# Patient Record
Sex: Female | Born: 1955 | Race: Black or African American | Hispanic: No | Smoking: Never smoker
Health system: Southern US, Community
[De-identification: ages and names within clinical notes are randomized; demographics above are authoritative.]

## PROBLEM LIST (undated history)

## (undated) ENCOUNTER — Ambulatory Visit (HOSPITAL_COMMUNITY): Source: Home / Self Care

## (undated) DIAGNOSIS — IMO0001 Reserved for inherently not codable concepts without codable children: Secondary | ICD-10-CM

## (undated) DIAGNOSIS — F329 Major depressive disorder, single episode, unspecified: Secondary | ICD-10-CM

## (undated) DIAGNOSIS — I2510 Atherosclerotic heart disease of native coronary artery without angina pectoris: Secondary | ICD-10-CM

## (undated) DIAGNOSIS — I219 Acute myocardial infarction, unspecified: Secondary | ICD-10-CM

## (undated) DIAGNOSIS — I10 Essential (primary) hypertension: Secondary | ICD-10-CM

## (undated) DIAGNOSIS — Z531 Procedure and treatment not carried out because of patient's decision for reasons of belief and group pressure: Secondary | ICD-10-CM

## (undated) DIAGNOSIS — K922 Gastrointestinal hemorrhage, unspecified: Secondary | ICD-10-CM

## (undated) DIAGNOSIS — F32A Depression, unspecified: Secondary | ICD-10-CM

## (undated) HISTORY — PX: BACK SURGERY: SHX140

## (undated) HISTORY — PX: ABDOMINAL HYSTERECTOMY: SHX81

---

## 2004-04-09 ENCOUNTER — Inpatient Hospital Stay (HOSPITAL_COMMUNITY): Admission: EM | Admit: 2004-04-09 | Discharge: 2004-04-10 | Payer: Self-pay | Admitting: *Deleted

## 2004-04-10 ENCOUNTER — Inpatient Hospital Stay (HOSPITAL_COMMUNITY): Admission: RE | Admit: 2004-04-10 | Discharge: 2004-04-13 | Payer: Self-pay | Admitting: Psychiatry

## 2004-04-10 ENCOUNTER — Encounter: Payer: Self-pay | Admitting: Cardiology

## 2004-04-16 ENCOUNTER — Ambulatory Visit (HOSPITAL_COMMUNITY): Admission: RE | Admit: 2004-04-16 | Discharge: 2004-04-16 | Payer: Self-pay | Admitting: Cardiovascular Disease

## 2004-04-19 ENCOUNTER — Emergency Department (HOSPITAL_COMMUNITY): Admission: EM | Admit: 2004-04-19 | Discharge: 2004-04-19 | Payer: Self-pay | Admitting: Emergency Medicine

## 2004-05-15 ENCOUNTER — Ambulatory Visit: Payer: Self-pay | Admitting: Internal Medicine

## 2004-05-22 ENCOUNTER — Ambulatory Visit (HOSPITAL_COMMUNITY): Admission: RE | Admit: 2004-05-22 | Discharge: 2004-05-22 | Payer: Self-pay | Admitting: Family Medicine

## 2004-07-02 ENCOUNTER — Emergency Department (HOSPITAL_COMMUNITY): Admission: EM | Admit: 2004-07-02 | Discharge: 2004-07-03 | Payer: Self-pay | Admitting: Emergency Medicine

## 2004-08-12 ENCOUNTER — Encounter: Admission: RE | Admit: 2004-08-12 | Discharge: 2004-08-12 | Payer: Self-pay | Admitting: Family Medicine

## 2004-11-05 ENCOUNTER — Encounter: Admission: RE | Admit: 2004-11-05 | Discharge: 2005-02-03 | Payer: Self-pay | Admitting: Orthopaedic Surgery

## 2004-12-25 ENCOUNTER — Emergency Department (HOSPITAL_COMMUNITY): Admission: EM | Admit: 2004-12-25 | Discharge: 2004-12-25 | Payer: Self-pay | Admitting: Emergency Medicine

## 2005-02-24 ENCOUNTER — Ambulatory Visit (HOSPITAL_COMMUNITY): Admission: RE | Admit: 2005-02-24 | Discharge: 2005-02-24 | Payer: Self-pay | Admitting: Gastroenterology

## 2005-03-11 ENCOUNTER — Encounter
Admission: RE | Admit: 2005-03-11 | Discharge: 2005-06-09 | Payer: Self-pay | Admitting: Physical Medicine and Rehabilitation

## 2005-03-11 ENCOUNTER — Ambulatory Visit: Payer: Self-pay | Admitting: Physical Medicine and Rehabilitation

## 2005-04-08 ENCOUNTER — Ambulatory Visit: Payer: Self-pay | Admitting: Physical Medicine & Rehabilitation

## 2005-05-06 ENCOUNTER — Encounter
Admission: RE | Admit: 2005-05-06 | Discharge: 2005-06-21 | Payer: Self-pay | Admitting: Physical Medicine and Rehabilitation

## 2005-05-31 ENCOUNTER — Emergency Department (HOSPITAL_COMMUNITY): Admission: EM | Admit: 2005-05-31 | Discharge: 2005-05-31 | Payer: Self-pay | Admitting: Emergency Medicine

## 2005-06-04 ENCOUNTER — Ambulatory Visit: Payer: Self-pay | Admitting: Physical Medicine and Rehabilitation

## 2005-07-06 ENCOUNTER — Encounter
Admission: RE | Admit: 2005-07-06 | Discharge: 2005-10-04 | Payer: Self-pay | Admitting: Physical Medicine and Rehabilitation

## 2005-07-15 ENCOUNTER — Ambulatory Visit: Payer: Self-pay | Admitting: Physical Medicine and Rehabilitation

## 2005-07-16 ENCOUNTER — Ambulatory Visit (HOSPITAL_COMMUNITY)
Admission: RE | Admit: 2005-07-16 | Discharge: 2005-07-16 | Payer: Self-pay | Admitting: Physical Medicine and Rehabilitation

## 2005-07-29 ENCOUNTER — Encounter
Admission: RE | Admit: 2005-07-29 | Discharge: 2005-08-30 | Payer: Self-pay | Admitting: Physical Medicine and Rehabilitation

## 2005-08-13 ENCOUNTER — Encounter: Admission: RE | Admit: 2005-08-13 | Discharge: 2005-08-13 | Payer: Self-pay | Admitting: Internal Medicine

## 2005-08-14 ENCOUNTER — Ambulatory Visit (HOSPITAL_COMMUNITY)
Admission: RE | Admit: 2005-08-14 | Discharge: 2005-08-14 | Payer: Self-pay | Admitting: Physical Medicine and Rehabilitation

## 2005-08-26 ENCOUNTER — Ambulatory Visit: Payer: Self-pay | Admitting: Physical Medicine and Rehabilitation

## 2005-08-29 ENCOUNTER — Ambulatory Visit (HOSPITAL_COMMUNITY)
Admission: RE | Admit: 2005-08-29 | Discharge: 2005-08-29 | Payer: Self-pay | Admitting: Physical Medicine and Rehabilitation

## 2005-10-05 ENCOUNTER — Encounter
Admission: RE | Admit: 2005-10-05 | Discharge: 2006-01-03 | Payer: Self-pay | Admitting: Physical Medicine and Rehabilitation

## 2005-10-05 ENCOUNTER — Ambulatory Visit: Payer: Self-pay | Admitting: Physical Medicine and Rehabilitation

## 2005-11-08 ENCOUNTER — Ambulatory Visit: Payer: Self-pay | Admitting: Physical Medicine and Rehabilitation

## 2005-12-14 ENCOUNTER — Encounter
Admission: RE | Admit: 2005-12-14 | Discharge: 2006-01-06 | Payer: Self-pay | Admitting: Physical Medicine and Rehabilitation

## 2005-12-31 ENCOUNTER — Encounter
Admission: RE | Admit: 2005-12-31 | Discharge: 2006-03-31 | Payer: Self-pay | Admitting: Physical Medicine and Rehabilitation

## 2006-01-20 ENCOUNTER — Ambulatory Visit: Payer: Self-pay | Admitting: Physical Medicine and Rehabilitation

## 2006-01-27 ENCOUNTER — Encounter
Admission: RE | Admit: 2006-01-27 | Discharge: 2006-04-27 | Payer: Self-pay | Admitting: Physical Medicine and Rehabilitation

## 2006-03-15 ENCOUNTER — Ambulatory Visit: Payer: Self-pay | Admitting: Physical Medicine and Rehabilitation

## 2006-03-24 ENCOUNTER — Emergency Department (HOSPITAL_COMMUNITY): Admission: EM | Admit: 2006-03-24 | Discharge: 2006-03-24 | Payer: Self-pay | Admitting: Internal Medicine

## 2006-04-22 ENCOUNTER — Encounter: Admission: RE | Admit: 2006-04-22 | Discharge: 2006-07-21 | Payer: Self-pay | Admitting: Anesthesiology

## 2006-05-24 ENCOUNTER — Encounter
Admission: RE | Admit: 2006-05-24 | Discharge: 2006-08-22 | Payer: Self-pay | Admitting: Physical Medicine and Rehabilitation

## 2006-05-24 ENCOUNTER — Ambulatory Visit: Payer: Self-pay | Admitting: Physical Medicine and Rehabilitation

## 2006-05-31 ENCOUNTER — Ambulatory Visit (HOSPITAL_COMMUNITY)
Admission: RE | Admit: 2006-05-31 | Discharge: 2006-05-31 | Payer: Self-pay | Admitting: Physical Medicine and Rehabilitation

## 2006-06-15 ENCOUNTER — Ambulatory Visit: Payer: Self-pay | Admitting: Psychology

## 2006-06-15 ENCOUNTER — Encounter
Admission: RE | Admit: 2006-06-15 | Discharge: 2006-09-13 | Payer: Self-pay | Admitting: Physical Medicine and Rehabilitation

## 2006-07-18 ENCOUNTER — Ambulatory Visit: Payer: Self-pay | Admitting: Physical Medicine and Rehabilitation

## 2006-08-20 ENCOUNTER — Emergency Department (HOSPITAL_COMMUNITY): Admission: EM | Admit: 2006-08-20 | Discharge: 2006-08-20 | Payer: Self-pay | Admitting: Emergency Medicine

## 2006-08-25 ENCOUNTER — Encounter
Admission: RE | Admit: 2006-08-25 | Discharge: 2006-11-23 | Payer: Self-pay | Admitting: Physical Medicine and Rehabilitation

## 2006-09-08 ENCOUNTER — Ambulatory Visit: Payer: Self-pay | Admitting: Physical Medicine and Rehabilitation

## 2006-09-27 ENCOUNTER — Ambulatory Visit (HOSPITAL_COMMUNITY): Admission: RE | Admit: 2006-09-27 | Discharge: 2006-09-27 | Payer: Self-pay | Admitting: Neurosurgery

## 2006-10-05 ENCOUNTER — Encounter
Admission: RE | Admit: 2006-10-05 | Discharge: 2007-01-03 | Payer: Self-pay | Admitting: Physical Medicine and Rehabilitation

## 2006-11-29 ENCOUNTER — Ambulatory Visit: Payer: Self-pay | Admitting: Physical Medicine and Rehabilitation

## 2006-12-15 ENCOUNTER — Encounter: Admission: RE | Admit: 2006-12-15 | Discharge: 2006-12-15 | Payer: Self-pay | Admitting: Internal Medicine

## 2006-12-28 ENCOUNTER — Encounter
Admission: RE | Admit: 2006-12-28 | Discharge: 2007-03-28 | Payer: Self-pay | Admitting: Physical Medicine and Rehabilitation

## 2007-01-20 ENCOUNTER — Ambulatory Visit: Payer: Self-pay | Admitting: Physical Medicine and Rehabilitation

## 2007-05-06 ENCOUNTER — Emergency Department (HOSPITAL_COMMUNITY): Admission: EM | Admit: 2007-05-06 | Discharge: 2007-05-06 | Payer: Self-pay | Admitting: Family Medicine

## 2007-12-17 ENCOUNTER — Emergency Department (HOSPITAL_COMMUNITY): Admission: EM | Admit: 2007-12-17 | Discharge: 2007-12-17 | Payer: Self-pay | Admitting: Family Medicine

## 2007-12-20 ENCOUNTER — Encounter
Admission: RE | Admit: 2007-12-20 | Discharge: 2007-12-25 | Payer: Self-pay | Admitting: Physical Medicine and Rehabilitation

## 2007-12-25 ENCOUNTER — Ambulatory Visit: Payer: Self-pay | Admitting: Physical Medicine and Rehabilitation

## 2008-04-06 ENCOUNTER — Emergency Department (HOSPITAL_COMMUNITY): Admission: EM | Admit: 2008-04-06 | Discharge: 2008-04-06 | Payer: Self-pay | Admitting: Emergency Medicine

## 2008-12-25 ENCOUNTER — Emergency Department (HOSPITAL_COMMUNITY): Admission: EM | Admit: 2008-12-25 | Discharge: 2008-12-25 | Payer: Self-pay | Admitting: Emergency Medicine

## 2009-07-08 ENCOUNTER — Encounter (INDEPENDENT_AMBULATORY_CARE_PROVIDER_SITE_OTHER): Payer: Self-pay | Admitting: Internal Medicine

## 2009-07-08 ENCOUNTER — Inpatient Hospital Stay (HOSPITAL_COMMUNITY): Admission: EM | Admit: 2009-07-08 | Discharge: 2009-07-08 | Payer: Self-pay | Admitting: Emergency Medicine

## 2009-07-08 ENCOUNTER — Ambulatory Visit: Payer: Self-pay | Admitting: Cardiology

## 2009-07-09 ENCOUNTER — Telehealth (INDEPENDENT_AMBULATORY_CARE_PROVIDER_SITE_OTHER): Payer: Self-pay | Admitting: *Deleted

## 2009-07-10 ENCOUNTER — Ambulatory Visit: Payer: Self-pay

## 2009-07-10 ENCOUNTER — Encounter (HOSPITAL_COMMUNITY): Admission: RE | Admit: 2009-07-10 | Discharge: 2009-09-11 | Payer: Self-pay | Admitting: Cardiology

## 2009-07-10 ENCOUNTER — Ambulatory Visit: Payer: Self-pay | Admitting: Cardiovascular Disease

## 2009-07-15 ENCOUNTER — Telehealth: Payer: Self-pay | Admitting: Cardiology

## 2009-07-16 ENCOUNTER — Telehealth (INDEPENDENT_AMBULATORY_CARE_PROVIDER_SITE_OTHER): Payer: Self-pay | Admitting: *Deleted

## 2009-11-14 ENCOUNTER — Ambulatory Visit: Payer: Self-pay | Admitting: Vascular Surgery

## 2009-11-14 ENCOUNTER — Encounter (INDEPENDENT_AMBULATORY_CARE_PROVIDER_SITE_OTHER): Payer: Self-pay | Admitting: Internal Medicine

## 2009-11-14 ENCOUNTER — Observation Stay (HOSPITAL_COMMUNITY): Admission: EM | Admit: 2009-11-14 | Discharge: 2009-11-15 | Payer: Self-pay | Admitting: Emergency Medicine

## 2009-11-14 ENCOUNTER — Ambulatory Visit: Payer: Self-pay | Admitting: Cardiovascular Disease

## 2010-04-08 ENCOUNTER — Emergency Department (HOSPITAL_COMMUNITY): Admission: EM | Admit: 2010-04-08 | Discharge: 2010-04-08 | Payer: Self-pay | Admitting: Emergency Medicine

## 2010-09-17 ENCOUNTER — Encounter
Admission: RE | Admit: 2010-09-17 | Discharge: 2010-09-17 | Payer: Self-pay | Source: Home / Self Care | Attending: Internal Medicine | Admitting: Internal Medicine

## 2010-09-20 ENCOUNTER — Emergency Department (HOSPITAL_COMMUNITY)
Admission: EM | Admit: 2010-09-20 | Discharge: 2010-09-20 | Payer: Self-pay | Source: Home / Self Care | Admitting: Emergency Medicine

## 2010-09-28 ENCOUNTER — Encounter
Admission: RE | Admit: 2010-09-28 | Discharge: 2010-09-28 | Payer: Self-pay | Source: Home / Self Care | Attending: Internal Medicine | Admitting: Internal Medicine

## 2010-09-28 LAB — CBC
HCT: 33.8 % — ABNORMAL LOW (ref 36.0–46.0)
Hemoglobin: 10.6 g/dL — ABNORMAL LOW (ref 12.0–15.0)
MCH: 25.4 pg — ABNORMAL LOW (ref 26.0–34.0)
MCHC: 31.4 g/dL (ref 30.0–36.0)
MCV: 81.1 fL (ref 78.0–100.0)
Platelets: 330 10*3/uL (ref 150–400)
RBC: 4.17 MIL/uL (ref 3.87–5.11)
RDW: 15.1 % (ref 11.5–15.5)
WBC: 5.5 10*3/uL (ref 4.0–10.5)

## 2010-09-28 LAB — BASIC METABOLIC PANEL
BUN: 13 mg/dL (ref 6–23)
CO2: 29 mEq/L (ref 19–32)
Calcium: 9.1 mg/dL (ref 8.4–10.5)
Chloride: 104 mEq/L (ref 96–112)
Creatinine, Ser: 0.87 mg/dL (ref 0.4–1.2)
GFR calc Af Amer: >60 mL/min (ref >60)
GFR calc non Af Amer: >60 mL/min (ref >60)
Glucose, Bld: 85 mg/dL (ref 70–99)
Potassium: 4.1 mEq/L (ref 3.5–5.1)
Sodium: 140 mEq/L (ref 135–145)

## 2010-09-28 LAB — DIFFERENTIAL
Basophils Absolute: 0 10*3/uL (ref 0.0–0.1)
Basophils Relative: 0 % (ref 0–1)
Eosinophils Absolute: 0.2 10*3/uL (ref 0.0–0.7)
Eosinophils Relative: 3 % (ref 0–5)
Lymphocytes Relative: 42 % (ref 12–46)
Lymphs Abs: 2.3 10*3/uL (ref 0.7–4.0)
Monocytes Absolute: 0.4 10*3/uL (ref 0.1–1.0)
Monocytes Relative: 7 % (ref 3–12)
Neutro Abs: 2.6 10*3/uL (ref 1.7–7.7)
Neutrophils Relative %: 48 % (ref 43–77)

## 2010-09-28 LAB — URINALYSIS, ROUTINE W REFLEX MICROSCOPIC
Bilirubin Urine: NEGATIVE
Hgb urine dipstick: NEGATIVE
Ketones, ur: NEGATIVE mg/dL
Nitrite: NEGATIVE
Protein, ur: NEGATIVE mg/dL
Specific Gravity, Urine: 1.016 (ref 1.005–1.030)
Urine Glucose, Fasting: NEGATIVE mg/dL
Urobilinogen, UA: 0.2 mg/dL (ref 0.0–1.0)
pH: 6 (ref 5.0–8.0)

## 2010-09-28 LAB — POCT CARDIAC MARKERS
CKMB, poc: 1.3 ng/mL (ref 1.0–8.0)
CKMB, poc: <1 ng/mL — ABNORMAL LOW (ref 1.0–8.0)
Myoglobin, poc: 86.2 ng/mL (ref 12–200)
Myoglobin, poc: 62.5 ng/mL (ref 12–200)
Troponin i, poc: <0.05 ng/mL (ref 0.00–0.09)
Troponin i, poc: <0.05 ng/mL (ref 0.00–0.09)

## 2010-10-04 ENCOUNTER — Encounter: Payer: Self-pay | Admitting: Internal Medicine

## 2010-10-14 ENCOUNTER — Encounter: Payer: Self-pay | Admitting: Internal Medicine

## 2010-11-28 LAB — CBC
HCT: 34.6 % — ABNORMAL LOW (ref 36.0–46.0)
MCHC: 32.9 g/dL (ref 30.0–36.0)

## 2010-11-28 LAB — POCT I-STAT, CHEM 8
Calcium, Ion: 1.14 mmol/L (ref 1.12–1.32)
Hemoglobin: 13.3 g/dL (ref 12.0–15.0)
Potassium: 3.2 mEq/L — ABNORMAL LOW (ref 3.5–5.1)
Sodium: 142 mEq/L (ref 135–145)
TCO2: 25 mmol/L (ref 0–100)

## 2010-11-28 LAB — POCT CARDIAC MARKERS
Myoglobin, poc: 78.7 ng/mL (ref 12–200)
Troponin i, poc: <0.05 ng/mL (ref 0.00–0.09)
Troponin i, poc: <0.05 ng/mL (ref 0.00–0.09)

## 2010-11-28 LAB — DIFFERENTIAL
Eosinophils Relative: 3 % (ref 0–5)
Lymphs Abs: 2.5 10*3/uL (ref 0.7–4.0)
Monocytes Relative: 6 % (ref 3–12)
Neutrophils Relative %: 48 % (ref 43–77)

## 2010-12-06 LAB — CBC
HCT: 36.5 % (ref 36.0–46.0)
HCT: 38 % (ref 36.0–46.0)
Hemoglobin: 12.3 g/dL (ref 12.0–15.0)
Hemoglobin: 13 g/dL (ref 12.0–15.0)
Hemoglobin: 13.2 g/dL (ref 12.0–15.0)
MCHC: 34.1 g/dL (ref 30.0–36.0)
MCV: 84.4 fL (ref 78.0–100.0)
MCV: 84.6 fL (ref 78.0–100.0)
Platelets: 264 10*3/uL (ref 150–400)
Platelets: 298 10*3/uL (ref 150–400)
RBC: 4.5 MIL/uL (ref 3.87–5.11)
RBC: 4.65 MIL/uL (ref 3.87–5.11)
RDW: 13.6 % (ref 11.5–15.5)
WBC: 5.1 10*3/uL (ref 4.0–10.5)
WBC: 5.3 10*3/uL (ref 4.0–10.5)
WBC: 6.5 10*3/uL (ref 4.0–10.5)

## 2010-12-06 LAB — COMPREHENSIVE METABOLIC PANEL
ALT: 19 U/L (ref 0–35)
ALT: 20 U/L (ref 0–35)
AST: 22 U/L (ref 0–37)
AST: 24 U/L (ref 0–37)
Albumin: 3.2 g/dL — ABNORMAL LOW (ref 3.5–5.2)
Albumin: 3.6 g/dL (ref 3.5–5.2)
Alkaline Phosphatase: 59 U/L (ref 39–117)
Alkaline Phosphatase: 70 U/L (ref 39–117)
Alkaline Phosphatase: 70 U/L (ref 39–117)
BUN: 10 mg/dL (ref 6–23)
BUN: 9 mg/dL (ref 6–23)
CO2: 24 mEq/L (ref 19–32)
CO2: 26 mEq/L (ref 19–32)
Calcium: 9 mg/dL (ref 8.4–10.5)
Chloride: 107 mEq/L (ref 96–112)
Chloride: 108 mEq/L (ref 96–112)
Creatinine, Ser: 0.73 mg/dL (ref 0.4–1.2)
Creatinine, Ser: 0.91 mg/dL (ref 0.4–1.2)
GFR calc Af Amer: >60 mL/min (ref >60)
GFR calc non Af Amer: >60 mL/min (ref >60)
GFR calc non Af Amer: >60 mL/min (ref >60)
GFR calc non Af Amer: >60 mL/min (ref >60)
Glucose, Bld: 101 mg/dL — ABNORMAL HIGH (ref 70–99)
Glucose, Bld: 87 mg/dL (ref 70–99)
Glucose, Bld: 91 mg/dL (ref 70–99)
Potassium: 3.5 mEq/L (ref 3.5–5.1)
Potassium: 3.6 mEq/L (ref 3.5–5.1)
Potassium: 3.9 mEq/L (ref 3.5–5.1)
Sodium: 140 mEq/L (ref 135–145)
Sodium: 141 mEq/L (ref 135–145)
Total Bilirubin: 0.5 mg/dL (ref 0.3–1.2)
Total Bilirubin: 0.6 mg/dL (ref 0.3–1.2)
Total Protein: 7.1 g/dL (ref 6.0–8.3)

## 2010-12-06 LAB — URINALYSIS, ROUTINE W REFLEX MICROSCOPIC
Bilirubin Urine: NEGATIVE
Glucose, UA: NEGATIVE mg/dL
Hgb urine dipstick: NEGATIVE
Ketones, ur: NEGATIVE mg/dL
Nitrite: NEGATIVE
Protein, ur: NEGATIVE mg/dL
Specific Gravity, Urine: 1.017 (ref 1.005–1.030)
Urobilinogen, UA: 0.2 mg/dL (ref 0.0–1.0)
pH: 6.5 (ref 5.0–8.0)

## 2010-12-06 LAB — DIFFERENTIAL
Basophils Absolute: 0 10*3/uL (ref 0.0–0.1)
Basophils Absolute: 0.1 10*3/uL (ref 0.0–0.1)
Basophils Relative: 0 % (ref 0–1)
Basophils Relative: 1 % (ref 0–1)
Basophils Relative: 1 % (ref 0–1)
Eosinophils Absolute: 0.2 10*3/uL (ref 0.0–0.7)
Eosinophils Absolute: 0.2 10*3/uL (ref 0.0–0.7)
Eosinophils Relative: 3 % (ref 0–5)
Eosinophils Relative: 3 % (ref 0–5)
Lymphocytes Relative: 44 % (ref 12–46)
Lymphocytes Relative: 55 % — ABNORMAL HIGH (ref 12–46)
Lymphs Abs: 2.9 10*3/uL (ref 0.7–4.0)
Monocytes Absolute: 0.4 10*3/uL (ref 0.1–1.0)
Monocytes Absolute: 0.4 10*3/uL (ref 0.1–1.0)
Monocytes Relative: 7 % (ref 3–12)
Monocytes Relative: 7 % (ref 3–12)
Neutro Abs: 1.6 10*3/uL — ABNORMAL LOW (ref 1.7–7.7)
Neutro Abs: 2.9 10*3/uL (ref 1.7–7.7)
Neutrophils Relative %: 32 % — ABNORMAL LOW (ref 43–77)
Neutrophils Relative %: 45 % (ref 43–77)
Neutrophils Relative %: 50 % (ref 43–77)

## 2010-12-06 LAB — SEDIMENTATION RATE: Sed Rate: 15 mm/hr (ref 0–22)

## 2010-12-06 LAB — POCT CARDIAC MARKERS: Myoglobin, poc: 75.6 ng/mL (ref 12–200)

## 2010-12-06 LAB — FOLATE RBC: RBC Folate: 890 ng/mL — ABNORMAL HIGH (ref 180–600)

## 2010-12-06 LAB — VITAMIN B12: Vitamin B-12: 608 pg/mL (ref 211–911)

## 2010-12-06 LAB — MAGNESIUM: Magnesium: 2 mg/dL (ref 1.5–2.5)

## 2010-12-06 LAB — ANA: Anti Nuclear Antibody(ANA): NEGATIVE

## 2010-12-17 LAB — CBC
HCT: 37.2 % (ref 36.0–46.0)
Hemoglobin: 13.8 g/dL (ref 12.0–15.0)
Platelets: 228 10*3/uL (ref 150–400)
RBC: 4.87 MIL/uL (ref 3.87–5.11)
RDW: 14.9 % (ref 11.5–15.5)
WBC: 6.1 10*3/uL (ref 4.0–10.5)
WBC: 6.3 10*3/uL (ref 4.0–10.5)

## 2010-12-17 LAB — CK TOTAL AND CKMB (NOT AT ARMC)
CK, MB: 1.8 ng/mL (ref 0.3–4.0)
Relative Index: 0.9 (ref 0.0–2.5)
Relative Index: 1 (ref 0.0–2.5)

## 2010-12-17 LAB — DIFFERENTIAL
Basophils Relative: 0 % (ref 0–1)
Lymphocytes Relative: 41 % (ref 12–46)
Lymphs Abs: 2.6 10*3/uL (ref 0.7–4.0)
Monocytes Absolute: 0.5 10*3/uL (ref 0.1–1.0)
Monocytes Relative: 7 % (ref 3–12)
Neutro Abs: 3 10*3/uL (ref 1.7–7.7)
Neutrophils Relative %: 47 % (ref 43–77)

## 2010-12-17 LAB — LIPID PANEL
Cholesterol: 199 mg/dL (ref 0–200)
LDL Cholesterol: 139 mg/dL — ABNORMAL HIGH (ref 0–99)
Triglycerides: 71 mg/dL (ref <150)
VLDL: 14 mg/dL (ref 0–40)

## 2010-12-17 LAB — POCT I-STAT, CHEM 8
Calcium, Ion: 1.09 mmol/L — ABNORMAL LOW (ref 1.12–1.32)
Chloride: 107 mEq/L (ref 96–112)
Glucose, Bld: 99 mg/dL (ref 70–99)
HCT: 44 % (ref 36.0–46.0)
Hemoglobin: 15 g/dL (ref 12.0–15.0)
Potassium: 3.6 mEq/L (ref 3.5–5.1)

## 2010-12-17 LAB — COMPREHENSIVE METABOLIC PANEL
AST: 18 U/L (ref 0–37)
Albumin: 3.4 g/dL — ABNORMAL LOW (ref 3.5–5.2)
BUN: 11 mg/dL (ref 6–23)
Calcium: 8.9 mg/dL (ref 8.4–10.5)
Creatinine, Ser: 0.75 mg/dL (ref 0.4–1.2)
GFR calc Af Amer: >60 mL/min (ref >60)
Total Protein: 6.6 g/dL (ref 6.0–8.3)

## 2010-12-17 LAB — URINALYSIS, ROUTINE W REFLEX MICROSCOPIC
Bilirubin Urine: NEGATIVE
Hgb urine dipstick: NEGATIVE
Nitrite: NEGATIVE
Protein, ur: NEGATIVE mg/dL
Specific Gravity, Urine: 1.024 (ref 1.005–1.030)
Urobilinogen, UA: 1 mg/dL (ref 0.0–1.0)

## 2010-12-17 LAB — POCT CARDIAC MARKERS
CKMB, poc: 1.6 ng/mL (ref 1.0–8.0)
Myoglobin, poc: 81.6 ng/mL (ref 12–200)
Troponin i, poc: <0.05 ng/mL (ref 0.00–0.09)

## 2010-12-17 LAB — TSH: TSH: 3.134 u[IU]/mL (ref 0.350–4.500)

## 2010-12-17 LAB — TROPONIN I
Troponin I: 0.01 ng/mL (ref 0.00–0.06)
Troponin I: 0.02 ng/mL (ref 0.00–0.06)

## 2010-12-17 LAB — HEMOGLOBIN A1C: Mean Plasma Glucose: 120 mg/dL

## 2010-12-19 ENCOUNTER — Emergency Department (HOSPITAL_COMMUNITY): Payer: Medicare Other

## 2010-12-19 ENCOUNTER — Emergency Department (HOSPITAL_COMMUNITY)
Admission: EM | Admit: 2010-12-19 | Discharge: 2010-12-19 | Disposition: A | Discharge status: Home / Self Care | Payer: Medicare Other | Attending: Emergency Medicine | Admitting: Emergency Medicine

## 2010-12-19 DIAGNOSIS — R079 Chest pain, unspecified: Secondary | ICD-10-CM | POA: Insufficient documentation

## 2010-12-19 DIAGNOSIS — R42 Dizziness and giddiness: Secondary | ICD-10-CM | POA: Insufficient documentation

## 2010-12-19 DIAGNOSIS — Z79899 Other long term (current) drug therapy: Secondary | ICD-10-CM | POA: Insufficient documentation

## 2010-12-19 DIAGNOSIS — R0602 Shortness of breath: Secondary | ICD-10-CM | POA: Insufficient documentation

## 2010-12-19 DIAGNOSIS — IMO0001 Reserved for inherently not codable concepts without codable children: Secondary | ICD-10-CM | POA: Insufficient documentation

## 2010-12-19 DIAGNOSIS — Z9889 Other specified postprocedural states: Secondary | ICD-10-CM | POA: Insufficient documentation

## 2010-12-19 DIAGNOSIS — M79609 Pain in unspecified limb: Secondary | ICD-10-CM | POA: Insufficient documentation

## 2010-12-19 DIAGNOSIS — I252 Old myocardial infarction: Secondary | ICD-10-CM | POA: Insufficient documentation

## 2010-12-19 DIAGNOSIS — R209 Unspecified disturbances of skin sensation: Secondary | ICD-10-CM | POA: Insufficient documentation

## 2010-12-19 DIAGNOSIS — I10 Essential (primary) hypertension: Secondary | ICD-10-CM | POA: Insufficient documentation

## 2010-12-19 LAB — CBC
HCT: 37.9 % (ref 36.0–46.0)
Platelets: 344 10*3/uL (ref 150–400)
RBC: 4.63 MIL/uL (ref 3.87–5.11)
RDW: 15.4 % (ref 11.5–15.5)
WBC: 4.5 10*3/uL (ref 4.0–10.5)

## 2010-12-19 LAB — BASIC METABOLIC PANEL
CO2: 25 mEq/L (ref 19–32)
Calcium: 9.2 mg/dL (ref 8.4–10.5)
Creatinine, Ser: 0.77 mg/dL (ref 0.4–1.2)
GFR calc Af Amer: >60 mL/min (ref >60)
GFR calc non Af Amer: >60 mL/min (ref >60)
Glucose, Bld: 104 mg/dL — ABNORMAL HIGH (ref 70–99)

## 2010-12-19 LAB — POCT CARDIAC MARKERS
CKMB, poc: 1 ng/mL (ref 1.0–8.0)
Myoglobin, poc: 82.6 ng/mL (ref 12–200)
Troponin i, poc: <0.05 ng/mL (ref 0.00–0.09)

## 2010-12-19 LAB — DIFFERENTIAL
Basophils Absolute: 0 10*3/uL (ref 0.0–0.1)
Eosinophils Relative: 3 % (ref 0–5)
Lymphocytes Relative: 42 % (ref 12–46)
Lymphs Abs: 1.9 10*3/uL (ref 0.7–4.0)
Neutrophils Relative %: 47 % (ref 43–77)

## 2010-12-30 NOTE — Consult Note (Signed)
NAME:  Tanya Walsh, Tanya Walsh NO.:  [id number]  MEDICAL RECORD NO.:  [id number]           PATIENT TYPE:  E  LOCATION:  [facility]                         FACILITY:  [facility]  PHYSICIAN:  Derell Bruun C. Leasha Goldberger, MD, FACCDATE OF BIRTH:  04-09-1956  DATE OF CONSULTATION:  12/19/2010 DATE OF DISCHARGE:  12/19/2010                                CONSULTATION   PRIMARY CARDIOLOGIST:  Had been Dr. Valera Castle in the past.  PRIMARY CARE PHYSICIAN:  Fleet Contras, MD  REASON FOR CONSULTATION:  Chest pain.  HISTORY OF PRESENT ILLNESS:  This is a 55 year old African American female with no prior cardiac history; however, she has been seen in our office in the past and has had two stress tests one in 2010 and one in 2005, which have been negative.  The patient has also a history of hypertension, GERD, chronic low back pain, and fibromyalgia.  The patient came to the emergency room after having sharp substernal chest pain radiating to the left breast.  She also complains of constant heaviness in her chest that has been there for a long time.  She states that when she got up to use the bathroom this morning, then the sharp chest pain was severe, but only lasted a couple of seconds.  She states that she has chronic pain and heaviness in her chest, which is worse with movement and goes away with rest.  The chest heaviness remains there all the time.  She also complained of a transient headache and facial numbness on the left.  The symptoms are almost exactly the same as prior admission in October 2010 and November 2011, all of which have been ruled out for myocardial infarction or cardiac etiology at that time.  On arrival, the patient's blood pressure was 151/80, heart rate 57, respirations 14.  She was given sublingual nitroglycerin and aspirin and the pain has been completely relieved.  REVIEW OF SYSTEMS:  Positive for transient sharp left-sided chest pain, constant heaviness in her chest and  back with headache and left-sided facial numbness.  All other systems are reviewed and are found to be negative.  CODE STATUS:  Full code.  PAST MEDICAL HISTORY: 1. Fibromyalgia. 2. Hypertension. 3. CAD. 4. GERD. 5. Chronic low back pain. 6. Migraine headaches. 7. Questionable MI in 2002. 8. She has had a stress Myoview most recently documented in 2010,     which was normal.  Echocardiogram revealing EF of 45-50% with some     mild LVH.  PAST SURGICAL HISTORY:  Fusion of the L4-S1 and anterolisthesis of the L4-L5, hysterectomy and tonsillectomy.  SOCIAL HISTORY:  She lives in [place] with her daughter.  She is disabled secondary to chronic pain and back pain.  She is widowed. Negative for cigarettes, EtOH, or alcohol.  FAMILY HISTORY:  Mother deceased from brain cancer.  Father deceased from complications of chronic kidney disease.  She has one sister with hypertension and one brother with CAD.  MEDICATIONS PRIOR TO ADMISSION:  She does not have the doses, but she takes Lopressor, iron, hydrochlorothiazide, potassium, and aspirin.  ALLERGIES:  No known drug allergies.  CURRENT  LABORATORY DATA:  Sodium 138, potassium 3.6, chloride 104, CO2 25, BUN 9, creatinine 0.77.  Hemoglobin 12.3, hematocrit 37.9, white blood cells 4.5, platelets 314.  D-dimer 0.28, troponin 0.05 and 0.05. Current EKG revealing normal sinus rhythm, rate of 54 beats per minute.  Chest x-ray, no acute abnormalities.  PHYSICAL EXAMINATION:  VITAL SIGNS:  Blood pressure 129/85, pulse 56, respirations 16, temperature 98.3, O2 sat 97% on room air. GENERAL:  She is awake, alert, and oriented in no acute distress. HEENT:  Head is normocephalic and atraumatic.  Eyes:  PERRLA. NECK:  Supple without JVD or carotid bruits appreciated. CARDIOVASCULAR:  Regular rate and rhythm without murmurs, rubs or gallops.  Pulses are 2+ and equal bilaterally. LUNGS:  Clear to auscultation without wheezes, rales, or  rhonchi. ABDOMEN:  Soft, nontender, 2+ bowel sounds. MUSCULOSKELETAL:  Pain is reproducible on palpation of the left shoulder and movement. NEUROLOGIC:  Cranial nerves II through XII are grossly intact.  IMPRESSION:  Atypical chest pain, transient sharp pain, constant pressure with increased pressure with movement, feels weak all over. The patient has had frequent symptoms of same and has been ruled out for cardiac etiology.  She also has had some associated facial numbness on the left, we would recommend placing her on the PPI as in the past.  She was started on this, which was helpful, but did not continue to take it. Her EKG was normal and her cardiac enzymes are negative.  We will have her follow with her primary care physician Dr. Concepcion Elk and have him make further recommendations for other workup.  In the interim secondary to low normal potassium, I will give her one dose of potassium and give her a prescription for Protonix 40 mg p.o. daily.  She has no need for further cardiac follow up unless Dr. Concepcion Elk has instructed her to do so.    Bettey Mare. Lyman Bishop, NP   ______________________________ Jesse Sans Daleen Squibb, MD, [facility]   KML/MEDQ  D:  12/19/2010  T:  12/19/2010  Job:  [id number]  cc:   Fleet Contras, M.D.  Electronically Signed by Joni Reining NP on 12/21/2010 11:07:45 AM Electronically Signed by Valera Castle MD [facility] on 12/30/2010 08:42:39 AM

## 2011-01-15 ENCOUNTER — Other Ambulatory Visit: Payer: Self-pay | Admitting: Orthopedic Surgery

## 2011-01-15 DIAGNOSIS — M4310 Spondylolisthesis, site unspecified: Secondary | ICD-10-CM

## 2011-01-18 ENCOUNTER — Ambulatory Visit
Admission: RE | Admit: 2011-01-18 | Discharge: 2011-01-18 | Disposition: A | Discharge status: Home / Self Care | Payer: Medicare Other | Source: Ambulatory Visit | Attending: Orthopedic Surgery | Admitting: Orthopedic Surgery

## 2011-01-18 DIAGNOSIS — M4310 Spondylolisthesis, site unspecified: Secondary | ICD-10-CM

## 2011-01-26 NOTE — Assessment & Plan Note (Signed)
HISTORY:  [name] is a 55 year old African American female who is a  patient of Dr. Marlowe Aschoff.   Mr. Setterlund was referred to our clinic by urgent care facility a couple  weeks ago.  Apparently she had been seen and treated for some right  flank pain and low back pain.  She was given Naprosyn and Prilosec.   Shortly thereafter she developed some swelling in her face and was seen  in the emergency room and Ms. Beason thought she might be having a  reaction from the medication, however, she was diagnosed in the  Emergency Department with facial swelling secondary to dental  caries/dental abscess.   She is back in our clinic today.  The last time she was seen in our  clinic was December 28, 2006, almost a year ago.   She states that her right flank pain and low back pain is much improved.  She has a couple other problems which are bothering her mildly.  She  states that she has some intermittent right elbow pain which is  localized to the medial aspect of her right elbow.  This occurs  occasionally and she also has bilateral knee pain which is worse when  she walks for a long period of time or climbs stairs.   Average pain is currently about a 5 on a scale of 10, interfering  significantly with activity.  Sleep is fair.  Pain is typically with  walking, bending, sitting and standing, improves with rest and  medications.  She gets fair relief with her current medications.   She is currently using Vicodin through Dr. Royden Purl office.  She  believes she takes 5/500 up to twice a day, hydrocodone/acetaminophen.  She also has been prescribed Naproxen which she is currently not taking,  550 one tablet twice a day, Prilosec 40 mg  once a day, Methocarbamol  750 up to three times a day.  She has currently not been taking any of  these medications in the last couple of weeks.  She is also on Prozac 20  mg a day and hydrochlorothiazide.   She is currently not using any medications prescribed from  our office as  she has not been seen in about a year.   FUNCTIONAL STATUS:  She states she is able to walk between 5 to 10  minutes at a time.  She is able to climb stairs.  She is driving.  She  is independent with self care.  She is able to make meals, make her bed.  She spends a good amount of her time reading and watching TV.   REVIEW OF SYSTEMS:  She admits to depression, anxiety, numbness, tremor,  tingling, trouble walking.  Denies bowel or bladder control problems.  Denies suicidal ideation.  Admits to occasional nausea, diarrhea,  constipation and shortness of breath.   She is followed by Dr. Marlowe Aschoff, primary care.   PAST MEDICAL, SOCIAL AND FAMILY HISTORY:  No changes in past medical,  social or family history since she was last seen.  She is widowed and  continues to live with her daughter.   PHYSICAL EXAMINATION:  VITAL SIGNS:  On exam today her blood pressure is  149/96.  She states she did not take her antihypertensives this morning.  Pulse is 64, respirations 18, 99% saturations on room air.  GENERAL:  She is a well-developed, obese, African American female who  does not appear in any distress.   She is oriented x3.  Her  speech is soft but clear.  Her overall affect  is bright and she is alert, cooperative and pleasant.   She transitions from sitting to standing easily.  Her gait in the room  is quite stable, non-antalgic.  Tandem gait and Romberg tests are  performed adequately.   She has good range of motion in her cervical spine.  Full shoulder range  of motion is noted as well.  Mild limitations in lumbar motion are  noted.  She has full range of motion at her knees.   Right elbow is examined.  She has some mild tenderness along the medial  epicondyle.  Range of motion is full at the elbow with rotation.  With  supination and pronation, full motion is noted to wrist and fingers.  She has minimal tenderness without evidence of effusion in both knees.   Tenderness is noted along the medial joint line, not much noted along  the lateral joint lines bilaterally.  No instability is appreciated  medially or laterally.   NEUROLOGIC EXAM:  Cranial nerves are grossly intact.  She has good  balance displayed.  Coordination is grossly intact as well.  Sensation  is intact to light touch in the upper extremities.  Motor strength is  5/5 in the upper and lower extremities without focal deficits.  Reflexes  are 2+ at the patellar and Achilles' tendons.  2+ at the biceps, triceps  and brachioradialis bilaterally as well.   IMPRESSION:  1. History of lumbago, currently not a major problem for her at this      time.  2. New onset intermittent right medial elbow pain consistent with      medial epicondylitis.  3. Bilateral intermittent knee pain, worse with activity, most likely      mild osteoarthritis.   PLAN:  Encouraged continued use of Naproxen in addition to the Prilosec  intermittently to help manage the tendinitis as well as the knee pain.   I would like to see her more active.  I have strongly recommended that  she get involved in a pool program for patients with arthritis.  Recommend that she use a cane intermittently, especially on uneven  surfaces.   Also recommend icing and some stretching exercises for the right elbow.  Will see her back in a month.  If she continues to have some right elbow  pain, may recommend formal therapy program, possibly with the use of  __________  or phonophoresis as well as a stretching and strengthening  program for her.           ______________________________  Brantley Stage, M.D.     DMK/MedQ  D:  12/25/2007 09:22:12  T:  12/25/2007 10:12:16  Job #:  [id number]

## 2011-01-29 NOTE — Assessment & Plan Note (Signed)
HISTORY:  [name] is a 55 year old black female who has been  seen in our pain and rehab clinic for multiple chronic pain complaints  including cervicalgia, left elbow pain, lumbago, bilateral lower extremity  pain.   Her acute complaint today is cervicalgia. She states since she started the  traction, she believes it has helped somewhat.  Her left elbow pain is  better.  Her neck pain has improved somewhat. She is still having headaches,  however.   Average pain is 6 on a scale of 10, described as sharp, burning, stabbing,  tingling in nature.  Interferes only a little bit with her general activity.  Sleep is, however, poor.  The pain can be fairly constant throughout the  day.  She gets a little relief with the current medications that she is on.  She is able to walk about five minutes at a time. She is able to drive.  She  is able to climb stairs, independent with all of her self-care.  Does admit  to some depression, confusion, numbness, tingling, tremors, weakness in her  hands.   No other changes in past medical, social, or family history since our last  visit.   PHYSICAL EXAMINATION:  VITAL SIGNS:  Blood pressure 144/95, pulse 60,  respirations 16, 100% oxygen saturation on room air.  GENERAL:  She is a well-developed, well-nourished, black female who appears  her stated age.  She is oriented x3.  Affect is bright, alert, cooperative,  and pleasant.  NEUROLOGIC:  Transitions from sit to stand without difficulty.  Gait is  normal in the room.  Has some difficulty with tandem gait.  However, Su Hilt  test is only mildly compromised.  Reflexes are symmetric and intact upper  and lower extremities.  Motor strength is good throughout.  No focal  weaknesses noted  Tone is normal throughout both upper and lower  extremities.   IMPRESSION:  1.  Cervicalgia.  2.  Poor balance.  3.  Lumbago.  4.  Left elbow pain most likely related to C6 radiculopathy on the left.   PLAN:  1.  Will refill her medications:  Hydrocodone 5/500 one p.o. t.i.d. p.r.n.      neck pain and headache #90.  2.  Will have her continue traction.  3.  Have her follow up continued in physical therapy for balance program.  4.  She would like to follow up with Dr. Stevphen Rochester for assessment and treatment      cervical medical branch block. 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  5.  I will see her back in a month.  She has been stable on her medications,      takes them appropriately and gets reasonable relief with them.           ______________________________  Brantley Stage, M.D.     DMK/MedQ  D:  02/18/2006 13:09:12  T:  02/19/2006 03:57:05  Job #:  [id number]

## 2011-01-29 NOTE — Assessment & Plan Note (Signed)
[name] is a 55 year old black female who is being seen in our  pain and rehabilitative clinic for multiple pain complaints including  cervicalgia, lumbago, and more recently worsening of some bilateral leg  pain.   She states her leg pain seems to be a little bit worse in the last couple of  weeks.  Her knees are quite achy as well.   She denies any injuries, denies any increase in her activity, denies  problems with diabetes. She does report she had a recent medication change.  She was started on Cymbalta about a week ago.  She thinks that makes her  feel nervous.  She also has been started on Seroquel.   Her pain is described as achy, somewhat burning into the legs.  Her average  pain is about a 7 on a scale to 10. She gets fair relief with the current  medications that she is on.   She is walking about 10-15 minutes a day.  She is able to climb stairs.  She  can drive.   Functionally, she is independent with her self care and higher level  household activities.  However, she spends a good part of the day in bed and  watching TV.   No other changes in past surgical, social, or family history since our last  visit.  Does indicate mother, in her 72s, had a stroke and also died from  brain tumor.   Medications which she takes currently include Wellbutrin, an aspirin a day,  Prevacid, and recently added Cymbalta and Seroquel.   PHYSICAL EXAMINATION:  VITAL SIGNS: Blood pressure 130/81, pulse 77,  respirations 16, 98% saturation on room air.  GENERAL:  She is a mildly obese black female who appears her stated age.  She is oriented x3.  She does not appear in any distress.  Her affect is  somewhat labile.  She becomes tearful during our interview when asked about  her overall function and which activity she currently enjoys.   She is able to transition from sit to stand without any pain behaviors and  without difficulty.  She is somewhat slow, however.  She ambulates  in the  room.  Her gait is stable.  Romberg test is negative.  Tandem gait is  adequate.  She has limitations in cervical range of motion in all planes.  She has limitations in lumbar range of motion.  Reflexes are symmetric and  intact upper and lower extremities.  She reports decreased sensation below  the knees, intact in the upper extremities.  Motor strength is good  throughout, however.  No abnormal tone is noted. No clonus is noted.   Tenderness along predominantly bilateral medial joint lines of both knees.  No fusion appreciated.   IMPRESSION:  1. Bilateral knee osteoarthritis.  2. Cervicalgia.  3. Lumbago.  4. Bilateral lower extremity pain, etiology not clear at this time.   PLAN:  We will have her set up to see Dr. Leonides Cave to further evaluate  concentration, memory, her withdrawal from social situations.  She does  complain today of her concentration and memory and would like to have Dr.  Leonides Cave  evaluate this further.  Will add Neurontin today.  Will start at 300 mg at  night, increase to 3 times a day slowly.  Will decrease Vicodin to 5/500  twice a day.  May consider neurology followup; however, will start with Dr.  Leonides Cave.           ______________________________  Brantley Stage, M.D.     DMK/MedQ  D:  05/25/2006 09:02:09  T:  05/25/2006 15:29:50  Job #:  [id number]   cc:   Gladstone Pih, Ph.D.  [address] [place]  [place] [address]

## 2011-01-29 NOTE — Assessment & Plan Note (Signed)
Tanya Walsh is a 55 year old black female who is being seen in our pain and  rehabilitative clinic for multiple pain complaints, including cervicalgia,  with radiating shoulder pain to the left, as well as lumbago, predominantly  left hip pain laterally, low back pain and especially right lateral elbow  pain.  Her pain in these various areas bothered her at different  intensities, depending on the month.   She has recently had some complaints of diminished cognitive function.  She  was set up to see Dr. Leonides Cave who had planned to do some psychiatric testing  on her.  However, she canceled her appointment last Friday.  He did see her  and I believe she is suffering from depression.  She is currently followed  by Dr. Mariea Stable for her depression and has been trialed on various  medications over the last month or so.  [name] is back in today  and chief complaint is right lateral elbow pain and some left lateral hip  pain.   At the last visit, she had complaints of knee pain and radiographs were done  on 05/31/2006, both right and left knee, moderate osteoarthritis was noted,  and this was read by Hulan Saas, M.D., who felt this was possibly  secondary to CPPD.   [name]'s average pain is about a 6-8 on a scale of 10.  She gets fair  relief with the medications that she is on from our clinic.  She has been on  Neurontin.  This has helped control her left shoulder pain which is felt to  be somewhat neuropathic in nature.  This has gotten better over the last  month since she has been on Neurontin for a total of 900 mg a day.  She is  independent with her self-care for the most part.  She admits to depression,  denies suicidal ideation.  Has some intermittent problems with constipation.   She is currently followed by Dr. Leonides Cave, psychologist, Dr. Mariea Stable, who is  her psychiatrist.  She also has a primary care physician.   On exam today, her blood pressure is 151/90,  pulse 67, respirations 16, 99%  saturated on room air.  She is mildly obese, African-American female, who  appears her stated age.  She is oriented x3.  Her affect is alert.  She is  cooperative and pleasant.  She follows commands without difficulty.  Her  speech is clear.  She does smile somewhat throughout the interview, but she  also becomes somewhat tearful intermittently as well.  She is able to stand  easily after being seated.  Her gait displays a normal stride length,  although it is slow.  She is able to perform tandem gait, but has a minor  bit of trouble with this.  Romberg's test is normal.  She has limited  limitations in cervical range of motion; however, she has full shoulder  range of motion.  Lumbar range is also mildly limited in all planes.  Seated, her reflexes are 1+ in both upper extremities and 2+ of patellar  tendons and 2+ of the Achilles tendons.  There is no abnormal tone noted.  Muscle bulk is within normal limits as well.  She has full strength in both  upper and lower extremities with some verbal coaching.  She tends to give  way otherwise.  She is quite tender along the lateral epicondyle of her  right elbow and has also some significant tenderness along the left  trochanter laterally.  IMPRESSION:  1. Bilateral knee osteoarthritis.  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  2. Cervicalgia with radiating pain to the left shoulder, improved with      Neurontin.  3. Right lateral epicondylitis.  4. Left trochanteric bursitis.  5. Lumbago.  6. Depression.  7. Cognitive deficits and also some other sensory complaints, including      facial numbness and history of complaints of swallowing problems.   PLAN:  Will have patient follow up with [facility] to  assess her facial numbness, swallowing problems, subjective complaints of  poor balance, in this lady who also has a history of a myocardial infarction   and hypertension.   Will also have her follow up with Rheumatology for assessment further  regarding her needs.  The last x-ray of bilateral knees showed possibility  of CPPD.   Will refill her Vicodin 5/500, one p.o. b.i.d. p.r.n. and she has a refill  on her Neurontin 300 mg t.i.d..  Will have her set up in our clinic for  injections of the left trochanter and the right lateral epicondyle.           ______________________________  Brantley Stage, M.D.     DMK/MedQ  D:  07/18/2006 09:35:04  T:  07/18/2006 11:37:57  Job #:  [id number]

## 2011-01-29 NOTE — Assessment & Plan Note (Signed)
MEDICAL RECORD NUMBER:  [id number]   DATE OF BIRTH:  Apr 26, 1956   [name] is a 55 year old pleasant black female who is back into our pain  and rehabilitative clinic for a refill of her Ultram today.   She states that last week she had a fall and might have hit her head; she is  not sure if she did or not. She also is complaining of some lightheadedness  and on initial check-in her blood pressure was noted to be 153/99.   She had been placed on some Naprosyn with a warning that it may elevate her  blood pressure. She was given 10 days apparently. Her primary care physician  has refilled this medication for her in the last couple of weeks as well.   She continues to have complaints of neck and left shoulder pain which  started approximately 5 weeks ago and seems to be not getting any better  and, if anything, a bit worse for her over the last couple of weeks. She  describes her average pain as about a 10 on a scale of 10, sharp, stabbing,  tingling, aching in nature, from the neck to the scapula region, shoulder  region, into the left lateral elbow area. Sleep has been poor. Pain is  exacerbated with activities in general; improves with rest, medication, TENS  unit. She gets fair relief with the current medications she is on right now.  Did report the Naprosyn helped quite a bit.   She occasionally will use a cane or walker. She can walk about 5 minutes at  a time. Her legs get tired. She is able to drive. Admits to intermittent  bladder control problems, weakness, dizziness, confusion, depression,  anxiety, also shortness of breath.   No other changes in past medical, social, or family history since last visit  other than already noted.   Repeat blood pressure was 140/97, pulse 77, respirations 16, 100% saturated  on room air. She is a well-developed, well-nourished woman who appears  slightly younger than her stated age. She is oriented x3. Her affect is  bright, alert,  she is cooperative. She answers questions appropriately.   She is able to stand easily after being seated. Her gait is steady, although  she does have a little bit of trouble with tandem gait. She is unable to  perform it adequately in the office here. Her Romberg's test is performed  adequately, however, Seated, her reflexes are symmetric and intact, no  abnormal tone is noted, toes are downgoing, straight leg raise is negative.  She has good strength in the upper and lower extremities with the exception  of bilateral intrinsics and finger flexors.   Note some numbness in the left lateral arm and into the middle and ring  fingers.   IMPRESSION:  1.  Cervicalgia with left upper extremity pain, numbness, and some weakness.  2.  Poor balance.  3.  History of low back pain, history of depression.  4.  Cardiovascular disease.  5.  Irritable bowel.  6.  Migraines.   PLAN:  Given the fact that she seems to be having some new balance problems  and recent fall, as well as increased neck and arm pain, will obtain a  cervical MRI. Will also have her follow-up with her primary care physician  today in light of the elevated blood pressure and lightheadedness.   Will go ahead and refill her medicines, Ultram 50 mg one to two p.o. b.i.d.  #120,  and will see her back after the MRI scan.           ______________________________  Brantley Stage, M.D.     DMK/MedQ  D:  08/13/2005 13:49:09  T:  08/13/2005 15:47:07  Job #:  [id number]

## 2011-01-29 NOTE — Assessment & Plan Note (Signed)
Monday, August 15, 2006   Ms. Larocca is a 55 year old-black-female who is being seen in our pain  and rehabilitative clinic for multiple pain complaints.  These pain  complaints include cervicalgia, abdominal and left elbow pain, low back  pain, bilateral hip pain, and knee pain.   She is back in today and states that her pain interferes a little with  her activity levels.   Her pain is about an 8/10.  She describes most of her pain as rather  sharp and worse with activity.  Improved with TENS unit.   MEDICATIONS:  She gets little relief with the current meds that she is  on.   She can walk about 20 minutes at a time.  She is able to climb steps.  She drives.  She is able to make some meals for herself at home.  She  walks to the mailbox, spends most of the day watching TV.  She is  learning to crochet.   She is independent with her self care, however.  She denies bowel or  bladder control problems.  Confusion, depression, and anxiety, and  denies suicidal ideations.  Has occasional constipation, but it is  currently not a problem.   PAST MEDICAL HISTORY:  Negative for any new problems.  She is going to  followup with her primary care doctor, Dr. Concepcion Elk for a followup today.  She complains of some intermittent heart racing, and I asked her to  discuss that with him when she sees him this afternoon.   She also has recently followed up with Dr. Leonides Cave.  However, did not  make return visits for cognitive testing.  She states that she will try  to do this after the holidays.   She continues to live with daughter, 2 grand-children, a 1 and a 10-year-  old.  Denies alcohol or smoking.   MEDICATIONS:  Provided by this clinic include Vicodin 5/500 one p.o.  b.i.d. 60 per month, and Neurontin 300 mg 3 times a day.   EXAM:  Blood pressure is 137/86, pulse 88, respirations 16, 97%  saturation on room air.  She is a well-developed, well-nourished black female who appears her  stated  age.  She is oriented x3.  Her affect is bright, alert,  cooperative.  Does not appear in any distress.  Her speech is clear, but  quiet.  She follows commands without difficulty.  She transitions from a sitting position to a standing easily.  Her gait  in the room is stable.  She has a little difficulty with tandem gait,  but her Romberg test is within normal limits.  She reports discomfort  with end-range and cervical range of motion, both right and left.  Her  range is lacking only about 15% of normal with rotation, and is doing  potentially normal range with flexion and extension.  She has full  shoulder range of motion.  She requests not to bend from her lumbar  spine today.  She states her low back is bothering her quite a bit.  Seated, her reflexes in the upper and lower extremities are symmetric  and intact.  Her motor strength is good throughout.  No focal weakness  is appreciated.  No new numb spots are appreciated.  She does have quite  a bit of tenderness on the left lateral epicondyle with palpations.   IMPRESSION:  1. Bilateral knee osteoarthritis.  2. Cervicalgia, left shoulder pain improved with Neurontin.  3. Left lateral epicondylitis.  4.  Trochanteric bursitis, not as much problem today.  5. Lumbago.  6. Depression.  7. Possible cognitive deficits.   Would like her to follow back up with Dr. Leonides Cave for further evaluation  and possible compensatory strategies for her.  We will refill her  Vicodin today 5/500 one p.o. b.i.d. #60 no refills.  She does not need a  refill on her Neurontin at this time.  She will follow up with her  primary care today as well.  At this time, we did discuss treatment  options for her left elbow lateral epicondylitis, which include do  nothing treating with oral antiinflammatory medicines, icing, physical  therapy, injections.   At this point, she would like to opt for a nonsteroidal antiinflammatory  medication in addition to some  icing.  Will provide her with samples of  Celebrex 200 mg 1 p.o. daily for 15 days.  We discussed side effects.  If she runs into some problems with abdominal discomfort, she will  discontinue this medication.  We will have a nursing visit for her in  the next 4 weeks, and I will see her back after the holidays.           ______________________________  Brantley Stage, M.D.     DMK/MedQ  D:  08/15/2006 09:46:31  T:  08/15/2006 15:24:15  Job #:  [id number]

## 2011-01-29 NOTE — Assessment & Plan Note (Signed)
HISTORY OF PRESENT ILLNESS:  [name] is a 55 year old black  female who is being seen in our Pain and Rehabilitative Clinic for multiple  chronic pain complaints including cervicalgia, lumbago, left elbow pain,  occasional knee pain, and left lateral leg pain and right hand pain,  bilateral foot pain.   Her main complaints today revolve around her low back and her cervical  region. She states her pain is interfered with very little by the pain she  is in currently. Her pain is fairly constant, however. Her sleep is fairly  good. She gets a little relief with the current medications that she is on.  She was recently seen at Urgent Care on January 10, 2006 because of neck pain.  Was prescribed Naproxen and hydrocodone, which has helped somewhat as well.   PAST MEDICAL HISTORY/FAMILY HISTORY/SOCIAL HISTORY:  No other changes other  than that instance since her last visit.   PHYSICAL EXAMINATION:  VITAL SIGNS:  Blood pressure 129/65, pulse 86,  respiratory rate 17, 99% saturated on room air.  GENERAL:  A well developed, well nourished, black female who appears her  stated age. Oriented x3.  NEUROLOGIC:  Affect is bright, alert, cooperative, and pleasant. She does  not appear in any distress today. She transitions from sit to stand without  difficulty. Her gait is somewhat slow, shorter stride length bilaterally,  not particularly antalgic however. Limitations are noted in cervical range  of motion, especially with rotation to the right. Full shoulder range of  motion  is noted. Reflexes are symmetric intact in upper and lower  extremities. Motor strength is 5 over 5. No focal weakness is noted. No new  sensory deficits are appreciated. Balance is still somewhat compromised on  formal testing with tandem gait and Romberg's test.   IMPRESSION:  1.  Cervicalgia.  2.  Poor balance.  3.  Lumbago, status post lumbar fusion L5-S1, anterior listhesis of L4 and      L5.  4.  Left  elbow pain, most likely related to C6 root on the right,      irritation.   PLAN:  Will increase her hydrocodone to 5/500 1 p.o. t.i.d. p.r.n. #90. Will  get her set up for a trial of cervical traction over the next month and if  we can get her neck pain calmed down again, would like to get her back into  the balance program and physical therapy. She is reporting that she had  stopped her hydrocodone because of constipation and she has had somewhat of  a flare up in her neck and arm because of that. Will add Senokot or Docusate  tablets for her today. I wrote the names down of both of these medications  so she can get them over-the-counter, to take 1 to 2 p.o. q.h.s., to help  with constipation. Will see her back in a month. If her traction trial is  not of any benefit, may consider medial branch blocks with her to help with  the next pain. At this point, she has been stable on these medications and  for the most part, has been using them appropriately. She has had to seek  urgent care help due to her cervicalgia, however. Will see her back in a  month.           ______________________________  Brantley Stage, M.D.     DMK/MedQ  D:  01/21/2006 13:36:28  T:  01/22/2006 15:41:40  Job #:  [id number]

## 2011-01-29 NOTE — Assessment & Plan Note (Signed)
Tanya Walsh is a 55 year old African American female who is being seen in  our Pain and Rehabilitative Clinic for multiple pain complaints  including cervicalgia, lumbago, and bilateral knee pain.   She is back in today. Reports her average pain about a 6 on a scale of  10, describing it as sharp and aching with poor sleep, getting a little  relief with the current meds that she is on. She is independent with her  self care, is able to make small meals at home. She lives with other  family members.   Denies suicidal ideation, does admit to some depression and anxiety.   She is planning to follow up with Dr. Leonides Cave. She states that she was in  the emergency room for back pain, was referred to Dr. Channing Mutters who has  ordered an MRI and she does have a follow up appointment with him  sometime in February.   Regarding her memory problems, she has made contact with neurologist,  has a follow up appointment with neurologist in February. She does not  remember the name of the neurologist.   MEDICATIONS:  Provided by this clinic include;  1. Vicodin 5/500, 1 p.o. b.i.d.  2. Neurontin up to 300 mg up to 3 times a day.   PHYSICAL EXAMINATION:  Her blood pressure is 133/76, pulse 78,  respirations 16, 97% saturated on room air.  She is a well-developed, well-nourished, female who appears her stated  age. She is orient x3. Her speech is clear. Her affect is bright, alert.  She is cooperative and pleasant. She does not appear in any distress.  She is smiling, at some points, however, she does seem to have a bit of  a depressive overall affect, rather quiet. She is able to transition  from sit to stand without difficulty. Her gait in the room is normal.  She has good balance. She had limitations in lumbar motion. She had some  mild limitations in cervical motion, with rotation to the left as she is  lacking about 30% of her motion, only lacking about 10% on the right  side. Full shoulder range of  motion is appreciated.   Reflexes are 2+ at patellar tendons, 0 at the right ankle, 2+ at the  left Achilles tendon. Motor strength is 5/5 in lower extremities.  Straight leg raise is negative. No numbness is appreciated with light  touch exam.   IMPRESSION:  1. Lumbago.  2. Cervicalgia.  3. Bilateral knee osteoarthritis.  4. Left lateral epicondylitis has improved. She did take some      nonsteroidals last month. She was able to tolerate about 10 days      and towards the end of the second week of Celebrex she reported      some gastric irritation and has discontinued them.   I would like her to continue to follow up with Dr. Towanda Octave regarding her  depression and she will be following up with neurologist for cognitive  deficits evaluation.   We will refill the following medications for her today;  Vicodin 5/500, 1 p.o. b.i.d. p.r.n. back pain # 60 no refills. We will  see her back in a month. She has displayed no  aberrant behavior. She uses her medications appropriately, taking 1,  occasionally 2 tablets a day. No aberrant behavior has been noted.           ______________________________  Brantley Stage, M.D.     DMK/MedQ  D:  10/07/2006 13:17:17  T:  10/07/2006 14:21:09  Job #:  [id number]

## 2011-01-29 NOTE — Assessment & Plan Note (Signed)
Tanya Walsh is a 55 year old black female who is being seen in our pain and  rehabilitative clinic for chronic low back pain.  She was initially seen on  March 17, 2005.   She is status post an L5-S1 fusion done in 2002.   She underwent a medial branch block with Dr. Wynn Banker on April 12, 2005.  She reports some relief in her pain and, indeed, her pain score is  reflecting slight improvement.  Her average pain was a 7 and is down to a 5  today.   She has a new complaint of some left knee pain, which she says she has had  for several years, hurts off and on.  She also has some complaints of some  spider sensations in her bilateral lower extremities, worse on the left  than on the right.  She has noted these since her fusion.   Average pain is about a 5 on a scale of 10.  Sleep is poor.  Relief from  medications is between a little and fair.  She is able to climb stairs.  She  does not drive.  No new changes in her past medical, social or family  history since last visit.   She is followed through mental health by Dr. Mariea Stable for a little over a  year.  Her psych medications are filled by Dr. Mariea Stable as well.   PHYSICAL EXAMINATION TODAY:  VITAL SIGNS:  Blood pressure 155/105.  This was  brought to her attention, very concerning.  She needs to follow up with her  primary care physician regarding her pressure.  Apparently she has not taken  her medications as directed.  Pulse 85, respirations 16, 100% saturated on  room air.  She has no problems with headache, shortness of breath, chest  pain today.  GENERAL:  She is a well-developed, well-nourished black female, does not  appear in any distress.  She is oriented x3.  Affect bright, alert, overall  depressed in affect.  MUSCULOSKELETAL/NEUROLOGIC:  Gait is normal.  She has some limitations in  lumbar range of motion, all planes.  She has pronated feet and slightly  valgus at the left knee.   Reflexes are 1+ at the patellar tendon, 1+  at the Achilles tendon.  Motor  strength is good in the lower extremities.  Hip flexors, knee extensor,  dorsiflexors, plantar flexors, EHL, __________.  I do not note any confusion  today.  No crepitus is noted in flexion-extension at the patellofemoral  joint.  She does have some tenderness along the left pes anserine region as  well as the iliotibial band insertion laterally, also some mild joint line  tenderness both medially and laterally as well.   IMPRESSION:  1.  Status post L5-S1 fusion.  2.  Lumbago.  3.  Depression.  4.  Left knee pain, most likely pes anserine bursitis, iliotibial band      syndrome distally and with associated mild degenerative changes most      likely.   PLAN:  Will start some conservative therapy with her with some gentle  strengthening around the knee, ultrasound and stretching.  Will recommend a  TENS unit for her low back.  Will also add Lidoderm for the left knee.  She  was  prescribed Ultram recently one to two tablets p.o. daily.  If her left knee  does not improve over the next few months, would consider orthopedic  consultation.  Will see her back in a month.  DMK/MedQ  D:  04/21/2005 15:33:37  T:  04/22/2005 16:10:96  Job #:  [id number]

## 2011-01-29 NOTE — Group Therapy Note (Signed)
Tanya Walsh is a 55 year old, widowed, black female who is referred by Dr.  Noel Gerold to our pain and rehabilitative clinic for chronic low back pain.   She has a history of low back pain which dates back to 2002.  She underwent  a L5-S1 fusion.  In the same year, she underwent hardware removal.  This was  done in [place].  She moved here and was seen initially by Dr. Noel Gerold, I  believe, in February 2006.  Radiographs, from February 2006, showed mild  spondylosis without marked central or foraminal stenosis of her lumbar  spine.  This was an MRI scan which was done at that time.  She is known to  have a spondylolisthesis at L4-L5 as well.   She complains of pain in the low back.  She describes it as about a 6 on a  scale of 10 and its nature varies between sharp, burning, stabbing, fairly  constant and tingling.  Sleep has been poor.  Pain is exacerbated with  walking, bending, sitting, and activity, and standing.  Her relief with the  medications currently is fair.   FUNCTIONAL STATUS:  She walks with a cane.  She is unable to walk more than  five minutes before her legs feel weak.  She is able to drive, but she tells  me she can not remember the last time she drove.  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  She is independent with  feeding, dressing, bathing, and toileting.  Requires assistance with meal  prep, household duties, and shopping.   She checked bowel control problems further questioning, she actually meant  she gets some diarrhea and intermittent constipation, has been told she has  irritable bowel syndrome.  She also complains of intermittent numbness,  tingling, tremors, trouble walking, spasms, confusion, depression, anxiety.  Denies suicidal ideation.  Also reports some weakness in the lower  extremities after standing for a while.  Further review of systems, positive  for fevers, chills, night sweats, nausea, diarrhea, constipation,  reflux,  abdominal pain, coughing, shortness of breath.   PRIMARY CARE PHYSICIAN:  Dr. __________  was seen within the last month.   She also is seen by mental health, Dr. Doreatha Martin __________  last seen two weeks  ago.   PAST MEDICAL HISTORY:  1.  History of MI.  2.  Irritable bowel syndrome.  3.  Migraine headaches.  4.  High blood pressure.  5.  Arthritis.   SURGICAL HISTORY:  1.  Spine surgery x 2 in 2000, fusion and then status post instrumentation.  2.  History of tonsillectomy.  3.  Hysterectomy.   Also reports a two-year history of left facial numbness, etiology unclear,  per patient.   The patient is a widow, lives with her daughter and her two children which  are 57 and 46 years old.  The patient denies illegal substance use.  Denies  alcohol use.  Denies smoking.   FAMILY HISTORY:  Remarkable for heart disease, high blood pressure.   PHYSICAL EXAMINATION:  VITAL SIGNS:  Blood pressure 150/118, pulse 91,  respirations 18, 100% saturated on room air.  GENERAL:  She is well developed, mildly obese, black female, does not appear  in any distress.  However, on further questioning about her functional  status she becomes quite tearful, cries.  She is, however, oriented.  Her  affect is depressed.  MUSCULOSKELETAL:  She is able to stand in the room.  She uses a cane.  Has  an antalgic gait.  Her gait is slow.  She has limitations in her lumbar  range of motion in all planes.  She has a well healed scar in the lumbar  area.  She is able to get on the exam table.  Straight leg raise is  negative.  Her reflexes are intact but diminished in the lower extremities.  Pinprick is intact in the lower extremities.  She has 5/5 strength on manual  muscle testing at hip flexors, knee extensors, dorsiflexors, plantar  flexors, EHL.   IMPRESSION:  1.  Status post L5-S1 fusion and status post hardware removal, 2002, done in      [place].  2.  L4-L5 spondylolisthesis.  3.  Hypertension.   Asked the patient to follow up with primary care      physician.  She does have an elevated blood pressure again today.  She      is aware.  She has been taking hydrochlorothiazide for this.  4.  Bilateral knee pain, most likely osteoarthritis.  She does complain of      some intermittent swelling in the left knee.  This is not appreciated      today.  She does, however, have medial joint line tenderness.  5.  Depression x 4 years.   PLAN:  1.  TENS unit.  2.  Consider medial branch block.  3.  She has recently had her tramadol refilled, does not need this filled      today.  4.  May also consider an epidural injection to address spinal stenosis type      symptoms.  5.  We will see her back in a month.  6.  Her other medications at this time include Wellbutrin, Tramadol,      clonazepam, Prozac, and Ambien CR.  These are currently filled by her      mental health professional.       DMK/MedQ  D:  03/17/2005 18:15:23  T:  03/17/2005 23:26:14  Job #:  [id number]

## 2011-01-29 NOTE — Assessment & Plan Note (Signed)
Tanya Walsh is a 55 year old African-American female who is being seen in  our pain and rehabilitative clinic for multiple pain complaints,  including cervicalgia, lumbago, bilateral knee pain.   She is back in today.  Apparently, she was in the emergency room last  month, was referred to Dr. Channing Mutters who ordered an MRI, and had a followup  appointment with him in February.  However, she states that she did not  follow up with him since she did not feel good.  She initially said I do  not remember if I scheduled a followup with him, and then she said that  she really did not want to follow back up with him.  If she has to see  anybody for her back, she would rather see Dr. Noel Gerold.   However, at this time, she states her back pain is really not a big  problem for her, and she does not feel the need to see any neurosurgeon  for further evaluation.   Her chief complaint today is bilateral knee pain, actually, the right  knee is worse than the left knee.  Pain is about a 6 on a scale of 10.  Pain is worse when she is up on her feet and walking, better when she is  at rest.  Sleep tends to be poor.  She is getting fair relief with the  current meds that she is on.   She is independent with her self care, admits to depression, denies  suicidal ideation, denies bowel and bladder control problems.   MEDICATIONS PRESCRIBED BY THIS CLINIC:  1. Vicodin 5/500 1 p.o. b.i.d. #60.  2. Neurontin 300 mg t.i.d., which she has discontinued.   REVIEW OF SYSTEMS:  Positive for recent antibiotics for sinusitis.  She  maintains contact with her primary care doctor for her medical problems.   PHYSICAL EXAMINATION:  On exam today, blood pressure is 114/82, pulse is  95, respirations are 16, 97% saturated on room air.  She is a well-developed, well-nourished African-American female who  appears her stated age.  She is oriented x3.  Her affect is bright, alert, cooperative, pleasant.  Her speech is clear.  She  follows commands without difficulty.  She does  not appear in any distress.  She transitions from sit to stand independently and no pain behaviors  are appreciated.  Gait in the room is slow.  Stride length is normal.  She has difficulty with tandem gait.  Romberg's test is performed  adequately.  Reflexes are diminished at the patellar tendons and Achilles' tendons.  Sensation is intact to light touch throughout both lower extremities.  Motor strength is in the 5/5 range.  She has tenderness along the right  medial joint line.  Left lower extremity, no tenderness with palpation.  Right knee, AP and mediolateral stabilities assessed and is intact.  She  does have crepitus with flexion/extension over the patella.   IMPRESSION:  1. Right knee pain, no effusion appreciated.  No ligamentous      instability is appreciated.  2. Lumbago, improved.  3. Cervicalgia, not a problem at this time.  4. Left lateral epicondylitis, improved.   PLAN:  Will refill her medications today, Vicodin 5/500 one p.o. b.i.d.  to t.i.d. #75 no refills.  Will see her back in a month.  Also recommend  she continue to use Lidoderm.  She may cut the Lidoderm patches and  place them on her  right knee for relief as well.  She  was instructed to use a cane on a  p.r.n. basis for the knee pain.  Will see her back in a month.           ______________________________  Brantley Stage, M.D.     DMK/MedQ  D:  11/30/2006 11:42:41  T:  11/30/2006 13:34:48  Job #:  [id number]

## 2011-01-29 NOTE — H&P (Signed)
NAME:  Tanya Walsh, Tanya Walsh NO.:  [id number]   MEDICAL RECORD NO.:  [id number]                   PATIENT TYPE:  IPS   LOCATION:  0504                                 FACILITY[facility]   PHYSICIAN:  Jeanice Lim, M.D.              DATE OF BIRTH:  08-19-1956   DATE OF ADMISSION:  04/10/2004  DATE OF DISCHARGE:                         PSYCHIATRIC ADMISSION ASSESSMENT   IDENTIFYING INFORMATION:  This is a 55 year old widowed African-American  female voluntarily admitted on April 10, 2004.   HISTORY OF PRESENT ILLNESS:  The patient presents with a history of  depression.  The patient is a transfer from [facility] after the patient had  an episode of chest pain.  The patient states she recently moved to [place] in April to live with a daughter because of her depression.  She  has been isolating herself living in [place].  Her daughter is very  concerned.  The patient has been on antidepressant and had stopped for 3  months.  She reports her appetite has been decreased.  She has been having a  history of hallucinations in the past but denies any currently.  The patient  denies any major stressors.   PAST PSYCHIATRIC HISTORY:  First admission to [facility], no  history of a suicide attempt.  The patient as seeing a psychiatrist in  [place] for her depression.   SOCIAL HISTORY:  She is a 55 year old widowed African-American female,  widowed since 1992, has 3 children all adults.  She is currently living with  her daughter and moved from [place] to [place] in April of this  year.   FAMILY HISTORY:  None.   ALCOHOL DRUG HISTORY:  The patient is a nonsmoker, denies any alcohol or  drug use.   PAST MEDICAL HISTORY:  Primary care Val[place]a Slabach:  Currently the patient has  none.  Medical problems are fibromyalgia, back pain, had 2 back surgeries  with the last one being in 2002.   MEDICATIONS:  Has been taking Percocet for pain, has  been off her  antidepressant and pain pills for 3 months.  Also takes Atenolol 25 mg  b.i.d. for hypertension.   DRUG ALLERGIES:  No known allergies.   PHYSICAL EXAMINATION:  The patient was assessed at [facility] which was  reviewed.  The patient currently walks with a cane for stability.  Her vital  signs are stable, 98.7, 70 heart rate, 16 respirations.  Blood pressure is  137/94, 171 pounds.  The patient is 5 feet 3.5 inches tall.  CBC is within  normal limits.  EKG is normal.  Sed rate is 12.  Blood sugar is 101.  Cholesterol level is 136.  TSH is 1.109   MENTAL STATUS EXAM:  She is a middle aged, cooperative female, fair eye  contact.  Speech is clear.  The patient is depressed, she is very tearful.  Thought processes are  coherent, no evidence of psychosis, cognitive function  intact.  Memory is fair, judgment and insight is poor.   ADMISSION DIAGNOSES:   AXIS I:  1. Depressive disorder not otherwise specified.  2. Adjustment disorder.   AXIS II:  Deferred.   AXIS III:  Chronic back pain, hypertension, and gastroesophageal reflux  disease.   AXIS IV:  Other psychosocial problems, medical problems, housing problem  with recent move, possible lack of other social support.   AXIS V:  Current is 35, past year 52.   PLAN:  Voluntary admission for depression, having difficulty coping and  attending to ADLs.  Contract for safety, stabilize mood and thinking.  We  will resume her medications.  The patient is considered a fall risk.  The  patient is to increase coping skills.  We will have a family session with  daughter prior to discharge for support and education.   TENTATIVE LENGTH OF CARE:  4-6 days.     Landry Corporal, N.P.                       Jeanice Lim, M.D.    JO/MEDQ  D:  04/12/2004  T:  04/12/2004  Job:  [id number]

## 2011-01-29 NOTE — Assessment & Plan Note (Signed)
She is a 55 year old black female who is being seen in our Pain and  Rehabilitation Clinic for multiple pain complaints.   She was last seen on June 07, 2005.   She has also undergone a medial branch block for her lumbar spine by Erick Colace, M.D. and that was back in July.  She had some anxiety during  the procedure, however, reports that she did believe it helped somewhat with  her back pain and leg pain.   She is back in today and has complaints of some neck pain and posterior  cervical headaches as well as some left arm pain and numbness into the  lateral upper extremity.   This has been going on about 10 days.  The pain is worse when she rotates  her head to the left.  The pain is not constant.  She has some good days and  some bad days.  In the last week, she has had three good days and about two  to three bad days with respect to this neck and arm pain.   The low back pain has been fairly constant, but does wax and wane in nature.  She does have some pain that radiates posteriorly, worse with standing and  walking.   Her new pain today is some right thumb pain which is worse with use of the  right hand especially with opening cans and putting pressure through the  area.  No trauma is noted, however.   Average pain is about a 6 on a scale of 10.  Relief with Ultram is fair.  She can walk 5-10 minutes at a time.  She does use a cane and can climb  stairs.  She does drive.  She is independent with all of her self-care and  meals, including meal prep, household duties, and shopping.  She does admit  to some depression, denies suicidal ideation.   Recent bouts of some nausea and constipation.  She will follow up with  primary care doctors for that.   PHYSICAL EXAMINATION:  VITAL SIGNS:  Blood pressure 109/82, pulse 73,  respirations 16, 100% saturated on room air.  GENERAL:  She is a well-developed, well-nourished female.  She appears her  stated age.  She is  oriented x3.  Her affect is bright and alert.  She is  cooperative and pleasant.  She has some difficulty standing from a seated  position.  She puts weight through her cane as she stands up and appears to  have some extensor weakness.   When she is up walking, she is stable.  She does not need a cane while she  walks.  Romberg's test is negative.  Her gait is not particularly antalgic.  It is somewhat of a short stride length on both sides.  She has adequate  heel strike, push off, swing phase in both lower extremities.   As she stands, I note she rotates her neck slightly to the right.  She holds  it in this position as she talks to me.  She does have difficulty rotating  completely to the left when I formally test her lateral flexion, does not  bother her as much and flexion and extension do not bother her and her  mother is within normal limits.  She has full shoulder range of motion in  both upper extremities.  Seated reflexes are 2+ in the upper extremities, 2+  in the lower extremities, slightly more brisk in the lowers compared  to the  uppers, + or - whether there may be one beat of clonus on the left or not.  Toes are downgoing when Babinski is tested.   There is no increased tone in the lower extremities or the upper extremities  upon testing as well.  She has good strength throughout.  I do not detect  any focal weakness with her in the upper extremities or the lower  extremities.  She does admit to decreased sensation along the left lateral  shoulder area intact distally is noted.   Examining the right hand, no obvious joint swelling is noted.  She does have  tenderness with direct palpation to the [facility] joint.   IMPRESSION:  1.  Status post L5-S1 fusion.  2.  Cervicalgia and left arm pain/numbness, new onset.  3.  Lumbago.  4.  History of depression.  5.  Left knee pain.  6.  New right CMC joint tenderness.   PLAN:  We will have her set up back to see Dr. Wynn Banker for  medial branch  blocks in her lumbar spine.  She apparently did get some relief, although,  she was a bit anxious during this injection.  She is requesting to be  followed back up by him again.  We will get her into an occupational therapy  program, consider splinting, and education on protection techniques for the  hands.  We will obtain cervical flexion and extension films. We will refill  her medications today to include Naprosyn 500 mg one b.i.d. #20, cautioned  regarding elevation of blood pressure with the use of nonsteroidals as well  as some gastric irritation.  Also we will refill Ultram 50 mg one to two  p.o. b.i.d. #120, no refills.  See her back in a month.           ______________________________  Brantley Stage, M.D.     DMK/MedQ  D:  07/16/2005 11:10:16  T:  07/16/2005 12:20:02  Job #:  [id number]

## 2011-01-29 NOTE — Assessment & Plan Note (Signed)
MEDICAL RECORD NUMBER:  [id number].   Tanya Walsh is a 55 year old black female who is being seen in our pain and  rehabilitative clinic for chronic low back pain. She was initially seen on  March 17, 2005 and was last seen April 21, 2005.   She is in today with a new problem.   She reports approximately 10 days ago when she was on the toilet she reached  behind her with her right arm and stretched her as she tried to flush the  toilet. As the attempted to the flush the toilet, she immediately felt pain  across the anterior shoulder area and into the right shoulder blade area.   This has persisted now about 10 days, is somewhat improved. About a week  ago, she woke up with a chest pressure feeling and the continued right  shoulder pain and tells me she was seen in the emergency room and ruled out  for a myocardial infarction.   She continues to have some pain in the right cervical paraspinal  musculature, especially when she stretches her neck towards the left. She  has no numbness or tingling in the right arm at this time. Does report some  intermittent numbness in the left hand which has been persistent for about  the last week, typically only at night.   She also continues to have the low back pain. However, this was not the  primary concern at this particular office visit for her.   She continues to be functional. No new complaints of any medical or social  family history changes since last visit other than what was already  described in the history of present of illness.   PHYSICAL EXAMINATION:  Blood pressure is 136/88, pulse 71, respirations 16,  98% saturated on room air. She is a well-developed, well-nourished, black  female who does not appear in any distress. She is oriented x3. Her affect  is bright, alert, and cooperative. She had an antalgic gait. She uses a  cane.   She has mild limitations in cervical range of motion to the right and left.  She has full range of  motion actively in both right and left shoulders. She  has full forward flexion as well as abduction.   Seated, the reflexes are symmetric and intact in the upper and lower  extremities. There is no side to side differences. Plantar response is  downgoing.   No sensory deficits are appreciated with touch or pinprick in either upper  extremity.   Motor strength is 5/5 in both upper extremities including external shoulder  rotation, biceps, triceps, brachialis radialis, finger flexors, and  extrinsics.   IMPRESSION:  1.  Status post L5-S1 fusion.  2.  New right shoulder pain consistent with possible stretch injury to the      pectoralis major area.  3.  Lumbago.  4.  History of depression.  5.  Left knee pain.   PLAN:  Will refill her Ultram 50 mg 1 to 2 p.o. b.i.d. #120 and will add  Naprosyn for 5 days 500 mg 1 p.o. b.i.d. Will see her back in a month.   Will reassess her right shoulder as well as her left hand. If she has  persistent numbness, may consider electrodiagnostic testing and use of a  night splint, right shoulder. If she has persistent pain, will reevaluate  for any neurological issues. Possibly consider some physical therapy as  well.  ______________________________  Brantley Stage, M.D.     DMK/MedQ  D:  06/07/2005 13:57:11  T:  06/08/2005 13:11:06  Job #:  [id number]

## 2011-01-29 NOTE — Assessment & Plan Note (Signed)
MEDICAL RECORD NUMBER:  [id number]   [name] is a 55 year old black female who is being seen in our  pain and rehabilitative clinic for multiple pain complaints including  cervicalgia, left elbow pain, lumbago, bilateral lower extremity pain.   She is back in today for a recheck and refill of her medications.  She had  an appointment set up with Dr. Stevphen Rochester for cervical medical branch block and  this was set up for March 22, 2006, which is tomorrow.  However, she states  that she is unable to get her daughter to drive her tomorrow and she will  need to reschedule for the next month.   She continues to have predominantly left posterior cervical pain which is  worse with activity.  It bothers her at night when she sleeps.  Her average  pain is about an 8 on a scale of 10.  It interferes a little with general  activity, enjoyment of life.  Sleep is the biggest problem for her recently.  She gets a little relief with the current medications.  She is able to walk  as long as she wants.  She does use a cane or a walker, depending on the  day.  She has some difficulty with steps, does not drive.   She is independent with her self-care.  No changes in past medical, social,  or family history since our last visit.   Medications she takes from our clinic include hydrocodone 5/500 one p.o.  t.i.d.   EXAMINATION TODAY:  Her blood pressure is 137/99, pulse 66, respirations 16,  99% saturated on room air.  She is a well-developed, well-nourished female  who appears her stated age.  She is oriented x3.  Her affect is bright,  alert, cooperative, and pleasant.  She is able to transition from sit to  stand without difficulty.  She does have significant limitations in range of  motion of her neck, especially with rotation to the right is fairly  compromised.  Left, she has about 30% range of motion there.  She has full  shoulder range of motion.  Her gait is normal.  She has normal tandem  gait.  Romberg's test is within normal limits as well.   Her reflexes are 2+ and symmetric in the upper and lower extremities, 0 at  the ankles however.  No abnormal tone is noted in the upper or lower  extremities.  She has good strength throughout, no focal weaknesses  appreciated, no sensory deficits appreciated.   IMPRESSION:  1.  Cervicalgia.  2.  Lumbago.  3.  Left elbow pain intermittently.   PLAN:  Will obtain soft collar for her over the next month.  Will refill her  hydrocodone 5/500 one p.o. q.i.d. p.r.n. neck pain.  Will trial her on  Celebrex 200 mg one p.o. daily, nine doses given from sample medications.  Risks and benefits of Celebrex were reviewed with her.  She would like to  trial it.  Will reset her back up with an appointment with Dr. Stevphen Rochester next  month for cervical medial branch blocks.  I will see her back in a month for  medication refills.  She has been stable on this medication, has displayed  no aberrant behavior, is tolerating it well.  No problems with over-sedation  or constipation at this time.           ______________________________  Brantley Stage, M.D.     DMK/MedQ  D:  03/21/2006 13:28:15  T:  03/21/2006 15:16:32  Job #:  [id number]

## 2011-01-29 NOTE — Discharge Summary (Signed)
NAME:  Tanya Walsh, Tanya Walsh NO.:  [id number]   MEDICAL RECORD NO.:  [id number]                   PATIENT TYPE:  IPS   LOCATION:  0504                                 FACILITY:  [facility]   PHYSICIAN:  Geoffery Lyons, M.D.                   DATE OF BIRTH:  02/13/1956   DATE OF ADMISSION:  04/10/2004  DATE OF DISCHARGE:  04/13/2004                                 DISCHARGE SUMMARY   CHIEF COMPLAINT AND PRESENTING ILLNESS:  This was the first admission to  [facility]  for this 55 year old widowed African-American  female, voluntarily admitted.  History of depression, transferred from [facility] after the patient had an episode of chest pain.  States that he  recently moved to [place] in April to live with a daughter because of  her depression.  She has been isolating, living in [place].  Her daughter  was very concerned, had been on antidepressant, had stopped for 3 months.  Reports appetite has been decreased, history of hallucinations in the past  but denies any currently.   PAST PSYCHIATRIC HISTORY:  First time [facility], history of  seeing a psychiatrist in [place] for depression.   ALCOHOL AND DRUG HISTORY:  Denies the use or abuse of any substances.   PAST MEDICAL HISTORY:  Fibromyalgia, back pain, 2 back surgeries.   MEDICATIONS:  Percocet for pain, has been off her antidepressants and pain  pill for 3 months.  Atenolol 25 mg twice a day for hypertension.   PHYSICAL EXAMINATION:  Performed, failed to show any acute findings.   LABORATORY WORKUP:  CBC within normal limits.  EKG within normal limits. Sed  rate 12.  Blood sugar 101, cholesterol 136, TSH 1.109.   MENTAL STATUS EXAM:  Reveals a middle-aged, cooperative female, fair eye  contact.  Speech was clear.  The patient was depressed, tearful.  Thought  processes were coherent, no evidence of psychosis, denied any active  suicidal or homicidal ideations, no  evidence of delusions.  Cognition well  preserved.   ADMISSION DIAGNOSES:   AXIS I:  Major depression, recurrent.   AXIS II:  No diagnosis.   AXIS III:  Chronic back pain, hypertension, gastroesophageal reflux disease.   AXIS IV:  Moderate.   AXIS V:  Global assessment of function upon admission 35, highest global  assessment of function in past year 55.   COURSE IN HOSPITAL:  She was admitted and started on intensive individual  and group psychotherapy.  She was given Ambien for sleep, Klonopin 0.5 every  6 hours as needed for anxiety.  She was maintained on the Percocet.  She was  given Cymbalta 30 mg per day.  She was given some Protonix 40 mg daily, and  she was started on Risperdal 0.25 at 8 p.m.  She was able to open up and  share the way she was  feeling, being depressed, isolating, no energy, no  motivation for a few years, recent moving in with her daughter and son-in-  law, chronic pain.  But by August 1 she was much better.  She was in full  contact with reality, was denying any suicidal or homicidal ideas.  There  were no delusions, was feeling better.  She was able to sleep, back on  medications and she was optimistic, compliant, willing to pursue further  outpatient treatment.  Family had a lot of concerns.  So we went ahead and  discharged to outpatient follow-up.   DISCHARGE DIAGNOSES:   AXIS I:  Major depression, recurrent.   AXIS II:  No diagnosis.   AXIS III:  Chronic back pain, arterial hypertension, gastroesophageal  reflux.   AXIS IV:  Moderate.   AXIS V:  Global assessment of function upon discharge 55-60.   DISCHARGE MEDICATIONS:  1. Tenormin 25 mg twice a day.  2. Cymbalta 30 mg daily for 5 more days, then increase to Cymbalta 60 mg     daily.  3. Protonix 40 mg daily.  4. Klonopin 0.5 mg daily.  5. Risperdal 0.25 mg daily.  6. Percocet 5-325 one every 6 hours as needed.  7. Ambien 10 at bedtime for sleep.   DISPOSITION:  Follow up [facility].                                               Geoffery Lyons, M.D.    IL/MEDQ  D:  05/06/2004  T:  05/07/2004  Job:  [id number]

## 2011-01-29 NOTE — Op Note (Signed)
NAME:  Tanya Walsh, Tanya Walsh NO.:  [id number]   MEDICAL RECORD NO.:  [id number]          PATIENT TYPE:  AMB   LOCATION:  ENDO                         FACILITY:  [facility]   PHYSICIAN:  Graylin Shiver, M.D.   DATE OF BIRTH:  1955-12-10   DATE OF PROCEDURE:  02/24/2005  DATE OF DISCHARGE:                                 OPERATIVE REPORT   PROCEDURE:  Colonoscopy.   INDICATIONS:  Chronic constipation, intermittent rectal bleeding, family  history of colon polyps.   Informed consent was obtained after explanation of the risks of bleeding,  infection and perforation.   PREMEDICATION:  Fentanyl 100 mcg IV, Versed 10 milligrams IV.   PROCEDURE:  With the patient in the left lateral decubitus position, a  rectal exam was performed. No masses were felt. The Olympus colonoscope was  inserted into the rectum and advanced around the colon to the cecum. Cecal  landmarks were identified.  The scope was brought out visualizing the  colonic mucosa. The cecum and descending colon looked normal. The transverse  colon showed some mild melanosis coli.  The descending colon, sigmoid and  rectum showed some mild melanosis coli.  There were a few diverticula  scattered in the left colon.  The patient tolerated the procedure well  without complications.   IMPRESSION:  1.  Scattered diverticula in the left colon.  2.  Mild melanosis coli.   The patient will follow-up with Fleet Contras, M.D.       SFG/MEDQ  D:  02/24/2005  T:  02/24/2005  Job:  [id number]   cc:   Fleet Contras, M.D.  [address]  [place]  [place] [address]  Fax: [phone]

## 2011-01-29 NOTE — Assessment & Plan Note (Signed)
INTERVAL HISTORY:  Tanya Walsh is a 55 year old African-American female who is  being seen in our pain and rehabilitative clinic for multiple chronic pain  complaints.  She has intermittent headaches, cervicalgia, radiation to right  shoulder and down left arm to the elbow, upper to mid thoracic pain,  bilateral lumbar pain and pain radiating down the left lateral leg to the  foot and left knee pain.   Her average pain she rates as an 8 on a scale of 10, this is described as  constant, overall burning-type pain.  Sleep is poor.  Pain is exacerbated by  most activities and inactivity.  She gets little relief with the current  meds she is on.  She can walk about 5 minutes maximum.  She has difficulty  climbing stairs.  She is able to drive.  She is independent with her self-  care with the exception of household duties and shopping.   REVIEW OF SYSTEMS:  Positive for weakness, numbness, dizziness, trouble  walking, confusion, depression, anxiety.  Denies suicidal ideation.  Denies  bowel and bladder control problems.  Admits to occasional constipation for  which she takes oatmeal and prune juice, occasional abdominal pain, limb  swelling, coughing, shortness of breath.   No new changes in past medical, social, family history since last visit.   On further questioning she does admit to some occasional dizziness, facial  numbness, visual spots, headaches.   EXAMINATION:  Blood pressure 140/78, pulse 84, respirations 15, 99%  saturated on room air.  She is a well-developed African-American female who  appears her stated age.  She is oriented x3.  Her affect is bright, alert,  and cooperative.  She is able to stand up after being seated.  She does this  slowly.  Her gait is slow and deliberate in the room with a normal base of  support, more of a flatfooted gait; she has difficulty with tandem gait.  Romberg test is adequately performed.   Seated reflexes are 2+ at biceps, triceps,  brachioradialis; 2+ at the  patella tendons and Achilles tendons.   No obvious sensory changes with light touch in upper and lower extremities.  Proprioception is intact in both great toes.   She does have one beat of clonus in the left ankle otherwise no obvious  abnormal tone is noted, no focal weakness is noted.  She has tenderness to  palpation in lumbar spine.  She does report she has had a fall about 2 weeks  ago.  She landed on her buttocks when she slipped on some leaves and she  fell onto the dirt.   IMPRESSION:  1.  Cervicalgia with left upper extremity pain.  MRI is reviewed with her.      She has suspect left C6 nerve root irritation, combination of bulging      annular fibers and asymmetric facet hypertrophy at C5-6 level.  Also      noted, she has suspect disk protrusions in the upper thoracic region T2      and 3 and T3 and 4.  This was not particularly looked at with the      cervical MRI.  Radiologists reading this MRI which was done at      [facility] recommend further followup with MRI for further      evaluation of this upper thoracic level.  2.  Poor balance.  I would like to get her involved in a physical therapy      program  with an emphasis on balance, also have her follow up with      primary care regarding her dizziness, visual spots, facial numbness.      She may possibly require a neurology evaluation.  3.  History of low back pain status post surgery.  4.  History of cardiovascular disease.  5.  Irritable bowel.  6.  Migraine.   PLAN:  We will switch her from Ultram to hydrocodone 5/500 one p.o. t.i.d.  p.r.n. back and neck pain.  We will have her continue to use Naprosyn on a  p.r.n. basis not more than a couple of days a week if her stomach is  bothered by them.  Recommend she take Prilosec when she does take the  Naprosyn.  We will discontinue Ultram for now, we will refill her Lidoderm,  continue use of TENS unit.  We will obtain  flexion-extension films of her  lumbar spine with her recent fall.  We will obtain an MRI of the thoracic  spine, no contrast, to further evaluate possible upper thoracic disk  herniation.  Recommend followup with neurologist or PCP for dizziness,  visual changes, facial numbness, headache.  We will see her back before 6  weeks.           ______________________________  Brantley Stage, M.D.     DMK/MedQ  D:  08/27/2005 12:53:28  T:  08/27/2005 22:00:02  Job #:  [id number]

## 2011-01-29 NOTE — Assessment & Plan Note (Signed)
[name] is a 55 year old African American woman who is being  seen in our Pain and Rehabilitative Clinic predominantly for low back  pain, cervicalgia, lumbago, knee pain.   She comes in today and, on her pain inventory, has marked an area over  her umbilicus.  She states that this is a new pain and she is having  some left flank pain.   She states her average pain is about a 7 on a scale of 10.   She also notes that approximately 1 week ago her urine became quite dark  and had an odor and she noted increased frequency of urination.  She  believes she has had some fevers and chills as well.   She also states she woke up with a headache this morning.  She gets a  headache about twice a month.  She had been on Imitrex in the past but  has not been prescribed it for a while.   She reports getting fair relief with the current meds.  Poor sleep.  She  is independent with her self-care.  Admits to anxiety, depression.  Denies suicidal ideation.   SOCIAL HISTORY:  No changes.   FAMILY HISTORY:  No changes.   MEDICATIONS:  Medications prescribed by this clinic include Vicodin  5/500 one p.o. b.i.d. to t.i.d..   PHYSICAL EXAMINATION:  On exam today her blood pressure 141/80, pulse  71, respirations 16; 98% saturated on room air.  She is a well-  developed, well-nourished female who appears her stated age.   She does not appear in any distress.  She is smiling this morning and  appears well rested.   Her speech is clear.  Her affect is bright, alert.  She is cooperative,  pleasant.  She follow commands without difficulty.   She transitions from sitting to standing without problem.  Her gait in  the room is stable.  Romberg's test, tandem gait are evaluated and are  within normal range.   Forward flexion in the lumbar spine bothers her somewhat, as well as  extension.   Her reflexes in the lower extremity are in the 2+ range and symmetric as  well as intact.  No  abnormal tone is noted.  Her motor strength is 5/5  at hip flexors, knee extensors, dorsiflexors, plantar flexors.   She does have some mild to moderate costovertebral angle tenderness on  the right.  Her abdomen is soft with positive bowel sounds.  She did  have some tenderness with deep palpation on the right as well on  abdominal exam.   IMPRESSION:  1. New right-sided flank pain with a 10-history of intermittent fevers      and chills, dark urine, frequency, and right flank discomfort.  2. Lumbago.  3. Cervicalgia.  4. Left lateral epicondylitis, improved.   PLAN:  Will obtain UA.  A phone call to Dr. Albertina Parr office.  Patient  will be set up for an appointment today for further evaluation of this  new right flank pain.   Will write her a prescription for Vicodin 5/500 one p.o. b.i.d. p.r.n.  low back pain, #60, and will see her back in a month.  She can continue  to use Lidoderm on a p.r.n. basis.  Will see her back in a month.           ______________________________  Brantley Stage, M.D.     DMK/MedQ  D:  12/28/2006 13:01:03  T:  12/28/2006 13:27:18  Job #:  [id number]   cc:   Fleet Contras, M.D.  Fax: [phone]

## 2011-01-29 NOTE — Procedures (Signed)
NAME:  Tanya Walsh, Tanya Walsh             ACCOUNT NO.:  [id number]   MEDICAL RECORD NO.:  [id number]          PATIENT TYPE:  REC   LOCATION:  [facility]                          FACILITY:  [facility]   PHYSICIAN:  Erick Colace, M.D.DATE OF BIRTH:  April 14, 1956   DATE OF PROCEDURE:  04/12/2005  DATE OF DISCHARGE:                                 OPERATIVE REPORT   PROCEDURE:  Bilateral L4-5 facet denervation using bilateral L3 and L4  medial branch blocks.   INDICATIONS:  Spondylolisthesis, L4-5, following L5-S1 fusion.   INFORMED CONSENT:  This pain has only been partially relieved by p.o.  medications and therapy.   Informed consent was obtained after describing the risks, benefits, and  alternatives of the procedure to the patient.  These include bleeding,  bruising, infection, loss of bowel and bladder function, temporary or  permanent paralysis.  She was also informed of the need to track her pain  and of the criteria for what is determined to be a positive block.   The patient placed prone on fluoroscopy table, Betadine prep, sterile drape.  A 25-gauge, 1-1/2 inch needle was used to anesthetize skin and subcu tissues  with lidocaine 1% 2 mL times each of four sites.  First the left L4 SAP-  transverse process junction was targeted.  A 22-gauge, 3-1/2 inch spinal  needle was inserted under fluoroscopic guidance to target area, bone contact  made, confirmed with lateral imaging.  Then Omnipaque 180 under live  fluoroscopy demonstrated no intravascular uptake.  Then a solution of 2%  methylparaben-free lidocaine was injected x0.4 mL.  Then the same procedure  was done at the left L3 SAP-transverse process junction.  The needle was  guided to target area, bone contact made, confirmed with lateral imaging,  then Omnipaque 180 x0.3 mL demonstrated no intravascular uptake and 0.4 mL  of the lidocaine were injected.  Next the C-arm was obliqued onto the right  side approximately 15 degrees and a  22-gauge, 3-1/2 inch spinal needle was  inserted to the L3 SAP-transverse process junction, bone contact made,  confirmed with lateral imaging, and then 0.3 mL of Omnipaque 180 under live  fluoroscopic imaging demonstrated no  intravascular intake, and 0.4 mL of  the lidocaine 2% solution was injected; and last, the L4 SAP-transverse  process junction was targeted, bone contact made, confirmed with lateral  imaging, and once positive confirmation was made Omnipaque 180 x0.3 mL  demonstrated no intravascular uptake, then 0.4 mL of the lidocaine 2%  solution was injected.  The patient tolerated the procedure well, post-  injection instructions given.   She is to return in three weeks, possible reinjection if greater than 50%  improvement lasting at least two hours or more.       AEK/MEDQ  D:  04/12/2005 14:22:05  T:  04/13/2005 78:46:96  Job:  [id number]

## 2011-01-29 NOTE — Assessment & Plan Note (Signed)
Tanya Walsh is a 55 year old African-American female who is being seen in our  pain and rehabilitative clinic for multiple chronic pain complaints.  She  was last seen on August 27, 2005.   Her pain complaints include cervicalgia with pain radiating up to the left  posterior cranial region and also between her scapula, she describes some  pain.  She has pain in the left elbow, bilateral hip pain, pain radiating  down bilateral lower extremities.   Her ambulation is limited to about 10 minutes because of leg pain.   At the last visit MRI of the thoracic spine was ordered which showed  multiple disk protrusions in the upper thoracic spine.  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  This is narrower at that level as  well.  She also has facet arthropathy at L3-4 without any abnormal motion.   The results of her studies were reviewed with her.  Her pain inventory  reveals an average pain of about 7 on a scale of 10.  Poor sleep is noted.  Pain is worse typically with walking, standing, bending, sitting.  Improves  with rest and medication.  She gets a little relief with current medicines  she is on.  Pain is described as constant, aching, burning, and sharp in  nature.  She is, however, rather functional.  She is able to independently  feed, dress, bathe, toilet herself.  She is involved in light meal prep.  She does like to cook, so she does not make any large meals.  She is able to  help with household duties and also grocery shop.  She is able to drive and  climb some stairs.   REVIEW OF SYSTEMS:  Positive for numbness in the entire left side of her  body, tremors, tingling, toe walking, dizziness, anxiety.   Also positive for nausea, diarrhea, constipation, abdominal pain, and  shortness of breath.  She has  followed up with these symptoms with her  family doctor.   No significant changes in past medical, social, or family history since last  visit.  She continues to live with her daughter who is 23 years old.   PHYSICAL EXAMINATION:  VITAL SIGNS:  Blood pressure 148/86, pulse 85,  respirations 16, 99% saturated on room air.  GENERAL:  She is a well-developed, well-nourished, African-American female  who appears her stated age.  She is oriented x3.  Her affect is alert,  somewhat depressed, and tired appearing, but cooperative and pleasant.  She  is able to stand after being seated.  Her gait is slightly antalgic,  decreased weight bearing on the left.  Able to rise on toes.  Tandem gait,  she has some difficulty with this and tries to hold on to the examination  table.  Romberg's test, she does have some sway as well.   Seated reflexes are 2+ at the patella tendon, 2+ at the Achilles tendons.  No abnormal tone is noted.  She has 1+ reflex at the biceps on the right, 2+  on the left, 2+ at bilateral brachial radialis and 2+ at bilateral triceps.   Sensation is evaluated as well today with light touch.  She reports  diminished sensation in the left half of her face, however, intact  throughout the rest of both upper  extremities and lower extremities.   IMPRESSION:  1.  Cervicalgia.  2.  Poor balance.  3.  History of low back pain status post lumbar fusion at L5-S1 and anterior      listhesis of L4 on L5.  4.  History of cardiovascular disease, irritable bowel, and migraine.   PLAN:  We will refill her Vicodin today 5/500 one p.o. t.i.d. p.r.n. back  pain #90.  We will hold off on Naprosyn since her stomach has been bothering  her somewhat.  She does not need a refill on the Lidoderm today.  We will  send a copy of our note to Sharolyn Douglas, M.D.  Lumbar flexion extension series  reveals grossly solid fusion at L5-S1 and degenerative anterior listhesis of  L4 and L5 of 5-6 mm which moves 1  mm each way with flexion and extension.  She also continues to use a TENS unit regularly.   Again I strongly encouraged her to follow up with PCP for abdominal pain,  shortness of breath, and intermittent numbness that she experiences in the  entire left half of her body.           ______________________________  Brantley Stage, M.D.     DMK/MedQ  D:  10/06/2005 10:35:17  T:  10/06/2005 12:51:50  Job #:  [id number]   cc:   Sharolyn Douglas, M.D.  Fax: [phone]

## 2012-05-01 ENCOUNTER — Emergency Department (HOSPITAL_COMMUNITY)
Admission: EM | Admit: 2012-05-01 | Discharge: 2012-05-01 | Disposition: A | Discharge status: Home / Self Care | Payer: Medicare Other | Attending: Emergency Medicine | Admitting: Emergency Medicine

## 2012-05-01 ENCOUNTER — Emergency Department (HOSPITAL_COMMUNITY): Payer: Medicare Other

## 2012-05-01 ENCOUNTER — Encounter (HOSPITAL_COMMUNITY): Payer: Self-pay | Admitting: *Deleted

## 2012-05-01 DIAGNOSIS — R079 Chest pain, unspecified: Secondary | ICD-10-CM | POA: Insufficient documentation

## 2012-05-01 DIAGNOSIS — R000 Tachycardia, unspecified: Secondary | ICD-10-CM | POA: Insufficient documentation

## 2012-05-01 DIAGNOSIS — Z79899 Other long term (current) drug therapy: Secondary | ICD-10-CM | POA: Insufficient documentation

## 2012-05-01 DIAGNOSIS — I10 Essential (primary) hypertension: Secondary | ICD-10-CM | POA: Insufficient documentation

## 2012-05-01 HISTORY — DX: Acute myocardial infarction, unspecified: I21.9

## 2012-05-01 HISTORY — DX: Atherosclerotic heart disease of native coronary artery without angina pectoris: I25.10

## 2012-05-01 HISTORY — DX: Essential (primary) hypertension: I10

## 2012-05-01 LAB — BASIC METABOLIC PANEL
CO2: 28 mEq/L (ref 19–32)
Chloride: 104 mEq/L (ref 96–112)
GFR calc non Af Amer: 65 mL/min — ABNORMAL LOW (ref >90)
Glucose, Bld: 100 mg/dL — ABNORMAL HIGH (ref 70–99)
Potassium: 3.7 mEq/L (ref 3.5–5.1)
Sodium: 141 mEq/L (ref 135–145)

## 2012-05-01 LAB — CBC
Hemoglobin: 13.4 g/dL (ref 12.0–15.0)
RBC: 4.85 MIL/uL (ref 3.87–5.11)
WBC: 4 10*3/uL (ref 4.0–10.5)

## 2012-05-01 MED ORDER — MORPHINE SULFATE 4 MG/ML IJ SOLN
6.0000 mg | Freq: Once | INTRAMUSCULAR | Status: AC
  Administered 2012-05-01: 6 mg via INTRAVENOUS
  Filled 2012-05-01: qty 2

## 2012-05-01 MED ORDER — HYDROCODONE-ACETAMINOPHEN 5-325 MG PO TABS: 1.0000 | ORAL_TABLET | ORAL | Status: DC | PRN

## 2012-05-01 NOTE — ED Notes (Signed)
Pt on stretcher, nad noted, abc intact, complains of pain in chest and dizziness since this am.

## 2012-05-01 NOTE — ED Notes (Signed)
PT states mid chest discomfort this am and then radiated to left chest, arm, left head pressure, and left neck.  Then developed dizziness..  Pt has had racing heart rate in the past.  Pt has had mild heart attack in past

## 2012-05-01 NOTE — ED Provider Notes (Signed)
History     CSN: [id number]  Arrival date & time 05/01/12  0911   First MD Initiated Contact with Patient 05/01/12 [phone]      Chief Complaint  Patient presents with  . Chest Pain     The history is provided by the patient.   the patient reports awakening with mid chest discomfort that is worse with movement and palpation today.  She has had some discomfort in the left chest and arm as well.  She's had these symptoms before and has had them on several occasions.  She had stress test in 2005 at 2010 both of which were normal nares were completed for her symptoms at that time.  She's never seen cardiology as an outpatient.  There is a consult note in 2012 for an admission for chest pain as well.  The patient denies shortness of breath.  She states she has had some palpitations today without any palpitations at this time.  No near-syncope or syncope.  Her symptoms are mild in severity.  Nursing note reveals a history of MI and coronary artery disease however based on cardiology consultation note from 2012 it sounds like there is a questionable MI in 2002 and no discussion of documented coronary artery disease.  No fevers or chills.  No unilateral leg swelling.  No history of pulmonary embolism or DVT.  Past Medical History  Diagnosis Date  . Coronary artery disease   . Myocardial infarction   . Hypertension     Past Surgical History  Procedure Date  . Abdominal hysterectomy   . Back surgery     No family history on file.  History  Substance Use Topics  . Smoking status: Never Smoker   . Smokeless tobacco: Not on file  . Alcohol Use: Yes     rare    OB History    Grav Para Term Preterm Abortions TAB SAB Ect Mult Living                  Review of Systems  Cardiovascular: Positive for chest pain.  All other systems reviewed and are negative.    Allergies  Review of patient's allergies indicates no known allergies.  Home Medications   Current Outpatient Rx  Name Route  Sig Dispense Refill  . ASPIRIN 81 MG PO TABS Oral Take 81 mg by mouth daily.    [name] HYDROCHLOROTHIAZIDE 12.5 MG PO TABS Oral Take 12.5 mg by mouth daily.    [name] HYDROCODONE-ACETAMINOPHEN 5-500 MG PO TABS Oral Take 1 tablet by mouth every 6 (six) hours as needed. For pain    . METOPROLOL TARTRATE 25 MG PO TABS Oral Take 25 mg by mouth 2 (two) times daily.    [name] PANTOPRAZOLE SODIUM 40 MG PO TBEC Oral Take 40 mg by mouth daily.    [name] POTASSIUM CHLORIDE ER 10 MEQ PO CPCR Oral Take 10 mEq by mouth daily.      BP 116/76  Pulse 51  Temp 98.2 F (36.8 C) (Oral)  Resp 14  SpO2 97%  Physical Exam  Nursing note and vitals reviewed. Constitutional: She is oriented to person, place, and time. She appears well-developed and well-nourished. No distress.  HENT:  Head: Normocephalic and atraumatic.  Eyes: EOM are normal.  Neck: Normal range of motion.  Cardiovascular: Normal rate, regular rhythm and normal heart sounds.   Pulmonary/Chest: Effort normal and breath sounds normal.       Tenderness to palpation of right anterior chest with reproduction  of her pain.  Abdominal: Soft. She exhibits no distension. There is no tenderness.  Musculoskeletal: Normal range of motion.  Neurological: She is alert and oriented to person, place, and time.  Skin: Skin is warm and dry.  Psychiatric: She has a normal mood and affect. Judgment normal.    ED Course  Procedures (including critical care time)  Date: 05/01/2012  Rate: 76  Rhythm: normal sinus rhythm  QRS Axis: normal  Intervals: normal  ST/T Wave abnormalities: normal  Conduction Disutrbances: none  Narrative Interpretation:   Old EKG Reviewed: No significant changes noted     Labs Reviewed  BASIC METABOLIC PANEL - Abnormal; Notable for the following:    Glucose, Bld 100 (*)     GFR calc non Af Amer 65 (*)     GFR calc Af Amer 75 (*)     All other components within normal limits  CBC  TROPONIN I   Dg Chest 2 View  05/01/2012  *RADIOLOGY  REPORT*  Clinical Data: Chest pain.  Tachycardia.  Hypertension.  CHEST - 2 VIEW  Comparison: 12/19/2010  Findings: Low lung volumes demonstrate, however both lungs are clear.  No evidence of pneumothorax or pleural effusion.  Heart size is normal.  No mass or lymphadenopathy identified.  Mild S- shaped thoracic scoliosis is stable.  IMPRESSION: No active cardiopulmonary disease.   Original Report Authenticated By: Danae Orleans, M.D. ( 05/01/2012 10:27:26 )      1. Chest pain       MDM  The patient has reproducible chest pain at this time and a normal EKG.  Chest and labs we obtained.  My suspicion for acute coronary syndrome is very low.  Likely this is chest wall pain.  The patient does have a history of fibromyalgia as well.  Likely outpatient PCP followup.  The patient also has a scheduled followup with [facility] for which I will recommend that she keep. Pain treated with pain meds at this time        Lyanne Co, MD 05/01/12 1259

## 2012-05-01 NOTE — ED Notes (Signed)
Patient transported to X-ray 

## 2012-05-05 ENCOUNTER — Emergency Department (HOSPITAL_COMMUNITY): Payer: Medicare Other

## 2012-05-05 ENCOUNTER — Encounter (HOSPITAL_COMMUNITY): Payer: Self-pay | Admitting: Emergency Medicine

## 2012-05-05 ENCOUNTER — Emergency Department (HOSPITAL_COMMUNITY)
Admission: EM | Admit: 2012-05-05 | Discharge: 2012-05-05 | Disposition: A | Discharge status: Home / Self Care | Payer: Medicare Other | Attending: Emergency Medicine | Admitting: Emergency Medicine

## 2012-05-05 DIAGNOSIS — M79609 Pain in unspecified limb: Secondary | ICD-10-CM | POA: Insufficient documentation

## 2012-05-05 DIAGNOSIS — R209 Unspecified disturbances of skin sensation: Secondary | ICD-10-CM | POA: Insufficient documentation

## 2012-05-05 DIAGNOSIS — I252 Old myocardial infarction: Secondary | ICD-10-CM | POA: Insufficient documentation

## 2012-05-05 DIAGNOSIS — R202 Paresthesia of skin: Secondary | ICD-10-CM

## 2012-05-05 DIAGNOSIS — I2510 Atherosclerotic heart disease of native coronary artery without angina pectoris: Secondary | ICD-10-CM | POA: Insufficient documentation

## 2012-05-05 DIAGNOSIS — M542 Cervicalgia: Secondary | ICD-10-CM | POA: Insufficient documentation

## 2012-05-05 DIAGNOSIS — Z7982 Long term (current) use of aspirin: Secondary | ICD-10-CM | POA: Insufficient documentation

## 2012-05-05 DIAGNOSIS — I10 Essential (primary) hypertension: Secondary | ICD-10-CM | POA: Insufficient documentation

## 2012-05-05 DIAGNOSIS — Z79899 Other long term (current) drug therapy: Secondary | ICD-10-CM | POA: Insufficient documentation

## 2012-05-05 LAB — BASIC METABOLIC PANEL
CO2: 29 mEq/L (ref 19–32)
Calcium: 9.8 mg/dL (ref 8.4–10.5)
Glucose, Bld: 93 mg/dL (ref 70–99)
Potassium: 3.8 mEq/L (ref 3.5–5.1)
Sodium: 142 mEq/L (ref 135–145)

## 2012-05-05 LAB — URINALYSIS, ROUTINE W REFLEX MICROSCOPIC
Glucose, UA: NEGATIVE mg/dL
Hgb urine dipstick: NEGATIVE
Specific Gravity, Urine: 1.018 (ref 1.005–1.030)
pH: 7.5 (ref 5.0–8.0)

## 2012-05-05 LAB — CBC WITH DIFFERENTIAL/PLATELET
Eosinophils Relative: 4 % (ref 0–5)
Lymphocytes Relative: 50 % — ABNORMAL HIGH (ref 12–46)
Lymphs Abs: 1.9 10*3/uL (ref 0.7–4.0)
MCV: 84 fL (ref 78.0–100.0)
Platelets: 302 10*3/uL (ref 150–400)
RBC: 5 MIL/uL (ref 3.87–5.11)
WBC: 3.8 10*3/uL — ABNORMAL LOW (ref 4.0–10.5)

## 2012-05-05 LAB — GLUCOSE, CAPILLARY: Glucose-Capillary: 103 mg/dL — ABNORMAL HIGH (ref 70–99)

## 2012-05-05 NOTE — ED Notes (Signed)
Pt. States she came in Monday with c/o Left CP and pressure in head. Pt. Presents today with head pressure, dizziness and left facial numbness, and left leg pain and soreness. Pt. Denies CP today. CN V, VII, XII are intact. Pupils reactive. Equal hand grips. Pt. Is in NAD. A&Ox4.

## 2012-05-05 NOTE — ED Provider Notes (Signed)
History     CSN: [id number]  Arrival date & time 05/05/12  1102   First MD Initiated Contact with Patient 05/05/12 1247      Chief Complaint  Patient presents with  . Numbness  . Leg Pain    (Consider location/radiation/quality/duration/timing/severity/associated sxs/prior treatment) HPI Complaints of numbness of the entire left face onset upon awakening this morning. She also complained of left-sided neck pain for 3 days. Neck pain has resolved nothing makes symptoms better or worse she still complains of numbness on the left side of her face. Patient also reports left calf pain for several years, unchanged. Denies chest pain denies shortness of breath denies difficulty speaking no other associated symptoms Past Medical History  Diagnosis Date  . Coronary artery disease   . Myocardial infarction   . Hypertension    Fibromyalgia, per old records questionable history of MI several years ago no documented history of coronary artery disease Past Surgical History  Procedure Date  . Abdominal hysterectomy   . Back surgery     History reviewed. No pertinent family history.  History  Substance Use Topics  . Smoking status: Never Smoker   . Smokeless tobacco: Not on file  . Alcohol Use: Yes     rare    OB History    Grav Para Term Preterm Abortions TAB SAB Ect Mult Living                  Review of Systems  Constitutional: Negative.   HENT: Positive for neck pain.   Respiratory: Negative.   Cardiovascular: Negative.   Gastrointestinal: Negative.   Musculoskeletal: Positive for myalgias.  Skin: Negative.   Neurological: Positive for numbness.  Hematological: Negative.   Psychiatric/Behavioral: Negative.     Allergies  Review of patient's allergies indicates no known allergies.  Home Medications   Current Outpatient Rx  Name Route Sig Dispense Refill  . ASPIRIN 81 MG PO TABS Oral Take 81 mg by mouth daily.    [name] HYDROCHLOROTHIAZIDE 12.5 MG PO TABS Oral Take  12.5 mg by mouth daily.    [name] HYDROCODONE-ACETAMINOPHEN 5-500 MG PO TABS Oral Take 1 tablet by mouth every 6 (six) hours as needed. For pain    . METOPROLOL TARTRATE 25 MG PO TABS Oral Take 25 mg by mouth 2 (two) times daily.    . ADULT MULTIVITAMIN W/MINERALS CH Oral Take 1 tablet by mouth daily.    [name] PANTOPRAZOLE SODIUM 40 MG PO TBEC Oral Take 40 mg by mouth daily.    [name] POTASSIUM CHLORIDE ER 10 MEQ PO CPCR Oral Take 10 mEq by mouth daily.      BP 119/82  Pulse 51  Resp 19  SpO2 98%  Physical Exam  Nursing note and vitals reviewed. Constitutional: She is oriented to person, place, and time. She appears well-developed and well-nourished.  HENT:  Head: Normocephalic and atraumatic.  Eyes: Conjunctivae are normal. Pupils are equal, round, and reactive to light.  Neck: Neck supple. No tracheal deviation present. No thyromegaly present.  Cardiovascular: Normal rate and regular rhythm.   No murmur heard. Pulmonary/Chest: Effort normal and breath sounds normal.  Abdominal: Soft. Bowel sounds are normal. She exhibits no distension. There is no tenderness.  Musculoskeletal: Normal range of motion. She exhibits no edema and no tenderness.  Neurological: She is alert and oriented to person, place, and time. No cranial nerve deficit. Coordination normal.       Gait normal Romberg normal pronator drift normal motor strength  5 over 5 overall  Skin: Skin is warm and dry. No rash noted.  Psychiatric: She has a normal mood and affect.    ED Course  Procedures (including critical care time)  Labs Reviewed  CBC WITH DIFFERENTIAL - Abnormal; Notable for the following:    WBC 3.8 (*)     Neutrophils Relative 39 (*)     Neutro Abs 1.5 (*)     Lymphocytes Relative 50 (*)     All other components within normal limits  BASIC METABOLIC PANEL - Abnormal; Notable for the following:    GFR calc non Af Amer 75 (*)     GFR calc Af Amer 87 (*)     All other components within normal limits  GLUCOSE,  CAPILLARY - Abnormal; Notable for the following:    Glucose-Capillary 103 (*)     All other components within normal limits  URINALYSIS, ROUTINE W REFLEX MICROSCOPIC   No results found.  Date: 05/05/2012  Rate: 65  Rhythm: normal sinus rhythm  QRS Axis: normal  Intervals: normal  ST/T Wave abnormalities: normal  Conduction Disutrbances: none  Narrative Interpretation: unremarkable  Unchanged from 05/01/2012 interpreted by me   No diagnosis found.  3:30 PM  MDM  Patient is not candidateMRI as she says she has a piece of metal stuck in her foot from years ago. Suspicion for acute stroke is low Plan followup doctor on Dr. Concepcion Elk Diagnosis #1 paresthesias #2 neck pain      Doug Sou, MD 05/05/12 1559

## 2012-05-05 NOTE — ED Notes (Signed)
Pt c/o left facial numbness upon waking this am with left leg pain and numbness and HA; mild drawing to left side of face noted and speech clear; no other neuro deficits noted

## 2012-07-27 ENCOUNTER — Emergency Department (HOSPITAL_COMMUNITY): Payer: Medicare Other

## 2012-07-27 ENCOUNTER — Emergency Department (HOSPITAL_COMMUNITY)
Admission: EM | Admit: 2012-07-27 | Discharge: 2012-07-27 | Disposition: A | Discharge status: Home / Self Care | Payer: Medicare Other | Attending: Emergency Medicine | Admitting: Emergency Medicine

## 2012-07-27 ENCOUNTER — Encounter (HOSPITAL_COMMUNITY): Payer: Self-pay | Admitting: Emergency Medicine

## 2012-07-27 DIAGNOSIS — R6883 Chills (without fever): Secondary | ICD-10-CM | POA: Insufficient documentation

## 2012-07-27 DIAGNOSIS — F329 Major depressive disorder, single episode, unspecified: Secondary | ICD-10-CM | POA: Insufficient documentation

## 2012-07-27 DIAGNOSIS — R42 Dizziness and giddiness: Secondary | ICD-10-CM | POA: Insufficient documentation

## 2012-07-27 DIAGNOSIS — I10 Essential (primary) hypertension: Secondary | ICD-10-CM | POA: Insufficient documentation

## 2012-07-27 DIAGNOSIS — R059 Cough, unspecified: Secondary | ICD-10-CM | POA: Insufficient documentation

## 2012-07-27 DIAGNOSIS — Z79899 Other long term (current) drug therapy: Secondary | ICD-10-CM | POA: Insufficient documentation

## 2012-07-27 DIAGNOSIS — R079 Chest pain, unspecified: Secondary | ICD-10-CM | POA: Insufficient documentation

## 2012-07-27 DIAGNOSIS — F3289 Other specified depressive episodes: Secondary | ICD-10-CM | POA: Insufficient documentation

## 2012-07-27 DIAGNOSIS — R490 Dysphonia: Secondary | ICD-10-CM | POA: Insufficient documentation

## 2012-07-27 DIAGNOSIS — J029 Acute pharyngitis, unspecified: Secondary | ICD-10-CM | POA: Insufficient documentation

## 2012-07-27 DIAGNOSIS — I2510 Atherosclerotic heart disease of native coronary artery without angina pectoris: Secondary | ICD-10-CM | POA: Insufficient documentation

## 2012-07-27 DIAGNOSIS — R05 Cough: Secondary | ICD-10-CM

## 2012-07-27 DIAGNOSIS — I252 Old myocardial infarction: Secondary | ICD-10-CM | POA: Insufficient documentation

## 2012-07-27 DIAGNOSIS — R0602 Shortness of breath: Secondary | ICD-10-CM | POA: Insufficient documentation

## 2012-07-27 DIAGNOSIS — Z7982 Long term (current) use of aspirin: Secondary | ICD-10-CM | POA: Insufficient documentation

## 2012-07-27 HISTORY — DX: Depression, unspecified: F32.A

## 2012-07-27 HISTORY — DX: Major depressive disorder, single episode, unspecified: F32.9

## 2012-07-27 MED ORDER — LIDOCAINE VISCOUS 2 % MT SOLN: 10.0000 mL | Freq: Three times a day (TID) | OROMUCOSAL | Status: DC | PRN

## 2012-07-27 MED ORDER — HYDROCODONE-HOMATROPINE 5-1.5 MG/5ML PO SYRP: 2.5000 mL | ORAL_SOLUTION | Freq: Four times a day (QID) | ORAL | Status: DC | PRN

## 2012-07-27 MED ORDER — LIDOCAINE VISCOUS 2 % MT SOLN
20.0000 mL | Freq: Once | OROMUCOSAL | Status: AC
  Administered 2012-07-27: 20 mL via OROMUCOSAL
  Filled 2012-07-27: qty 15

## 2012-07-27 NOTE — ED Notes (Addendum)
Pt reports since Sunday, having hoarse voice, cough, some nasal congestion; pt reports having mold in apt recently; pt denies pain currently, reports head pain when coughs

## 2012-07-27 NOTE — ED Notes (Signed)
Report received; Assumed patient care

## 2012-07-27 NOTE — ED Provider Notes (Signed)
History  Scribed for Tanya Munch, MD, the patient was seen in room TR06C/TR06C. This chart was scribed by Candelaria Stagers. The patient's care started at 6:01 PM   CSN: [id number]  Arrival date & time 07/27/12  1613   First MD Initiated Contact with Patient 07/27/12 1746      Chief Complaint  Patient presents with  . Cough  . Hoarse     The history is provided by the patient. No language interpreter was used.   Tanya Walsh is a 56 y.o. female who presents to the Emergency Department complaining of cough, sore throat, and decrease voice that started about five days ago.  Pt has also experienced chills, SOB, dizziness, and chest pain associated with the cough.  She denies vomiting, diarrhea, confusion, or disorientation.  Nothing seems to make the sx better or worse.        Past Medical History  Diagnosis Date  . Coronary artery disease   . Myocardial infarction   . Hypertension   . Depression     Past Surgical History  Procedure Date  . Abdominal hysterectomy   . Back surgery     History reviewed. No pertinent family history.  History  Substance Use Topics  . Smoking status: Never Smoker   . Smokeless tobacco: Not on file  . Alcohol Use: No     Comment: rare    OB History    Grav Para Term Preterm Abortions TAB SAB Ect Mult Living                  Review of Systems  Constitutional: Positive for chills.       Per HPI, otherwise negative  HENT: Positive for sore throat and voice change.        Per HPI, otherwise negative  Eyes: Negative.   Respiratory: Positive for cough and shortness of breath.        Per HPI, otherwise negative  Cardiovascular: Positive for chest pain (associated with cough).       Per HPI, otherwise negative  Gastrointestinal: Negative for nausea, vomiting and diarrhea.  Genitourinary: Negative.   Musculoskeletal:       Per HPI, otherwise negative  Skin: Negative.   Neurological: Positive for dizziness. Negative for syncope.   All other systems reviewed and are negative.    Allergies  Review of patient's allergies indicates no known allergies.  Home Medications   Current Outpatient Rx  Name  Route  Sig  Dispense  Refill  . ASPIRIN 81 MG PO CHEW   Oral   Chew 81 mg by mouth daily.         [name] HYDROCHLOROTHIAZIDE 12.5 MG PO CAPS   Oral   Take 12.5 mg by mouth daily.         [name] HYDROCODONE-ACETAMINOPHEN 5-500 MG PO TABS   Oral   Take 1 tablet by mouth every 6 (six) hours as needed. For pain         . METOPROLOL TARTRATE 25 MG PO TABS   Oral   Take 25 mg by mouth 2 (two) times daily.         . ADULT MULTIVITAMIN W/MINERALS CH   Oral   Take 1 tablet by mouth daily.         [name] ALKA-SELTZER PLUS COLD & COUGH PO   Oral   Take 1 tablet by mouth every 4 (four) hours as needed. For cough and cold         .  POTASSIUM CHLORIDE ER 10 MEQ PO CPCR   Oral   Take 10 mEq by mouth daily.         [name] VITAMIN C 500 MG PO TABS   Oral   Take 500 mg by mouth daily.           BP 138/102  Pulse 94  Temp 98.2 F (36.8 C) (Oral)  Resp 19  SpO2 95%  Physical Exam  Nursing note and vitals reviewed. Constitutional: She is oriented to person, place, and time. She appears well-developed and well-nourished. No distress.  HENT:  Head: Normocephalic and atraumatic.  Mouth/Throat: Oropharynx is clear and moist.       No lymph nodes palpable  Eyes: EOM are normal.  Neck: Neck supple. No tracheal deviation present.  Cardiovascular: Normal rate.   Pulmonary/Chest: Effort normal. No respiratory distress. She has no wheezes. She has no rales.  Abdominal: Soft. Bowel sounds are normal.  Musculoskeletal: Normal range of motion.  Neurological: She is alert and oriented to person, place, and time.  Skin: Skin is warm and dry.  Psychiatric: She has a normal mood and affect. Her behavior is normal.    ED Course  Procedures  DIAGNOSTIC STUDIES: Oxygen Saturation is 95% on room air, normal by my  interpretation.    COORDINATION OF CARE:  18:09 Ordered: DG Chest 2 View   Labs Reviewed - No data to display Dg Chest 2 View  07/27/2012  *RADIOLOGY REPORT*  Clinical Data: Chest pain, cough and congestion  CHEST - 2 VIEW  Comparison: 05/01/2012  Findings: The heart size and mediastinal contours are within normal limits.  Both lungs are clear.  The visualized skeletal structures are unremarkable.  IMPRESSION: Negative examination.   Original Report Authenticated By: Signa Kell, M.D.      No diagnosis found.   Date: 07/27/2012  Rate: 74  Rhythm: normal sinus rhythm  QRS Axis: left  Intervals: normal  ST/T Wave abnormalities: normal  Conduction Disutrbances:none  Narrative Interpretation:   Old EKG Reviewed: unchanged LVH - Borderline  Update: Following lido (topical) the patient notes a substantial improvement in her condition. MDM  I personally performed the services described in this documentation, which was scribed in my presence. The recorded information has been reviewed and is accurate.  This patient with multiple medical problems now presents with several days of cough, generalized discomfort and sore throat.  On exam she is in no distress.  The patient's vital signs are unremarkable.  Given her history of medical issues that she had a x-ray to evaluate for pneumonia, this was negative.  The patient's ECG was unchanged, nonischemic.  The patient's initial improvement with topical medication.  She was discharged in stable condition to follow up with her primary care physician.  Of distress, ongoing chest pain, ongoing dyspnea, abnormal vital signs, is low suspicion for acute ongoing coronary ischemia, or bacteremia.   Tanya Munch, MD 07/27/12 1929

## 2012-11-03 ENCOUNTER — Emergency Department (HOSPITAL_COMMUNITY): Payer: Medicare Other

## 2012-11-03 ENCOUNTER — Emergency Department (HOSPITAL_COMMUNITY)
Admission: EM | Admit: 2012-11-03 | Discharge: 2012-11-04 | Disposition: A | Discharge status: Home / Self Care | Payer: Medicare Other | Attending: Emergency Medicine | Admitting: Emergency Medicine

## 2012-11-03 ENCOUNTER — Encounter (HOSPITAL_COMMUNITY): Payer: Self-pay | Admitting: Emergency Medicine

## 2012-11-03 DIAGNOSIS — M5430 Sciatica, unspecified side: Secondary | ICD-10-CM | POA: Insufficient documentation

## 2012-11-03 DIAGNOSIS — M549 Dorsalgia, unspecified: Secondary | ICD-10-CM | POA: Insufficient documentation

## 2012-11-03 DIAGNOSIS — M79609 Pain in unspecified limb: Secondary | ICD-10-CM | POA: Insufficient documentation

## 2012-11-03 DIAGNOSIS — Z8659 Personal history of other mental and behavioral disorders: Secondary | ICD-10-CM | POA: Insufficient documentation

## 2012-11-03 DIAGNOSIS — M25559 Pain in unspecified hip: Secondary | ICD-10-CM | POA: Insufficient documentation

## 2012-11-03 DIAGNOSIS — Z79899 Other long term (current) drug therapy: Secondary | ICD-10-CM | POA: Insufficient documentation

## 2012-11-03 DIAGNOSIS — M79673 Pain in unspecified foot: Secondary | ICD-10-CM

## 2012-11-03 DIAGNOSIS — G8929 Other chronic pain: Secondary | ICD-10-CM | POA: Insufficient documentation

## 2012-11-03 DIAGNOSIS — Z9889 Other specified postprocedural states: Secondary | ICD-10-CM | POA: Insufficient documentation

## 2012-11-03 DIAGNOSIS — I252 Old myocardial infarction: Secondary | ICD-10-CM | POA: Insufficient documentation

## 2012-11-03 DIAGNOSIS — I10 Essential (primary) hypertension: Secondary | ICD-10-CM | POA: Insufficient documentation

## 2012-11-03 DIAGNOSIS — I2510 Atherosclerotic heart disease of native coronary artery without angina pectoris: Secondary | ICD-10-CM | POA: Insufficient documentation

## 2012-11-03 DIAGNOSIS — Z7982 Long term (current) use of aspirin: Secondary | ICD-10-CM | POA: Insufficient documentation

## 2012-11-03 MED ORDER — OXYCODONE-ACETAMINOPHEN 5-325 MG PO TABS
2.0000 | ORAL_TABLET | Freq: Once | ORAL | Status: AC
  Administered 2012-11-03: 2 via ORAL
  Filled 2012-11-03: qty 2

## 2012-11-03 MED ORDER — AMOXICILLIN-POT CLAVULANATE 875-125 MG PO TABS: 1.0000 | ORAL_TABLET | Freq: Two times a day (BID) | ORAL | Status: DC

## 2012-11-03 MED ORDER — DIAZEPAM 5 MG PO TABS
10.0000 mg | ORAL_TABLET | Freq: Once | ORAL | Status: AC
Start: 2012-11-03 — End: 2012-11-03
  Administered 2012-11-03: 10 mg via ORAL
  Filled 2012-11-03: qty 2

## 2012-11-03 MED ORDER — NAPROXEN 500 MG PO TABS: 500.0000 mg | ORAL_TABLET | Freq: Two times a day (BID) | ORAL | Status: DC

## 2012-11-03 NOTE — ED Provider Notes (Signed)
History    This chart was scribed for non-physician practitioner working with Carleene Cooper III, MD by Frederik Pear, ED Scribe. This patient was seen in room [facility] and the patient's care was started at 2153.   CSN: [id number]  Arrival date & time 11/03/12  2022   First MD Initiated Contact with Patient 11/03/12 2153      Chief Complaint  Patient presents with  . Foot Pain    (Consider location/radiation/quality/duration/timing/severity/associated sxs/prior treatment) The history is provided by medical records and the patient. No language interpreter was used.    Tanya Walsh is a 57 y.o. female who presents to the Emergency Department complaining of sudden onset, constant, gradually worsening left foot pain with an abnormal gait and swelling that is aggravated by walking and bearing weight and improved by laying down that began yesterday at 14:00 when she had been sitting and went to stand up. She also complains of back and left hip pain that began after the foot pain. She has a h/o of chronic back pain and had back surgery performed in 2002. She reports that she typically take Vicodin for her back pain. She reports that she has been treating the current foot pain with ibuprofen and wrapped the foot and kept it elevated at home with no relief. She has a h/o of hypertension, MI, and coronary artery disease, but denies a h/o of sciatica.  Patient denies bowel or bladder incontinence, loss of function of legs or loss of sensation.  Past Medical History  Diagnosis Date  . Coronary artery disease   . Myocardial infarction   . Hypertension   . Depression     Past Surgical History  Procedure Laterality Date  . Abdominal hysterectomy    . Back surgery      History reviewed. No pertinent family history.  History  Substance Use Topics  . Smoking status: Never Smoker   . Smokeless tobacco: Not on file  . Alcohol Use: Yes     Comment: rare    OB History   Grav Para Term  Preterm Abortions TAB SAB Ect Mult Living                  Review of Systems  HENT: Negative for neck pain and neck stiffness.   Musculoskeletal: Positive for joint swelling and arthralgias. Negative for back pain.  Skin: Negative for wound.  Neurological: Negative for numbness.  All other systems reviewed and are negative.    Allergies  Review of patient's allergies indicates no known allergies.  Home Medications   Current Outpatient Rx  Name  Route  Sig  Dispense  Refill  . aspirin 81 MG chewable tablet   Oral   Chew 81 mg by mouth daily.         . hydrochlorothiazide (MICROZIDE) 12.5 MG capsule   Oral   Take 12.5 mg by mouth daily.         [name] HYDROcodone-acetaminophen (VICODIN) 5-500 MG per tablet   Oral   Take 1 tablet by mouth every 6 (six) hours as needed. For pain         . metoprolol tartrate (LOPRESSOR) 25 MG tablet   Oral   Take 25 mg by mouth 2 (two) times daily.         . Multiple Vitamin (MULTIVITAMIN WITH MINERALS) TABS   Oral   Take 1 tablet by mouth daily.         . potassium chloride (MICRO-K) 10 MEQ CR  capsule   Oral   Take 10 mEq by mouth daily.         . vitamin C (ASCORBIC ACID) 500 MG tablet   Oral   Take 500 mg by mouth daily.         . naproxen (NAPROSYN) 500 MG tablet   Oral   Take 1 tablet (500 mg total) by mouth 2 (two) times daily with a meal.   30 tablet   0     BP 136/90  Pulse 77  Temp(Src) 98 F (36.7 C) (Oral)  Resp 16  SpO2 97%  Physical Exam  Nursing note and vitals reviewed. Constitutional: She is oriented to person, place, and time. She appears well-developed and well-nourished. No distress.  HENT:  Head: Normocephalic and atraumatic.  Mouth/Throat: Oropharynx is clear and moist.  Eyes: Conjunctivae are normal. Pupils are equal, round, and reactive to light.  Neck: Normal range of motion.  Cardiovascular: Normal rate, regular rhythm, normal heart sounds and intact distal pulses.  Exam reveals no  gallop and no friction rub.   No murmur heard. Capillary refill less than 3 seconds  Pulmonary/Chest: Effort normal and breath sounds normal. No respiratory distress. She has no wheezes. She has no rales. She exhibits no tenderness.  Musculoskeletal: She exhibits tenderness. She exhibits no edema.  Tender to palpation in the medial side of the plantar surface near the arch. Good ROM. Sensation is intact. Left SI joint is tender. Left buttock pain is replicable with the sciatic nerve. Good capillary refill.   Lymphadenopathy:    She has no cervical adenopathy.  Neurological: She is alert and oriented to person, place, and time. No cranial nerve deficit. Coordination normal.  Abnormal gait 2/2 to pain in the foot.  Speech is clear and goal oriented, follows commands Normal strength in upper and lower extremities bilaterally including dorsiflexion and plantar flexion, strong and equal grip strength Sensation normal to light and sharp touch Moves extremities without ataxia, coordination intact Normal balance  Skin: Skin is warm and dry. No rash noted. She is not diaphoretic. No erythema. No pallor.  Psychiatric: She has a normal mood and affect.    ED Course  Procedures (including critical care time)  DIAGNOSTIC STUDIES: Oxygen Saturation is 97% on room air, normal by my interpretation.    COORDINATION OF CARE:  22:46- Discussed planned course of treatment with the patient, including a left foot X-ray, Valium, and Percocet, who is agreeable at this time.  23:00- Medication Orders- diazepam (Valium) tablet 10 mg- once, oxycodone-acetaminophen (Percocet/Roxicet) 5-325 mg per tablet 2 tablet- once.  Labs Reviewed - No data to display Dg Foot Complete Left  11/03/2012  *RADIOLOGY REPORT*  Clinical Data: Left-sided foot pain.  LEFT FOOT - COMPLETE 3+ VIEW  Comparison: Left to third toe radiographs 04/08/2010.  Findings: Three views of the left foot demonstrate no acute displaced fracture,  subluxation or dislocation.  Again noted is a tiny metallic radiopaque foreign body in the plantar soft tissues along the medial aspect of the foot underlying the distal first metatarsal (this is similar to prior study from 2011).  IMPRESSION: 1.  No acute radiographic abnormality of the left foot. 2.  Tiny retained metallic foreign body in the soft tissues of the foot redemonstrated, as above.   Original Report Authenticated By: Trudie Reed, M.D.      1. Foot pain   2. Sciatica       MDM  Carmyn P Fleury presents with nontraumatic foot pain.  Patient with pain in left foot in the arch, no erythema.  No concern for gout.  Potentially plantar fasciitis; however patient is without significant physical activity to aggravate this.  X-ray with chronic retained metallic foreign body however this is not in the location of her pain.  We'll begin her on NSAIDs, place a postop shoe for protection and will have her followup with orthopedics.  1. Medications: Naprosyn, usual home medications including your pain medication and muscle relaxers for back pain 2. Treatment: rest, drink plenty of fluids, where a postop shoe until evaluated by orthopedics, rest, ice, elevate, compress with Ace wrap 3. Follow Up: Please followup with your primary doctor for discussion of your diagnoses and further evaluation after today's visit; if you do not have a primary care doctor use the resource guide provided to find one; followup with orthopedics on Monday  I personally performed the services described in this documentation, which was scribed in my presence. The recorded information has been reviewed and is accurate.       Dahlia Client Autumm Hattery, PA-C 11/03/12 2352

## 2012-11-03 NOTE — ED Notes (Signed)
Patient complaining of left foot and ankle pain.  Patient denies any recent injury.  Patient reports that she is having trouble bearing weight on her left foot.  Reports mild swelling.  Patient reports elevating foot and taking pain medication; has provided little to no relief.

## 2012-11-03 NOTE — ED Notes (Signed)
Pt c/o left foot pain "in the arch." No swelling, deformity, or discoloration noted. Pt states pain is worse with palpation and ambulation. NAD noted. Pt also c/o chronic back pain and left hip pain.

## 2012-11-04 NOTE — ED Provider Notes (Signed)
Medical screening examination/treatment/procedure(s) were performed by non-physician practitioner and as supervising physician I was immediately available for consultation/collaboration.   Carleene Cooper III, MD 11/04/12 1248

## 2012-11-04 NOTE — ED Notes (Signed)
The patient is AOx4 and comfortable with her discharge instructions.  Her companion is driving her home.

## 2013-11-13 ENCOUNTER — Emergency Department (HOSPITAL_COMMUNITY)
Admission: EM | Admit: 2013-11-13 | Discharge: 2013-11-13 | Disposition: A | Discharge status: Home / Self Care | Payer: Medicare Other | Source: Home / Self Care | Attending: Emergency Medicine | Admitting: Emergency Medicine

## 2013-11-13 ENCOUNTER — Encounter (HOSPITAL_COMMUNITY): Payer: Self-pay | Admitting: Emergency Medicine

## 2013-11-13 ENCOUNTER — Emergency Department (INDEPENDENT_AMBULATORY_CARE_PROVIDER_SITE_OTHER): Payer: Medicare Other

## 2013-11-13 DIAGNOSIS — S20229A Contusion of unspecified back wall of thorax, initial encounter: Secondary | ICD-10-CM

## 2013-11-13 DIAGNOSIS — S300XXA Contusion of lower back and pelvis, initial encounter: Secondary | ICD-10-CM

## 2013-11-13 MED ORDER — HYDROCODONE-ACETAMINOPHEN 5-325 MG PO TABS
1.0000 | ORAL_TABLET | Freq: Four times a day (QID) | ORAL | Status: DC | PRN
Start: 2013-11-13 — End: 2016-08-14

## 2013-11-13 MED ORDER — NAPROXEN 375 MG PO TABS: 375.0000 mg | ORAL_TABLET | Freq: Two times a day (BID) | ORAL | Status: DC | PRN

## 2013-11-13 NOTE — ED Provider Notes (Signed)
CSN: [id number]     Arrival date & time 11/13/13  1910 History   First MD Initiated Contact with Patient 11/13/13 2118     Chief Complaint  Patient presents with  . Fall   (Consider location/radiation/quality/duration/timing/severity/associated sxs/prior Treatment) HPI Comments: Patient reports she slipped and fell on some icy ground on 11/10/2013 and landed on her back. Denies head trauma or LOC. States she was able to stand and ambulate to her house unassisted after fall but that she has had persistent lower back discomfort since her fall. States discomfort now radiates to her left buttock. She is most uncomfortable when standing or walking. She has taken 1 dose of aspirin and 2 doses of flexeril with little relief. Reports two previous lumbar spine surgeries (remote) while living in [place] but is currently followed for chronic back pain/issues by [employer].  PCP: [place].  Denies bowel or bladder incontinence. Denies saddle anesthesia. Reports some wandering paraesthesias in right lower extremity on 11/11/2013, but these have since resolved. Denies any changes in strength or sensation of LLE.  Also wishes to mention that she bumped her left elbow on the ground during the fall and it has also been mildly tender since fall.   The history is provided by the patient.    Past Medical History  Diagnosis Date  . Coronary artery disease   . Myocardial infarction   . Hypertension   . Depression    Past Surgical History  Procedure Laterality Date  . Abdominal hysterectomy    . Back surgery     No family history on file. History  Substance Use Topics  . Smoking status: Never Smoker   . Smokeless tobacco: Not on file  . Alcohol Use: Yes     Comment: rare   OB History   Grav Para Term Preterm Abortions TAB SAB Ect Mult Living                 Review of Systems  All other systems reviewed and are negative.    Allergies  Review of patient's allergies  indicates no known allergies.  Home Medications   Current Outpatient Rx  Name  Route  Sig  Dispense  Refill  . aspirin 81 MG chewable tablet   Oral   Chew 81 mg by mouth daily.         . hydrochlorothiazide (MICROZIDE) 12.5 MG capsule   Oral   Take 12.5 mg by mouth daily.         [name] HYDROcodone-acetaminophen (NORCO/VICODIN) 5-325 MG per tablet   Oral   Take 1 tablet by mouth every 6 (six) hours as needed for moderate pain.   15 tablet   0   . HYDROcodone-acetaminophen (VICODIN) 5-500 MG per tablet   Oral   Take 1 tablet by mouth every 6 (six) hours as needed. For pain         . metoprolol tartrate (LOPRESSOR) 25 MG tablet   Oral   Take 25 mg by mouth 2 (two) times daily.         . Multiple Vitamin (MULTIVITAMIN WITH MINERALS) TABS   Oral   Take 1 tablet by mouth daily.         . naproxen (NAPROSYN) 375 MG tablet   Oral   Take 1 tablet (375 mg total) by mouth 2 (two) times daily as needed for mild pain or moderate pain.   20 tablet   0   . naproxen (NAPROSYN) 500 MG tablet  Oral   Take 1 tablet (500 mg total) by mouth 2 (two) times daily with a meal.   30 tablet   0   . potassium chloride (MICRO-K) 10 MEQ CR capsule   Oral   Take 10 mEq by mouth daily.         . vitamin C (ASCORBIC ACID) 500 MG tablet   Oral   Take 500 mg by mouth daily.          BP 143/93  Pulse 73  Temp(Src) 97.9 F (36.6 C) (Oral)  Resp 20  SpO2 96% Physical Exam  Nursing note and vitals reviewed. Constitutional: She is oriented to person, place, and time. She appears well-developed and well-nourished. No distress.  HENT:  Head: Normocephalic and atraumatic.  Right Ear: External ear normal.  Left Ear: External ear normal.  Nose: Nose normal.  Eyes: Conjunctivae are normal.  Neck: Normal range of motion. Neck supple.  Cardiovascular: Normal rate, regular rhythm and normal heart sounds.   Pulmonary/Chest: Effort normal and breath sounds normal.  Abdominal: Soft.  Bowel sounds are normal. She exhibits no distension. There is no tenderness.  Musculoskeletal: Normal range of motion.       Left elbow: She exhibits normal range of motion, no swelling, no effusion, no deformity and no laceration. Tenderness found. Olecranon process tenderness noted.       Back:  Mild discomfort with palpation.   Neurological: She is alert and oriented to person, place, and time.  Skin: Skin is warm and dry. No rash noted.  Psychiatric: She has a normal mood and affect. Her behavior is normal.    ED Course  Procedures (including critical care time) Labs Review Labs Reviewed - No data to display Imaging Review Dg Lumbar Spine Complete  11/13/2013   CLINICAL DATA:  Fall on Saturday, persistent back pain traveling to left leg. Numbness in right leg.  EXAM: LUMBAR SPINE - COMPLETE 4+ VIEW  COMPARISON:  CT L SPINE W/CM dated 01/18/2011  FINDINGS: Vertebral bodies appear intact, with maintenance of lumbar lordosis. Grade 1 L3-4 anterolisthesis persists. L5-S1 interbody cage with bridging bone mineral density. Lower lumbar facet arthropathy, similar.  No destructive bony lesions. No osseous neural foraminal narrowing. Aortoiliac vascular calcifications. Phleboliths in the pelvis.  IMPRESSION: No acute lumbar spine fracture deformity. Stable grade 1 L3-4 anterolisthesis.   Electronically Signed   By: Elon Alas   On: 11/13/2013 22:09     MDM   1. Lumbar contusion    Lumbar contusion: films without evidence of acute injury and exam without neurological deficit. Will advise treatment at home with naprosyn for mild to moderate pain and vicodin as directed for moderate pain. PCP or ortho follow up if symptoms persist.   Lahoma Rocker, [place] 11/13/13 2220

## 2013-11-13 NOTE — ED Notes (Signed)
Patient had a fall on Saturday.  Fell on snow on Saturday, landing on the ground.  Reports she fell backwards onto ground.  C/o low back pain, pain into right leg with intermittent numbness, and left hip pain.

## 2013-11-13 NOTE — Discharge Instructions (Signed)
Contusion A contusion is a deep bruise. Contusions are the result of an injury that caused bleeding under the skin. The contusion may turn blue, purple, or yellow. Minor injuries will give you a painless contusion, but more severe contusions may stay painful and swollen for a few weeks.  CAUSES  A contusion is usually caused by a blow, trauma, or direct force to an area of the body. SYMPTOMS   Swelling and redness of the injured area.  Bruising of the injured area.  Tenderness and soreness of the injured area.  Pain. DIAGNOSIS  The diagnosis can be made by taking a history and physical exam. An X-ray, CT scan, or MRI may be needed to determine if there were any associated injuries, such as fractures. TREATMENT  Specific treatment will depend on what area of the body was injured. In general, the best treatment for a contusion is resting, icing, elevating, and applying cold compresses to the injured area. Over-the-counter medicines may also be recommended for pain control. Ask your caregiver what the best treatment is for your contusion. HOME CARE INSTRUCTIONS   Put ice on the injured area.  Put ice in a plastic bag.  Place a towel between your skin and the bag.  Leave the ice on for 15-20 minutes, 03-04 times a day.  Only take over-the-counter or prescription medicines for pain, discomfort, or fever as directed by your caregiver. Your caregiver may recommend avoiding anti-inflammatory medicines (aspirin, ibuprofen, and naproxen) for 48 hours because these medicines may increase bruising.  Rest the injured area.  If possible, elevate the injured area to reduce swelling. SEEK IMMEDIATE MEDICAL CARE IF:   You have increased bruising or swelling.  You have pain that is getting worse.  Your swelling or pain is not relieved with medicines. MAKE SURE YOU:   Understand these instructions.  Will watch your condition.  Will get help right away if you are not doing well or get  worse. Document Released: 06/09/2005 Document Revised: 11/22/2011 Document Reviewed: 07/05/2011 [facility] Patient Information 2014 [place], [place].  Your xrays were without evidence of bony injury to your spine. You can expect to be stiff and sore for the next several days. Please use medication as prescribed for pain. If symptoms do not begin to improve over the next 1-2 weeks, please follow up with either your primary care doctor or with your doctor at [facility].

## 2013-11-13 NOTE — ED Provider Notes (Signed)
Medical screening examination/treatment/procedure(s) were performed by non-physician practitioner and as supervising physician I was immediately available for consultation/collaboration.  Philipp Deputy, M.D.  Harden Mo, MD 11/13/13 [phone]

## 2013-12-19 NOTE — ED Notes (Signed)
Billing question . 

## 2014-04-14 ENCOUNTER — Encounter (HOSPITAL_COMMUNITY): Payer: Self-pay | Admitting: Emergency Medicine

## 2014-04-14 ENCOUNTER — Emergency Department (HOSPITAL_COMMUNITY): Payer: Medicare Other

## 2014-04-14 ENCOUNTER — Emergency Department (HOSPITAL_COMMUNITY)
Admission: EM | Admit: 2014-04-14 | Discharge: 2014-04-14 | Disposition: A | Discharge status: Home / Self Care | Payer: Medicare Other | Attending: Emergency Medicine | Admitting: Emergency Medicine

## 2014-04-14 DIAGNOSIS — I10 Essential (primary) hypertension: Secondary | ICD-10-CM | POA: Insufficient documentation

## 2014-04-14 DIAGNOSIS — Z791 Long term (current) use of non-steroidal anti-inflammatories (NSAID): Secondary | ICD-10-CM | POA: Diagnosis not present

## 2014-04-14 DIAGNOSIS — I2510 Atherosclerotic heart disease of native coronary artery without angina pectoris: Secondary | ICD-10-CM | POA: Diagnosis not present

## 2014-04-14 DIAGNOSIS — Z79899 Other long term (current) drug therapy: Secondary | ICD-10-CM | POA: Diagnosis not present

## 2014-04-14 DIAGNOSIS — R079 Chest pain, unspecified: Secondary | ICD-10-CM

## 2014-04-14 DIAGNOSIS — Z8659 Personal history of other mental and behavioral disorders: Secondary | ICD-10-CM | POA: Insufficient documentation

## 2014-04-14 DIAGNOSIS — M7989 Other specified soft tissue disorders: Secondary | ICD-10-CM

## 2014-04-14 DIAGNOSIS — R1011 Right upper quadrant pain: Secondary | ICD-10-CM | POA: Diagnosis not present

## 2014-04-14 DIAGNOSIS — Z7982 Long term (current) use of aspirin: Secondary | ICD-10-CM | POA: Diagnosis not present

## 2014-04-14 DIAGNOSIS — I252 Old myocardial infarction: Secondary | ICD-10-CM | POA: Insufficient documentation

## 2014-04-14 DIAGNOSIS — M79609 Pain in unspecified limb: Secondary | ICD-10-CM

## 2014-04-14 LAB — CBC
HEMATOCRIT: 43.1 % (ref 36.0–46.0)
Hemoglobin: 14.6 g/dL (ref 12.0–15.0)
MCH: 28.6 pg (ref 26.0–34.0)
MCHC: 33.9 g/dL (ref 30.0–36.0)
MCV: 84.3 fL (ref 78.0–100.0)
Platelets: 345 10*3/uL (ref 150–400)
RBC: 5.11 MIL/uL (ref 3.87–5.11)
RDW: 13.8 % (ref 11.5–15.5)
WBC: 4.8 10*3/uL (ref 4.0–10.5)

## 2014-04-14 LAB — BASIC METABOLIC PANEL
Anion gap: 12 (ref 5–15)
BUN: 7 mg/dL (ref 6–23)
CALCIUM: 9.4 mg/dL (ref 8.4–10.5)
CO2: 25 mEq/L (ref 19–32)
CREATININE: 0.83 mg/dL (ref 0.50–1.10)
Chloride: 104 mEq/L (ref 96–112)
GFR, EST AFRICAN AMERICAN: 87 mL/min — AB (ref >90)
GFR, EST NON AFRICAN AMERICAN: 75 mL/min — AB (ref >90)
Glucose, Bld: 101 mg/dL — ABNORMAL HIGH (ref 70–99)
POTASSIUM: 4 meq/L (ref 3.7–5.3)
Sodium: 141 mEq/L (ref 137–147)

## 2014-04-14 LAB — I-STAT TROPONIN, ED
Troponin i, poc: 0 ng/mL (ref 0.00–0.08)
Troponin i, poc: 0 ng/mL (ref 0.00–0.08)

## 2014-04-14 MED ORDER — SIMETHICONE 40 MG/0.6ML PO SUSP (UNIT DOSE)
40.0000 mg | Freq: Once | ORAL | Status: AC
  Administered 2014-04-14: 40 mg via ORAL
  Filled 2014-04-14: qty 0.6

## 2014-04-14 MED ORDER — NITROGLYCERIN 0.4 MG SL SUBL
0.4000 mg | SUBLINGUAL_TABLET | SUBLINGUAL | Status: DC | PRN
  Administered 2014-04-14: 0.4 mg via SUBLINGUAL
  Filled 2014-04-14: qty 1

## 2014-04-14 MED ORDER — MORPHINE SULFATE 4 MG/ML IJ SOLN
4.0000 mg | Freq: Once | INTRAMUSCULAR | Status: AC
  Administered 2014-04-14: 4 mg via INTRAVENOUS
  Filled 2014-04-14: qty 1

## 2014-04-14 MED ORDER — KETOROLAC TROMETHAMINE 15 MG/ML IJ SOLN
15.0000 mg | Freq: Once | INTRAMUSCULAR | Status: AC
Start: 2014-04-14 — End: 2014-04-14
  Administered 2014-04-14: 15 mg via INTRAVENOUS
  Filled 2014-04-14: qty 1

## 2014-04-14 MED ORDER — METOPROLOL TARTRATE 25 MG PO TABS: 25.0000 mg | ORAL_TABLET | Freq: Two times a day (BID) | ORAL | Status: DC

## 2014-04-14 MED ORDER — TRAMADOL HCL 50 MG PO TABS: 50.0000 mg | ORAL_TABLET | Freq: Four times a day (QID) | ORAL | Status: DC | PRN

## 2014-04-14 MED ORDER — OXYCODONE-ACETAMINOPHEN 5-325 MG PO TABS: 1.0000 | ORAL_TABLET | ORAL | Status: DC | PRN

## 2014-04-14 NOTE — Discharge Instructions (Signed)
Call for a follow up appointment with a Family or Primary Care Provider.  Call your cardiologist for further evaluation of your chest pain. Return if Symptoms worsen, he develop worsening shortness breath, fever, palpitations, nausea, vomiting, abdominal pain. Take medication as prescribed.

## 2014-04-14 NOTE — ED Provider Notes (Signed)
CSN: [id number]     Arrival date & time 04/14/14  1028 History   First MD Initiated Contact with Patient 04/14/14 1109     Chief Complaint  Patient presents with  . Chest Pain     (Consider location/radiation/quality/duration/timing/severity/associated sxs/prior Treatment) HPI Comments: The patient is a 58-year-old female past no history of hypertension, "small" MI, depression presents emergency room at with a chief complaint of left-sided chest discomfort starting at approximately 0930 while casually walking to the house. The patient reports constant left-sided discomfort worsened with body position. She reports radiation into her left flank/ back. Denies pain with deep inspiration. She denies palpitations, lower extremity edema, cough, fever. She denies taking any thing for pain prior to arrival. Last BM today, denies nausea vomiting diarrhea. She reports last stress test less than 5 years ago.  The patient reports her shortness of breath unchanged from baseline. Patient reports last metoprolol several months ago. PCP: Philis Fendt, MD  Patient is a 58 y.o. female presenting with chest pain. The history is provided by the patient. No language interpreter was used.  Chest Pain Associated symptoms: no cough, no fever, no nausea, no palpitations, no shortness of breath and not vomiting     Past Medical History  Diagnosis Date  . Coronary artery disease   . Myocardial infarction   . Hypertension   . Depression    Past Surgical History  Procedure Laterality Date  . Abdominal hysterectomy    . Back surgery     History reviewed. No pertinent family history. History  Substance Use Topics  . Smoking status: Never Smoker   . Smokeless tobacco: Not on file  . Alcohol Use: Yes     Comment: rare   OB History   Grav Para Term Preterm Abortions TAB SAB Ect Mult Living                 Review of Systems  Constitutional: Negative for fever and chills.  Respiratory: Negative for cough  and shortness of breath.   Cardiovascular: Positive for chest pain. Negative for palpitations and leg swelling.  Gastrointestinal: Negative for nausea, vomiting, diarrhea and constipation.      Allergies  Review of patient's allergies indicates no known allergies.  Home Medications   Prior to Admission medications   Medication Sig Start Date End Date Taking? Authorizing Provider  aspirin 81 MG chewable tablet Chew 81 mg by mouth daily.    Historical Provider, MD  hydrochlorothiazide (MICROZIDE) 12.5 MG capsule Take 12.5 mg by mouth daily.    Historical Provider, MD  HYDROcodone-acetaminophen (NORCO/VICODIN) 5-325 MG per tablet Take 1 tablet by mouth every 6 (six) hours as needed for moderate pain. 11/13/13   Lahoma Rocker, PA  HYDROcodone-acetaminophen (VICODIN) 5-500 MG per tablet Take 1 tablet by mouth every 6 (six) hours as needed. For pain    Historical Provider, MD  metoprolol tartrate (LOPRESSOR) 25 MG tablet Take 25 mg by mouth 2 (two) times daily.    Historical Provider, MD  Multiple Vitamin (MULTIVITAMIN WITH MINERALS) TABS Take 1 tablet by mouth daily.    Historical Provider, MD  naproxen (NAPROSYN) 375 MG tablet Take 1 tablet (375 mg total) by mouth 2 (two) times daily as needed for mild pain or moderate pain. 11/13/13   Lahoma Rocker, PA  naproxen (NAPROSYN) 500 MG tablet Take 1 tablet (500 mg total) by mouth 2 (two) times daily with a meal. 11/03/12   Hannah Muthersbaugh, PA-C  potassium chloride (MICRO-K) 10  MEQ CR capsule Take 10 mEq by mouth daily.    Historical Provider, MD  vitamin C (ASCORBIC ACID) 500 MG tablet Take 500 mg by mouth daily.    Historical Provider, MD   BP 188/100  Pulse 85  Temp(Src) 97.8 F (36.6 C) (Oral)  Resp 22  SpO2 98% Physical Exam  Nursing note and vitals reviewed. Constitutional: She appears well-developed and well-nourished.  Appears uncomfortable  HENT:  Head: Normocephalic and atraumatic.  Eyes: EOM are normal.  Neck: Neck  supple.  Cardiovascular: Normal rate and regular rhythm.   No lower extremity edema. Superficial tenderness to spider veins over anterior tibialis. No calf tenderness.  Pulmonary/Chest: Effort normal and breath sounds normal. No respiratory distress. She has no wheezes. She has no rales. She exhibits no tenderness.  Patient is able to speak in complete sentences.   Abdominal: Soft. She exhibits no distension. There is tenderness in the right upper quadrant. There is no rebound and no guarding.  Skin: Skin is dry. She is not diaphoretic.  Psychiatric: She has a normal mood and affect. Her behavior is normal.    ED Course  Procedures (including critical care time) Labs Review Results for orders placed during the hospital encounter of 04/14/14  CBC      Result Value Ref Range   WBC 4.8  4.0 - 10.5 K/uL   RBC 5.11  3.87 - 5.11 MIL/uL   Hemoglobin 14.6  12.0 - 15.0 g/dL   HCT 43.1  36.0 - 46.0 %   MCV 84.3  78.0 - 100.0 fL   MCH 28.6  26.0 - 34.0 pg   MCHC 33.9  30.0 - 36.0 g/dL   RDW 13.8  11.5 - 15.5 %   Platelets 345  150 - 400 K/uL  BASIC METABOLIC PANEL      Result Value Ref Range   Sodium 141  137 - 147 mEq/L   Potassium 4.0  3.7 - 5.3 mEq/L   Chloride 104  96 - 112 mEq/L   CO2 25  19 - 32 mEq/L   Glucose, Bld 101 (*) 70 - 99 mg/dL   BUN 7  6 - 23 mg/dL   Creatinine, Ser 0.83  0.50 - 1.10 mg/dL   Calcium 9.4  8.4 - 10.5 mg/dL   GFR calc non Af Amer 75 (*) >90 mL/min   GFR calc Af Amer 87 (*) >90 mL/min   Anion gap 12  5 - 15  I-STAT TROPOININ, ED      Result Value Ref Range   Troponin i, poc 0.00  0.00 - 0.08 ng/mL   Comment 3           I-STAT TROPOININ, ED      Result Value Ref Range   Troponin i, poc 0.00  0.00 - 0.08 ng/mL   Comment 3            Imaging Review Dg Chest 2 View  04/14/2014   CLINICAL DATA:  Left-sided chest pain  EXAM: CHEST  2 VIEW  COMPARISON:  07/27/2012  FINDINGS: The heart size and mediastinal contours are within normal limits. Both lungs are  clear. The visualized skeletal structures are unremarkable. The aorta is unfolded and ectatic.  IMPRESSION: No active cardiopulmonary disease.   Electronically Signed   By: Conchita Paris M.D.   On: 04/14/2014 11:56     Date: 04/14/2014  Rate: 88   Rhythm: normal sinus rhythm  QRS Axis: normal  Intervals: normal  ST/T Wave  abnormalities: normal  Conduction Disutrbances:none  Narrative Interpretation:   Old EKG Reviewed: unchanged    MDM   Final diagnoses:  Chest pain, unspecified chest pain type   Patient presents with atypical left-sided chest discomfort worsened with body position. Reevaluation patient resting comfortably in room. She reports left-sided chest discomfort only with movement. No further discomfort at rest. X-ray shows a  EMR shows ECHO 11/14/2009 EF 45-50%, septal and inferior hypokinesia, mildly elevated LV. Reevaluation patient reports mild resolution of symptoms, stating only left sided chest discomfort with movement. Troponin x2 negative. Plan to discharge and followup with cardiology. Dr. Jeneen Rinks also evaluated the patient during this encounter. Discussed lab results, imaging results, and treatment plan with the patient. Return precautions given. Reports understanding and no other concerns at this time.  Patient is stable for discharge at this time. Meds given in ED:  Medications  simethicone (MYLICON) 40 NI/7.7OE suspension 40 mg (40 mg Oral Given 04/14/14 1309)  morphine 4 MG/ML injection 4 mg (4 mg Intravenous Given 04/14/14 1215)  morphine 4 MG/ML injection 4 mg (4 mg Intravenous Given 04/14/14 1334)  ketorolac (TORADOL) 15 MG/ML injection 15 mg (15 mg Intravenous Given 04/14/14 1334)    Discharge Medication List as of 04/14/2014  2:41 PM    START taking these medications   Details  !! metoprolol (LOPRESSOR) 25 MG tablet Take 1 tablet (25 mg total) by mouth 2 (two) times daily., Starting 04/14/2014, Until Discontinued, Print    oxyCODONE-acetaminophen  (PERCOCET/ROXICET) 5-325 MG per tablet Take 1 tablet by mouth every 4 (four) hours as needed for moderate pain or severe pain., Starting 04/14/2014, Until Discontinued, Print     !! - Potential duplicate medications found. Please discuss with provider.           Lorrine Kin, PA-C 04/14/14 2007

## 2014-04-14 NOTE — ED Provider Notes (Addendum)
Pt with lt anterior and lateral chest pain.  Sates present since 08:00 this morning.  Prior episodes over the "last few years".  Pt reports hospitalization in [place] in '01.  Cannot recall diagnostics, but denies stents.  Uncertain if angiogram done.  Cardiology consult here 2012 states negative Myoview '05, and '10.  No additional symptoms today.  EKG normal.  First troponin normal.  HEART score 3 (Moderate suspicious Hx, age 60-65, and 1-2 risks (FH, HTN)).  If delta troponin nojral, I agree with outpatient follow-up.  Pt not dyspneic, tachycadic, and no PE risks, Wells score 0.0.  Not febrile or hypoxemic. CXR normal.   Tanna Furry, MD 04/14/14 1356  Tanna Furry, MD 04/14/14 [phone]

## 2014-04-14 NOTE — ED Notes (Signed)
Pt c/o left sided CP into her left side starting this am; pt sts worse with movement

## 2014-04-14 NOTE — Progress Notes (Signed)
VASCULAR LAB PRELIMINARY  PRELIMINARY  PRELIMINARY  PRELIMINARY  Left lower extremity venous Doppler completed.    Preliminary report:  There is no DVT or SVT noted in the left lower extremity.   Nero Sawatzky, RVT 04/14/2014, 12:50 PM

## 2014-04-16 ENCOUNTER — Encounter: Payer: Self-pay | Admitting: Interventional Cardiology

## 2014-04-16 ENCOUNTER — Ambulatory Visit (INDEPENDENT_AMBULATORY_CARE_PROVIDER_SITE_OTHER): Payer: Medicare Other | Admitting: Interventional Cardiology

## 2014-04-16 VITALS — BP 138/90 | HR 71 | Ht 64.0 in | Wt 192.0 lb

## 2014-04-16 DIAGNOSIS — Z8249 Family history of ischemic heart disease and other diseases of the circulatory system: Secondary | ICD-10-CM

## 2014-04-16 DIAGNOSIS — I10 Essential (primary) hypertension: Secondary | ICD-10-CM | POA: Insufficient documentation

## 2014-04-16 DIAGNOSIS — R079 Chest pain, unspecified: Secondary | ICD-10-CM

## 2014-04-16 NOTE — Progress Notes (Signed)
Patient ID: Tanya Walsh, female   DOB: Apr 14, 1956, 58 y.o.   MRN: [id number]   Date: 04/16/2014 ID: Tanya Walsh, DOB [birth year], MRN [id number] PCP: Philis Fendt, MD  Reason: Chest discomfort  ASSESSMENT;  1. chest pain with components compatible with angina but many of the atypical features 2. 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 History of hypertension 4. Family history of CAD 5. Tachycardia palpitations appear  PLAN:  1. Stress Cardiolite 2. If we demonstrate no evidence of ischemia/prior infarction, it may be necessary to do an evaluation for arrhythmia using prolonged monitoring.   SUBJECTIVE: Tanya Walsh is a 58 y.o. female who is here for evaluation of chest pain that occurred suddenly on Sunday. She was getting ready to go to a meeting. She was in her closet and bent over to get her shoes. When she stood up she developed a left subclavicular and parasternal pressure-type discomfort. There was also pain in the lower left chest that radiated around the rib cage. This discomfort was palpably uncomfortable and made worse with deep breathing. A tightness in the chest in the subclavicular area did not resolve. The subclavicular discomfort has been previously evaluated with a nuclear test in 2005 and was unremarkable. On further investigation of her prior records, a regional wall motion abnormality was noted on echo done in 2011 as part of a stroke workup.   No Known Allergies  Current Outpatient Prescriptions on File Prior to Visit  Medication Sig Dispense Refill  . aspirin 81 MG chewable tablet Chew 81 mg by mouth daily.      . hydrochlorothiazide (MICROZIDE) 12.5 MG capsule Take 12.5 mg by mouth daily.      [name] HYDROcodone-acetaminophen (NORCO/VICODIN) 5-325 MG per tablet Take 1 tablet by mouth every 6 (six) hours as needed for moderate pain.  15 tablet  0  . metoprolol tartrate (LOPRESSOR) 25 MG tablet Take 25 mg by mouth 2 (two) times  daily.      . metoprolol tartrate (LOPRESSOR) 25 MG tablet Take 1 tablet (25 mg total) by mouth 2 (two) times daily.  60 tablet  0  . Multiple Vitamin (MULTIVITAMIN WITH MINERALS) TABS Take 1 tablet by mouth daily.      . potassium chloride (MICRO-K) 10 MEQ CR capsule Take 10 mEq by mouth daily.      . traMADol (ULTRAM) 50 MG tablet Take 1 tablet (50 mg total) by mouth every 6 (six) hours as needed.  10 tablet  0   No current facility-administered medications on file prior to visit.    Past Medical History  Diagnosis Date  . Coronary artery disease   . Myocardial infarction   . Hypertension   . Depression     Past Surgical History  Procedure Laterality Date  . Abdominal hysterectomy    . Back surgery      History   Social History  . Marital Status: Widowed    Spouse Name: N/A    Number of Children: N/A  . Years of Education: N/A   Occupational History  . Not on file.   Social History Main Topics  . Smoking status: Never Smoker   . Smokeless tobacco: Not on file  . Alcohol Use: Yes     Comment: rare  . Drug Use: No  . Sexual Activity: Not on file   Other Topics Concern  . Not on file   Social History Narrative  . No narrative on file    No  family history on file.  ROS: No history of MI. Previous stroke workup was unremarkable. Denies orthopnea, PND, but does have palpitations and racing heart to. Other systems negative for complaints.  OBJECTIVE: BP 138/90  Pulse 71  Ht 5\' 4"  (1.626 m)  Wt 192 lb (87.091 kg)  BMI 32.94 kg/m2,  General: No acute distress, obese HEENT: normal without jaundice or pallor Neck: JVD flat. Carotids absent Chest: Clear to auscultation and percussion. Trigger point in the left mid axillary line and 11th rib where there is inducible tenderness Cardiac: Murmur: Absent. Gallop: Absent. Rhythm: Normal. Other: Normal Abdomen: Bruit: Absent. Pulsation: Absent Extremities: Edema: Absent. Pulses: 2+ and symmetric Neuro: Normal Psych:  Depressed appearing  ECG: ECGs performed during the ER visit on 04/14/2014 at 1 remarkable  Echocardiogram: 2011 Study Conclusions  - Left ventricle: Septal and inferobasal hypokinesis The cavity size was mildly dilated. Wall thickness was increased in a pattern of mild LVH. Systolic function was mildly reduced. The estimated ejection fraction was in the range of 45% to 50%. - Mitral valve: Mild regurgitation. - Left atrium: The atrium was mildly dilated. - Atrial septum: No defect or patent foramen ovale was identified.

## 2014-04-16 NOTE — Patient Instructions (Signed)
Your physician recommends that you continue on your current medications as directed. Please refer to the Current Medication list given to you today.  Your physician has requested that you have en exercise stress myoview. For further information please visit [email or web address]. Please follow instruction sheet, as given.  Follow up pending results  

## 2014-04-17 ENCOUNTER — Encounter: Payer: Self-pay | Admitting: Cardiology

## 2014-04-18 ENCOUNTER — Ambulatory Visit (HOSPITAL_COMMUNITY): Payer: Medicare Other | Attending: Cardiology | Admitting: Radiology

## 2014-04-18 VITALS — BP 145/101 | HR 58 | Ht 64.0 in | Wt 192.0 lb

## 2014-04-18 DIAGNOSIS — R079 Chest pain, unspecified: Secondary | ICD-10-CM | POA: Insufficient documentation

## 2014-04-18 DIAGNOSIS — I2510 Atherosclerotic heart disease of native coronary artery without angina pectoris: Secondary | ICD-10-CM | POA: Diagnosis not present

## 2014-04-18 DIAGNOSIS — Z8249 Family history of ischemic heart disease and other diseases of the circulatory system: Secondary | ICD-10-CM | POA: Diagnosis not present

## 2014-04-18 MED ORDER — TECHNETIUM TC 99M SESTAMIBI GENERIC - CARDIOLITE
11.0000 | Freq: Once | INTRAVENOUS | Status: AC | PRN
  Administered 2014-04-18: 11 via INTRAVENOUS

## 2014-04-18 MED ORDER — TECHNETIUM TC 99M SESTAMIBI GENERIC - CARDIOLITE: 11.0000 | Freq: Once | INTRAVENOUS | Status: DC | PRN

## 2014-04-18 MED ORDER — TECHNETIUM TC 99M SESTAMIBI GENERIC - CARDIOLITE: 33.0000 | Freq: Once | INTRAVENOUS | Status: DC | PRN

## 2014-04-18 MED ORDER — TECHNETIUM TC 99M SESTAMIBI GENERIC - CARDIOLITE
33.0000 | Freq: Once | INTRAVENOUS | Status: AC | PRN
  Administered 2014-04-18: 33 via INTRAVENOUS

## 2014-04-18 MED ORDER — REGADENOSON 0.4 MG/5ML IV SOLN: 0.4000 mg | Freq: Once | INTRAVENOUS | Status: DC

## 2014-04-18 NOTE — Progress Notes (Signed)
[facility] [address] [place], [place] [address] [phone]    Cardiology Nuclear Med Study  Tanya Walsh is a 58 y.o. female     MRN : [id number]     DOB: 1956-08-28  Procedure Date: 04/18/2014  Nuclear Med Background Indication for Stress Test:  Evaluation for Ischemia History:  CAD, MI, Echo 2011 EF 45-50%, MPI 2010 (normal) EF 73% Cardiac Risk Factors: Family History - CAD, Hypertension and Lipids  Symptoms:  Chest Pain (last date of chest discomfort was this morning but not a present time)   Nuclear Pre-Procedure Caffeine/Decaff Intake:  None > 12 hrs NPO After: 9:00pm   Lungs:  clear O2 Sat: 92% on room air. IV 0.9% NS with Angio Cath:  22g  IV Site: R Antecubital x 1, tolerated well IV Started by:  Irven Baltimore, RN  Chest Size (in):  38 Cup Size: B  Height: 5\' 4"  (1.626 m)  Weight:  192 lb (87.091 kg)  BMI:  Body mass index is 32.94 kg/(m^2). Tech Comments:  Patient held Metoprolol x 24 hrs. Irven Baltimore, RN.    Nuclear Med Study 1 or 2 day study: 1 day  Stress Test Type:  Stress  Reading MD: N/A  Order Authorizing Provider:  Daneen Schick, III MD  Resting Radionuclide: Technetium 46m Sestamibi  Resting Radionuclide Dose: 11.0 mCi   Stress Radionuclide:  Technetium 45m Sestamibi  Stress Radionuclide Dose: 33.0 mCi           Stress Protocol Rest HR: 58 Stress HR: 150  Rest BP: 145/101 Stress BP: 164/104  Exercise Time (min): 7:15 METS: 8.9           Dose of Adenosine (mg):  n/a Dose of Lexiscan: n/a mg  Dose of Atropine (mg): n/a Dose of Dobutamine: n/a mcg/kg/min (at max HR)  Stress Test Technologist: Glade Lloyd, BS-ES  Nuclear Technologist:  Annye Rusk, CNMT     Rest Procedure:  Myocardial perfusion imaging was performed at rest 45 minutes following the intravenous administration of Technetium 74m Sestamibi. Rest ECG: NSR - Normal EKG  Stress Procedure:  The patient exercised on the treadmill utilizing the  Bruce Protocol for 7:15 minutes. The patient stopped due to fatigue and denied any chest pain.  Technetium 40m Sestamibi was injected at peak exercise and myocardial perfusion imaging was performed after a brief delay. Stress ECG: No significant change from baseline ECG  QPS Raw Data Images:  Normal; no motion artifact; normal heart/lung ratio. Stress Images:  Normal homogeneous uptake in all areas of the myocardium. Rest Images:  Normal homogeneous uptake in all areas of the myocardium. Subtraction (SDS):  Normal Transient Ischemic Dilatation (Normal <1.22):  0.92 Lung/Heart Ratio (Normal <0.45):  0.29  Quantitative Gated Spect Images QGS EDV:  89 ml QGS ESV:  36 ml  Impression Exercise Capacity:  Good exercise capacity. BP Response:  Normal blood pressure response. Clinical Symptoms:  No significant symptoms noted. ECG Impression:  No significant ST segment change suggestive of ischemia. Comparison with Prior Nuclear Study: No images to compare  Overall Impression:  Normal stress nuclear study.  LV Ejection Fraction: 60%.  LV Wall Motion:  Normal Wall Motion  Pixie Casino, MD, [facility] Board Certified in Nuclear Cardiology Attending Cardiologist [place]

## 2014-04-18 NOTE — ED Provider Notes (Signed)
Pt evaluated.  Pain reproduced with movement of lt chest ane LUE.  H/O "small MI", although per her report, dx is uncertain.  Minimal pain here.  Pain since this am.  EKG without ischemia.  Normal enzymes.  Pt appropriate for out patient follow up.  Tanna Furry, MD 04/18/14 [phone]

## 2014-04-23 ENCOUNTER — Telehealth: Payer: Self-pay

## 2014-04-23 DIAGNOSIS — R002 Palpitations: Secondary | ICD-10-CM

## 2014-04-23 NOTE — Telephone Encounter (Signed)
called to give pt myoview results.lmtcb 

## 2014-04-23 NOTE — Telephone Encounter (Signed)
Message copied by Lamar Laundry on Tue Apr 23, 2014  2:00 PM ------      Message from: Daneen Schick      Created: Fri Apr 19, 2014  3:38 PM       Normal blood flow study. Nothing to suggest a there are blocked arteries. She needs to wear a 30 day continuous ambulatory monitor. Indication is palpitations with symptoms ------

## 2014-04-23 NOTE — Telephone Encounter (Signed)
Pt aware of myoview results.Pt aware of myoview results.Normal blood flow study. Nothing to suggest a there are blocked arteries. She needs to wear a 30 day continuous ambulatory monitor. Indication is palpitations with symptoms.pt aware a scheduler will call her to schedule cardiac monitor.pt verbalized understanding.

## 2014-05-08 ENCOUNTER — Encounter: Payer: Self-pay | Admitting: *Deleted

## 2014-05-08 ENCOUNTER — Encounter (INDEPENDENT_AMBULATORY_CARE_PROVIDER_SITE_OTHER): Payer: Medicare Other

## 2014-05-08 DIAGNOSIS — R002 Palpitations: Secondary | ICD-10-CM

## 2014-05-08 NOTE — Progress Notes (Signed)
Patient ID: Tanya Walsh, female   DOB: Oct 24, 1955, 58 y.o.   MRN: [id number] [employer] 30 day cardiac event monitor applied to patient.

## 2014-06-19 ENCOUNTER — Telehealth: Payer: Self-pay

## 2014-06-19 NOTE — Telephone Encounter (Signed)
called to give pt cardiac results.lmtcb

## 2014-07-30 ENCOUNTER — Telehealth: Payer: Self-pay

## 2014-07-30 NOTE — Telephone Encounter (Signed)
2nd attempt. lmtcb Will send cardiac monitor report to scanning. Dr.Steyer comments are as followed -No Atrial Fib or event noted -W/U is normal

## 2014-07-31 ENCOUNTER — Telehealth: Payer: Self-pay | Admitting: Interventional Cardiology

## 2014-07-31 NOTE — Telephone Encounter (Signed)
NA

## 2014-07-31 NOTE — Telephone Encounter (Signed)
New message    patient calling returning call back to nurse. Will call on tomorrow.  Aware that Lattie Haw is off today.

## 2014-08-01 NOTE — Telephone Encounter (Signed)
Pt aware of cardiac monitor results. -No Atrial Fib or event noted -W/U is normal Pt verbalized understanding

## 2014-12-06 ENCOUNTER — Encounter (HOSPITAL_COMMUNITY): Payer: Self-pay | Admitting: Emergency Medicine

## 2014-12-06 ENCOUNTER — Emergency Department (HOSPITAL_COMMUNITY)
Admission: EM | Admit: 2014-12-06 | Discharge: 2014-12-06 | Disposition: A | Discharge status: Home / Self Care | Payer: Medicare Other | Source: Home / Self Care | Attending: Family Medicine | Admitting: Family Medicine

## 2014-12-06 ENCOUNTER — Emergency Department (INDEPENDENT_AMBULATORY_CARE_PROVIDER_SITE_OTHER): Payer: Medicare Other

## 2014-12-06 DIAGNOSIS — S46912A Strain of unspecified muscle, fascia and tendon at shoulder and upper arm level, left arm, initial encounter: Secondary | ICD-10-CM | POA: Diagnosis not present

## 2014-12-06 DIAGNOSIS — M25512 Pain in left shoulder: Secondary | ICD-10-CM | POA: Diagnosis not present

## 2014-12-06 MED ORDER — DICLOFENAC SODIUM 1 % TD GEL: 4.0000 g | Freq: Four times a day (QID) | TRANSDERMAL | Status: DC

## 2014-12-06 NOTE — Discharge Instructions (Signed)
Sling , ice and medicine as needed, see orthopedist if further problems.

## 2014-12-06 NOTE — ED Provider Notes (Signed)
CSN: [id number]     Arrival date & time 12/06/14  1602 History   First MD Initiated Contact with Patient 12/06/14 1758     Chief Complaint  Patient presents with  . Shoulder Pain   (Consider location/radiation/quality/duration/timing/severity/associated sxs/prior Treatment) Patient is a 59 y.o. female presenting with shoulder pain. The history is provided by the patient.  Shoulder Pain Location:  Shoulder Time since incident:  1 week Injury: no   Shoulder location:  L shoulder Pain details:    Quality:  Shooting   Severity:  Moderate   Onset quality:  Sudden   Progression:  Worsening Chronicity:  New Dislocation: no   Prior injury to area:  No Worsened by:  Nothing tried Ineffective treatments:  None tried Associated symptoms: decreased range of motion and stiffness   Associated symptoms: no back pain     Past Medical History  Diagnosis Date  . Coronary artery disease   . Myocardial infarction   . Hypertension   . Depression    Past Surgical History  Procedure Laterality Date  . Abdominal hysterectomy    . Back surgery     Family History  Problem Relation Age of Onset  . Heart disease Father   . Heart disease Brother    History  Substance Use Topics  . Smoking status: Never Smoker   . Smokeless tobacco: Not on file  . Alcohol Use: Yes     Comment: rare   OB History    No data available     Review of Systems  Constitutional: Negative.   Musculoskeletal: Positive for stiffness. Negative for back pain and joint swelling.  Skin: Negative.     Allergies  Review of patient's allergies indicates no known allergies.  Home Medications   Prior to Admission medications   Medication Sig Start Date End Date Taking? Authorizing Provider  aspirin 81 MG chewable tablet Chew 81 mg by mouth daily.    Historical Provider, MD  diclofenac sodium (VOLTAREN) 1 % GEL Apply 4 g topically 4 (four) times daily. To shoulder, please instruct in dosing. 12/06/14   Billy Fischer, MD  hydrochlorothiazide (MICROZIDE) 12.5 MG capsule Take 12.5 mg by mouth daily.    Historical Provider, MD  HYDROcodone-acetaminophen (NORCO/VICODIN) 5-325 MG per tablet Take 1 tablet by mouth every 6 (six) hours as needed for moderate pain. 11/13/13   Audelia Hives Presson, PA  metoprolol tartrate (LOPRESSOR) 25 MG tablet Take 25 mg by mouth 2 (two) times daily.    Historical Provider, MD  metoprolol tartrate (LOPRESSOR) 25 MG tablet Take 1 tablet (25 mg total) by mouth 2 (two) times daily. 04/14/14   Harvie Heck, PA-C  Multiple Vitamin (MULTIVITAMIN WITH MINERALS) TABS Take 1 tablet by mouth daily.    Historical Provider, MD  potassium chloride (MICRO-K) 10 MEQ CR capsule Take 10 mEq by mouth daily.    Historical Provider, MD  traMADol (ULTRAM) 50 MG tablet Take 1 tablet (50 mg total) by mouth every 6 (six) hours as needed. 04/14/14   Lauren Parker, PA-C   BP 158/110 mmHg  Pulse 72  Temp(Src) 98.1 F (36.7 C) (Oral)  Resp 18  SpO2 98% Physical Exam  Constitutional: She is oriented to person, place, and time. She appears well-developed and well-nourished. She appears distressed.  Musculoskeletal: She exhibits tenderness.       Left shoulder: She exhibits decreased range of motion, tenderness, bony tenderness, pain and decreased strength. She exhibits no swelling, no effusion, no deformity, no  laceration and normal pulse.       Arms: Neurological: She is alert and oriented to person, place, and time.  Skin: Skin is warm and dry.  Nursing note and vitals reviewed.   ED Course  Procedures (including critical care time) Labs Review Labs Reviewed - No data to display  Imaging Review Dg Shoulder Left  12/06/2014   CLINICAL DATA:  One week history of left shoulder pain. No history of trauma  EXAM: LEFT SHOULDER - 2+ VIEW  COMPARISON:  None.  FINDINGS: Frontal, oblique, and Y scapular images were obtained. There is no fracture or dislocation. Joint spaces appear intact. No erosive  change.  IMPRESSION: No fracture or dislocation.  No apparent arthropathy.   Electronically Signed   By: Lowella Grip III M.D.   On: 12/06/2014 18:06     MDM   1. Left shoulder strain, initial encounter        Billy Fischer, MD 12/07/14 1000

## 2014-12-06 NOTE — ED Notes (Signed)
Pt states that her left shoulder has been hurting her  For more than a week pt denies any injury or fall.

## 2015-04-18 DIAGNOSIS — R7309 Other abnormal glucose: Secondary | ICD-10-CM | POA: Diagnosis not present

## 2015-04-18 DIAGNOSIS — Z1239 Encounter for other screening for malignant neoplasm of breast: Secondary | ICD-10-CM | POA: Diagnosis not present

## 2015-04-18 DIAGNOSIS — R600 Localized edema: Secondary | ICD-10-CM | POA: Diagnosis not present

## 2015-04-18 DIAGNOSIS — I10 Essential (primary) hypertension: Secondary | ICD-10-CM | POA: Diagnosis not present

## 2015-04-18 DIAGNOSIS — R002 Palpitations: Secondary | ICD-10-CM | POA: Diagnosis not present

## 2015-04-18 DIAGNOSIS — F329 Major depressive disorder, single episode, unspecified: Secondary | ICD-10-CM | POA: Diagnosis not present

## 2015-04-18 DIAGNOSIS — M545 Low back pain, unspecified: Secondary | ICD-10-CM | POA: Insufficient documentation

## 2015-04-22 DIAGNOSIS — E1165 Type 2 diabetes mellitus with hyperglycemia: Secondary | ICD-10-CM | POA: Diagnosis present

## 2015-07-16 DIAGNOSIS — K921 Melena: Secondary | ICD-10-CM | POA: Diagnosis not present

## 2015-07-16 DIAGNOSIS — Z1231 Encounter for screening mammogram for malignant neoplasm of breast: Secondary | ICD-10-CM | POA: Diagnosis not present

## 2015-07-17 ENCOUNTER — Observation Stay (HOSPITAL_COMMUNITY)
Admission: EM | Admit: 2015-07-17 | Discharge: 2015-07-18 | Disposition: A | Discharge status: Home / Self Care | Payer: Medicare Other | Attending: Internal Medicine | Admitting: Internal Medicine

## 2015-07-17 ENCOUNTER — Emergency Department (HOSPITAL_COMMUNITY): Payer: Medicare Other

## 2015-07-17 ENCOUNTER — Encounter (HOSPITAL_COMMUNITY)
Admission: EM | Disposition: A | Discharge status: Home / Self Care | Payer: Self-pay | Source: Home / Self Care | Attending: Emergency Medicine

## 2015-07-17 ENCOUNTER — Encounter (HOSPITAL_COMMUNITY): Payer: Self-pay | Admitting: General Practice

## 2015-07-17 DIAGNOSIS — K269 Duodenal ulcer, unspecified as acute or chronic, without hemorrhage or perforation: Secondary | ICD-10-CM | POA: Insufficient documentation

## 2015-07-17 DIAGNOSIS — K219 Gastro-esophageal reflux disease without esophagitis: Secondary | ICD-10-CM | POA: Insufficient documentation

## 2015-07-17 DIAGNOSIS — K5730 Diverticulosis of large intestine without perforation or abscess without bleeding: Secondary | ICD-10-CM | POA: Diagnosis not present

## 2015-07-17 DIAGNOSIS — F329 Major depressive disorder, single episode, unspecified: Secondary | ICD-10-CM | POA: Insufficient documentation

## 2015-07-17 DIAGNOSIS — Z7982 Long term (current) use of aspirin: Secondary | ICD-10-CM | POA: Insufficient documentation

## 2015-07-17 DIAGNOSIS — D649 Anemia, unspecified: Secondary | ICD-10-CM | POA: Diagnosis not present

## 2015-07-17 DIAGNOSIS — I10 Essential (primary) hypertension: Secondary | ICD-10-CM | POA: Insufficient documentation

## 2015-07-17 DIAGNOSIS — G8929 Other chronic pain: Secondary | ICD-10-CM | POA: Insufficient documentation

## 2015-07-17 DIAGNOSIS — I2510 Atherosclerotic heart disease of native coronary artery without angina pectoris: Secondary | ICD-10-CM | POA: Diagnosis not present

## 2015-07-17 DIAGNOSIS — Z90710 Acquired absence of both cervix and uterus: Secondary | ICD-10-CM | POA: Diagnosis not present

## 2015-07-17 DIAGNOSIS — Z6834 Body mass index (BMI) 34.0-34.9, adult: Secondary | ICD-10-CM | POA: Diagnosis not present

## 2015-07-17 DIAGNOSIS — K2960 Other gastritis without bleeding: Secondary | ICD-10-CM | POA: Diagnosis not present

## 2015-07-17 DIAGNOSIS — I700 Atherosclerosis of aorta: Secondary | ICD-10-CM | POA: Diagnosis not present

## 2015-07-17 DIAGNOSIS — R911 Solitary pulmonary nodule: Secondary | ICD-10-CM | POA: Diagnosis not present

## 2015-07-17 DIAGNOSIS — I252 Old myocardial infarction: Secondary | ICD-10-CM | POA: Diagnosis not present

## 2015-07-17 DIAGNOSIS — R1030 Lower abdominal pain, unspecified: Secondary | ICD-10-CM | POA: Diagnosis not present

## 2015-07-17 DIAGNOSIS — K922 Gastrointestinal hemorrhage, unspecified: Secondary | ICD-10-CM

## 2015-07-17 DIAGNOSIS — K921 Melena: Principal | ICD-10-CM | POA: Diagnosis present

## 2015-07-17 DIAGNOSIS — R42 Dizziness and giddiness: Secondary | ICD-10-CM | POA: Diagnosis not present

## 2015-07-17 HISTORY — DX: Procedure and treatment not carried out because of patient's decision for reasons of belief and group pressure: Z53.1

## 2015-07-17 HISTORY — DX: Gastrointestinal hemorrhage, unspecified: K92.2

## 2015-07-17 HISTORY — PX: ESOPHAGOGASTRODUODENOSCOPY: SHX5428

## 2015-07-17 HISTORY — DX: Reserved for inherently not codable concepts without codable children: IMO0001

## 2015-07-17 LAB — POC OCCULT BLOOD, ED
FECAL OCCULT BLD: NEGATIVE
FECAL OCCULT BLD: POSITIVE — AB

## 2015-07-17 LAB — TROPONIN I

## 2015-07-17 LAB — CBC WITH DIFFERENTIAL/PLATELET
BASOS PCT: 0 %
Basophils Absolute: 0 10*3/uL (ref 0.0–0.1)
Eosinophils Absolute: 0.2 10*3/uL (ref 0.0–0.7)
Eosinophils Relative: 4 %
HCT: 32.6 % — ABNORMAL LOW (ref 36.0–46.0)
HEMOGLOBIN: 10.2 g/dL — AB (ref 12.0–15.0)
Lymphocytes Relative: 36 %
Lymphs Abs: 1.7 10*3/uL (ref 0.7–4.0)
MCH: 24.6 pg — AB (ref 26.0–34.0)
MCHC: 31.3 g/dL (ref 30.0–36.0)
MCV: 78.6 fL (ref 78.0–100.0)
Monocytes Absolute: 0.4 10*3/uL (ref 0.1–1.0)
Monocytes Relative: 7 %
NEUTROS PCT: 53 %
Neutro Abs: 2.5 10*3/uL (ref 1.7–7.7)
PLATELETS: 322 10*3/uL (ref 150–400)
RBC: 4.15 MIL/uL (ref 3.87–5.11)
RDW: 15.2 % (ref 11.5–15.5)
WBC: 4.8 10*3/uL (ref 4.0–10.5)

## 2015-07-17 LAB — CBC
HCT: 33.2 % — ABNORMAL LOW (ref 36.0–46.0)
HEMATOCRIT: 31.1 % — AB (ref 36.0–46.0)
HEMOGLOBIN: 9.6 g/dL — AB (ref 12.0–15.0)
Hemoglobin: 10.3 g/dL — ABNORMAL LOW (ref 12.0–15.0)
MCH: 24.1 pg — AB (ref 26.0–34.0)
MCH: 23.9 pg — AB (ref 26.0–34.0)
MCHC: 31 g/dL (ref 30.0–36.0)
MCHC: 30.9 g/dL (ref 30.0–36.0)
MCV: 77.6 fL — AB (ref 78.0–100.0)
MCV: 77.4 fL — ABNORMAL LOW (ref 78.0–100.0)
PLATELETS: 359 10*3/uL (ref 150–400)
Platelets: 326 10*3/uL (ref 150–400)
RBC: 4.28 MIL/uL (ref 3.87–5.11)
RBC: 4.02 MIL/uL (ref 3.87–5.11)
RDW: 15 % (ref 11.5–15.5)
RDW: 14.9 % (ref 11.5–15.5)
WBC: 5.5 10*3/uL (ref 4.0–10.5)
WBC: 6 10*3/uL (ref 4.0–10.5)

## 2015-07-17 LAB — URINALYSIS, ROUTINE W REFLEX MICROSCOPIC
BILIRUBIN URINE: NEGATIVE
Glucose, UA: NEGATIVE mg/dL
Hgb urine dipstick: NEGATIVE
Ketones, ur: NEGATIVE mg/dL
LEUKOCYTES UA: NEGATIVE
Nitrite: NEGATIVE
PROTEIN: NEGATIVE mg/dL
Specific Gravity, Urine: 1.009 (ref 1.005–1.030)
UROBILINOGEN UA: 0.2 mg/dL (ref 0.0–1.0)
pH: 7.5 (ref 5.0–8.0)

## 2015-07-17 LAB — COMPREHENSIVE METABOLIC PANEL
ALK PHOS: 62 U/L (ref 38–126)
ALT: 20 U/L (ref 14–54)
ANION GAP: 10 (ref 5–15)
AST: 25 U/L (ref 15–41)
Albumin: 3.6 g/dL (ref 3.5–5.0)
BUN: 6 mg/dL (ref 6–20)
CHLORIDE: 107 mmol/L (ref 101–111)
CO2: 25 mmol/L (ref 22–32)
Calcium: 9.4 mg/dL (ref 8.9–10.3)
Creatinine, Ser: 0.94 mg/dL (ref 0.44–1.00)
GFR calc non Af Amer: >60 mL/min (ref >60)
Glucose, Bld: 107 mg/dL — ABNORMAL HIGH (ref 65–99)
Potassium: 3.7 mmol/L (ref 3.5–5.1)
SODIUM: 142 mmol/L (ref 135–145)
Total Bilirubin: 0.3 mg/dL (ref 0.3–1.2)
Total Protein: 6.9 g/dL (ref 6.5–8.1)

## 2015-07-17 LAB — PROTIME-INR
INR: 1.02 (ref 0.00–1.49)
Prothrombin Time: 13.6 seconds (ref 11.6–15.2)

## 2015-07-17 LAB — TYPE AND SCREEN
ABO/RH(D): B POS
ANTIBODY SCREEN: NEGATIVE

## 2015-07-17 LAB — NO BLOOD PRODUCTS

## 2015-07-17 SURGERY — EGD (ESOPHAGOGASTRODUODENOSCOPY)
Anesthesia: Moderate Sedation

## 2015-07-17 MED ORDER — SODIUM CHLORIDE 0.9 % IV SOLN
INTRAVENOUS | Status: DC
  Administered 2015-07-17: 15:00:00 via INTRAVENOUS

## 2015-07-17 MED ORDER — PANTOPRAZOLE SODIUM 40 MG IV SOLR
40.0000 mg | Freq: Two times a day (BID) | INTRAVENOUS | Status: DC
  Administered 2015-07-17 – 2015-07-18 (×2): 40 mg via INTRAVENOUS
  Filled 2015-07-17 (×2): qty 40

## 2015-07-17 MED ORDER — ACETAMINOPHEN 325 MG PO TABS
650.0000 mg | ORAL_TABLET | Freq: Four times a day (QID) | ORAL | Status: DC | PRN
  Administered 2015-07-17: 650 mg via ORAL
  Filled 2015-07-17: qty 2

## 2015-07-17 MED ORDER — BUTAMBEN-TETRACAINE-BENZOCAINE 2-2-14 % EX AERO
INHALATION_SPRAY | CUTANEOUS | Status: DC | PRN
  Administered 2015-07-17: 2 via TOPICAL

## 2015-07-17 MED ORDER — ACETAMINOPHEN 650 MG RE SUPP: 650.0000 mg | Freq: Four times a day (QID) | RECTAL | Status: DC | PRN

## 2015-07-17 MED ORDER — SODIUM CHLORIDE 0.9 % IV SOLN
INTRAVENOUS | Status: DC
  Administered 2015-07-18 (×2): via INTRAVENOUS

## 2015-07-17 MED ORDER — SODIUM CHLORIDE 0.9 % IV SOLN
INTRAVENOUS | Status: AC
  Administered 2015-07-17: 13:00:00 via INTRAVENOUS

## 2015-07-17 MED ORDER — PANTOPRAZOLE SODIUM 40 MG IV SOLR
40.0000 mg | Freq: Once | INTRAVENOUS | Status: AC
  Administered 2015-07-17: 40 mg via INTRAVENOUS
  Filled 2015-07-17: qty 40

## 2015-07-17 MED ORDER — FENTANYL CITRATE (PF) 100 MCG/2ML IJ SOLN
INTRAMUSCULAR | Status: DC | PRN
  Administered 2015-07-17 (×3): 25 ug via INTRAVENOUS

## 2015-07-17 MED ORDER — FENTANYL CITRATE (PF) 100 MCG/2ML IJ SOLN
INTRAMUSCULAR | Status: AC
  Filled 2015-07-17: qty 2

## 2015-07-17 MED ORDER — DIPHENHYDRAMINE HCL 50 MG/ML IJ SOLN
INTRAMUSCULAR | Status: AC
  Filled 2015-07-17: qty 1

## 2015-07-17 MED ORDER — MIDAZOLAM HCL 10 MG/2ML IJ SOLN
INTRAMUSCULAR | Status: DC | PRN
  Administered 2015-07-17: 1 mg via INTRAVENOUS
  Administered 2015-07-17 (×2): 2 mg via INTRAVENOUS

## 2015-07-17 MED ORDER — IOHEXOL 300 MG/ML  SOLN
100.0000 mL | Freq: Once | INTRAMUSCULAR | Status: AC | PRN
  Administered 2015-07-17: 100 mL via INTRAVENOUS

## 2015-07-17 MED ORDER — MIDAZOLAM HCL 5 MG/ML IJ SOLN
INTRAMUSCULAR | Status: AC
  Filled 2015-07-17: qty 2

## 2015-07-17 NOTE — Op Note (Signed)
[facility] [place], [address]   ENDOSCOPY PROCEDURE REPORT  PATIENT: Tanya, Walsh  MR#: [id number] BIRTHDATE: 09-09-1956 , 59  yrs. old GENDER: female ENDOSCOPIST: Acquanetta Sit, MD REFERRED BY: PROCEDURE DATE:  Aug 15, 2015 PROCEDURE:  EGD with biopsy ASA CLASS:     2 INDICATIONS:  melena MEDICATIONS: fentanyl 75 g IV, Versed 5 mg IV TOPICAL ANESTHETIC: Cetacaine spray  DESCRIPTION OF PROCEDURE: After the risks benefits and alternatives of the procedure were thoroughly explained, informed consent was obtained.  The Pentax Gastroscope [id number] endoscope was introduced through the mouth and advanced to the second portion of the duodenum , Without limitations.  The instrument was slowly withdrawn as the mucosa was fully examined. Estimated blood loss is zero unless otherwise noted in this procedure report.  Findings  Esophagus: Normal  Stomach: Erosive antral gastritis with multiple erosions.C images 005 and 007 Body of the stomach normal. Fundus and cardia of the stomach normal.biopsy of antrum obtained to look for evidence of H. pylori  Duodenum: One small ulcer in the duodenum seen on image 004.    The scope was then withdrawn from the patient and the procedure completed.  COMPLICATIONS: There were no immediate complications.  ENDOSCOPIC IMPRESSION:erosive antral gastritis and a small duodenal ulceration   RECOMMENDATIONS:check biopsy for H. pylori. PPI therapy. Avoidance of NSAIDs. If hemoglobin stable she can go home tomorrow. Advance diet.   REPEAT EXAM:  eSignedAcquanetta Sit, MD 08/15/2015 4:16 PM    CC:  CPT CODES: ICD CODES:  The ICD and CPT codes recommended by this software are interpretations from the data that the clinical staff has captured with the software.  The verification of the translation of this report to the ICD and CPT codes and modifiers is the sole responsibility of the health care  institution and practicing physician where this report was generated.  [employer]. will not be held responsible for the validity of the ICD and CPT codes included on this report.  AMA assumes no liability for data contained or not contained herein. CPT is a Designer, television/film set of the [employer].  PATIENT NAME:  Tanya, Walsh MR#: [id number]

## 2015-07-17 NOTE — H&P (Signed)
Date: 07/17/2015               Patient Name:  Tanya Walsh MRN: [id number]  DOB: 1956/03/26 Age / Sex: 59 y.o., female   PCP: Nolene Ebbs, MD         Medical Service: Internal Medicine Teaching Service         Attending Physician: Dr. Aldine Contes, MD    First Contact: Dr. Marlowe Sax Pager: [phone]  Second Contact: Dr. Genene Churn Pager: [phone]       After Hours (After 5p/  First Contact Pager: [phone]  weekends / holidays): Second Contact Pager: [phone]   Chief Complaint: low Hgb and blood in stool  History of Present Illness: Patient is a 59 yo F with a PMHx of CAD, MI, HTN, GERD presenting to the hospital with a chief complaint of low Hgb and melanotic stools. Patient states her Hgb was 11 in August 2016 and was told it dropped to 9 when she went to her PCP yesterday (Dr. Ala Bent). States her doctor told her to go to the hospital. Reports having melanotic stools and worsening GERD symptoms for the past 1 week. States she has a history of GERD and was taking Protonix and Prevacid in the past. Denies taking any medications for GERD recently. Denies taking any iron supplementation. Reports having chronic bilateral lower quadrant abdominal pain. 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 Does not recall ever taking Bentyl for the IBS. Denies having any fevers, chills, nausea, vomiting, diarrhea, hematochezia, hematemesis, or unintentional weight loss. States she takes Aspirin daily and Ibuprofen 600 mg occasionally for chronic back pain (took 3 tablets in the past 1 week). States she had a colonoscopy 1-2 years ago showing polyps. Denies any family history of GI related cancers. Reports feeling lightheaded for the past  1 week when standing. Denies any vision changes or changes in hearing. Reports experiencing chest pain on admission - L sided, lasted a few minutes, worse with moving. Denies having any nausea or diaphoresis.  States she has been experiencing shortness of breath since her MI in 2000. Denies having any SOB at present. Denies having any cough, sputum production, or history of asthma. States she has never smoked cigarettes. Patient is a Sales promotion account executive witness and states she does not want blood transfusion. States she is willing to be transfused with iron instead.   Meds: Current Facility-Administered Medications  Medication Dose Route Frequency Provider Last Rate Last Dose  . 0.9 %  sodium chloride infusion   Intravenous Continuous Wonda Horner, MD 20 mL/hr at 07/17/15 1441    . 0.9 %  sodium chloride infusion   Intravenous STAT Ezequiel Essex, MD 100 mL/hr at 07/17/15 1231    . 0.9 %  sodium chloride infusion   Intravenous Continuous Tasrif Ahmed, MD      . Doug Sou Hold] acetaminophen (TYLENOL) tablet 650 mg  650 mg Oral Q6H PRN Dellia Nims, MD       Or  . Doug Sou Hold] acetaminophen (TYLENOL) suppository 650 mg  650 mg Rectal Q6H PRN Tasrif Ahmed, MD      . Doug Sou Hold] pantoprazole (PROTONIX) injection 40 mg  40 mg Intravenous Q12H Tasrif Ahmed, MD        Allergies: Allergies as of 07/17/2015  . (No Known Allergies)   Past Medical History  Diagnosis Date  . Coronary artery disease   . Myocardial infarction ([place])   .  Hypertension   . Depression    Past Surgical History  Procedure Laterality Date  . Abdominal hysterectomy    . Back surgery     Family History  Problem Relation Age of Onset  . Heart disease Father   . Heart disease Brother    Social History   Social History  . Marital Status: Widowed    Spouse Name: N/A  . Number of Children: N/A  . Years of Education: N/A   Occupational History  . Not on file.   Social History Main Topics  . Smoking status: Never Smoker   . Smokeless tobacco: Not on file  . Alcohol Use: Yes     Comment: rare  . Drug Use: No  . Sexual Activity: Not on file   Other Topics Concern  . Not on file   Social History Narrative    Review of  Systems: Review of Systems  Constitutional: Negative for fever, chills and weight loss.  HENT: Negative for ear pain.   Eyes: Negative for blurred vision and pain.  Respiratory: Positive for shortness of breath. Negative for cough, hemoptysis, sputum production and wheezing.   Cardiovascular: Positive for chest pain. Negative for palpitations and leg swelling.  Gastrointestinal: Positive for abdominal pain and melena. Negative for nausea, vomiting, diarrhea, constipation and blood in stool.  Genitourinary: Negative for dysuria, urgency and frequency.  Musculoskeletal: Negative for myalgias.  Skin: Negative for itching and rash.  Neurological: Positive for dizziness. Negative for sensory change, focal weakness and headaches.    Physical Exam: Blood pressure 183/107, pulse 84, temperature 98.9 F (37.2 C), temperature source Oral, resp. rate 21, height 5\' 3"  (1.6 m), weight 192 lb 7.4 oz (87.3 kg), SpO2 100 %. Physical Exam  Constitutional: She is oriented to person, place, and time. She appears well-developed and well-nourished. No distress.  HENT:  Head: Normocephalic and atraumatic.  Mouth/Throat: Oropharynx is clear and moist. No oropharyngeal exudate.  Eyes: EOM are normal. Pupils are equal, round, and reactive to light.  Neck: Neck supple. No tracheal deviation present.  Cardiovascular: Normal rate, regular rhythm and intact distal pulses.  Exam reveals no gallop and no friction rub.   No murmur heard. Pulmonary/Chest: Effort normal and breath sounds normal. No respiratory distress. She has no wheezes. She has no rales.  Abdominal: Soft. Bowel sounds are normal. She exhibits no distension. There is no tenderness. There is no rebound and no guarding.  Musculoskeletal: She exhibits no edema.  Neurological: She is alert and oriented to person, place, and time.  Skin: Skin is warm and dry.    Lab results: Basic Metabolic Panel:  Recent Labs  07/17/15 0735  NA 142  K 3.7  CL  107  CO2 25  GLUCOSE 107*  BUN 6  CREATININE 0.94  CALCIUM 9.4   Liver Function Tests:  Recent Labs  07/17/15 0735  AST 25  ALT 20  ALKPHOS 62  BILITOT 0.3  PROT 6.9  ALBUMIN 3.6   CBC:  Recent Labs  07/17/15 0735 07/17/15 1430  WBC 4.8 5.5  NEUTROABS 2.5  --   HGB 10.2* 10.3*  HCT 32.6* 33.2*  MCV 78.6 77.6*  PLT 322 359   Cardiac Enzymes:  Recent Labs  07/17/15 0735  TROPONINI <0.03   Coagulation:  Recent Labs  07/17/15 0735  LABPROT 13.6  INR 1.02   Urinalysis:  Recent Labs  07/17/15 0813  COLORURINE YELLOW  LABSPEC 1.009  PHURINE 7.5  GLUCOSEU NEGATIVE  HGBUR NEGATIVE  BILIRUBINUR NEGATIVE  KETONESUR NEGATIVE  PROTEINUR NEGATIVE  UROBILINOGEN 0.2  NITRITE NEGATIVE  LEUKOCYTESUR NEGATIVE    Imaging results:  Ct Abdomen Pelvis W Contrast  07/17/2015  CLINICAL DATA:  Dizziness.  Lower abdominal pain and black stool. EXAM: CT ABDOMEN AND PELVIS WITH CONTRAST TECHNIQUE: Multidetector CT imaging of the abdomen and pelvis was performed using the standard protocol following bolus administration of intravenous contrast. CONTRAST:  130mL OMNIPAQUE IOHEXOL 300 MG/ML  SOLN COMPARISON:  None FINDINGS: Lower chest: 3 mm nodule in right middle lobe is identified, image number 6/series 4. Hepatobiliary: No suspicious liver abnormality noted. The gallbladder is normal. No biliary dilatation. Pancreas: Unremarkable appearance of the pancreas. Spleen: The spleen is normal. Adrenals/Urinary Tract: The adrenal glands are normal. Stomach/Bowel: The stomach is within normal limits. The small bowel loops have a normal course and caliber. No obstruction. Normal appearance of the colon. Distal colonic diverticula noted. No acute inflammation. Vascular/Lymphatic: Calcified atherosclerotic disease involves the abdominal aorta. No aneurysm. No enlarged retroperitoneal or mesenteric adenopathy. No enlarged pelvic or inguinal lymph nodes. Reproductive: Previous hysterectomy.   No adnexal mass. Other: No free fluid or fluid collections. Musculoskeletal: The visualized bony structures are unremarkable. No aggressive lytic or sclerotic bone lesions. IMPRESSION: 1. No acute findings within the abdomen or pelvis. 2. Aortic atherosclerosis. 3. Right middle lobe nodule measures 3 mm. If the patient is at high risk for bronchogenic carcinoma, follow-up chest CT at 1 year is recommended. If the patient is at low risk, no follow-up is needed. This recommendation follows the consensus statement: Guidelines for Management of Small Pulmonary Nodules Detected on CT Scans: A Statement from the [employer] as published in Radiology 2005; 237:395-400. Electronically Signed   By: Kerby Moors M.D.   On: 07/17/2015 10:05    Other results: EKG: Similar to previous tracings. No new changes noted.   Assessment & Plan by Problem: Principal Problem:   Melena Active Problems:   Morbid obesity ([place])   Incidental lung nodule, less than or equal to 74mm  Melena Patient presenting with a 1 week history of worsening GERD, melanotic stools, and lower abdominal pain. Hgb 10.2 today, was 14.6 in August 2016. She not currently on any medications for her GERD. FOBT positive. CT of abdomen and pelvis did not show any acute findings. Orthostatic vitals positive upon admission. GI was consulted and did an EGD which showed erosive antral gastritis and a small duodenal ulceration. Vitals stable at present BP 146/89.  -Holding Aspirin -Stop NSAIDs -Protonix 40 mg IV BID -NS@ 100 cc/hr -F/u repeat CBC -F/u biopsy results (checking for H. Pylori) -Discharge to home tomorrow if Hgb stable as per GI.   Chest pain Patient reported having chest pain upon admission - L sided, lasted a few mins, and worse when moving. Echo from 11/2009 showing EF 45-50%. EKG similar to previous tracings, not showing any acute ST or T wave changes. Troponin negative. Stress test in 04/2015 was normal. Holter monitor in  04/2015 did not show any events or A-fib.  -Monitor for now    Pulmonary nodule  CT of abdomen and pelvis showing an incidental RML pulmonary nodule (3 mm). Patient denies any history of smoking.   HTN: Hold metoprolol for now   Diet: heart healthy   DVT ppx: SCDs  Dispo: Disposition is deferred at this time, awaiting improvement of current medical problems. Anticipated discharge in approximately 1-2 day(s).   The patient does have a current PCP Nolene Ebbs, MD) and does  need an [facility] hospital follow-up appointment after discharge.  The patient does not have transportation limitations that hinder transportation to clinic appointments.  Signed: Shela Leff, MD 07/17/2015, 3:55 PM

## 2015-07-17 NOTE — ED Notes (Signed)
Pt presents with complaints of dizziness, black stool, and lower abdominal pain that started last week. Pt rates pain a 3/10, and describes pain as cramping. Pt is A/O. Pt reports being nauseated but denies any nausea at this time.

## 2015-07-17 NOTE — Progress Notes (Signed)
Called ED for report.  RN unavailable at this time. She will return call.

## 2015-07-17 NOTE — Consult Note (Signed)
Subjective:   HPI  The patient is a 59 year old female who presented to the emergency room with complaints of melena for the past week. She saw her primary doctor yesterday who noted a low hemoglobin and hematocrit. She denies vomiting. She states she had a little indigestion recently. She takes aspirin daily and last week was taking some ibuprofen. There is no history of peptic ulcer disease. Denied bright red rectal bleeding.  Review of Systems Denies chest pain or shortness of breath  Past Medical History  Diagnosis Date  . Coronary artery disease   . Myocardial infarction ([place])   . Hypertension   . Depression    Past Surgical History  Procedure Laterality Date  . Abdominal hysterectomy    . Back surgery     Social History   Social History  . Marital Status: Widowed    Spouse Name: N/A  . Number of Children: N/A  . Years of Education: N/A   Occupational History  . Not on file.   Social History Main Topics  . Smoking status: Never Smoker   . Smokeless tobacco: Not on file  . Alcohol Use: Yes     Comment: rare  . Drug Use: No  . Sexual Activity: Not on file   Other Topics Concern  . Not on file   Social History Narrative   family history includes Heart disease in her brother and father. No current facility-administered medications for this encounter.  Current outpatient prescriptions:  .  aspirin 81 MG chewable tablet, Chew 81 mg by mouth daily., Disp: , Rfl:  .  ibuprofen (ADVIL,MOTRIN) 400 MG tablet, Take 400 mg by mouth every 6 (six) hours as needed for mild pain., Disp: , Rfl:  .  pravastatin (PRAVACHOL) 20 MG tablet, Take 20 mg by mouth daily., Disp: , Rfl:  .  diclofenac sodium (VOLTAREN) 1 % GEL, Apply 4 g topically 4 (four) times daily. To shoulder, please instruct in dosing. (Patient not taking: Reported on 07/17/2015), Disp: 1 Tube, Rfl: 2 .  HYDROcodone-acetaminophen (NORCO/VICODIN) 5-325 MG per tablet, Take 1 tablet by mouth every 6 (six) hours as  needed for moderate pain. (Patient not taking: Reported on 07/17/2015), Disp: 15 tablet, Rfl: 0 .  metoprolol tartrate (LOPRESSOR) 25 MG tablet, Take 1 tablet (25 mg total) by mouth 2 (two) times daily. (Patient not taking: Reported on 07/17/2015), Disp: 60 tablet, Rfl: 0 .  traMADol (ULTRAM) 50 MG tablet, Take 1 tablet (50 mg total) by mouth every 6 (six) hours as needed. (Patient not taking: Reported on 07/17/2015), Disp: 10 tablet, Rfl: 0 No Known Allergies   Objective:     BP 163/84 mmHg  Pulse 78  Temp(Src) 98.9 F (37.2 C) (Oral)  Resp 20  Ht 5\' 3"  (1.6 m)  Wt 89.359 kg (197 lb)  BMI 34.91 kg/m2  SpO2 100%  She is in no distress  Nonicteric  Heart regular rhythm no murmurs  Lungs clear  Abdomen: Bowel sounds normal, soft, nontender  Laboratory No components found for: D1    Assessment:     Melena  Anemia      Plan:     The patient is going to be admitted to the hospital. We will set her up for an EGD later today. Lab Results  Component Value Date   HGB 10.2* 07/17/2015   HGB 14.6 04/14/2014   HGB 13.9 05/05/2012   HCT 32.6* 07/17/2015   HCT 43.1 04/14/2014   HCT 42.0 05/05/2012   ALKPHOS 62  07/17/2015   ALKPHOS 59 11/15/2009   ALKPHOS 70 11/14/2009   AST 25 07/17/2015   AST 18 11/15/2009   AST 22 11/14/2009   ALT 20 07/17/2015   ALT 18 11/15/2009   ALT 20 11/14/2009

## 2015-07-17 NOTE — ED Provider Notes (Signed)
CSN: [id number]     Arrival date & time 07/17/15  3664 History   First MD Initiated Contact with Patient 07/17/15 [phone]     Chief Complaint  Patient presents with  . Melena     (Consider location/radiation/quality/duration/timing/severity/associated sxs/prior Treatment) HPI Comments: Patient states one week of black stools with lower abdominal pain and intermittent dizziness and lightheadedness. She saw her PCP yesterday and was told that her iron counts were low and her hemoglobin was low. She takes aspirin daily. She denies any other anticoagulation. She denies any vomiting. She is lower abdominal pain is intermittent. She also endorses some discomfort in her chest that comes and goes lasting for a few seconds at a time. She reports having a colonoscopy several years ago that showed polyps. No previous abdominal surgeries.  The history is provided by the patient.    Past Medical History  Diagnosis Date  . Coronary artery disease   . Myocardial infarction ([place])   . Hypertension   . Depression    Past Surgical History  Procedure Laterality Date  . Abdominal hysterectomy    . Back surgery     Family History  Problem Relation Age of Onset  . Heart disease Father   . Heart disease Brother    Social History  Substance Use Topics  . Smoking status: Never Smoker   . Smokeless tobacco: None  . Alcohol Use: Yes     Comment: rare   OB History    No data available     Review of Systems  Constitutional: Positive for fatigue. Negative for fever, activity change and appetite change.  Respiratory: Positive for chest tightness. Negative for cough and shortness of breath.   Cardiovascular: Negative for chest pain and leg swelling.  Gastrointestinal: Positive for nausea, abdominal pain and blood in stool. Negative for vomiting.  Genitourinary: Negative for dysuria, hematuria, vaginal bleeding and vaginal discharge.  Musculoskeletal: Negative for myalgias and arthralgias.  Skin:  Negative for wound.  Neurological: Positive for dizziness, weakness and light-headedness. Negative for headaches.  A complete 10 system review of systems was obtained and all systems are negative except as noted in the HPI and PMH.      Allergies  Review of patient's allergies indicates no known allergies.  Home Medications   Prior to Admission medications   Medication Sig Start Date End Date Taking? Authorizing Provider  aspirin 81 MG chewable tablet Chew 81 mg by mouth daily.   Yes Historical Provider, MD  ibuprofen (ADVIL,MOTRIN) 400 MG tablet Take 400 mg by mouth every 6 (six) hours as needed for mild pain.   Yes Historical Provider, MD  pravastatin (PRAVACHOL) 20 MG tablet Take 20 mg by mouth daily.   Yes Historical Provider, MD  diclofenac sodium (VOLTAREN) 1 % GEL Apply 4 g topically 4 (four) times daily. To shoulder, please instruct in dosing. Patient not taking: Reported on 07/17/2015 12/06/14   Billy Fischer, MD  HYDROcodone-acetaminophen (NORCO/VICODIN) 5-325 MG per tablet Take 1 tablet by mouth every 6 (six) hours as needed for moderate pain. Patient not taking: Reported on 07/17/2015 11/13/13   Audelia Hives Presson, PA  metoprolol tartrate (LOPRESSOR) 25 MG tablet Take 1 tablet (25 mg total) by mouth 2 (two) times daily. Patient not taking: Reported on 07/17/2015 04/14/14   Harvie Heck, PA-C  traMADol (ULTRAM) 50 MG tablet Take 1 tablet (50 mg total) by mouth every 6 (six) hours as needed. Patient not taking: Reported on 07/17/2015 04/14/14   Ander Purpura  Parker, PA-C   BP 146/89 mmHg  Pulse 92  Temp(Src) 98.9 F (37.2 C) (Oral)  Resp 20  Ht 5\' 3"  (1.6 m)  Wt 192 lb 7.4 oz (87.3 kg)  BMI 34.10 kg/m2  SpO2 93% Physical Exam  Constitutional: She is oriented to person, place, and time. She appears well-developed and well-nourished. No distress.  HENT:  Head: Normocephalic and atraumatic.  Mouth/Throat: Oropharynx is clear and moist. No oropharyngeal exudate.  Eyes: Conjunctivae  and EOM are normal. Pupils are equal, round, and reactive to light.  Pale conjunctiva  Neck: Normal range of motion. Neck supple.  No meningismus.  Cardiovascular: Normal rate, regular rhythm, normal heart sounds and intact distal pulses.   No murmur heard. Pulmonary/Chest: Effort normal and breath sounds normal. No respiratory distress.  Abdominal: Soft. There is no tenderness. There is no rebound and no guarding.  Genitourinary:  Chaperone present. No external hemorrhoids. Minimal stool obtained. No gross blood.  Musculoskeletal: Normal range of motion. She exhibits no edema or tenderness.  Neurological: She is alert and oriented to person, place, and time. No cranial nerve deficit. She exhibits normal muscle tone. Coordination normal.  No ataxia on finger to nose bilaterally. No pronator drift. 5/5 strength throughout. CN 2-12 intact. Negative Romberg. Equal grip strength. Sensation intact. Gait is normal.   Skin: Skin is warm.  Psychiatric: She has a normal mood and affect. Her behavior is normal.  Nursing note and vitals reviewed.   ED Course  Procedures (including critical care time) Labs Review Labs Reviewed  CBC WITH DIFFERENTIAL/PLATELET - Abnormal; Notable for the following:    Hemoglobin 10.2 (*)    HCT 32.6 (*)    MCH 24.6 (*)    All other components within normal limits  COMPREHENSIVE METABOLIC PANEL - Abnormal; Notable for the following:    Glucose, Bld 107 (*)    All other components within normal limits  CBC - Abnormal; Notable for the following:    Hemoglobin 10.3 (*)    HCT 33.2 (*)    MCV 77.6 (*)    MCH 24.1 (*)    All other components within normal limits  POC OCCULT BLOOD, ED - Abnormal; Notable for the following:    Fecal Occult Bld POSITIVE (*)    All other components within normal limits  PROTIME-INR  URINALYSIS, ROUTINE W REFLEX MICROSCOPIC (NOT AT [facility])  TROPONIN I  CBC  CBC  COMPREHENSIVE METABOLIC PANEL  POC OCCULT BLOOD, ED  POC OCCULT  BLOOD, ED  TYPE AND SCREEN  NO BLOOD PRODUCTS  SURGICAL PATHOLOGY    Imaging Review Ct Abdomen Pelvis W Contrast  07/17/2015  CLINICAL DATA:  Dizziness.  Lower abdominal pain and black stool. EXAM: CT ABDOMEN AND PELVIS WITH CONTRAST TECHNIQUE: Multidetector CT imaging of the abdomen and pelvis was performed using the standard protocol following bolus administration of intravenous contrast. CONTRAST:  132mL OMNIPAQUE IOHEXOL 300 MG/ML  SOLN COMPARISON:  None FINDINGS: Lower chest: 3 mm nodule in right middle lobe is identified, image number 6/series 4. Hepatobiliary: No suspicious liver abnormality noted. The gallbladder is normal. No biliary dilatation. Pancreas: Unremarkable appearance of the pancreas. Spleen: The spleen is normal. Adrenals/Urinary Tract: The adrenal glands are normal. Stomach/Bowel: The stomach is within normal limits. The small bowel loops have a normal course and caliber. No obstruction. Normal appearance of the colon. Distal colonic diverticula noted. No acute inflammation. Vascular/Lymphatic: Calcified atherosclerotic disease involves the abdominal aorta. No aneurysm. No enlarged retroperitoneal or mesenteric adenopathy. No enlarged  pelvic or inguinal lymph nodes. Reproductive: Previous hysterectomy.  No adnexal mass. Other: No free fluid or fluid collections. Musculoskeletal: The visualized bony structures are unremarkable. No aggressive lytic or sclerotic bone lesions. IMPRESSION: 1. No acute findings within the abdomen or pelvis. 2. Aortic atherosclerosis. 3. Right middle lobe nodule measures 3 mm. If the patient is at high risk for bronchogenic carcinoma, follow-up chest CT at 1 year is recommended. If the patient is at low risk, no follow-up is needed. This recommendation follows the consensus statement: Guidelines for Management of Small Pulmonary Nodules Detected on CT Scans: A Statement from the [place] as published in Radiology 2005; 237:395-400. Electronically  Signed   By: Kerby Moors M.D.   On: 07/17/2015 10:05   I have personally reviewed and evaluated these images and lab results as part of my medical decision-making.   EKG Interpretation   Date/Time:  Thursday July 17 2015 08:00:29 EDT Ventricular Rate:  78 PR Interval:  153 QRS Duration: 87 QT Interval:  386 QTC Calculation: 440 R Axis:   9 Text Interpretation:  Sinus rhythm Abnormal R-wave progression, early  transition Nonspecific T wave abnormality Confirmed by [place] [phone]) on 07/17/2015 8:19:18 AM      MDM   Final diagnoses:  Melena   Melena for the past week with intermittent weakness and lightheadedness and lower abdominal pain.  Outside records shows hemoglobin has decreased from 11 to 9 in the past 3 months.  Hemoglobin today is 10.2 which is decreased from her baseline and 13 and 14 range. Orthostatics are positive heart rate elevates to 100 with standing.  Discussed with Dr. Penelope Coop of GI who will consult.  Initial Hemoccult was negative. But this was a poor sample. Repeat is positive for blood.  Patient is Jehovah's Witness and does not want any transfusion. PPI given.  CT unrevealing.  Plan admission for EGD.  D/w [facility] residents.  Ezequiel Essex, MD 07/17/15 1719

## 2015-07-18 ENCOUNTER — Encounter (HOSPITAL_COMMUNITY): Payer: Self-pay | Admitting: Gastroenterology

## 2015-07-18 DIAGNOSIS — K921 Melena: Secondary | ICD-10-CM | POA: Diagnosis not present

## 2015-07-18 LAB — COMPREHENSIVE METABOLIC PANEL
ALK PHOS: 58 U/L (ref 38–126)
ALT: 18 U/L (ref 14–54)
AST: 20 U/L (ref 15–41)
Albumin: 3 g/dL — ABNORMAL LOW (ref 3.5–5.0)
Anion gap: 7 (ref 5–15)
BUN: 6 mg/dL (ref 6–20)
CALCIUM: 8.9 mg/dL (ref 8.9–10.3)
CO2: 26 mmol/L (ref 22–32)
CREATININE: 0.97 mg/dL (ref 0.44–1.00)
Chloride: 107 mmol/L (ref 101–111)
GFR calc Af Amer: >60 mL/min (ref >60)
GFR calc non Af Amer: >60 mL/min (ref >60)
Glucose, Bld: 106 mg/dL — ABNORMAL HIGH (ref 65–99)
Potassium: 3.9 mmol/L (ref 3.5–5.1)
SODIUM: 140 mmol/L (ref 135–145)
Total Bilirubin: 0.4 mg/dL (ref 0.3–1.2)
Total Protein: 6.1 g/dL — ABNORMAL LOW (ref 6.5–8.1)

## 2015-07-18 LAB — CBC
HEMATOCRIT: 28.4 % — AB (ref 36.0–46.0)
HEMOGLOBIN: 9.3 g/dL — AB (ref 12.0–15.0)
MCH: 25.6 pg — AB (ref 26.0–34.0)
MCHC: 32.7 g/dL (ref 30.0–36.0)
MCV: 78.2 fL (ref 78.0–100.0)
Platelets: 282 10*3/uL (ref 150–400)
RBC: 3.63 MIL/uL — AB (ref 3.87–5.11)
RDW: 15.3 % (ref 11.5–15.5)
WBC: 4.2 10*3/uL (ref 4.0–10.5)

## 2015-07-18 MED ORDER — TRAMADOL HCL 50 MG PO TABS: 50.0000 mg | ORAL_TABLET | Freq: Four times a day (QID) | ORAL | Status: DC | PRN

## 2015-07-18 MED ORDER — PANTOPRAZOLE SODIUM 40 MG PO TBEC: 40.0000 mg | DELAYED_RELEASE_TABLET | Freq: Two times a day (BID) | ORAL | Status: DC

## 2015-07-18 MED ORDER — TRAMADOL HCL 50 MG PO TABS
50.0000 mg | ORAL_TABLET | Freq: Once | ORAL | Status: AC
  Administered 2015-07-18: 50 mg via ORAL
  Filled 2015-07-18: qty 1

## 2015-07-18 NOTE — Progress Notes (Signed)
Patient ambulated with RN by her side.  She walked all the way to the end of the hall and back to her room.  No dizziness but having some feeling of heaviness in her leg with pain on her Rt heel on ambulation.

## 2015-07-18 NOTE — Progress Notes (Signed)
Pt Dc'd via [place] to lobby. Charge RN too pt to lobby, pt's friend with her for drive home. IV dc'd, telebox returned to office room. Prescription and DC instructions given. Pt left at 1643

## 2015-07-18 NOTE — Discharge Summary (Signed)
Name: Tanya Walsh MRN: [id number] DOB: July 08, 1956 59 y.o. PCP: Nolene Ebbs, MD  Date of Admission: 07/17/2015  6:58 AM Date of Discharge: 07/20/2015 Attending Physician: No att. providers found  Discharge Diagnosis:  Principal Problem:   Melena Active Problems:   Morbid obesity ([facility])   Incidental lung nodule, less than or equal to 69mm  Discharge Medications:   Medication List    STOP taking these medications        aspirin 81 MG chewable tablet     diclofenac sodium 1 % Gel  Commonly known as:  VOLTAREN     ibuprofen 400 MG tablet  Commonly known as:  ADVIL,MOTRIN      TAKE these medications        HYDROcodone-acetaminophen 5-325 MG tablet  Commonly known as:  NORCO/VICODIN  Take 1 tablet by mouth every 6 (six) hours as needed for moderate pain.     metoprolol tartrate 25 MG tablet  Commonly known as:  LOPRESSOR  Take 1 tablet (25 mg total) by mouth 2 (two) times daily.     pantoprazole 40 MG tablet  Commonly known as:  PROTONIX  Take 1 tablet (40 mg total) by mouth 2 (two) times daily.     pravastatin 20 MG tablet  Commonly known as:  PRAVACHOL  Take 20 mg by mouth daily.     traMADol 50 MG tablet  Commonly known as:  ULTRAM  Take 1 tablet (50 mg total) by mouth every 6 (six) hours as needed (for pain).        Disposition and follow-up:   Tanya Walsh [facility] in Good condition.  At the hospital follow up visit please address:  -Holding ASA in the setting of erosive gastritis and small duodenal ulcer.   -Incidental RML pulmonary nodule (3 mm)  Labs / imaging needed at time of follow-up: CBC  Labs / imaging needing follow-up: Biopsy results (checking for H. Pylori)  Follow-up Appointments:     Follow-up Information    Follow up with Tanya Clement, MD. Go on 07/28/2015.   Specialty:  Gastroenterology   Why:  Follow up appointment at 10:45 am. Arrive 15 minutes early.    Contact information:    [address] [place] [address] [phone]       Follow up with Tanya Obey, MD. Go on 07/28/2015.   Specialty:  Family Medicine   Why:  Follow up appointment at 1:30 pm.    Contact information:   [place] [address] [phone]        Consultations: Treatment Team:  Clarene Essex, MD  Procedures Performed:  Ct Abdomen Pelvis W Contrast  07/17/2015  CLINICAL DATA:  Dizziness.  Lower abdominal pain and black stool. EXAM: CT ABDOMEN AND PELVIS WITH CONTRAST TECHNIQUE: Multidetector CT imaging of the abdomen and pelvis was performed using the standard protocol following bolus administration of intravenous contrast. CONTRAST:  174mL OMNIPAQUE IOHEXOL 300 MG/ML  SOLN COMPARISON:  None FINDINGS: Lower chest: 3 mm nodule in right middle lobe is identified, image number 6/series 4. Hepatobiliary: No suspicious liver abnormality noted. The gallbladder is normal. No biliary dilatation. Pancreas: Unremarkable appearance of the pancreas. Spleen: The spleen is normal. Adrenals/Urinary Tract: The adrenal glands are normal. Stomach/Bowel: The stomach is within normal limits. The small bowel loops have a normal course and caliber. No obstruction. Normal appearance of the colon. Distal colonic diverticula noted. No acute  inflammation. Vascular/Lymphatic: Calcified atherosclerotic disease involves the abdominal aorta. No aneurysm. No enlarged retroperitoneal or mesenteric adenopathy. No enlarged pelvic or inguinal lymph nodes. Reproductive: Previous hysterectomy.  No adnexal mass. Other: No free fluid or fluid collections. Musculoskeletal: The visualized bony structures are unremarkable. No aggressive lytic or sclerotic bone lesions. IMPRESSION: 1. No acute findings within the abdomen or pelvis. 2. Aortic atherosclerosis. 3. Right middle lobe nodule measures 3 mm. If the patient is at high risk for bronchogenic carcinoma, follow-up chest CT at 1 year is  recommended. If the patient is at low risk, no follow-up is needed. This recommendation follows the consensus statement: Guidelines for Management of Small Pulmonary Nodules Detected on CT Scans: A Statement from the [place] as published in Radiology 2005; 237:395-400. Electronically Signed   By: Kerby Moors M.D.   On: 07/17/2015 10:05    Admission HPI:  Patient is a 59 yo F with a PMHx of CAD, MI, HTN, GERD presenting to the hospital with a chief complaint of low Hgb and melanotic stools. Patient states her Hgb was 11 in August 2016 and was told it dropped to 9 when she went to her PCP yesterday (Dr. Ala Bent). States her doctor told her to go to the hospital. Reports having melanotic stools and worsening GERD symptoms for the past 1 week. States she has a history of GERD and was taking Protonix and Prevacid in the past. Denies taking any medications for GERD recently. Denies taking any iron supplementation. Reports having chronic bilateral lower quadrant abdominal pain. 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 Does not recall ever taking Bentyl for the IBS. Denies having any fevers, chills, nausea, vomiting, diarrhea, hematochezia, hematemesis, or unintentional weight loss. States she takes Aspirin daily and Ibuprofen 600 mg occasionally for chronic back pain (took 3 tablets in the past 1 week). States she had a colonoscopy 1-2 years ago showing polyps. Denies any family history of GI related cancers. Reports feeling lightheaded for the past 1 week when standing. Denies any vision changes or changes in hearing. Reports experiencing chest pain on admission - L sided, lasted a few minutes, worse with moving. Denies having any nausea or diaphoresis. States she has been experiencing shortness of breath since her MI in 2000. Denies having any SOB at present. Denies having any cough, sputum production, or history of asthma. States she has  never smoked cigarettes. Patient is a Sales promotion account executive witness and states she does not want blood transfusion. States she is willing to be transfused with iron instead.   Hospital Course by problem list: Principal Problem:   Melena Active Problems:   Morbid obesity ([place])   Incidental lung nodule, less than or equal to 11mm  Low Hgb in the setting of gastritis and duodenal ulcer  Patient presenting with a 1 week history of worsening GERD, melena, and lower abdominal pain. Hgb 10.2 on admisssion, was 14.6 in August 2016. Orthostatic vitals positive upon admisssion. Patient has been taking Aspirin daily and using NSAIDs intermittently at home. EGD showing erosive antral gastritis and a small duodenal ulceration. Aspirin was held and NSAIDs stopped. Patient started on Protonix 40 mg BID. On the day of discharge, she was tolerating po intake well and denied any further episodes of melena. She was not dizzy and Hgb remained stable. F/u with PCP and GI in appoximately 10 days.  Chest pain Patient reported having chest pain upon admission - L  sided, lasted a few mins, and worse when moving. Echo from 11/2009 showing EF 45-50%. Stress test in 04/2015 was normal. Holter monitor in 04/2015 did not show any events or A-fib. EKG similar to previous tracings, not showing any acute ST or T wave changes. Troponin negative.  No further episodes of chest pain reported by patient during the hospitalization.    Pulmonary nodule  CT of abdomen and pelvis showing an incidental RML pulmonary nodule (3 mm). Patient denied any history of smoking. Since patient is low-risk, no further workup needed at this time.     Discharge Vitals:   BP 125/80 mmHg  Pulse 67  Temp(Src) 98.5 F (36.9 C) (Oral)  Resp 20  Ht 5\' 3"  (1.6 m)  Wt 196 lb 10.4 oz (89.2 kg)  BMI 34.84 kg/m2  SpO2 98%  Discharge Labs:  No results found for this or any previous visit (from the past 24 hour(s)).  Signed: Shela Leff, MD 07/20/2015, 11:20  PM    Services Ordered on Discharge: None Equipment Ordered on Discharge: None

## 2015-07-18 NOTE — Progress Notes (Signed)
Patient seen and examined. Case d/w residents in detail on morning rounds.  HPI: 59 y/o female with PMH of CAD s/p MI, HTN, GERD who p/w low Hg from outpatient PCP office. Patient states that she has been having black stools over the last week and her last BM was the day PTA. She denies BRBPR. No hematemesis. She does complain of worsening GERD symptoms over the past week and admits to intermittent ibuprofen use in addition to her aspirin. She complained of lightheadedness over the past week and followed up with her PCP and had a cbc drawn which showed a Hg of 9 ( baseline approx 11) and was advised to come to ED. She denies fevers/chills, no n/v, no palpitations, no diaphoresis, no syncope.  Physical Exam: Gen: AAO*3, NAD CVS: RRR, normal heart sounds Lungs: CTA b/l Abd: soft, non tender, BS + Ext: no edema  Assessment and Plan:  Acute blood loss anemia: - secondary to GI bleed  - Hg now stable - No further episodes of melena - s/p EGD with erosive gastritis and small duodenal ulcer - will c/w PPI bid for now - hold asa. NO NSAIDS - Outpatient f/u with PCP next week - Patient stable for d/c home today. No further w/u for now

## 2015-07-18 NOTE — Progress Notes (Signed)
Subjective: Patient was seen and examined at bedside today. Denies having any more episodes of melena. Denies having hematochezia or hematemesis. Denies having any CP, SOB, nausea, vomiting, or GERD symptoms. Nursing staff ambulated the patient and reported she did not have dizziness, however, complained of some feeling of leg heaviness and heal pain.  I saw and evaluated the patient. Patient stated she has a history of chronic lower back pain and described the "leg heaviness" as occasional numbness which radiates to the L lateral thigh only when she has been lying down in the bed for a long time. Denies having the sensation anymore. Denies having any weakness in her legs. Denied any alarm symptoms such as urinary or fecal incontinence. Also reports having a history of plantar fascitis of her R foot. States her foot hurts when she steps out of the bed.   Objective: Vital signs in last 24 hours: Filed Vitals:   07/17/15 2108 07/18/15 0529 07/18/15 0900 07/18/15 1434  BP: 108/73 122/71 136/80 125/80  Pulse: 82 75 70 67  Temp: 98.3 F (36.8 C) 98 F (36.7 C) 98.2 F (36.8 C) 98.5 F (36.9 C)  TempSrc: Oral Oral Oral Oral  Resp: 18 16 20 20   Height:      Weight: 196 lb 10.4 oz (89.2 kg)     SpO2: 95% 93%  98%   Weight change:   Intake/Output Summary (Last 24 hours) at 07/18/15 1758 Last data filed at 07/18/15 1300  Gross per 24 hour  Intake   1200 ml  Output      0 ml  Net   1200 ml   Physical Exam  Constitutional: She is oriented to person, place, and time. She appears well-developed and well-nourished. No distress.  Cardiovascular: Normal rate, regular rhythm and intact distal pulses. Exam reveals no gallop and no friction rub.  No murmur heard. Pulmonary/Chest: Effort normal and breath sounds normal. No respiratory distress. She has no wheezes. She has no rales.  Abdominal: Soft. Bowel sounds are normal. She exhibits no distension. There is no tenderness. There is no rebound  and no guarding.  Musculoskeletal: She exhibits no edema.  Neurological: She is alert and oriented to person, place, and time. CN II-XII grossly intact. Strength and sensation grossly intact in bilateral upper and lower extremities.  Skin: Skin is warm and dry.   Lab Results: Basic Metabolic Panel:  Recent Labs Lab 07/17/15 0735 07/18/15 0523  NA 142 140  K 3.7 3.9  CL 107 107  CO2 25 26  GLUCOSE 107* 106*  BUN 6 6  CREATININE 0.94 0.97  CALCIUM 9.4 8.9   Liver Function Tests:  Recent Labs Lab 07/17/15 0735 07/18/15 0523  AST 25 20  ALT 20 18  ALKPHOS 62 58  BILITOT 0.3 0.4  PROT 6.9 6.1*  ALBUMIN 3.6 3.0*   CBC:  Recent Labs Lab 07/17/15 0735  07/17/15 1951 07/18/15 0523  WBC 4.8  < > 6.0 4.2  NEUTROABS 2.5  --   --   --   HGB 10.2*  < > 9.6* 9.3*  HCT 32.6*  < > 31.1* 28.4*  MCV 78.6  < > 77.4* 78.2  PLT 322  < > 326 282  < > = values in this interval not displayed. Cardiac Enzymes:  Recent Labs Lab 07/17/15 0735  TROPONINI <0.03   Coagulation:  Recent Labs Lab 07/17/15 0735  LABPROT 13.6  INR 1.02   Urinalysis:  Recent Labs Lab 07/17/15 0813  COLORURINE  YELLOW  LABSPEC 1.009  PHURINE 7.5  GLUCOSEU NEGATIVE  HGBUR NEGATIVE  BILIRUBINUR NEGATIVE  KETONESUR NEGATIVE  PROTEINUR NEGATIVE  UROBILINOGEN 0.2  NITRITE NEGATIVE  LEUKOCYTESUR NEGATIVE    Micro Results: No results found for this or any previous visit (from the past 240 hour(s)). Studies/Results: Ct Abdomen Pelvis W Contrast  07/17/2015  CLINICAL DATA:  Dizziness.  Lower abdominal pain and black stool. EXAM: CT ABDOMEN AND PELVIS WITH CONTRAST TECHNIQUE: Multidetector CT imaging of the abdomen and pelvis was performed using the standard protocol following bolus administration of intravenous contrast. CONTRAST:  161mL OMNIPAQUE IOHEXOL 300 MG/ML  SOLN COMPARISON:  None FINDINGS: Lower chest: 3 mm nodule in right middle lobe is identified, image number 6/series 4.  Hepatobiliary: No suspicious liver abnormality noted. The gallbladder is normal. No biliary dilatation. Pancreas: Unremarkable appearance of the pancreas. Spleen: The spleen is normal. Adrenals/Urinary Tract: The adrenal glands are normal. Stomach/Bowel: The stomach is within normal limits. The small bowel loops have a normal course and caliber. No obstruction. Normal appearance of the colon. Distal colonic diverticula noted. No acute inflammation. Vascular/Lymphatic: Calcified atherosclerotic disease involves the abdominal aorta. No aneurysm. No enlarged retroperitoneal or mesenteric adenopathy. No enlarged pelvic or inguinal lymph nodes. Reproductive: Previous hysterectomy.  No adnexal mass. Other: No free fluid or fluid collections. Musculoskeletal: The visualized bony structures are unremarkable. No aggressive lytic or sclerotic bone lesions. IMPRESSION: 1. No acute findings within the abdomen or pelvis. 2. Aortic atherosclerosis. 3. Right middle lobe nodule measures 3 mm. If the patient is at high risk for bronchogenic carcinoma, follow-up chest CT at 1 year is recommended. If the patient is at low risk, no follow-up is needed. This recommendation follows the consensus statement: Guidelines for Management of Small Pulmonary Nodules Detected on CT Scans: A Statement from the [employer] as published in Radiology 2005; 237:395-400. Electronically Signed   By: Kerby Moors M.D.   On: 07/17/2015 10:05   Medications: I have reviewed the patient's current medications. Scheduled Meds: . pantoprazole (PROTONIX) IV  40 mg Intravenous Q12H   Continuous Infusions: . sodium chloride 100 mL/hr at 07/18/15 1252   PRN Meds:.acetaminophen **OR** acetaminophen Assessment/Plan: Principal Problem:   Melena Active Problems:   Morbid obesity ([place])   Incidental lung nodule, less than or equal to 55mm  Melena Patient presenting with a 1 week history of worsening GERD, melanotic stools, and lower abdominal  pain. Hgb 10.2 on admission, was 14.6 in August 2016. She was not taking any medications for her GERD. FOBT was positive. CT of abdomen and pelvis did not show any acute findings. Orthostatic vitals positive upon admission. GI was consulted and did an EGD which showed erosive antral gastritis and a small duodenal ulceration. Vitals stable at present. BP 125/80. Patient is tolerating po intake well and denies any further episodes of melena. She is not dizzy and Hgb stable. She will discharged to home today.   -Stop Aspirin for now. Patient has a f/u appt with PCP.  -Stop NSAIDs -Protonix 40 mg IV BID -F/u biopsy results (checking for H. Pylori)  Chest pain Resolved. Patient reported having chest pain upon admission - L sided, lasted a few mins, and worse when moving. Echo from 11/2009 showing EF 45-50%. EKG similar to previous tracings, not showing any acute ST or T wave changes. Troponin negative. Stress test in 04/2015 was normal. Holter monitor in 04/2015 did not show any events or A-fib. Patient denied having any CP or SOB today.  Pulmonary nodule  CT of abdomen and pelvis showing an incidental RML pulmonary nodule (3 mm). Patient denies any history of smoking.  -Will make PCP aware of this finding.   HTN: Hold metoprolol for now   Diet: heart healthy   DVT ppx: SCDs  Dispo: Disposition is deferred at this time, awaiting improvement of current medical problems.  Anticipated discharge in approximately 0 day(s).   The patient does have a current PCP Nolene Ebbs, MD) and does need an [facility] hospital follow-up appointment after discharge.  The patient does not have transportation limitations that hinder transportation to clinic appointments.  .Services Needed at time of discharge: Y = Yes, Blank = No PT:   OT:   RN:   Equipment:   Other:       Shela Leff, MD 07/18/2015, 5:58 PM

## 2015-07-19 ENCOUNTER — Emergency Department (HOSPITAL_COMMUNITY)
Admission: EM | Admit: 2015-07-19 | Discharge: 2015-07-19 | Disposition: A | Discharge status: Home / Self Care | Payer: Medicare Other | Attending: Emergency Medicine | Admitting: Emergency Medicine

## 2015-07-19 ENCOUNTER — Encounter (HOSPITAL_COMMUNITY): Payer: Self-pay | Admitting: *Deleted

## 2015-07-19 DIAGNOSIS — K029 Dental caries, unspecified: Secondary | ICD-10-CM | POA: Insufficient documentation

## 2015-07-19 DIAGNOSIS — Y9389 Activity, other specified: Secondary | ICD-10-CM | POA: Diagnosis not present

## 2015-07-19 DIAGNOSIS — R51 Headache: Secondary | ICD-10-CM | POA: Insufficient documentation

## 2015-07-19 DIAGNOSIS — X58XXXA Exposure to other specified factors, initial encounter: Secondary | ICD-10-CM | POA: Diagnosis not present

## 2015-07-19 DIAGNOSIS — R220 Localized swelling, mass and lump, head: Secondary | ICD-10-CM | POA: Diagnosis present

## 2015-07-19 DIAGNOSIS — Z8719 Personal history of other diseases of the digestive system: Secondary | ICD-10-CM | POA: Diagnosis not present

## 2015-07-19 DIAGNOSIS — I10 Essential (primary) hypertension: Secondary | ICD-10-CM | POA: Insufficient documentation

## 2015-07-19 DIAGNOSIS — I2510 Atherosclerotic heart disease of native coronary artery without angina pectoris: Secondary | ICD-10-CM | POA: Insufficient documentation

## 2015-07-19 DIAGNOSIS — Y9289 Other specified places as the place of occurrence of the external cause: Secondary | ICD-10-CM | POA: Insufficient documentation

## 2015-07-19 DIAGNOSIS — Z79899 Other long term (current) drug therapy: Secondary | ICD-10-CM | POA: Diagnosis not present

## 2015-07-19 DIAGNOSIS — Y998 Other external cause status: Secondary | ICD-10-CM | POA: Diagnosis not present

## 2015-07-19 DIAGNOSIS — F329 Major depressive disorder, single episode, unspecified: Secondary | ICD-10-CM | POA: Diagnosis not present

## 2015-07-19 DIAGNOSIS — D649 Anemia, unspecified: Secondary | ICD-10-CM | POA: Insufficient documentation

## 2015-07-19 DIAGNOSIS — T7840XA Allergy, unspecified, initial encounter: Secondary | ICD-10-CM | POA: Diagnosis not present

## 2015-07-19 DIAGNOSIS — I252 Old myocardial infarction: Secondary | ICD-10-CM | POA: Insufficient documentation

## 2015-07-19 LAB — CBC WITH DIFFERENTIAL/PLATELET
BASOS PCT: 0 %
Basophils Absolute: 0 10*3/uL (ref 0.0–0.1)
EOS ABS: 0.2 10*3/uL (ref 0.0–0.7)
EOS PCT: 4 %
HCT: 30 % — ABNORMAL LOW (ref 36.0–46.0)
HEMOGLOBIN: 9.3 g/dL — AB (ref 12.0–15.0)
LYMPHS PCT: 32 %
Lymphs Abs: 1.7 10*3/uL (ref 0.7–4.0)
MCH: 24.2 pg — AB (ref 26.0–34.0)
MCHC: 31 g/dL (ref 30.0–36.0)
MCV: 77.9 fL — AB (ref 78.0–100.0)
Monocytes Absolute: 0.5 10*3/uL (ref 0.1–1.0)
Monocytes Relative: 9 %
NEUTROS ABS: 3 10*3/uL (ref 1.7–7.7)
Neutrophils Relative %: 55 %
Platelets: 308 10*3/uL (ref 150–400)
RBC: 3.85 MIL/uL — ABNORMAL LOW (ref 3.87–5.11)
RDW: 15 % (ref 11.5–15.5)
WBC: 5.4 10*3/uL (ref 4.0–10.5)

## 2015-07-19 LAB — BASIC METABOLIC PANEL
ANION GAP: 6 (ref 5–15)
BUN: 8 mg/dL (ref 6–20)
CALCIUM: 8.9 mg/dL (ref 8.9–10.3)
CO2: 27 mmol/L (ref 22–32)
CREATININE: 0.9 mg/dL (ref 0.44–1.00)
Chloride: 107 mmol/L (ref 101–111)
GFR calc non Af Amer: >60 mL/min (ref >60)
GLUCOSE: 105 mg/dL — AB (ref 65–99)
Potassium: 3.8 mmol/L (ref 3.5–5.1)
SODIUM: 140 mmol/L (ref 135–145)

## 2015-07-19 MED ORDER — FAMOTIDINE 20 MG PO TABS: 20.0000 mg | ORAL_TABLET | Freq: Two times a day (BID) | ORAL | Status: DC

## 2015-07-19 MED ORDER — METHYLPREDNISOLONE SODIUM SUCC 125 MG IJ SOLR
125.0000 mg | Freq: Once | INTRAMUSCULAR | Status: AC
  Administered 2015-07-19: 125 mg via INTRAVENOUS
  Filled 2015-07-19: qty 2

## 2015-07-19 MED ORDER — FAMOTIDINE IN NACL 20-0.9 MG/50ML-% IV SOLN
20.0000 mg | Freq: Once | INTRAVENOUS | Status: AC
  Administered 2015-07-19: 20 mg via INTRAVENOUS
  Filled 2015-07-19: qty 50

## 2015-07-19 MED ORDER — DIPHENHYDRAMINE HCL 50 MG/ML IJ SOLN
25.0000 mg | Freq: Once | INTRAMUSCULAR | Status: AC
  Administered 2015-07-19: 25 mg via INTRAVENOUS
  Filled 2015-07-19: qty 1

## 2015-07-19 MED ORDER — DIPHENHYDRAMINE HCL 25 MG PO TABS: 25.0000 mg | ORAL_TABLET | Freq: Four times a day (QID) | ORAL | Status: DC

## 2015-07-19 NOTE — ED Notes (Signed)
Pt. Was discharged from the hospital yesterday for a GI bleed. Pt. Woke up this morning with a headache and felt like her throat was closing up. Pt has no known allergies.

## 2015-07-19 NOTE — Discharge Instructions (Signed)
Read the information below.  Use the prescribed medication as directed.  Please discuss all new medications with your pharmacist.  You may return to the Emergency Department at any time for worsening condition or any new symptoms that concern you.    Please follow up with the gastroenterologist as previously planned.  If you have any increase in the swelling in your mouth or throat, or if you develop any difficulty swallowing or breathing, call 911 and return to the emergency department immediately.     Allergies An allergy is an abnormal reaction to a substance by the body's defense system (immune system). Allergies can develop at any age. WHAT CAUSES ALLERGIES? An allergic reaction happens when the immune system mistakenly reacts to a normally harmless substance, called an allergen, as if it were harmful. The immune system releases antibodies to fight the substance. Antibodies eventually release a chemical called histamine into the bloodstream. The release of histamine is meant to protect the body from infection, but it also causes discomfort. An allergic reaction can be triggered by:  Eating an allergen.  Inhaling an allergen.  Touching an allergen. WHAT TYPES OF ALLERGIES ARE THERE? There are many types of allergies. Common types include:  Seasonal allergies. People with this type of allergy are usually allergic to substances that are only present during certain seasons, such as molds and pollens.  Food allergies.  Drug allergies.  Insect allergies.  Animal dander allergies. WHAT ARE SYMPTOMS OF ALLERGIES? Possible allergy symptoms include:  Swelling of the lips, face, tongue, mouth, or throat.  Sneezing, coughing, or wheezing.  Nasal congestion.  Tingling in the mouth.  Rash.  Itching.  Itchy, red, swollen areas of skin (hives).  Watery eyes.  Vomiting.  Diarrhea.  Dizziness.  Lightheadedness.  Fainting.  Trouble breathing or swallowing.  Chest  tightness.  Rapid heartbeat. HOW ARE ALLERGIES DIAGNOSED? Allergies are diagnosed with a medical and family history and one or more of the following:  Skin tests.  Blood tests.  A food diary. A food diary is a record of all the foods and drinks you have in a day and of all the symptoms you experience.  The results of an elimination diet. An elimination diet involves eliminating foods from your diet and then adding them back in one by one to find out if a certain food causes an allergic reaction. HOW ARE ALLERGIES TREATED? There is no cure for allergies, but allergic reactions can be treated with medicine. Severe reactions usually need to be treated at a hospital. HOW CAN REACTIONS BE PREVENTED? The best way to prevent an allergic reaction is by avoiding the substance you are allergic to. Allergy shots and medicines can also help prevent reactions in some cases. People with severe allergic reactions may be able to prevent a life-threatening reaction called anaphylaxis with a medicine given right after exposure to the allergen.   This information is not intended to replace advice given to you by your health care provider. Make sure you discuss any questions you have with your health care provider.   Document Released: 11/23/2002 Document Revised: 09/20/2014 Document Reviewed: 06/11/2014 Elsevier Interactive Patient Education [employer].

## 2015-07-19 NOTE — ED Provider Notes (Signed)
CSN: [id number]     Arrival date & time 07/19/15  0606 History   First MD Initiated Contact with Patient 07/19/15 [phone]     Chief Complaint  Patient presents with  . Headache  . Oral Swelling     (Consider location/radiation/quality/duration/timing/severity/associated sxs/prior Treatment) HPI   Pt p/w oral swelling that began this morning and itching of the skin that began last night.  Pt with hx recent admission for GI bleed, melanic stools.  EGD shows erosive gastritis and small duodenal ulcerPt is Jehovah's Witness and refused blood transfusion.  She was discharged yesterday.  This morning she woke up with swelling in her mouth and throat, feels that her breathing and swallowing are affected.  Has had upper abdominal pain that is unchanged from her hospitalization, last earlier this morning.  BM yesterday was lighter brown, did not appear melanic, no visible blood.  Continues to be lightheaded occasionally with standing.  Denies any rash, N/V, new dental pain.  Only new medication is protonix.  She is not on any blood pressure medications such as ACE inhibitor.   Past Medical History  Diagnosis Date  . Coronary artery disease   . Myocardial infarction ([place])   . Hypertension   . Depression   . GI bleed 07/17/2015  . Refusal of blood transfusions as patient is Jehovah's Witness    Past Surgical History  Procedure Laterality Date  . Abdominal hysterectomy    . Back surgery    . Esophagogastroduodenoscopy N/A 07/17/2015    Procedure: ESOPHAGOGASTRODUODENOSCOPY (EGD);  Surgeon: Wonda Horner, MD;  Location: [facility] ENDOSCOPY;  Service: Endoscopy;  Laterality: N/A;   Family History  Problem Relation Age of Onset  . Heart disease Father   . Heart disease Brother    Social History  Substance Use Topics  . Smoking status: Never Smoker   . Smokeless tobacco: Never Used  . Alcohol Use: Yes     Comment: rare   OB History    No data available     Review of Systems  All other systems  reviewed and are negative.     Allergies  Review of patient's allergies indicates no known allergies.  Home Medications   Prior to Admission medications   Medication Sig Start Date End Date Taking? Authorizing Provider  pantoprazole (PROTONIX) 40 MG tablet Take 1 tablet (40 mg total) by mouth 2 (two) times daily. 07/18/15  Yes Shela Leff, MD  pravastatin (PRAVACHOL) 20 MG tablet Take 20 mg by mouth daily.   Yes Historical Provider, MD  traMADol (ULTRAM) 50 MG tablet Take 1 tablet (50 mg total) by mouth every 6 (six) hours as needed (for pain). 07/18/15  Yes Shela Leff, MD  HYDROcodone-acetaminophen (NORCO/VICODIN) 5-325 MG per tablet Take 1 tablet by mouth every 6 (six) hours as needed for moderate pain. Patient not taking: Reported on 07/17/2015 11/13/13   Audelia Hives Presson, PA  metoprolol tartrate (LOPRESSOR) 25 MG tablet Take 1 tablet (25 mg total) by mouth 2 (two) times daily. Patient not taking: Reported on 07/17/2015 04/14/14   Harvie Heck, PA-C   BP 139/96 mmHg  Pulse 97  Temp(Src) 98.1 F (36.7 C) (Oral)  Resp 14  SpO2 99% Physical Exam  Constitutional: She appears well-developed and well-nourished. No distress.  HENT:  Head: Normocephalic and atraumatic.  Mouth/Throat: Uvula is midline and oropharynx is clear and moist. Mucous membranes are not dry. No uvula swelling. No oropharyngeal exudate, posterior oropharyngeal edema or posterior oropharyngeal erythema.  Sublingual edema elevating tongue.  Edema is soft.  No discoloration.  No induration or fluctuance.    Neck: Normal range of motion. Neck supple.  Cardiovascular: Normal rate and regular rhythm.   Pulmonary/Chest: Effort normal and breath sounds normal. No stridor. No respiratory distress. She has no wheezes. She has no rales.  Abdominal: Soft. She exhibits no distension. There is no tenderness. There is no rebound and no guarding.  Neurological: She is alert.  Skin: She is not diaphoretic.    Nursing note and vitals reviewed.   ED Course  Procedures (including critical care time) Labs Review Labs Reviewed  BASIC METABOLIC PANEL - Abnormal; Notable for the following:    Glucose, Bld 105 (*)    All other components within normal limits  CBC WITH DIFFERENTIAL/PLATELET - Abnormal; Notable for the following:    RBC 3.85 (*)    Hemoglobin 9.3 (*)    HCT 30.0 (*)    MCV 77.9 (*)    MCH 24.2 (*)    All other components within normal limits    Imaging Review Ct Abdomen Pelvis W Contrast  07/17/2015  CLINICAL DATA:  Dizziness.  Lower abdominal pain and black stool. EXAM: CT ABDOMEN AND PELVIS WITH CONTRAST TECHNIQUE: Multidetector CT imaging of the abdomen and pelvis was performed using the standard protocol following bolus administration of intravenous contrast. CONTRAST:  139mL OMNIPAQUE IOHEXOL 300 MG/ML  SOLN COMPARISON:  None FINDINGS: Lower chest: 3 mm nodule in right middle lobe is identified, image number 6/series 4. Hepatobiliary: No suspicious liver abnormality noted. The gallbladder is normal. No biliary dilatation. Pancreas: Unremarkable appearance of the pancreas. Spleen: The spleen is normal. Adrenals/Urinary Tract: The adrenal glands are normal. Stomach/Bowel: The stomach is within normal limits. The small bowel loops have a normal course and caliber. No obstruction. Normal appearance of the colon. Distal colonic diverticula noted. No acute inflammation. Vascular/Lymphatic: Calcified atherosclerotic disease involves the abdominal aorta. No aneurysm. No enlarged retroperitoneal or mesenteric adenopathy. No enlarged pelvic or inguinal lymph nodes. Reproductive: Previous hysterectomy.  No adnexal mass. Other: No free fluid or fluid collections. Musculoskeletal: The visualized bony structures are unremarkable. No aggressive lytic or sclerotic bone lesions. IMPRESSION: 1. No acute findings within the abdomen or pelvis. 2. Aortic atherosclerosis. 3. Right middle lobe nodule measures  3 mm. If the patient is at high risk for bronchogenic carcinoma, follow-up chest CT at 1 year is recommended. If the patient is at low risk, no follow-up is needed. This recommendation follows the consensus statement: Guidelines for Management of Small Pulmonary Nodules Detected on CT Scans: A Statement from the [place] as published in Radiology 2005; 237:395-400. Electronically Signed   By: Kerby Moors M.D.   On: 07/17/2015 10:05   I have personally reviewed and evaluated these images and lab results as part of my medical decision-making.   EKG Interpretation None       8:06 AM Pt reports improvement of swelling.    9:15 AM Pt reports continued improvement.  No longer having any discomfort with swallowing or breathing.    9:37 AM Discussed pt with Dr Colin Rhein who will also see the patient.    MDM   Final diagnoses:  Allergic reaction, initial encounter  Anemia, unspecified anemia type    Afebrile nontoxic patient with recent admission for GI bleed, found to have duodenal ulcer, Hgb now steady at 9.3.  Refused blood products for religious reasons.  Pt continues to have mild lightheadedness - discussed this at length and  recommended taking great care and taking precautions to prevent falls.  Pt, however, is not orthostatic here and VS are normal.  She notes her stools are now lighter and she has noticed no blood or melena.   She noted sublingual swelling this morning, some itching last night.  Likely allergic reaction.   Doubt ludwig's angina. Pt had few medications inpatient including tylenol, protonix, and medications for EGD (no op note available on EPIC presently).  Pt improved dramatically and quickly with single dose solu-medrol, benadryl, pepcid.  Discussed pt with Dr Colin Rhein including medications for home - considered using steroids given likely allergic reaction.  However, pt has known duodenal ulcer, recent GI bleeding, ongoing anemia from this and pt is Jehovah's Witness  and will not accept blood products.  Tongue swelling has improved dramatically and quickly.  Will not add steroids given possible risk of increased bleeding.  Will also not discontinue protonix. Will give patient strict return precautions.    I discussed all of the findings, recent and current lab values, decisions about medication and strict return precautions with patient and family members present (with patient's permission).    D/C home with benadryl, pepcid.  Close PCP, GI follow up. Discussed result, findings, treatment, and follow up  with patient.  Pt given return precautions.  Pt verbalizes understanding and agrees with plan.       Clayton Bibles, PA-C 07/19/15 1126  Julianne Rice, MD 07/20/15 [phone]

## 2015-07-28 DIAGNOSIS — Z8371 Family history of colonic polyps: Secondary | ICD-10-CM | POA: Diagnosis not present

## 2015-07-28 DIAGNOSIS — K279 Peptic ulcer, site unspecified, unspecified as acute or chronic, without hemorrhage or perforation: Secondary | ICD-10-CM | POA: Diagnosis not present

## 2015-07-29 DIAGNOSIS — Z23 Encounter for immunization: Secondary | ICD-10-CM | POA: Diagnosis not present

## 2015-07-29 DIAGNOSIS — L259 Unspecified contact dermatitis, unspecified cause: Secondary | ICD-10-CM | POA: Diagnosis not present

## 2015-08-20 DIAGNOSIS — K5730 Diverticulosis of large intestine without perforation or abscess without bleeding: Secondary | ICD-10-CM | POA: Diagnosis not present

## 2015-08-20 DIAGNOSIS — Z09 Encounter for follow-up examination after completed treatment for conditions other than malignant neoplasm: Secondary | ICD-10-CM | POA: Diagnosis not present

## 2015-08-20 DIAGNOSIS — Z86010 Personal history of colonic polyps: Secondary | ICD-10-CM | POA: Diagnosis not present

## 2015-09-04 DIAGNOSIS — Z01419 Encounter for gynecological examination (general) (routine) without abnormal findings: Secondary | ICD-10-CM | POA: Diagnosis not present

## 2015-09-04 DIAGNOSIS — M545 Low back pain: Secondary | ICD-10-CM | POA: Diagnosis not present

## 2015-09-04 DIAGNOSIS — Z0000 Encounter for general adult medical examination without abnormal findings: Secondary | ICD-10-CM | POA: Diagnosis not present

## 2015-09-04 DIAGNOSIS — I10 Essential (primary) hypertension: Secondary | ICD-10-CM | POA: Diagnosis not present

## 2015-09-04 DIAGNOSIS — R7303 Prediabetes: Secondary | ICD-10-CM | POA: Diagnosis not present

## 2015-11-19 DIAGNOSIS — K219 Gastro-esophageal reflux disease without esophagitis: Secondary | ICD-10-CM | POA: Insufficient documentation

## 2015-12-22 ENCOUNTER — Other Ambulatory Visit: Payer: Self-pay | Admitting: Family Medicine

## 2015-12-22 DIAGNOSIS — Z1231 Encounter for screening mammogram for malignant neoplasm of breast: Secondary | ICD-10-CM

## 2016-08-14 ENCOUNTER — Ambulatory Visit (HOSPITAL_COMMUNITY)
Admission: EM | Admit: 2016-08-14 | Discharge: 2016-08-14 | Disposition: A | Discharge status: Home / Self Care | Payer: Medicare Other | Attending: Emergency Medicine | Admitting: Emergency Medicine

## 2016-08-14 ENCOUNTER — Encounter (HOSPITAL_COMMUNITY): Payer: Self-pay | Admitting: Emergency Medicine

## 2016-08-14 DIAGNOSIS — J40 Bronchitis, not specified as acute or chronic: Secondary | ICD-10-CM | POA: Diagnosis not present

## 2016-08-14 MED ORDER — ALBUTEROL SULFATE HFA 108 (90 BASE) MCG/ACT IN AERS: 2.0000 | INHALATION_SPRAY | RESPIRATORY_TRACT | 0 refills | Status: DC | PRN

## 2016-08-14 MED ORDER — BENZONATATE 100 MG PO CAPS: 100.0000 mg | ORAL_CAPSULE | Freq: Three times a day (TID) | ORAL | 0 refills | Status: DC

## 2016-08-14 MED ORDER — AZITHROMYCIN 250 MG PO TABS: ORAL_TABLET | ORAL | 0 refills | Status: DC

## 2016-08-14 MED ORDER — PREDNISONE 50 MG PO TABS
ORAL_TABLET | ORAL | 0 refills | Status: DC
Start: 2016-08-14 — End: 2017-02-26

## 2016-08-14 NOTE — Discharge Instructions (Signed)
You have bronchitis. °Take azithromycin and prednisone as prescribed. °Use tessalon as needed for cough. °Use the albuterol every 4 hours as needed for wheezing or cough. °You should see improvement in the next 3-5 days. °If you develop fevers, difficulty breathing, or are just not getting better, please come back or go to the emergency room. ° °

## 2016-08-14 NOTE — ED Provider Notes (Signed)
[facility]    CSN: [id number] Arrival date & time: 08/14/16  1502     History   Chief Complaint Chief Complaint  Patient presents with  . Cough    HPI Tanya Walsh is a 60 y.o. female.   HPI  She is a 60 year old woman here for evaluation of cough. Her symptoms started about a week ago with nasal congestion, rhinorrhea, and cough. Symptoms have gradually been worsening, despite taking multiple over-the-counter medications. The cough is worse at night and she will hear wheezing at nighttime. The cough sometimes get so bad that she feels like she is choking. She also reports some throbbing pressure on the left side of her face and back of her head intermittently for the last few days. No known fevers.  Past Medical History:  Diagnosis Date  . Coronary artery disease   . Depression   . GI bleed 07/17/2015  . Hypertension   . Myocardial infarction   . Refusal of blood transfusions as patient is Jehovah's Witness     Patient Active Problem List   Diagnosis Date Noted  . Melena 07/17/2015  . Incidental lung nodule, less than or equal to 70mm 07/17/2015  . Chest pain at rest 04/16/2014  . Essential hypertension 04/16/2014  . Morbid obesity ([place]) 04/16/2014  . Family history of early CAD 04/16/2014    Past Surgical History:  Procedure Laterality Date  . ABDOMINAL HYSTERECTOMY    . BACK SURGERY    . ESOPHAGOGASTRODUODENOSCOPY N/A 07/17/2015   Procedure: ESOPHAGOGASTRODUODENOSCOPY (EGD);  Surgeon: Wonda Horner, MD;  Location: [facility] ENDOSCOPY;  Service: Endoscopy;  Laterality: N/A;    OB History    No data available       Home Medications    Prior to Admission medications   Medication Sig Start Date End Date Taking? Authorizing Provider  hydrochlorothiazide (HYDRODIURIL) 25 MG tablet Take 25 mg by mouth daily.   Yes Historical Provider, MD  lisinopril (PRINIVIL,ZESTRIL) 20 MG tablet Take 20 mg by mouth daily.   Yes Historical Provider, MD  pantoprazole  (PROTONIX) 40 MG tablet Take 1 tablet (40 mg total) by mouth 2 (two) times daily. 07/18/15  Yes Shela Leff, MD  pravastatin (PRAVACHOL) 20 MG tablet Take 20 mg by mouth daily.   Yes Historical Provider, MD  albuterol (PROVENTIL HFA;VENTOLIN HFA) 108 (90 Base) MCG/ACT inhaler Inhale 2 puffs into the lungs every 4 (four) hours as needed for wheezing or shortness of breath. 08/14/16   Melony Overly, MD  azithromycin (ZITHROMAX Z-PAK) 250 MG tablet Take 2 pills today, then 1 pill daily until gone. 08/14/16   Melony Overly, MD  benzonatate (TESSALON) 100 MG capsule Take 1 capsule (100 mg total) by mouth every 8 (eight) hours. 08/14/16   Melony Overly, MD  predniSONE (DELTASONE) 50 MG tablet Take 1 pill daily for 5 days. 08/14/16   Melony Overly, MD    Family History Family History  Problem Relation Age of Onset  . Heart disease Father   . Heart disease Brother     Social History Social History  Substance Use Topics  . Smoking status: Never Smoker  . Smokeless tobacco: Never Used  . Alcohol use Yes     Comment: rare     Allergies   Patient has no known allergies.   Review of Systems Review of Systems As in history of present illness  Physical Exam Triage Vital Signs ED Triage Vitals  Enc Vitals Group  BP 08/14/16 1542 150/80     Pulse Rate 08/14/16 1542 82     Resp 08/14/16 1542 18     Temp 08/14/16 1542 98.6 F (37 C)     Temp Source 08/14/16 1542 Oral     SpO2 08/14/16 1542 100 %     Weight --      Height --      Head Circumference --      Peak Flow --      Pain Score 08/14/16 1541 8     Pain Loc --      Pain Edu? --      Excl. in [place]? --    No data found.   Updated Vital Signs BP 150/80 (BP Location: Left Arm)   Pulse 82   Temp 98.6 F (37 C) (Oral)   Resp 18   SpO2 100%   Visual Acuity Right Eye Distance:   Left Eye Distance:   Bilateral Distance:    Right Eye Near:   Left Eye Near:    Bilateral Near:     Physical Exam  Constitutional: She is  oriented to person, place, and time. She appears well-developed and well-nourished. No distress.  HENT:  TMs normal bilaterally. No sinus tenderness. Nasal mucosa is normal. Oropharynx normal.  Neck: Neck supple.  Cardiovascular: Normal rate, regular rhythm and normal heart sounds.   No murmur heard. Pulmonary/Chest: Effort normal and breath sounds normal. No respiratory distress. She has no wheezes. She has no rales.  Lymphadenopathy:    She has no cervical adenopathy.  Neurological: She is alert and oriented to person, place, and time.     UC Treatments / Results  Labs (all labs ordered are listed, but only abnormal results are displayed) Labs Reviewed - No data to display  EKG  EKG Interpretation None       Radiology No results found.  Procedures Procedures (including critical care time)  Medications Ordered in UC Medications - No data to display   Initial Impression / Assessment and Plan / UC Course  I have reviewed the triage vital signs and the nursing notes.  Pertinent labs & imaging results that were available during my care of the patient were reviewed by me and considered in my medical decision making (see chart for details).  Clinical Course     Treatment for bronchitis with azithromycin and prednisone. Tessalon as needed for cough. Albuterol as needed for wheezing. Expect improvement in 3 days. Follow-up as needed.  Final Clinical Impressions(s) / UC Diagnoses   Final diagnoses:  Bronchitis    New Prescriptions New Prescriptions   ALBUTEROL (PROVENTIL HFA;VENTOLIN HFA) 108 (90 BASE) MCG/ACT INHALER    Inhale 2 puffs into the lungs every 4 (four) hours as needed for wheezing or shortness of breath.   AZITHROMYCIN (ZITHROMAX Z-PAK) 250 MG TABLET    Take 2 pills today, then 1 pill daily until gone.   BENZONATATE (TESSALON) 100 MG CAPSULE    Take 1 capsule (100 mg total) by mouth every 8 (eight) hours.   PREDNISONE (DELTASONE) 50 MG TABLET    Take 1  pill daily for 5 days.     Melony Overly, MD 08/14/16 [phone]

## 2016-08-14 NOTE — ED Triage Notes (Signed)
Pt c/o prod cough onset 1 week associated w/BA and cough increases at night, head pain, nasal drainage, sneezing  Denies fevers  Taking OTC meds w/no relief.   A&O x4... NAD

## 2016-11-09 ENCOUNTER — Other Ambulatory Visit: Payer: Self-pay | Admitting: Family Medicine

## 2016-11-09 DIAGNOSIS — N644 Mastodynia: Secondary | ICD-10-CM

## 2016-11-15 ENCOUNTER — Ambulatory Visit
Admission: RE | Admit: 2016-11-15 | Discharge: 2016-11-15 | Disposition: A | Discharge status: Home / Self Care | Payer: Medicare Other | Source: Ambulatory Visit | Attending: Family Medicine | Admitting: Family Medicine

## 2016-11-15 DIAGNOSIS — N644 Mastodynia: Secondary | ICD-10-CM

## 2017-02-26 ENCOUNTER — Emergency Department (HOSPITAL_COMMUNITY): Payer: Medicare Other

## 2017-02-26 ENCOUNTER — Encounter (HOSPITAL_COMMUNITY): Payer: Self-pay | Admitting: Emergency Medicine

## 2017-02-26 ENCOUNTER — Emergency Department (HOSPITAL_COMMUNITY)
Admission: EM | Admit: 2017-02-26 | Discharge: 2017-02-26 | Disposition: A | Discharge status: Home / Self Care | Payer: Medicare Other | Attending: Emergency Medicine | Admitting: Emergency Medicine

## 2017-02-26 DIAGNOSIS — J209 Acute bronchitis, unspecified: Secondary | ICD-10-CM | POA: Insufficient documentation

## 2017-02-26 DIAGNOSIS — I252 Old myocardial infarction: Secondary | ICD-10-CM | POA: Insufficient documentation

## 2017-02-26 DIAGNOSIS — I2510 Atherosclerotic heart disease of native coronary artery without angina pectoris: Secondary | ICD-10-CM | POA: Diagnosis not present

## 2017-02-26 DIAGNOSIS — I10 Essential (primary) hypertension: Secondary | ICD-10-CM | POA: Insufficient documentation

## 2017-02-26 DIAGNOSIS — R05 Cough: Secondary | ICD-10-CM | POA: Diagnosis present

## 2017-02-26 MED ORDER — AMOXICILLIN 500 MG PO CAPS: 500.0000 mg | ORAL_CAPSULE | Freq: Three times a day (TID) | ORAL | 0 refills | Status: DC

## 2017-02-26 MED ORDER — PREDNISONE 10 MG PO TABS: ORAL_TABLET | ORAL | 0 refills | Status: DC

## 2017-02-26 MED ORDER — ALBUTEROL SULFATE HFA 108 (90 BASE) MCG/ACT IN AERS: 2.0000 | INHALATION_SPRAY | RESPIRATORY_TRACT | 0 refills | Status: DC | PRN

## 2017-02-26 NOTE — ED Provider Notes (Signed)
[place] DEPT Provider Note   CSN: [id number] Arrival date & time: 02/26/17  0998  By signing my name below, I, Norris Cross, attest that this documentation has been prepared under the direction and in the presence of Caryl Ada, [place]. Electronically Signed: Perlie Mayo. Roselyn Meier. , ED Scribe. 02/26/17. 9:25 AM.   History   Chief Complaint Chief Complaint  Patient presents with  . Cough    HPI Tanya Walsh is a 61 y.o. female with hx of bronchitis, borderline diabetes mellitus who presents to the Emergency Department complaining of intermittent cough with onset x6 months.  Pt states that she has had intermittent coughing episodes for the past x6 months since being diagnosed with bronchitis. She was prescribed antibiotics and an inhaler at the time but states that her symptoms never resolved completely. Pt reports that x1 day ago she developed a productive cough. Pt reports cough, headache, abdominal pain, myalgia. She has take Mucinex, daily allergy medication, inhaler with no relief. Pt denies postnasal drainage. She denies hx of smoking, hx of lung complications. Of note, pt has a follow-up appointment with her PCP Dr. Doreene Nest on 03/06/17.   The history is provided by the patient. No language interpreter was used.    Past Medical History:  Diagnosis Date  . Coronary artery disease   . Depression   . GI bleed 07/17/2015  . Hypertension   . Myocardial infarction ([place])   . Refusal of blood transfusions as patient is Jehovah's Witness     Patient Active Problem List   Diagnosis Date Noted  . Melena 07/17/2015  . Incidental lung nodule, less than or equal to 84mm 07/17/2015  . Chest pain at rest 04/16/2014  . Essential hypertension 04/16/2014  . Morbid obesity ([place]) 04/16/2014  . Family history of early CAD 04/16/2014    Past Surgical History:  Procedure Laterality Date  . ABDOMINAL HYSTERECTOMY    . BACK SURGERY    . ESOPHAGOGASTRODUODENOSCOPY N/A  07/17/2015   Procedure: ESOPHAGOGASTRODUODENOSCOPY (EGD);  Surgeon: Wonda Horner, MD;  Location: [facility] ENDOSCOPY;  Service: Endoscopy;  Laterality: N/A;    OB History    No data available       Home Medications    Prior to Admission medications   Medication Sig Start Date End Date Taking? Authorizing Provider  albuterol (PROVENTIL HFA;VENTOLIN HFA) 108 (90 Base) MCG/ACT inhaler Inhale 2 puffs into the lungs every 4 (four) hours as needed for wheezing or shortness of breath. 08/14/16   Melony Overly, MD  azithromycin (ZITHROMAX Z-PAK) 250 MG tablet Take 2 pills today, then 1 pill daily until gone. 08/14/16   Melony Overly, MD  benzonatate (TESSALON) 100 MG capsule Take 1 capsule (100 mg total) by mouth every 8 (eight) hours. 08/14/16   Melony Overly, MD  hydrochlorothiazide (HYDRODIURIL) 25 MG tablet Take 25 mg by mouth daily.    [provider]  lisinopril (PRINIVIL,ZESTRIL) 20 MG tablet Take 20 mg by mouth daily.    [provider]  pantoprazole (PROTONIX) 40 MG tablet Take 1 tablet (40 mg total) by mouth 2 (two) times daily. 07/18/15   Shela Leff, MD  pravastatin (PRAVACHOL) 20 MG tablet Take 20 mg by mouth daily.    [provider]  predniSONE (DELTASONE) 50 MG tablet Take 1 pill daily for 5 days. 08/14/16   Melony Overly, MD    Family History Family History  Problem Relation Age of Onset  . Heart disease Father   .  Heart disease Brother     Social History Social History  Substance Use Topics  . Smoking status: Never Smoker  . Smokeless tobacco: Never Used  . Alcohol use Yes     Comment: rare     Allergies   Patient has no known allergies.   Review of Systems Review of Systems  HENT: Negative for congestion and postnasal drip.   Respiratory: Positive for cough.   Gastrointestinal: Positive for abdominal pain.  Musculoskeletal: Positive for myalgias.  Neurological: Positive for headaches.  All other systems reviewed and are  negative.    Physical Exam Updated Vital Signs BP 138/88 (BP Location: Right Arm)   Pulse 68   Temp 98.6 F (37 C) (Oral)   Resp 16   Ht 5\' 4"  (1.626 m)   Wt 190 lb (86.2 kg)   SpO2 100%   BMI 32.61 kg/m   Physical Exam  Constitutional: She appears well-developed and well-nourished. No distress.  HENT:  Head: Normocephalic and atraumatic.  Eyes: Conjunctivae are normal.  Neck: Neck supple.  Cardiovascular: Normal rate and regular rhythm.   No murmur heard. Pulmonary/Chest: Effort normal and breath sounds normal. No respiratory distress.  Abdominal: Soft. There is no tenderness.  Musculoskeletal: She exhibits no edema.  Neurological: She is alert.  Skin: Skin is warm and dry.  Psychiatric: She has a normal mood and affect.  Nursing note and vitals reviewed.    ED Treatments / Results   DIAGNOSTIC STUDIES: Oxygen Saturation is 10% on RA, normal by my interpretation.   COORDINATION OF CARE: 9:25 AM-Discussed next steps with pt. Pt verbalized understanding and is agreeable with the plan.    Labs (all labs ordered are listed, but only abnormal results are displayed) Labs Reviewed - No data to display  EKG  EKG Interpretation None       Radiology Dg Chest 2 View  Result Date: 02/26/2017 CLINICAL DATA:  Intermittent cough over the last 6 months.  Pain. EXAM: CHEST  2 VIEW COMPARISON:  Two-view chest x-ray 04/14/2014 FINDINGS: The heart size and mediastinal contours are within normal limits. Both lungs are clear. The visualized skeletal structures are unremarkable. IMPRESSION: Negative two view chest x-ray Electronically Signed   By: San Morelle M.D.   On: 02/26/2017 08:55    Procedures Procedures (including critical care time)  Medications Ordered in ED Medications - No data to display   Initial Impression / Assessment and Plan / ED Course  I have reviewed the triage vital signs and the nursing notes.  Pertinent labs & imaging results that  were available during my care of the patient were reviewed by me and considered in my medical decision making (see chart for details).       Final Clinical Impressions(s) / ED Diagnoses   Final diagnoses:  Acute bronchitis, unspecified organism    New Prescriptions Discharge Medication List as of 02/26/2017  9:51 AM    START taking these medications   Details  amoxicillin (AMOXIL) 500 MG capsule Take 1 capsule (500 mg total) by mouth 3 (three) times daily., Starting Sat 02/26/2017, Print       An After Visit Summary was printed and given to the patient.     Fransico [name], [place] 02/26/17 1113    Pattricia Boss, MD 02/28/17 2135

## 2017-02-26 NOTE — ED Triage Notes (Signed)
Pt states "ive had this bad cough off and on since December, I had bronchitis and I was given antibiotics. They gave me an inhaler and I used it up. And its really bad at night and im spitting up mucous. I hurt all over because of this cough."

## 2017-02-26 NOTE — Discharge Instructions (Signed)
See your Physician for recheck.  °

## 2017-02-26 NOTE — ED Notes (Signed)
PA at bedside.

## 2017-02-28 LAB — CBG MONITORING, ED: GLUCOSE-CAPILLARY: 120 mg/dL — AB (ref 65–99)

## 2017-03-09 DIAGNOSIS — Z862 Personal history of diseases of the blood and blood-forming organs and certain disorders involving the immune mechanism: Secondary | ICD-10-CM | POA: Insufficient documentation

## 2018-03-01 ENCOUNTER — Emergency Department (HOSPITAL_COMMUNITY)
Admission: EM | Admit: 2018-03-01 | Discharge: 2018-03-01 | Disposition: A | Discharge status: Home / Self Care | Payer: Medicare Other | Attending: Emergency Medicine | Admitting: Emergency Medicine

## 2018-03-01 ENCOUNTER — Other Ambulatory Visit: Payer: Self-pay

## 2018-03-01 ENCOUNTER — Emergency Department (HOSPITAL_COMMUNITY): Payer: Medicare Other

## 2018-03-01 DIAGNOSIS — Z79899 Other long term (current) drug therapy: Secondary | ICD-10-CM | POA: Diagnosis not present

## 2018-03-01 DIAGNOSIS — Y9301 Activity, walking, marching and hiking: Secondary | ICD-10-CM | POA: Insufficient documentation

## 2018-03-01 DIAGNOSIS — W19XXXA Unspecified fall, initial encounter: Secondary | ICD-10-CM

## 2018-03-01 DIAGNOSIS — Y999 Unspecified external cause status: Secondary | ICD-10-CM | POA: Diagnosis not present

## 2018-03-01 DIAGNOSIS — M545 Low back pain, unspecified: Secondary | ICD-10-CM

## 2018-03-01 DIAGNOSIS — R0789 Other chest pain: Secondary | ICD-10-CM

## 2018-03-01 DIAGNOSIS — W0110XA Fall on same level from slipping, tripping and stumbling with subsequent striking against unspecified object, initial encounter: Secondary | ICD-10-CM | POA: Diagnosis not present

## 2018-03-01 DIAGNOSIS — I10 Essential (primary) hypertension: Secondary | ICD-10-CM | POA: Insufficient documentation

## 2018-03-01 DIAGNOSIS — S82141A Displaced bicondylar fracture of right tibia, initial encounter for closed fracture: Secondary | ICD-10-CM | POA: Insufficient documentation

## 2018-03-01 DIAGNOSIS — Y929 Unspecified place or not applicable: Secondary | ICD-10-CM | POA: Diagnosis not present

## 2018-03-01 DIAGNOSIS — I2510 Atherosclerotic heart disease of native coronary artery without angina pectoris: Secondary | ICD-10-CM | POA: Diagnosis not present

## 2018-03-01 DIAGNOSIS — S8991XA Unspecified injury of right lower leg, initial encounter: Secondary | ICD-10-CM | POA: Diagnosis present

## 2018-03-01 MED ORDER — NAPROXEN 500 MG PO TABS: 500.0000 mg | ORAL_TABLET | Freq: Two times a day (BID) | ORAL | 0 refills | Status: DC

## 2018-03-01 MED ORDER — HYDROCODONE-ACETAMINOPHEN 5-325 MG PO TABS
1.0000 | ORAL_TABLET | Freq: Once | ORAL | Status: AC
  Administered 2018-03-01: 1 via ORAL
  Filled 2018-03-01: qty 1

## 2018-03-01 MED ORDER — CYCLOBENZAPRINE HCL 5 MG PO TABS: 5.0000 mg | ORAL_TABLET | Freq: Two times a day (BID) | ORAL | 0 refills | Status: DC | PRN

## 2018-03-01 MED ORDER — HYDROCODONE-ACETAMINOPHEN 5-325 MG PO TABS: 1.0000 | ORAL_TABLET | Freq: Four times a day (QID) | ORAL | 0 refills | Status: DC | PRN

## 2018-03-01 NOTE — Discharge Instructions (Signed)
Wear the knee brace at all times except when showering.  Use the walker to help you get around.  Apply ice or heat for comfort, which ever feels best, 20 minutes on 20 minutes off.  You may take naproxen twice daily for pain.  You may also take hydrocodone as needed for severe pain but do not drive, drink alcohol, or operate heavy machinery while taking this medicine as it may make you drowsy.  The same goes for the Flexeril which is a muscle relaxer.  You may cut these tablets in half if you find that they are very strong for you.  Follow-up with orthopedics for reevaluation of your symptoms.  Return to the emergency department immediately for any concerning signs or symptoms develop such as fevers, worsening headache, passing out, redness of the extremities.

## 2018-03-01 NOTE — ED Notes (Signed)
Pt back from x-ray.

## 2018-03-01 NOTE — ED Provider Notes (Signed)
Tanya Walsh Provider Note   CSN: [id number] Arrival date & time: 03/01/18  [place]     History   Chief Complaint Chief Complaint  Patient presents with  . Fall    HPI Tanya Walsh is a 62 y.o. female with history of CAD, depression, hypertension, MI presents for evaluation of acute onset, constant low back pain, bilateral knee pain, chest wall pain, and right-sided neck pain status post fall on Saturday 5 days ago.  She states that she was walking and stepped off of the carpet onto slick linoleum floor and fell backwards landing on her buttocks.  She denies head injury or loss of consciousness.  She denies any prodrome leading including shortness of breath, chest pain, lightheadedness.  She has been ambulatory since without difficulty.  She states the worst of her pain is in her low back radiating to the left hip.  Pain is sharp, worsens with bending, ambulation, and palpation.  She has had lumbar fusion surgery in the past as well as hardware removal subsequently thereafter.  She denies bowel or bladder incontinence, saddle anesthesia, fevers, or history of IV drug use.  She also notes aching pain to the bilateral knees and chest wall anteriorly and laterally.  This pain worsens with deep inspiration and movement.  Denies shortness of breath she notes a very dull generalized headache which she states is similar to headaches she has had in the past but more persistent.  Denies associated nausea, vomiting, fevers, photophobia, phonophobia.  She has tried ice, heat, and OTC arthritis medication with mild temporary relief of her symptoms.  The history is provided by the patient.    Past Medical History:  Diagnosis Date  . Coronary artery disease   . Depression   . GI bleed 07/17/2015  . Hypertension   . Myocardial infarction ([place])   . Refusal of blood transfusions as patient is Jehovah's Witness     Patient Active Problem List   Diagnosis Date  Noted  . Melena 07/17/2015  . Incidental lung nodule, less than or equal to 8mm 07/17/2015  . Chest pain at rest 04/16/2014  . Essential hypertension 04/16/2014  . Morbid obesity ([employer]) 04/16/2014  . Family history of early CAD 04/16/2014    Past Surgical History:  Procedure Laterality Date  . ABDOMINAL HYSTERECTOMY    . BACK SURGERY    . ESOPHAGOGASTRODUODENOSCOPY N/A 07/17/2015   Procedure: ESOPHAGOGASTRODUODENOSCOPY (EGD);  Surgeon: Wonda Horner, MD;  Location: [facility] ENDOSCOPY;  Service: Endoscopy;  Laterality: N/A;     OB History   None      Home Medications    Prior to Admission medications   Medication Sig Start Date End Date Taking? Authorizing Provider  albuterol (PROVENTIL HFA;VENTOLIN HFA) 108 (90 Base) MCG/ACT inhaler Inhale 2 puffs into the lungs every 4 (four) hours as needed for wheezing or shortness of breath. 02/26/17   Fransico [name], PA-C  amoxicillin (AMOXIL) 500 MG capsule Take 1 capsule (500 mg total) by mouth 3 (three) times daily. 02/26/17   Fransico [name], PA-C  azithromycin (ZITHROMAX Z-PAK) 250 MG tablet Take 2 pills today, then 1 pill daily until gone. 08/14/16   Melony Overly, MD  benzonatate (TESSALON) 100 MG capsule Take 1 capsule (100 mg total) by mouth every 8 (eight) hours. 08/14/16   Melony Overly, MD  cyclobenzaprine (FLEXERIL) 5 MG tablet Take 1 tablet (5 mg total) by mouth 2 (two) times daily as needed for muscle spasms.  03/01/18   Shalva Rozycki A, PA-C  hydrochlorothiazide (HYDRODIURIL) 25 MG tablet Take 25 mg by mouth daily.    [provider]  HYDROcodone-acetaminophen (NORCO/VICODIN) 5-325 MG tablet Take 1 tablet by mouth every 6 (six) hours as needed for severe pain. 03/01/18   Andreana Klingerman A, PA-C  lisinopril (PRINIVIL,ZESTRIL) 20 MG tablet Take 20 mg by mouth daily.    [provider]  naproxen (NAPROSYN) 500 MG tablet Take 1 tablet (500 mg total) by mouth 2 (two) times daily with a meal. 03/01/18   Kynzli Rease A, PA-C    pantoprazole (PROTONIX) 40 MG tablet Take 1 tablet (40 mg total) by mouth 2 (two) times daily. 07/18/15   Shela Leff, MD  pravastatin (PRAVACHOL) 20 MG tablet Take 20 mg by mouth daily.    [provider]  predniSONE (DELTASONE) 10 MG tablet 5,4,0,9,81 taper 02/26/17   Fransico [name], PA-C    Family History Family History  Problem Relation Age of Onset  . Heart disease Father   . Heart disease Brother     Social History Social History   Tobacco Use  . Smoking status: Never Smoker  . Smokeless tobacco: Never Used  Substance Use Topics  . Alcohol use: Yes    Comment: rare  . Drug use: No     Allergies   Patient has no known allergies.   Review of Systems Review of Systems  Constitutional: Negative for chills and fever.  Respiratory: Negative for shortness of breath.   Cardiovascular: Positive for chest pain.  Gastrointestinal: Negative for abdominal pain, nausea and vomiting.  Musculoskeletal: Positive for arthralgias, back pain, myalgias and neck pain.  Neurological: Positive for headaches. Negative for syncope, weakness, light-headedness and numbness.  All other systems reviewed and are negative.    Physical Exam Updated Vital Signs BP (!) 159/96 (BP Location: Right Arm)   Pulse 62   Temp 98.4 F (36.9 C) (Oral)   Resp 16   Ht 5\' 3"  (1.6 m)   Wt 89.4 kg (197 lb)   SpO2 98%   BMI 34.90 kg/m   Physical Exam  Constitutional: She is oriented to person, place, and time. She appears well-developed and well-nourished. No distress.  HENT:  Head: Normocephalic and atraumatic.  No Battle's signs, no raccoon's eyes, no rhinorrhea. No hemotympanum. No tenderness to palpation of the face or skull. No deformity, crepitus, or swelling noted.   Eyes: Conjunctivae are normal. Right eye exhibits no discharge. Left eye exhibits no discharge.  Neck: No JVD present. No tracheal deviation present.  Cardiovascular: Normal rate, regular rhythm, normal heart  sounds and intact distal pulses.  2+ radial and DP/PT pulses bl, negative Homan's bl   Pulmonary/Chest: Effort normal and breath sounds normal. She exhibits tenderness.  Equal rise and fall of chest, no increased work of breathing.  Speaking in full sentences without difficulty.  She has tenderness to palpation of the anterior lateral chest wall diffusely with no focal tenderness.  No deformity, crepitus, ecchymosis, or flail segment noted.  Abdominal: Soft. Bowel sounds are normal. She exhibits no distension. There is no tenderness. There is no guarding.  Musculoskeletal: She exhibits tenderness. She exhibits no edema.  Large midline lumbar surgical incision.  She has diffuse tenderness to palpation of the midline lumbar spine with bilateral paralumbar muscle tenderness, left greater than right.  There is left SI joint tenderness.  No tenderness to palpation of the hips bilaterally.  No midline thoracic or cervical spine tenderness noted, bilateral  paracervical muscle tenderness and spasm noted, right worse than left.  No deformity, crepitus, or step-off noted.  There is tenderness to palpation of the left LCL and right MCL.  No varus or valgus instability, negative anterior/posterior drawer test.  5/5 strength of BUE and BLE major muscle groups despite pain.  Neurological: She is alert and oriented to person, place, and time. No cranial nerve deficit or sensory deficit. She exhibits normal muscle tone.  Mental Status:  Alert, thought content appropriate, able to give a coherent history. Speech fluent without evidence of aphasia. Able to follow 2 step commands without difficulty.  Cranial Nerves:  II:  Peripheral visual fields grossly normal, pupils equal, round, reactive to light III,IV, VI: ptosis not present, extra-ocular motions intact bilaterally  V,VII: smile symmetric, facial light touch sensation equal VIII: hearing grossly normal to voice  X: uvula elevates symmetrically  XI: bilateral  shoulder shrug symmetric and strong XII: midline tongue extension without fassiculations Motor:  Normal tone. 5/5 strength of BUE and BLE major muscle groups including strong and equal grip strength and dorsiflexion/plantar flexion Sensory: light touch normal in all extremities. Cerebellar: normal finger-to-nose with bilateral upper extremities Gait: Ambulates with an antalgic gait but exhibits good balance.  She is able to Heel Walk and Toe Walk with some assistance by me.   Skin: Skin is warm and dry. No erythema.  Psychiatric: She has a normal mood and affect. Her behavior is normal.  Nursing note and vitals reviewed.    ED Treatments / Results  Labs (all labs ordered are listed, but only abnormal results are displayed) Labs Reviewed - No data to display  EKG None  Radiology Dg Ribs Bilateral W/chest  Result Date: 03/01/2018 CLINICAL DATA:  62 year old female status post fall 4 days ago. Pain. EXAM: BILATERAL RIBS AND CHEST - 4+ VIEW COMPARISON:  Chest radiograph 02/26/2017 and earlier. FINDINGS: Mildly lower lung volumes with mild crowding of lung markings. Mediastinal contours are stable and within normal limits. Visualized tracheal air column is within normal limits. No pneumothorax, pulmonary edema, pleural effusion or confluent pulmonary opacity. Negative visible bowel gas pattern. Oblique views of the bilateral ribs are provided. Normal thoracic segmentation. No acute displaced rib fracture identified. Mild to moderate thoracolumbar scoliosis. No acute osseous abnormality identified. IMPRESSION: No acute cardiopulmonary abnormality. No rib fracture or acute traumatic injury identified. Electronically Signed   By: Genevie Ann M.D.   On: 03/01/2018 21:31   Dg Lumbar Spine Complete  Result Date: 03/01/2018 CLINICAL DATA:  Slip and fall.  Back pain. EXAM: LUMBAR SPINE - COMPLETE 4+ VIEW COMPARISON:  CT abdomen and pelvis July 17, 2015 FINDINGS: Lumbar vertebral bodies intact. Stable  grade 1 L3-4 and L4-5 anterolisthesis. Status post L5-S1 interbody prosthesis with arthrodesis. Moderate lower lumbar facet arthropathy is similar. No destructive bony lesions. Phleboliths project in the pelvis. Sacroiliac joints are symmetric. IMPRESSION: 1. No acute fracture deformity. Stable grade 1 L3-4 and L4-5 anterolisthesis on a degenerative basis. 2. L5-S1 fusion . Electronically Signed   By: Elon Alas M.D.   On: 03/01/2018 21:29   Dg Knee Complete 4 Views Left  Result Date: 03/01/2018 CLINICAL DATA:  Patient fell Saturday walking across the fall and slipping. Knee pain. EXAM: LEFT KNEE - COMPLETE 4+ VIEW COMPARISON:  None. FINDINGS: Joint space narrowing of the femorotibial and patellofemoral compartments. No fracture or joint effusion. There is mild chondrocalcinosis of hyaline cartilage. Spurring of the tibial spines, tibial plateau and femoral condyles are in keeping with  osteoarthritis. IMPRESSION: Mild tricompartmental osteoarthritis with joint space narrowing, spurring and chondrocalcinosis. No fracture. Electronically Signed   By: Ashley Royalty M.D.   On: 03/01/2018 21:31   Dg Knee Complete 4 Views Right  Result Date: 03/01/2018 CLINICAL DATA:  Right knee pain after slip and fall EXAM: RIGHT KNEE - COMPLETE 4+ VIEW COMPARISON:  04/08/2010 FINDINGS: A fracture involving the periphery of the medial tibial plateau involving the medial rim is noted. Interval progression of degenerative spurring of the medial femoral condyle and tibial plateau. Spurring of the tibial spines as before. Posterior intra-articular loose bodies are demonstrated. No significant joint effusion. IMPRESSION: Tiny fracture involving the far periphery of the medial tibial plateau involving the medial rim. No significant joint effusion. No joint dislocation. Electronically Signed   By: Ashley Royalty M.D.   On: 03/01/2018 21:34   Dg Hip Unilat With Pelvis 2-3 Views Left  Result Date: 03/01/2018 CLINICAL DATA:  Fall  EXAM: DG HIP (WITH OR WITHOUT PELVIS) 2-3V LEFT COMPARISON:  None. FINDINGS: No acute fracture or dislocation. Unremarkable soft tissues. Postop changes in the lumbar spine are noted IMPRESSION: No acute bony pathology. Electronically Signed   By: Marybelle Killings M.D.   On: 03/01/2018 21:28    Procedures Procedures (including critical care time)  Medications Ordered in ED Medications  HYDROcodone-acetaminophen (NORCO/VICODIN) 5-325 MG per tablet 1 tablet (1 tablet Oral Given 03/01/18 2010)     Initial Impression / Assessment and Plan / ED Course  I have reviewed the triage vital signs and the nursing notes.  Pertinent labs & imaging results that were available during my care of the patient were reviewed by me and considered in my medical decision making (see chart for details).     Patient presents for evaluation of generalized pain after mechanical fall 5 days ago.  No head injury or loss of consciousness.  She is afebrile, hypertensive with improvement on reevaluation, has not had her home BP medications today.  She is neurovascularly intact, ambulatory without difficulty despite pain.  Compartments are soft.  No red flag signs concerning for cauda equina or spinal abscess.  She is a mild generalized headache which she states is similar to headaches she has had in the past.  She tells me it is more of a mild discomfort and headache.  No focal neurologic deficits, do not see need for emergent head imaging at this time.  No midline cervical or thoracic spine tenderness.  She is status post multiple lumbar surgeries.  Will obtain radiographs to assess for acute osseous abnormality.  Right knee x-ray shows a tiny fracture involving the far periphery of the medial tibial plateau involving the medial rim, no significant joint effusion, no joint dislocation.  Remainder of imaging is unremarkable. 10:10 PM Spoke with Dr.Hewitt with orthopedic surgery who recommends knee immobilizer, weightbearing as  tolerated, and pain medicine.  He recommends follow-up in the office on an outpatient basis.  The patient had improvement in her pain with hydrocodone.  She does have a walker at home and she does live with her daughter.  We will also give small amount of Flexeril and Norco for home.  Discussed appropriate use of these medications.  Discussed strict ED return precautions.  Patient will follow-up with Dr. Doran Durand on an outpatient basis.  Patient and patient's daughter verbalized understanding of and agreement with plan and patient is stable for discharge home at this time.  Final Clinical Impressions(s) / ED Diagnoses   Final diagnoses:  Fall  Closed fracture of right tibial plateau, initial encounter  Chest wall pain  Acute left-sided low back pain without sciatica    ED Discharge Orders        Ordered    HYDROcodone-acetaminophen (NORCO/VICODIN) 5-325 MG tablet  Every 6 hours PRN     03/01/18 2224    cyclobenzaprine (FLEXERIL) 5 MG tablet  2 times daily PRN     03/01/18 2224    naproxen (NAPROSYN) 500 MG tablet  2 times daily with meals     03/01/18 2224       Renita Papa, PA-C 03/02/18 1721    Davonna Belling, MD 03/03/18 1516

## 2018-03-01 NOTE — ED Notes (Signed)
Pt transported to xray 

## 2018-03-01 NOTE — ED Notes (Signed)
PA at bedside.

## 2018-03-01 NOTE — ED Triage Notes (Signed)
Patient states that she was walking across tile floor (per daughter the tile is slippery) on Saturday and fell. Denies LOC.

## 2018-03-01 NOTE — ED Notes (Signed)
Patient able to ambulate independently  

## 2018-03-01 NOTE — ED Notes (Signed)
Pa at bedside updating/discussing care/results

## 2018-03-28 ENCOUNTER — Other Ambulatory Visit: Payer: Self-pay

## 2018-03-28 ENCOUNTER — Emergency Department (HOSPITAL_COMMUNITY): Payer: Medicare Other

## 2018-03-28 ENCOUNTER — Emergency Department (HOSPITAL_COMMUNITY)
Admission: EM | Admit: 2018-03-28 | Discharge: 2018-03-29 | Disposition: A | Discharge status: Home / Self Care | Payer: Medicare Other | Attending: Emergency Medicine | Admitting: Emergency Medicine

## 2018-03-28 ENCOUNTER — Encounter (HOSPITAL_COMMUNITY): Payer: Self-pay

## 2018-03-28 DIAGNOSIS — R079 Chest pain, unspecified: Secondary | ICD-10-CM | POA: Insufficient documentation

## 2018-03-28 DIAGNOSIS — I2510 Atherosclerotic heart disease of native coronary artery without angina pectoris: Secondary | ICD-10-CM | POA: Insufficient documentation

## 2018-03-28 DIAGNOSIS — Z7982 Long term (current) use of aspirin: Secondary | ICD-10-CM | POA: Diagnosis not present

## 2018-03-28 DIAGNOSIS — I10 Essential (primary) hypertension: Secondary | ICD-10-CM | POA: Insufficient documentation

## 2018-03-28 DIAGNOSIS — E876 Hypokalemia: Secondary | ICD-10-CM

## 2018-03-28 DIAGNOSIS — Z79899 Other long term (current) drug therapy: Secondary | ICD-10-CM | POA: Insufficient documentation

## 2018-03-28 DIAGNOSIS — T502X5A Adverse effect of carbonic-anhydrase inhibitors, benzothiadiazides and other diuretics, initial encounter: Secondary | ICD-10-CM

## 2018-03-28 DIAGNOSIS — I252 Old myocardial infarction: Secondary | ICD-10-CM | POA: Insufficient documentation

## 2018-03-28 LAB — BASIC METABOLIC PANEL
ANION GAP: 11 (ref 5–15)
BUN: 10 mg/dL (ref 8–23)
CHLORIDE: 104 mmol/L (ref 98–111)
CO2: 23 mmol/L (ref 22–32)
Calcium: 9.4 mg/dL (ref 8.9–10.3)
Creatinine, Ser: 0.92 mg/dL (ref 0.44–1.00)
GFR calc Af Amer: >60 mL/min (ref >60)
GLUCOSE: 101 mg/dL — AB (ref 70–99)
POTASSIUM: 3.2 mmol/L — AB (ref 3.5–5.1)
Sodium: 138 mmol/L (ref 135–145)

## 2018-03-28 LAB — I-STAT TROPONIN, ED: Troponin i, poc: 0 ng/mL (ref 0.00–0.08)

## 2018-03-28 LAB — CBC
HEMATOCRIT: 41.8 % (ref 36.0–46.0)
Hemoglobin: 12.7 g/dL (ref 12.0–15.0)
MCH: 26.5 pg (ref 26.0–34.0)
MCHC: 30.4 g/dL (ref 30.0–36.0)
MCV: 87.3 fL (ref 78.0–100.0)
PLATELETS: 382 10*3/uL (ref 150–400)
RBC: 4.79 MIL/uL (ref 3.87–5.11)
RDW: 13.4 % (ref 11.5–15.5)
WBC: 6.1 10*3/uL (ref 4.0–10.5)

## 2018-03-28 MED ORDER — ASPIRIN 81 MG PO CHEW
324.0000 mg | CHEWABLE_TABLET | Freq: Once | ORAL | Status: AC
  Administered 2018-03-29: 273 mg via ORAL
  Filled 2018-03-28: qty 4

## 2018-03-28 MED ORDER — POTASSIUM CHLORIDE CRYS ER 20 MEQ PO TBCR
40.0000 meq | EXTENDED_RELEASE_TABLET | Freq: Once | ORAL | Status: AC
  Administered 2018-03-29: 40 meq via ORAL
  Filled 2018-03-28: qty 2

## 2018-03-28 NOTE — ED Provider Notes (Signed)
[facility] EMERGENCY DEPARTMENT Provider Note   CSN: [id number] Arrival date & time: 03/28/18  1609     History   Chief Complaint Chief Complaint  Patient presents with  . Chest Pain    HPI Tanya Walsh is a 62 y.o. female.  The history is provided by the patient.  She has history of hypertension and hyperlipidemia and comes in with an episode of chest pain.  She states that she was getting in her car when she developed a pressure feeling across her anterior chest which was associated with a sharp pain in her left shoulder.  She rated pain at 10/10.  Lasted about 25 minutes before resolving.  There was no associated dyspnea or nausea but she did have some diaphoresis.  Pain started at about 3PM.  She has been pain-free since then.  She had been evaluated for chest pain with a stress test in the past and this is reported to have been normal.  She denies history of diabetes and she is a non-smoker but there is a family history of premature coronary atherosclerosis.  Past Medical History:  Diagnosis Date  . Coronary artery disease   . Depression   . GI bleed 07/17/2015  . Hypertension   . Myocardial infarction ([place])   . Refusal of blood transfusions as patient is Jehovah's Witness     Patient Active Problem List   Diagnosis Date Noted  . Melena 07/17/2015  . Incidental lung nodule, less than or equal to 61mm 07/17/2015  . Chest pain at rest 04/16/2014  . Essential hypertension 04/16/2014  . Morbid obesity ([place]) 04/16/2014  . Family history of early CAD 04/16/2014    Past Surgical History:  Procedure Laterality Date  . ABDOMINAL HYSTERECTOMY    . BACK SURGERY    . ESOPHAGOGASTRODUODENOSCOPY N/A 07/17/2015   Procedure: ESOPHAGOGASTRODUODENOSCOPY (EGD);  Surgeon: Wonda Horner, MD;  Location: [facility] ENDOSCOPY;  Service: Endoscopy;  Laterality: N/A;     OB History   None      Home Medications    Prior to Admission medications   Medication Sig  Start Date End Date Taking? Authorizing Provider  aspirin 81 MG chewable tablet Chew 81 mg by mouth daily.   Yes [provider]  cyclobenzaprine (FLEXERIL) 5 MG tablet Take 1 tablet (5 mg total) by mouth 2 (two) times daily as needed for muscle spasms. 03/01/18  Yes Fawze, Mina A, PA-C  losartan-hydrochlorothiazide (HYZAAR) 50-12.5 MG tablet Take 1 tablet by mouth daily. 03/21/18  Yes [provider]  naproxen (NAPROSYN) 500 MG tablet Take 1 tablet (500 mg total) by mouth 2 (two) times daily with a meal. 03/01/18  Yes Fawze, Mina A, PA-C  pantoprazole (PROTONIX) 40 MG tablet Take 1 tablet (40 mg total) by mouth 2 (two) times daily. Patient taking differently: Take 40 mg by mouth 2 (two) times daily as needed (acid reflux, heart burn).  07/18/15  Yes Shela Leff, MD  albuterol (PROVENTIL HFA;VENTOLIN HFA) 108 (90 Base) MCG/ACT inhaler Inhale 2 puffs into the lungs every 4 (four) hours as needed for wheezing or shortness of breath. Patient not taking: Reported on 03/28/2018 02/26/17   Fransico [name], PA-C  benzonatate (TESSALON) 100 MG capsule Take 1 capsule (100 mg total) by mouth every 8 (eight) hours. Patient not taking: Reported on 03/28/2018 08/14/16   Melony Overly, MD  HYDROcodone-acetaminophen (NORCO/VICODIN) 5-325 MG tablet Take 1 tablet by mouth every 6 (six) hours as needed for severe pain.  Patient not taking: Reported on 03/28/2018 03/01/18   Rodell Perna A, PA-C  predniSONE (DELTASONE) 10 MG tablet [phone] taper Patient not taking: Reported on 03/28/2018 02/26/17   Fransico [name], PA-C    Family History Family History  Problem Relation Age of Onset  . Heart disease Father   . Heart disease Brother     Social History Social History   Tobacco Use  . Smoking status: Never Smoker  . Smokeless tobacco: Never Used  Substance Use Topics  . Alcohol use: Yes    Comment: rare  . Drug use: No     Allergies   Patient has no known allergies.   Review of  Systems Review of Systems  All other systems reviewed and are negative.    Physical Exam Updated Vital Signs BP 133/89   Pulse 62   Temp 98.1 F (36.7 C) (Oral)   Resp 20   Ht 5\' 4"  (1.626 m)   Wt 87.5 kg (193 lb)   SpO2 100%   BMI 33.13 kg/m   Physical Exam  Nursing note and vitals reviewed.  62 year old female, resting comfortably and in no acute distress. Vital signs are normal. Oxygen saturation is 100%, which is normal. Head is normocephalic and atraumatic. PERRLA, EOMI. Oropharynx is clear. Neck is nontender and supple without adenopathy or JVD. Back is nontender and there is no CVA tenderness. Lungs are clear without rales, wheezes, or rhonchi. Chest is nontender. Heart has regular rate and rhythm without murmur. Abdomen is soft, flat, nontender without masses or hepatosplenomegaly and peristalsis is normoactive. Extremities have 1+ edema, full range of motion is present. Skin is warm and dry without rash. Neurologic: Mental status is normal, cranial nerves are intact, there are no motor or sensory deficits.  ED Treatments / Results  Labs (all labs ordered are listed, but only abnormal results are displayed) Labs Reviewed  BASIC METABOLIC PANEL - Abnormal; Notable for the following components:      Result Value   Potassium 3.2 (*)    Glucose, Bld 101 (*)    All other components within normal limits  CBC  I-STAT TROPONIN, ED  I-STAT TROPONIN, ED  I-STAT TROPONIN, ED    EKG EKG Interpretation  Date/Time:  Tuesday March 28 2018 16:15:17 EDT Ventricular Rate:  92 PR Interval:  156 QRS Duration: 84 QT Interval:  366 QTC Calculation: 452 R Axis:   -12 Text Interpretation:  Normal sinus rhythm Moderate voltage criteria for LVH, may be normal variant Borderline ECG When compared with ECG of 07/17/2015, No significant change was found Confirmed by Delora Fuel ([id number]) on 03/28/2018 11:11:05 PM   Radiology Dg Chest 2 View  Result Date: 03/28/2018 CLINICAL  DATA:  Left-sided chest pain. EXAM: CHEST - 2 VIEW COMPARISON:  03/01/2018 FINDINGS: There is no focal parenchymal opacity. There is no pleural effusion or pneumothorax. The heart and mediastinal contours are unremarkable. The osseous structures are unremarkable. IMPRESSION: No active cardiopulmonary disease. Electronically Signed   By: Kathreen Devoid   On: 03/28/2018 17:08    Procedures Procedures   Medications Ordered in ED Medications  aspirin chewable tablet 324 mg (273 mg Oral Given 03/29/18 0019)  potassium chloride SA (K-DUR,KLOR-CON) CR tablet 40 mEq (40 mEq Oral Given 03/29/18 0019)     Initial Impression / Assessment and Plan / ED Course  I have reviewed the triage vital signs and the nursing notes.  Pertinent labs & imaging results that were available during my care of  the patient were reviewed by me and considered in my medical decision making (see chart for details).  Chest discomfort which could represent coronary artery disease.  Old records are reviewed, and she had an echocardiogram which showed some regional wall motion abnormality but never had confirmed heart attack.  Cardiology evaluation in 2015 included negative nuclear stress test and negative event monitor.  She has not had any event cardiac evaluation since then.  She has now been pain-free for approximately 8 hours.  Will check repeat troponin.  If negative, will send back to cardiologist for further outpatient work-up.  ED work-up has included normal troponin and normal chest x-ray.  Potassium is slightly low and it is noted that she does take hydrochlorothiazide.  She is given a dose of aspirin and potassium in the emergency department.  P troponin is normal.  She is advised to continue taking her daily aspirin, given prescription for nitroglycerin to use as needed.  Return precautions discussed.  Also given prescription for potassium supplementation.  Referred back to her cardiologist for outpatient work-up.  Final  Clinical Impressions(s) / ED Diagnoses   Final diagnoses:  Nonspecific chest pain    ED Discharge Orders        Ordered    nitroGLYCERIN (NITROSTAT) 0.4 MG SL tablet  Every 5 min PRN     03/29/18 0146    potassium chloride SA (K-DUR,KLOR-CON) 20 MEQ tablet  Daily     66/29/47 6546       Delora Fuel, MD 50/35/46 [phone]

## 2018-03-28 NOTE — ED Triage Notes (Signed)
Pt presents with 1 hour h/o L sided chest pain.  Pt reports pain radiates into L shoulder and feels pain down to her hand., reports diaphoresis; +shortness of breath; denies any nausea or headache.

## 2018-03-28 NOTE — ED Provider Notes (Signed)
Patient placed in Quick Look pathway, seen and evaluated   Chief Complaint: Chest pain   HPI:   Patient presents with chest pain described as heaviness. Radiation to shoulder, which is a sharp pain. Started <1hour ago. Associated Shortness of breath and diaphoresis. Was told she may have had mild heart attack several years ago when asked about cardiac history.  ROS: Chest pain, shortness of breath, diaphoresis  Physical Exam:   Gen: No distress  Neuro: Awake and Alert  Skin: Warm    Focused Exam: Lungs CTA, heart normal rate, rhythm, abdomen soft, non tender   Initiation of care has begun. The patient has been counseled on the process, plan, and necessity for staying for the completion/evaluation, and the remainder of the medical screening examination    Caryl Ada 50/72/25 7505    Delora Fuel, MD 18/33/58 [phone]

## 2018-03-29 LAB — I-STAT TROPONIN, ED: Troponin i, poc: 0 ng/mL (ref 0.00–0.08)

## 2018-03-29 MED ORDER — NITROGLYCERIN 0.4 MG SL SUBL: 0.4000 mg | SUBLINGUAL_TABLET | SUBLINGUAL | 0 refills | Status: DC | PRN

## 2018-03-29 MED ORDER — POTASSIUM CHLORIDE CRYS ER 20 MEQ PO TBCR: 20.0000 meq | EXTENDED_RELEASE_TABLET | Freq: Every day | ORAL | 0 refills | Status: DC

## 2018-03-29 NOTE — Discharge Instructions (Addendum)
Continue taking your aspirin every day.  If you have another episode of pain, take the nitroglycerin tablet. You may take up to three tablets, five minutes apart. If pain is not relieved by three nitroglycerin tablets, then come to the emergency department immediately!

## 2018-04-05 ENCOUNTER — Other Ambulatory Visit: Payer: Self-pay | Admitting: Family Medicine

## 2018-04-05 DIAGNOSIS — Z1231 Encounter for screening mammogram for malignant neoplasm of breast: Secondary | ICD-10-CM

## 2018-04-26 ENCOUNTER — Emergency Department (HOSPITAL_COMMUNITY): Payer: Medicare Other

## 2018-04-26 ENCOUNTER — Emergency Department (HOSPITAL_COMMUNITY)
Admission: EM | Admit: 2018-04-26 | Discharge: 2018-04-26 | Disposition: A | Discharge status: Home / Self Care | Payer: Medicare Other | Attending: Emergency Medicine | Admitting: Emergency Medicine

## 2018-04-26 ENCOUNTER — Encounter (HOSPITAL_COMMUNITY): Payer: Self-pay | Admitting: Emergency Medicine

## 2018-04-26 ENCOUNTER — Other Ambulatory Visit: Payer: Self-pay

## 2018-04-26 DIAGNOSIS — I10 Essential (primary) hypertension: Secondary | ICD-10-CM | POA: Diagnosis not present

## 2018-04-26 DIAGNOSIS — I951 Orthostatic hypotension: Secondary | ICD-10-CM | POA: Insufficient documentation

## 2018-04-26 DIAGNOSIS — I2510 Atherosclerotic heart disease of native coronary artery without angina pectoris: Secondary | ICD-10-CM | POA: Insufficient documentation

## 2018-04-26 DIAGNOSIS — R55 Syncope and collapse: Secondary | ICD-10-CM

## 2018-04-26 DIAGNOSIS — Z79899 Other long term (current) drug therapy: Secondary | ICD-10-CM | POA: Diagnosis not present

## 2018-04-26 DIAGNOSIS — Z7982 Long term (current) use of aspirin: Secondary | ICD-10-CM | POA: Diagnosis not present

## 2018-04-26 LAB — URINALYSIS, ROUTINE W REFLEX MICROSCOPIC
BILIRUBIN URINE: NEGATIVE
Glucose, UA: NEGATIVE mg/dL
Hgb urine dipstick: NEGATIVE
KETONES UR: NEGATIVE mg/dL
Leukocytes, UA: NEGATIVE
Nitrite: NEGATIVE
PH: 5 (ref 5.0–8.0)
Protein, ur: NEGATIVE mg/dL
Specific Gravity, Urine: 1.006 (ref 1.005–1.030)

## 2018-04-26 LAB — BASIC METABOLIC PANEL
Anion gap: 11 (ref 5–15)
BUN: 9 mg/dL (ref 8–23)
CALCIUM: 9.4 mg/dL (ref 8.9–10.3)
CO2: 22 mmol/L (ref 22–32)
Chloride: 106 mmol/L (ref 98–111)
Creatinine, Ser: 0.95 mg/dL (ref 0.44–1.00)
GFR calc Af Amer: >60 mL/min (ref >60)
GFR calc non Af Amer: >60 mL/min (ref >60)
Glucose, Bld: 90 mg/dL (ref 70–99)
Potassium: 3.4 mmol/L — ABNORMAL LOW (ref 3.5–5.1)
Sodium: 139 mmol/L (ref 135–145)

## 2018-04-26 LAB — CBC
HCT: 44.4 % (ref 36.0–46.0)
Hemoglobin: 13.2 g/dL (ref 12.0–15.0)
MCH: 26.2 pg (ref 26.0–34.0)
MCHC: 29.7 g/dL — ABNORMAL LOW (ref 30.0–36.0)
MCV: 88.3 fL (ref 78.0–100.0)
Platelets: 196 10*3/uL (ref 150–400)
RBC: 5.03 MIL/uL (ref 3.87–5.11)
RDW: 14.1 % (ref 11.5–15.5)
WBC: 6.1 10*3/uL (ref 4.0–10.5)

## 2018-04-26 LAB — TROPONIN I
Troponin I: <0.03 ng/mL (ref <0.03)
Troponin I: <0.03 ng/mL (ref <0.03)

## 2018-04-26 LAB — CBG MONITORING, ED: Glucose-Capillary: 75 mg/dL (ref 70–99)

## 2018-04-26 NOTE — ED Notes (Signed)
Pt ambulated independently to bathroom and back. Did c/o some residual weakness/lightheadedness which had almost completed resolved after 2-3 minutes of walking. VS non orthostatic on return to bed.

## 2018-04-26 NOTE — ED Provider Notes (Signed)
[facility] EMERGENCY DEPARTMENT Provider Note  CSN: [id number] Arrival date & time: 04/26/18 0150  Chief Complaint(s) Loss of Consciousness  HPI Tanya Walsh is a 62 y.o. female   The history is provided by the patient.  Loss of Consciousness   This is a new problem. The current episode started 3 to 5 hours ago. Episode frequency: once. The problem has been resolved. Length of episode of loss of consciousness: unsure. The problem is associated with standing up. Associated symptoms include light-headedness. Pertinent negatives include abdominal pain, back pain, bladder incontinence, chest pain, congestion, fever, focal weakness, headaches, malaise/fatigue, nausea and vomiting. Associated symptoms comments: Patient felt like she was going to pass out. She has tried nothing for the symptoms.   She reports that she began feeling the lightheadedness and near syncopal symptoms while walking to the bathroom.  While she was sitting on the toilet patient reports that her symptoms worsen.  She remembers attempting to stand but does not remember falling.   Started losartan 2-3 weeks ago.   Past Medical History Past Medical History:  Diagnosis Date  . Coronary artery disease   . Depression   . GI bleed 07/17/2015  . Hypertension   . Myocardial infarction ([place])   . Refusal of blood transfusions as patient is Jehovah's Witness    Patient Active Problem List   Diagnosis Date Noted  . Melena 07/17/2015  . Incidental lung nodule, less than or equal to 80mm 07/17/2015  . Chest pain at rest 04/16/2014  . Essential hypertension 04/16/2014  . Morbid obesity ([place]) 04/16/2014  . Family history of early CAD 04/16/2014   Home Medication(s) Prior to Admission medications   Medication Sig Start Date End Date Taking? Authorizing Provider  aspirin 81 MG chewable tablet Chew 81 mg by mouth daily.   Yes [provider]  losartan-hydrochlorothiazide (HYZAAR) 50-12.5 MG  tablet Take 1 tablet by mouth daily. 03/21/18  Yes [provider]  nitroGLYCERIN (NITROSTAT) 0.4 MG SL tablet Place 1 tablet (0.4 mg total) under the tongue every 5 (five) minutes as needed for chest pain. 6/59/93  Yes Delora Fuel, MD  pantoprazole (PROTONIX) 40 MG tablet Take 1 tablet (40 mg total) by mouth 2 (two) times daily. Patient taking differently: Take 40 mg by mouth 2 (two) times daily as needed (acid reflux, heart burn).  07/18/15  Yes Shela Leff, MD  potassium chloride SA (K-DUR,KLOR-CON) 20 MEQ tablet Take 1 tablet (20 mEq total) by mouth daily. 5/70/17  Yes Delora Fuel, MD  cyclobenzaprine (FLEXERIL) 5 MG tablet Take 1 tablet (5 mg total) by mouth 2 (two) times daily as needed for muscle spasms. Patient not taking: Reported on 04/26/2018 03/01/18   Rodell Perna A, PA-C  naproxen (NAPROSYN) 500 MG tablet Take 1 tablet (500 mg total) by mouth 2 (two) times daily with a meal. Patient not taking: Reported on 04/26/2018 03/01/18   Renita Papa, PA-C  Past Surgical History Past Surgical History:  Procedure Laterality Date  . ABDOMINAL HYSTERECTOMY    . BACK SURGERY    . ESOPHAGOGASTRODUODENOSCOPY N/A 07/17/2015   Procedure: ESOPHAGOGASTRODUODENOSCOPY (EGD);  Surgeon: Wonda Horner, MD;  Location: [facility] ENDOSCOPY;  Service: Endoscopy;  Laterality: N/A;   Family History Family History  Problem Relation Age of Onset  . Heart disease Father   . Heart disease Brother     Social History Social History   Tobacco Use  . Smoking status: Never Smoker  . Smokeless tobacco: Never Used  Substance Use Topics  . Alcohol use: Yes    Comment: rare  . Drug use: No   Allergies Patient has no known allergies.  Review of Systems Review of Systems  Constitutional: Negative for fever and malaise/fatigue.  HENT: Negative for congestion.     Cardiovascular: Positive for syncope. Negative for chest pain.  Gastrointestinal: Negative for abdominal pain, nausea and vomiting.  Genitourinary: Negative for bladder incontinence.  Musculoskeletal: Negative for back pain.  Neurological: Positive for light-headedness. Negative for focal weakness and headaches.   All other systems are reviewed and are negative for acute change except as noted in the HPI  Physical Exam Vital Signs  I have reviewed the triage vital signs BP (!) 170/98 (BP Location: Right Arm)   Pulse 71   Temp 98.6 F (37 C) (Oral)   Resp 16   Ht 5\' 4"  (1.626 m)   Wt 86.2 kg   SpO2 99%   BMI 32.61 kg/m   Physical Exam  Constitutional: She is oriented to person, place, and time. She appears well-developed and well-nourished. No distress.  HENT:  Head: Normocephalic and atraumatic.  Nose: Nose normal.  Eyes: Pupils are equal, round, and reactive to light. Conjunctivae and EOM are normal. Right eye exhibits no discharge. Left eye exhibits no discharge. No scleral icterus.  Neck: Normal range of motion. Neck supple.  Cardiovascular: Normal rate and regular rhythm. Exam reveals no gallop and no friction rub.  No murmur heard. Pulmonary/Chest: Effort normal and breath sounds normal. No stridor. No respiratory distress. She has no rales.  Abdominal: Soft. She exhibits no distension. There is no tenderness.  Musculoskeletal: She exhibits no edema or tenderness.  Neurological: She is alert and oriented to person, place, and time.  Mental Status:  Alert and oriented to person, place, and time.  Attention and concentration normal.  Speech clear.  Recent memory is intact  Cranial Nerves:  II Visual Fields: Intact to confrontation. Visual fields intact. III, IV, VI: Pupils equal and reactive to light and near. Full eye movement without nystagmus  V Facial Sensation: Normal. No weakness of masticatory muscles  VII: No facial weakness or asymmetry  VIII Auditory  Acuity: Grossly normal  IX/X: The uvula is midline; the palate elevates symmetrically  XI: Normal sternocleidomastoid and trapezius strength  XII: The tongue is midline. No atrophy or fasciculations.   Motor System: Muscle Strength: 5/5 and symmetric in the upper and lower extremities. No pronation or drift.  Muscle Tone: Tone and muscle bulk are normal in the upper and lower extremities.   Reflexes: DTRs: 1+ and symmetrical in all four extremities. No Clonus Coordination: Intact finger-to-nose, heel-to-shin. No tremor.  Sensation: Intact to light touch, and pinprick. Negative Romberg test.  Gait: Routine gait normal.   Skin: Skin is warm and dry. No rash noted. She is not diaphoretic. No erythema.  Psychiatric: She has a normal mood and affect.  Vitals reviewed.  ED Results and Treatments Labs (all labs ordered are listed, but only abnormal results are displayed) Labs Reviewed  BASIC METABOLIC PANEL - Abnormal; Notable for the following components:      Result Value   Potassium 3.4 (*)    All other components within normal limits  CBC - Abnormal; Notable for the following components:   MCHC 29.7 (*)    All other components within normal limits  URINALYSIS, ROUTINE W REFLEX MICROSCOPIC - Abnormal; Notable for the following components:   Color, Urine STRAW (*)    All other components within normal limits  TROPONIN I  TROPONIN I  CBG MONITORING, ED                                                                                                                         EKG  EKG Interpretation  Date/Time:  Wednesday April 26 2018 05:46:39 EDT Ventricular Rate:  58 PR Interval:    QRS Duration: 85 QT Interval:  423 QTC Calculation: 416 R Axis:   28 Text Interpretation:  Sinus rhythm No significant change since last tracing Confirmed by Addison Lank [phone]) on 04/26/2018 5:57:11 AM      Radiology Ct Head Wo Contrast  Result Date: 04/26/2018 CLINICAL DATA:  Patient  passed out and fell. Head trauma. EXAM: CT HEAD WITHOUT CONTRAST TECHNIQUE: Contiguous axial images were obtained from the base of the skull through the vertex without intravenous contrast. COMPARISON:  05/05/2012 FINDINGS: Brain: No evidence of acute infarction, hemorrhage, hydrocephalus, extra-axial collection or mass lesion/mass effect. Vascular: No hyperdense vessel or unexpected calcification. Skull: Normal. Negative for fracture or focal lesion. Sinuses/Orbits: Opacification of the right frontal sinus and right ethmoid air cells. This may be due to sinusitis. Mastoid air cells are clear. Other: None. IMPRESSION: No acute intracranial abnormalities. Opacification of left frontal and ethmoid air cells. Electronically Signed   By: Lucienne Capers M.D.   On: 04/26/2018 05:11   Pertinent labs & imaging results that were available during my care of the patient were reviewed by me and considered in my medical decision making (see chart for details).  Medications Ordered in ED Medications - No data to display                                                                                                                                  Procedures Procedures  (including critical care time)  Medical Decision Making / ED Course I have reviewed the nursing notes for this encounter and the patient's prior records (if available in EHR or on provided paperwork).    Syncopal episode in the setting of orthostasis and micturition.  EKG without ischemic changes or dysrhythmias.  Troponin negative x2.  No blocks.  Hemoglobin stable.  Patient with mild hypokalemia but otherwise no electrolyte derangements.  UA negative.  CT head without ICH.  Exam nonfocal.   Orthostatic BPs were reassuring however patient was symptomatic.  She was given oral hydration.  On reassessment, postural lightheadedness resolved.  Patient was able to ambulate without complication.  .The patient appears reasonably screened and/or  stabilized for discharge and I doubt any other medical condition or other [facility] requiring further screening, evaluation, or treatment in the ED at this time prior to discharge.  The patient is safe for discharge with strict return precautions.   Final Clinical Impression(s) / ED Diagnoses Final diagnoses:  Syncope and collapse  Orthostasis    Disposition: Discharge  Condition: Good  I have discussed the results, Dx and Tx plan with the patient who expressed understanding and agree(s) with the plan. Discharge instructions discussed at great length. The patient was given strict return precautions who verbalized understanding of the instructions. No further questions at time of discharge.    ED Discharge Orders    None       Follow Up: Loraine [facility] [place] [address] [phone]  Schedule an appointment as soon as possible for a visit  in 3-5 days, If symptoms do not improve or  worsen     This chart was dictated using voice recognition software.  Despite best efforts to proofread,  errors can occur which can change the documentation meaning.   Fatima Blank, MD 04/26/18 [phone]

## 2018-04-26 NOTE — ED Triage Notes (Signed)
Pt reports she woke up to pee with bilateral leg cramping. When she got to the bathroom she thinks she passed out b/c she woke up on the floor. Denies CP, SOB

## 2018-08-26 ENCOUNTER — Encounter (HOSPITAL_COMMUNITY): Payer: Self-pay | Admitting: Emergency Medicine

## 2018-08-26 ENCOUNTER — Ambulatory Visit (HOSPITAL_COMMUNITY)
Admission: EM | Admit: 2018-08-26 | Discharge: 2018-08-26 | Disposition: A | Discharge status: Home / Self Care | Payer: Medicare Other | Attending: Family Medicine | Admitting: Family Medicine

## 2018-08-26 ENCOUNTER — Ambulatory Visit (INDEPENDENT_AMBULATORY_CARE_PROVIDER_SITE_OTHER): Payer: Medicare Other

## 2018-08-26 ENCOUNTER — Other Ambulatory Visit: Payer: Self-pay

## 2018-08-26 DIAGNOSIS — J069 Acute upper respiratory infection, unspecified: Secondary | ICD-10-CM | POA: Diagnosis not present

## 2018-08-26 DIAGNOSIS — B9789 Other viral agents as the cause of diseases classified elsewhere: Secondary | ICD-10-CM | POA: Diagnosis not present

## 2018-08-26 MED ORDER — BENZONATATE 100 MG PO CAPS: 100.0000 mg | ORAL_CAPSULE | Freq: Three times a day (TID) | ORAL | 0 refills | Status: DC

## 2018-08-26 MED ORDER — GUAIFENESIN ER 600 MG PO TB12: 600.0000 mg | ORAL_TABLET | Freq: Two times a day (BID) | ORAL | 0 refills | Status: DC

## 2018-08-26 NOTE — Discharge Instructions (Signed)
X-ray did not reveal any pneumonia This is most likely a viral upper respiratory infection I am prescribing some benzonatate capsules to take every 8 hours as needed for cough Mucinex to help with the cough and congestion Follow-up with your PCP for continued or worsening symptoms

## 2018-08-26 NOTE — ED Triage Notes (Signed)
The patient presented to the [facility] with a complaint of a cough x 5 days. The patient stated that she saw her PCP and was diagnosed with a URI but was not prescribed any medications.

## 2018-08-27 NOTE — ED Provider Notes (Signed)
[facility]    CSN: [id number] Arrival date & time: 08/26/18  1345     History   Chief Complaint Chief Complaint  Patient presents with  . Cough    HPI Tanya Walsh is a 62 y.o. female.   Patient is a 62 year old female with past medical history of CAD, MI, hypertension.  She presents for approximately 6 days of cough, congestion.  She was seen by her primary care provider on Wednesday the day after her symptoms started and diagnosed with a viral upper respiratory infection.  She has not been taking anything for symptoms.  Her symptoms have been persistent and worsening.  She denies any associated fever, chills, body aches.  The worst symptom is a cough which is keeping her up at night.  ROS per HPI      Past Medical History:  Diagnosis Date  . Coronary artery disease   . Depression   . GI bleed 07/17/2015  . Hypertension   . Myocardial infarction ([place])   . Refusal of blood transfusions as patient is Jehovah's Witness     Patient Active Problem List   Diagnosis Date Noted  . Melena 07/17/2015  . Incidental lung nodule, less than or equal to 5mm 07/17/2015  . Chest pain at rest 04/16/2014  . Essential hypertension 04/16/2014  . Morbid obesity ([place]) 04/16/2014  . Family history of early CAD 04/16/2014    Past Surgical History:  Procedure Laterality Date  . ABDOMINAL HYSTERECTOMY    . BACK SURGERY    . ESOPHAGOGASTRODUODENOSCOPY N/A 07/17/2015   Procedure: ESOPHAGOGASTRODUODENOSCOPY (EGD);  Surgeon: Wonda Horner, MD;  Location: [facility] ENDOSCOPY;  Service: Endoscopy;  Laterality: N/A;    OB History   No obstetric history on file.      Home Medications    Prior to Admission medications   Medication Sig Start Date End Date Taking? Authorizing Provider  aspirin 81 MG chewable tablet Chew 81 mg by mouth daily.    [provider]  benzonatate (TESSALON) 100 MG capsule Take 1 capsule (100 mg total) by mouth every 8 (eight) hours. 08/26/18    Loura Halt A, NP  cyclobenzaprine (FLEXERIL) 5 MG tablet Take 1 tablet (5 mg total) by mouth 2 (two) times daily as needed for muscle spasms. Patient not taking: Reported on 04/26/2018 03/01/18   Rodell Perna A, PA-C  guaiFENesin (MUCINEX) 600 MG 12 hr tablet Take 1 tablet (600 mg total) by mouth 2 (two) times daily. 08/26/18   Loura Halt A, NP  losartan-hydrochlorothiazide (HYZAAR) 50-12.5 MG tablet Take 1 tablet by mouth daily. 03/21/18   [provider]  naproxen (NAPROSYN) 500 MG tablet Take 1 tablet (500 mg total) by mouth 2 (two) times daily with a meal. Patient not taking: Reported on 04/26/2018 03/01/18   Rodell Perna A, PA-C  nitroGLYCERIN (NITROSTAT) 0.4 MG SL tablet Place 1 tablet (0.4 mg total) under the tongue every 5 (five) minutes as needed for chest pain. 12/25/22   Delora Fuel, MD  pantoprazole (PROTONIX) 40 MG tablet Take 1 tablet (40 mg total) by mouth 2 (two) times daily. Patient taking differently: Take 40 mg by mouth 2 (two) times daily as needed (acid reflux, heart burn).  07/18/15   Shela Leff, MD  potassium chloride SA (K-DUR,KLOR-CON) 20 MEQ tablet Take 1 tablet (20 mEq total) by mouth daily. 12/12/00   Delora Fuel, MD    Family History Family History  Problem Relation Age of Onset  . Heart disease Father   .  Heart disease Brother     Social History Social History   Tobacco Use  . Smoking status: Never Smoker  . Smokeless tobacco: Never Used  Substance Use Topics  . Alcohol use: Yes    Comment: rare  . Drug use: No     Allergies   Patient has no known allergies.   Review of Systems Review of Systems   Physical Exam Triage Vital Signs ED Triage Vitals [08/26/18 1454]  Enc Vitals Group     BP (!) 176/100     Pulse Rate 85     Resp 20     Temp 98 F (36.7 C)     Temp Source Oral     SpO2 100 %     Weight      Height      Head Circumference      Peak Flow      Pain Score 5     Pain Loc      Pain Edu?      Excl. in [place]?    No  data found.  Updated Vital Signs BP (!) 176/100 (BP Location: Right Arm)   Pulse 85   Temp 98 F (36.7 C) (Oral)   Resp 20   SpO2 100%   Visual Acuity Right Eye Distance:   Left Eye Distance:   Bilateral Distance:    Right Eye Near:   Left Eye Near:    Bilateral Near:     Physical Exam Vitals signs and nursing note reviewed.  Constitutional:      Appearance: Normal appearance.  HENT:     Head: Normocephalic and atraumatic.     Right Ear: Tympanic membrane, ear canal and external ear normal.     Left Ear: Tympanic membrane, ear canal and external ear normal.     Nose: Congestion present.     Mouth/Throat:     Mouth: Mucous membranes are moist.     Pharynx: Oropharynx is clear.  Eyes:     Conjunctiva/sclera: Conjunctivae normal.  Neck:     Musculoskeletal: Normal range of motion.  Cardiovascular:     Rate and Rhythm: Normal rate and regular rhythm.     Pulses: Normal pulses.     Heart sounds: Normal heart sounds.  Pulmonary:     Effort: Pulmonary effort is normal. No respiratory distress.     Breath sounds: Normal breath sounds. No stridor. No wheezing, rhonchi or rales.  Chest:     Chest wall: No tenderness.  Musculoskeletal: Normal range of motion.  Lymphadenopathy:     Cervical: No cervical adenopathy.  Skin:    General: Skin is warm and dry.  Neurological:     Mental Status: She is alert.  Psychiatric:        Mood and Affect: Mood normal.      UC Treatments / Results  Labs (all labs ordered are listed, but only abnormal results are displayed) Labs Reviewed - No data to display  EKG None  Radiology Dg Chest 2 View  Result Date: 08/26/2018 CLINICAL DATA:  Cough for a month. Wheezing and shortness of breath. EXAM: CHEST - 2 VIEW COMPARISON:  March 28, 2018 FINDINGS: The heart size and mediastinal contours are within normal limits. Both lungs are clear. The visualized skeletal structures are unremarkable. IMPRESSION: No active cardiopulmonary disease.  Electronically Signed   By: Dorise Bullion III M.D   On: 08/26/2018 15:55    Procedures Procedures (including critical care time)  Medications Ordered in UC  Medications - No data to display  Initial Impression / Assessment and Plan / UC Course  I have reviewed the triage vital signs and the nursing notes.  Pertinent labs & imaging results that were available during my care of the patient were reviewed by me and considered in my medical decision making (see chart for details).     X-ray was negative for pneumonia or bronchitis. Most likely viral upper respiratory infection as previously diagnosed. Will prescribe Tessalon Perles for cough Mucinex for cough and congestion She will need to follow-up with her primary care provider for continued or worsening symptoms. Final Clinical Impressions(s) / UC Diagnoses   Final diagnoses:  Viral URI with cough     Discharge Instructions     X-ray did not reveal any pneumonia This is most likely a viral upper respiratory infection I am prescribing some benzonatate capsules to take every 8 hours as needed for cough Mucinex to help with the cough and congestion Follow-up with your PCP for continued or worsening symptoms    ED Prescriptions    Medication Sig Dispense Auth. Provider   benzonatate (TESSALON) 100 MG capsule Take 1 capsule (100 mg total) by mouth every 8 (eight) hours. 21 capsule Rajiv Parlato A, NP   guaiFENesin (MUCINEX) 600 MG 12 hr tablet Take 1 tablet (600 mg total) by mouth 2 (two) times daily. 15 tablet Loura Halt A, NP     Controlled Substance Prescriptions [place] Controlled Substance Registry consulted? Not Applicable   Orvan July, NP 08/27/18 1311

## 2019-03-09 ENCOUNTER — Emergency Department (HOSPITAL_COMMUNITY)
Admission: EM | Admit: 2019-03-09 | Discharge: 2019-03-09 | Disposition: A | Discharge status: Home / Self Care | Payer: Medicare Other | Attending: Emergency Medicine | Admitting: Emergency Medicine

## 2019-03-09 ENCOUNTER — Encounter (HOSPITAL_COMMUNITY): Payer: Self-pay | Admitting: Emergency Medicine

## 2019-03-09 ENCOUNTER — Emergency Department (HOSPITAL_COMMUNITY): Payer: Medicare Other

## 2019-03-09 DIAGNOSIS — I2510 Atherosclerotic heart disease of native coronary artery without angina pectoris: Secondary | ICD-10-CM | POA: Diagnosis not present

## 2019-03-09 DIAGNOSIS — Z7982 Long term (current) use of aspirin: Secondary | ICD-10-CM | POA: Diagnosis not present

## 2019-03-09 DIAGNOSIS — I252 Old myocardial infarction: Secondary | ICD-10-CM | POA: Diagnosis not present

## 2019-03-09 DIAGNOSIS — Z79899 Other long term (current) drug therapy: Secondary | ICD-10-CM | POA: Diagnosis not present

## 2019-03-09 DIAGNOSIS — F419 Anxiety disorder, unspecified: Secondary | ICD-10-CM | POA: Insufficient documentation

## 2019-03-09 DIAGNOSIS — R079 Chest pain, unspecified: Secondary | ICD-10-CM

## 2019-03-09 DIAGNOSIS — I10 Essential (primary) hypertension: Secondary | ICD-10-CM | POA: Diagnosis not present

## 2019-03-09 LAB — BASIC METABOLIC PANEL
Anion gap: 9 (ref 5–15)
BUN: 9 mg/dL (ref 8–23)
CO2: 23 mmol/L (ref 22–32)
Calcium: 9.3 mg/dL (ref 8.9–10.3)
Chloride: 108 mmol/L (ref 98–111)
Creatinine, Ser: 0.95 mg/dL (ref 0.44–1.00)
GFR calc Af Amer: >60 mL/min (ref >60)
GFR calc non Af Amer: >60 mL/min (ref >60)
Glucose, Bld: 122 mg/dL — ABNORMAL HIGH (ref 70–99)
Potassium: 3.5 mmol/L (ref 3.5–5.1)
Sodium: 140 mmol/L (ref 135–145)

## 2019-03-09 LAB — CBC
HCT: 42.3 % (ref 36.0–46.0)
Hemoglobin: 13.7 g/dL (ref 12.0–15.0)
MCH: 28.1 pg (ref 26.0–34.0)
MCHC: 32.4 g/dL (ref 30.0–36.0)
MCV: 86.9 fL (ref 80.0–100.0)
Platelets: 306 10*3/uL (ref 150–400)
RBC: 4.87 MIL/uL (ref 3.87–5.11)
RDW: 13.4 % (ref 11.5–15.5)
WBC: 4 10*3/uL (ref 4.0–10.5)
nRBC: 0 % (ref 0.0–0.2)

## 2019-03-09 LAB — TROPONIN I (HIGH SENSITIVITY)
Troponin I (High Sensitivity): 6 ng/L (ref <18)
Troponin I (High Sensitivity): 6 ng/L (ref <18)

## 2019-03-09 MED ORDER — SODIUM CHLORIDE 0.9% FLUSH
3.0000 mL | Freq: Once | INTRAVENOUS | Status: AC
  Administered 2019-03-09: 3 mL via INTRAVENOUS

## 2019-03-09 MED ORDER — LORAZEPAM 0.5 MG PO TABS
0.2500 mg | ORAL_TABLET | Freq: Once | ORAL | Status: AC
  Administered 2019-03-09: 16:00:00 0.25 mg via ORAL
  Filled 2019-03-09: qty 1

## 2019-03-09 MED ORDER — ALUM & MAG HYDROXIDE-SIMETH 200-200-20 MG/5ML PO SUSP
30.0000 mL | Freq: Once | ORAL | Status: AC
  Administered 2019-03-09: 30 mL via ORAL
  Filled 2019-03-09: qty 30

## 2019-03-09 MED ORDER — SUCRALFATE 1 G PO TABS: 1.0000 g | ORAL_TABLET | Freq: Three times a day (TID) | ORAL | 0 refills | Status: DC

## 2019-03-09 MED ORDER — ACETAMINOPHEN 325 MG PO TABS
650.0000 mg | ORAL_TABLET | Freq: Once | ORAL | Status: AC
  Administered 2019-03-09: 650 mg via ORAL
  Filled 2019-03-09: qty 2

## 2019-03-09 NOTE — ED Notes (Signed)
Provider at bedside

## 2019-03-09 NOTE — ED Notes (Signed)
Pt ambulated to RR .  

## 2019-03-09 NOTE — ED Triage Notes (Signed)
Pt arrives by gems from home after having a sudden onset of substernal chest pain with a pressure in her chest- no sob. On arrival to ED after asa and 3 sl nitro pt states she just has a mild pressure in her chest but not pain. Pt also reports her heart feels like its racing.

## 2019-03-09 NOTE — ED Provider Notes (Signed)
[facility] EMERGENCY DEPARTMENT Provider Note   CSN: [id number] Arrival date & time: 03/09/19  4627     History   Chief Complaint Chief Complaint  Patient presents with   Chest Pain    HPI Tanya Walsh is a 63 y.o. female.     HPI Patient presents to the emergency department with chest pressure that started this morning.  The patient states that it seems to wax and wane.  Patient states that she has had heart attack in the past.  She states that he does have anxiety and she feels that this may be contributing to her symptoms.  Patient states that this does not seem similar to any chest pain that she is had in the past.  She feels like there is a pressure type sensation that lasted less than a minute.  The patient denies shortness of breath, headache,blurred vision, neck pain, fever, cough, weakness, numbness, dizziness, anorexia, edema, abdominal pain, nausea, vomiting, diarrhea, rash, back pain, dysuria, hematemesis, bloody stool, near syncope, or syncope. Past Medical History:  Diagnosis Date   Coronary artery disease    Depression    GI bleed 07/17/2015   Hypertension    Myocardial infarction [facility])    Refusal of blood transfusions as patient is Jehovah's Witness     Patient Active Problem List   Diagnosis Date Noted   Melena 07/17/2015   Incidental lung nodule, less than or equal to 41mm 07/17/2015   Chest pain at rest 04/16/2014   Essential hypertension 04/16/2014   Morbid obesity ([place]) 04/16/2014   Family history of early CAD 04/16/2014    Past Surgical History:  Procedure Laterality Date   ABDOMINAL HYSTERECTOMY     BACK SURGERY     ESOPHAGOGASTRODUODENOSCOPY N/A 07/17/2015   Procedure: ESOPHAGOGASTRODUODENOSCOPY (EGD);  Surgeon: Wonda Horner, MD;  Location: [facility] ENDOSCOPY;  Service: Endoscopy;  Laterality: N/A;     OB History   No obstetric history on file.      Home Medications    Prior to Admission medications     Medication Sig Start Date End Date Taking? Authorizing Provider  aspirin 81 MG chewable tablet Chew 81 mg by mouth daily.   Yes [provider]  losartan-hydrochlorothiazide (HYZAAR) 50-12.5 MG tablet Take 1 tablet by mouth daily. 03/21/18  Yes [provider]  nitroGLYCERIN (NITROSTAT) 0.4 MG SL tablet Place 1 tablet (0.4 mg total) under the tongue every 5 (five) minutes as needed for chest pain. 0/35/00  Yes Delora Fuel, MD  pantoprazole (PROTONIX) 40 MG tablet Take 1 tablet (40 mg total) by mouth 2 (two) times daily. Patient taking differently: Take 40 mg by mouth 2 (two) times daily as needed (acid reflux, heart burn).  07/18/15  Yes Shela Leff, MD  benzonatate (TESSALON) 100 MG capsule Take 1 capsule (100 mg total) by mouth every 8 (eight) hours. Patient not taking: Reported on 03/09/2019 08/26/18   Loura Halt A, NP  cyclobenzaprine (FLEXERIL) 5 MG tablet Take 1 tablet (5 mg total) by mouth 2 (two) times daily as needed for muscle spasms. Patient not taking: Reported on 04/26/2018 03/01/18   Rodell Perna A, PA-C  naproxen (NAPROSYN) 500 MG tablet Take 1 tablet (500 mg total) by mouth 2 (two) times daily with a meal. Patient not taking: Reported on 03/09/2019 03/01/18   Rodell Perna A, PA-C  potassium chloride SA (K-DUR,KLOR-CON) 20 MEQ tablet Take 1 tablet (20 mEq total) by mouth daily. Patient not taking: Reported on 03/09/2019 03/29/18  Delora Fuel, MD    Family History Family History  Problem Relation Age of Onset   Heart disease Father    Heart disease Brother     Social History Social History   Tobacco Use   Smoking status: Never Smoker   Smokeless tobacco: Never Used  Substance Use Topics   Alcohol use: Yes    Comment: rare   Drug use: No     Allergies   Patient has no known allergies.   Review of Systems Review of Systems  All other systems negative except as documented in the HPI. All pertinent positives and negatives as reviewed in  the HPI. Physical Exam Updated Vital Signs BP (!) 141/87    Pulse 62    Resp 13    Ht 5\' 3"  (1.6 m)    Wt 79.8 kg    SpO2 97%    BMI 31.18 kg/m   Physical Exam Vitals signs and nursing note reviewed.  Constitutional:      General: She is not in acute distress.    Appearance: She is well-developed.  HENT:     Head: Normocephalic and atraumatic.  Eyes:     Pupils: Pupils are equal, round, and reactive to light.  Neck:     Musculoskeletal: Normal range of motion and neck supple.  Cardiovascular:     Rate and Rhythm: Normal rate and regular rhythm.     Heart sounds: Normal heart sounds. No murmur. No friction rub. No gallop.   Pulmonary:     Effort: Pulmonary effort is normal. No respiratory distress.     Breath sounds: Normal breath sounds. No wheezing.  Abdominal:     General: Bowel sounds are normal. There is no distension.     Palpations: Abdomen is soft.     Tenderness: There is no abdominal tenderness.  Skin:    General: Skin is warm and dry.     Capillary Refill: Capillary refill takes less than 2 seconds.     Findings: No erythema or rash.  Neurological:     Mental Status: She is alert and oriented to person, place, and time.     Motor: No abnormal muscle tone.     Coordination: Coordination normal.  Psychiatric:        Mood and Affect: Mood is anxious.        Behavior: Behavior normal.      ED Treatments / Results  Labs (all labs ordered are listed, but only abnormal results are displayed) Labs Reviewed  BASIC METABOLIC PANEL - Abnormal; Notable for the following components:      Result Value   Glucose, Bld 122 (*)    All other components within normal limits  CBC  TROPONIN I (HIGH SENSITIVITY)  TROPONIN I (HIGH SENSITIVITY)  TROPONIN I (HIGH SENSITIVITY)    EKG EKG Interpretation  Date/Time:  Friday March 09 2019 10:07:49 EDT Ventricular Rate:  76 PR Interval:    QRS Duration: 84 QT Interval:  382 QTC Calculation: 430 R Axis:   16 Text  Interpretation:  Sinus rhythm no acute ST/T changes no significant change since Aug 2019 Confirmed by Sherwood Gambler [phone]) on 03/09/2019 11:01:38 AM   Radiology Dg Chest 2 View  Result Date: 03/09/2019 CLINICAL DATA:  63 year old female with sudden onset substernal and left chest pain. EXAM: CHEST - 2 VIEW COMPARISON:  08/26/2018 and earlier. FINDINGS: PA and lateral views. Lung volumes and mediastinal contours remain within normal limits. Visualized tracheal air column is within normal limits.  The lungs appear stable in clear. No pneumothorax or pleural effusion. Negative visible bowel gas pattern. No acute osseous abnormality identified. IMPRESSION: Negative.  No acute cardiopulmonary abnormality. Electronically Signed   By: Genevie Ann M.D.   On: 03/09/2019 10:53    Procedures Procedures (including critical care time)  Medications Ordered in ED Medications  sodium chloride flush (NS) 0.9 % injection 3 mL (3 mLs Intravenous Given 03/09/19 1019)  acetaminophen (TYLENOL) tablet 650 mg (650 mg Oral Given 03/09/19 1218)     Initial Impression / Assessment and Plan / ED Course  I have reviewed the triage vital signs and the nursing notes.  Pertinent labs & imaging results that were available during my care of the patient were reviewed by me and considered in my medical decision making (see chart for details).        The patient and I had a long discussion about her risk factors and heart score.  We also discussed being admitted for observation status versus being discharged home with close follow-up.  I gave the patient the pros and cons of both of these scenarios.  The patient voices an understanding of this.  I was called back to the room to discuss with the patient our previous conversation as her daughter states that she needs to be admitted to the hospital.  The patient is not having any current discomfort but does feel very anxious she states.  She asked me if this is the cause of this and  I advised her that that is not a direct cause but that it can contribute to any symptoms that she may be having.  I did advise her that this still could represent her heart but we do feel reassured by her testing here in the emergency department along with her EKG findings.  I advised her that in no way can we totally exclude her heart but we feel confident she is not having an acute coronary event.  I advised her that close follow-up will be important because this will need to be further examined as to the cause.  The patient does have a history of acid reflux and this certainly could contribute as well.  The patient has multiple factors that could be contributing to her symptoms but I did advise that cardiac chest pain could still be the cause.  I advised the patient to return here immediately for any changes or worsening in her condition.  The patient states that she understands this plan and all questions were answered.  I asked the patient to discuss with the daughter our discussion and then I would follow-up on how they felt about being observed in the hospital versus being discharged home.  I went and talked to the patient again about their discussion and both of them feel comfortable with this plan at this time.  I advised her that in no way does mean she cannot return to the hospital and it certainly does not mean that there cannot be a worsening in her condition. Final Clinical Impressions(s) / ED Diagnoses   Final diagnoses:  None    ED Discharge Orders    None       Dalia Heading, PA-C 03/10/19 0935    Sherwood Gambler, MD 03/13/19 [phone]

## 2019-03-09 NOTE — ED Notes (Signed)
ED Provider at bedside. 

## 2019-03-09 NOTE — Discharge Instructions (Addendum)
Follow-up as soon as possible with your doctor.  Return here as needed for any your condition.

## 2019-03-09 NOTE — ED Notes (Signed)
PA made aware pt wishes to speak to him about staying in the hospital.

## 2019-03-20 ENCOUNTER — Telehealth: Payer: Self-pay

## 2019-03-20 NOTE — Telephone Encounter (Signed)
NOTES [facility], SENT REFERRAL TO SCHEDULING

## 2019-04-10 ENCOUNTER — Other Ambulatory Visit: Payer: Self-pay | Admitting: Family Medicine

## 2019-04-10 DIAGNOSIS — Z780 Asymptomatic menopausal state: Secondary | ICD-10-CM

## 2019-04-10 DIAGNOSIS — Z1231 Encounter for screening mammogram for malignant neoplasm of breast: Secondary | ICD-10-CM

## 2019-05-08 ENCOUNTER — Other Ambulatory Visit: Payer: Self-pay

## 2019-05-08 ENCOUNTER — Emergency Department (HOSPITAL_COMMUNITY)
Admission: EM | Admit: 2019-05-08 | Discharge: 2019-05-08 | Disposition: A | Discharge status: Home / Self Care | Payer: Medicare Other | Attending: Emergency Medicine | Admitting: Emergency Medicine

## 2019-05-08 ENCOUNTER — Encounter (HOSPITAL_COMMUNITY): Payer: Self-pay | Admitting: Emergency Medicine

## 2019-05-08 ENCOUNTER — Emergency Department (HOSPITAL_COMMUNITY): Payer: Medicare Other

## 2019-05-08 DIAGNOSIS — I252 Old myocardial infarction: Secondary | ICD-10-CM | POA: Diagnosis not present

## 2019-05-08 DIAGNOSIS — R202 Paresthesia of skin: Secondary | ICD-10-CM | POA: Insufficient documentation

## 2019-05-08 DIAGNOSIS — Z79899 Other long term (current) drug therapy: Secondary | ICD-10-CM | POA: Insufficient documentation

## 2019-05-08 DIAGNOSIS — J329 Chronic sinusitis, unspecified: Secondary | ICD-10-CM | POA: Insufficient documentation

## 2019-05-08 DIAGNOSIS — Z7982 Long term (current) use of aspirin: Secondary | ICD-10-CM | POA: Diagnosis not present

## 2019-05-08 DIAGNOSIS — I2510 Atherosclerotic heart disease of native coronary artery without angina pectoris: Secondary | ICD-10-CM | POA: Insufficient documentation

## 2019-05-08 DIAGNOSIS — I10 Essential (primary) hypertension: Secondary | ICD-10-CM | POA: Diagnosis not present

## 2019-05-08 LAB — URINALYSIS, ROUTINE W REFLEX MICROSCOPIC
Bilirubin Urine: NEGATIVE
Glucose, UA: NEGATIVE mg/dL
Hgb urine dipstick: NEGATIVE
Ketones, ur: NEGATIVE mg/dL
Leukocytes,Ua: NEGATIVE
Nitrite: NEGATIVE
Protein, ur: NEGATIVE mg/dL
Specific Gravity, Urine: 1.023 (ref 1.005–1.030)
pH: 5 (ref 5.0–8.0)

## 2019-05-08 LAB — CBC
HCT: 45 % (ref 36.0–46.0)
Hemoglobin: 14.3 g/dL (ref 12.0–15.0)
MCH: 28.4 pg (ref 26.0–34.0)
MCHC: 31.8 g/dL (ref 30.0–36.0)
MCV: 89.3 fL (ref 80.0–100.0)
Platelets: 330 10*3/uL (ref 150–400)
RBC: 5.04 MIL/uL (ref 3.87–5.11)
RDW: 13.2 % (ref 11.5–15.5)
WBC: 4.6 10*3/uL (ref 4.0–10.5)
nRBC: 0 % (ref 0.0–0.2)

## 2019-05-08 LAB — BASIC METABOLIC PANEL
Anion gap: 10 (ref 5–15)
BUN: 8 mg/dL (ref 8–23)
CO2: 24 mmol/L (ref 22–32)
Calcium: 9.4 mg/dL (ref 8.9–10.3)
Chloride: 106 mmol/L (ref 98–111)
Creatinine, Ser: 0.91 mg/dL (ref 0.44–1.00)
GFR calc Af Amer: >60 mL/min (ref >60)
GFR calc non Af Amer: >60 mL/min (ref >60)
Glucose, Bld: 117 mg/dL — ABNORMAL HIGH (ref 70–99)
Potassium: 3.5 mmol/L (ref 3.5–5.1)
Sodium: 140 mmol/L (ref 135–145)

## 2019-05-08 LAB — CBG MONITORING, ED: Glucose-Capillary: 98 mg/dL (ref 70–99)

## 2019-05-08 MED ORDER — SODIUM CHLORIDE 0.9% FLUSH: 3.0000 mL | Freq: Once | INTRAVENOUS | Status: DC

## 2019-05-08 MED ORDER — LORAZEPAM 2 MG/ML IJ SOLN
1.0000 mg | Freq: Once | INTRAMUSCULAR | Status: AC
  Administered 2019-05-08: 1 mg via INTRAVENOUS
  Filled 2019-05-08: qty 1

## 2019-05-08 NOTE — ED Notes (Signed)
Patient verbalizes understanding of discharge instructions. Opportunity for questioning and answers were provided. Armband removed by staff, pt discharged from ED.  

## 2019-05-08 NOTE — ED Notes (Signed)
Patient transported to MRI 

## 2019-05-08 NOTE — ED Triage Notes (Signed)
Pt states starting at 1030 yesterday morning she began to feel very "faint" and week all over pt states she at times felt like she might pass out. Pt also states she had some numbness on her left side of her face and feels like her top lip is swollen. Pt denies any sob or trouble breathing.

## 2019-05-08 NOTE — Discharge Instructions (Addendum)
Follow-up with your primary care doctor tomorrow.  The MRI did not show evidence for stroke.  It is unclear what is causing the tingling sensation.  It is important to follow-up with your doctor for further evaluation and treatment.  For the sinus congestion seen on the MRI, use Flonase, nasal spray in each nostril once or twice a day for 3 weeks.

## 2019-05-08 NOTE — ED Provider Notes (Signed)
[facility] EMERGENCY DEPARTMENT Provider Note   CSN: [id number] Arrival date & time: 05/08/19  0906     History   Chief Complaint Chief Complaint  Patient presents with  . Numbness  . Dizziness    HPI Tanya Walsh is a 63 y.o. female.     HPI She presents for evaluation of a sensation of numbness in the left side of her face associated with headache, on and off since yesterday.  The numbness started this morning as she awakened.  He feels generally weak but does not describe focal weakness.  He was hospitalized several weeks ago and evaluate her cardiac disease.  She has been having daily chest pressure since that time.  She followed up with her PCP.  Apparently she did not have an acute cardiac event.  Has been eating well.  She denies fever, cough, shortness of breath,, or back pain.  Here by private vehicle.  There are no other known modifying factors.    Past Medical History:  Diagnosis Date  . Coronary artery disease   . Depression   . GI bleed 07/17/2015  . Hypertension   . Myocardial infarction ([place])   . Refusal of blood transfusions as patient is Jehovah's Witness     Patient Active Problem List   Diagnosis Date Noted  . Melena 07/17/2015  . Incidental lung nodule, less than or equal to 18mm 07/17/2015  . Chest pain at rest 04/16/2014  . Essential hypertension 04/16/2014  . Morbid obesity ([place]) 04/16/2014  . Family history of early CAD 04/16/2014    Past Surgical History:  Procedure Laterality Date  . ABDOMINAL HYSTERECTOMY    . BACK SURGERY    . ESOPHAGOGASTRODUODENOSCOPY N/A 07/17/2015   Procedure: ESOPHAGOGASTRODUODENOSCOPY (EGD);  Surgeon: Wonda Horner, MD;  Location: [facility] ENDOSCOPY;  Service: Endoscopy;  Laterality: N/A;     OB History   No obstetric history on file.      Home Medications    Prior to Admission medications   Medication Sig Start Date End Date Taking? Authorizing Provider  aspirin 81 MG chewable tablet  Chew 81 mg by mouth daily.    [provider]  benzonatate (TESSALON) 100 MG capsule Take 1 capsule (100 mg total) by mouth every 8 (eight) hours. Patient not taking: Reported on 03/09/2019 08/26/18   Loura Halt A, NP  cyclobenzaprine (FLEXERIL) 5 MG tablet Take 1 tablet (5 mg total) by mouth 2 (two) times daily as needed for muscle spasms. Patient not taking: Reported on 04/26/2018 03/01/18   Rodell Perna A, PA-C  losartan-hydrochlorothiazide (HYZAAR) 50-12.5 MG tablet Take 1 tablet by mouth daily. 03/21/18   [provider]  naproxen (NAPROSYN) 500 MG tablet Take 1 tablet (500 mg total) by mouth 2 (two) times daily with a meal. Patient not taking: Reported on 03/09/2019 03/01/18   Rodell Perna A, PA-C  nitroGLYCERIN (NITROSTAT) 0.4 MG SL tablet Place 1 tablet (0.4 mg total) under the tongue every 5 (five) minutes as needed for chest pain. [id number]   Delora Fuel, MD  pantoprazole (PROTONIX) 40 MG tablet Take 1 tablet (40 mg total) by mouth 2 (two) times daily. Patient taking differently: Take 40 mg by mouth 2 (two) times daily as needed (acid reflux, heart burn).  07/18/15   Shela Leff, MD  potassium chloride SA (K-DUR,KLOR-CON) 20 MEQ tablet Take 1 tablet (20 mEq total) by mouth daily. Patient not taking: Reported on [id number]   Delora Fuel, MD  sucralfate ([place])  1 g tablet Take 1 tablet (1 g total) by mouth 4 (four) times daily -  with meals and at bedtime. 03/09/19   Dalia Heading, PA-C    Family History Family History  Problem Relation Age of Onset  . Heart disease Father   . Heart disease Brother     Social History Social History   Tobacco Use  . Smoking status: Never Smoker  . Smokeless tobacco: Never Used  Substance Use Topics  . Alcohol use: Yes    Comment: rare  . Drug use: No     Allergies   Patient has no known allergies.   Review of Systems Review of Systems  All other systems reviewed and are negative.    Physical Exam  Updated Vital Signs BP (!) 146/86   Pulse 73   Temp 98.7 F (37.1 C) (Oral)   Resp (!) 21   SpO2 98%   Physical Exam Vitals signs and nursing note reviewed.  Constitutional:      Appearance: She is well-developed.  HENT:     Head: Normocephalic and atraumatic.     Right Ear: External ear normal.     Left Ear: External ear normal.     Mouth/Throat:     Mouth: Mucous membranes are moist.     Pharynx: No oropharyngeal exudate or posterior oropharyngeal erythema.     Comments: Very minimal swelling noted to the left upper lip.  No trismus. Eyes:     Conjunctiva/sclera: Conjunctivae normal.     Pupils: Pupils are equal, round, and reactive to light.  Neck:     Musculoskeletal: Normal range of motion and neck supple.     Trachea: Phonation normal.  Cardiovascular:     Rate and Rhythm: Normal rate and regular rhythm.     Heart sounds: Normal heart sounds.  Pulmonary:     Effort: Pulmonary effort is normal.     Breath sounds: Normal breath sounds.  Abdominal:     Palpations: Abdomen is soft.     Tenderness: There is no abdominal tenderness.  Musculoskeletal: Normal range of motion.  Skin:    General: Skin is warm and dry.  Neurological:     Mental Status: She is alert and oriented to person, place, and time.     Cranial Nerves: No cranial nerve deficit.     Sensory: No sensory deficit.     Motor: No weakness or abnormal muscle tone.     Coordination: Coordination normal.     Comments: Denies headache, aphasia or nystagmus.  No facial asymmetry.  Negative pronator drift.  No ataxia.  Normal coordination.  Psychiatric:        Mood and Affect: Mood normal.        Behavior: Behavior normal.        Thought Content: Thought content normal.        Judgment: Judgment normal.      ED Treatments / Results  Labs (all labs ordered are listed, but only abnormal results are displayed) Labs Reviewed  BASIC METABOLIC PANEL - Abnormal; Notable for the following components:       Result Value   Glucose, Bld 117 (*)    All other components within normal limits  CBC  URINALYSIS, ROUTINE W REFLEX MICROSCOPIC  CBG MONITORING, ED    EKG EKG Interpretation  Date/Time:  Tuesday May 08 2019 09:15:52 EDT Ventricular Rate:  75 PR Interval:  156 QRS Duration: 88 QT Interval:  384 QTC Calculation: 428 R Axis:  20 Text Interpretation:  Normal sinus rhythm with sinus arrhythmia Normal ECG since last tracing no significant change Confirmed by Daleen Bo [phone]) on 05/08/2019 11:52:28 AM   Radiology Mr Brain Wo Contrast  Result Date: 05/08/2019 CLINICAL DATA:  Weakness over the last several days. Numbness of the left side of the face. EXAM: MRI HEAD WITHOUT CONTRAST TECHNIQUE: Multiplanar, multiecho pulse sequences of the brain and surrounding structures were obtained without intravenous contrast. COMPARISON:  Head CT 04/27/2019 FINDINGS: Brain: The brain has a normal appearance without evidence of malformation, atrophy, old or acute small or large vessel infarction, mass lesion, hemorrhage, hydrocephalus or extra-axial collection. Vascular: Major vessels at the base of the brain show flow. Venous sinuses appear patent. Skull and upper cervical spine: Normal. Sinuses/Orbits: Opacification of the right division of the frontal sinus in the right anterior ethmoid sinuses. Other sinuses clear. Orbits negative. Other: None significant. IMPRESSION: Normal appearance of the brain. No cause of the presenting symptoms is identified. Right frontal and ethmoid sinus opacification. Electronically Signed   By: Nelson Chimes M.D.   On: 05/08/2019 14:30    Procedures Procedures (including critical care time)  Medications Ordered in ED Medications  sodium chloride flush (NS) 0.9 % injection 3 mL (has no administration in time range)  LORazepam (ATIVAN) injection 1 mg (1 mg Intravenous Given 05/08/19 1316)     Initial Impression / Assessment and Plan / ED Course  I have reviewed  the triage vital signs and the nursing notes.  Pertinent labs & imaging results that were available during my care of the patient were reviewed by me and considered in my medical decision making (see chart for details).  Clinical Course as of May 07 1450  Tue May 08, 2019  1447 Normal except glucose slightly high, nonfasting  Basic metabolic panel(!) [EW]  99991111 Normal  CBC [EW]  1448 Normal  Urinalysis, Routine w reflex microscopic [EW]  1448 No evidence for intracranial abnormality.  Mild right-sided sinus disease.  MR BRAIN WO CONTRAST [EW]    Clinical Course User Index [EW] Daleen Bo, MD        Patient Vitals for the past 24 hrs:  BP Temp Temp src Pulse Resp SpO2  05/08/19 1445 (!) 146/86 - - 73 (!) 21 98 %  05/08/19 1430 (!) 131/97 - - 60 20 95 %  05/08/19 1315 (!) 170/89 - - (!) 59 15 100 %  05/08/19 1300 (!) 151/93 - - 64 19 100 %  05/08/19 1230 (!) 176/81 - - (!) 50 14 99 %  05/08/19 1000 (!) 176/86 98.7 F (37.1 C) Oral 61 14 98 %    2:48 PM Reevaluation with update and discussion. After initial assessment and treatment, an updated evaluation reveals she states the numbness of her face is almost gone.  Findings discussed with the patient.  She denies symptoms of sinusitis.  She states she has an appointment with her PCP, tomorrow.  Findings discussed and questions answered. Daleen Bo   Medical Decision Making: Nonspecific paresthesia, spontaneous improvement without evidence for CVA or intracranial lesion.  No indication for further ED evaluation or hospitalization, at this time.  Suspect allergic rhinitis leading to mild sinus disease.  Will recommend treatment with nasal steroid and follow-up with PCP.  CRITICAL CARE-no Performed by: Daleen Bo  Nursing Notes Reviewed/ Care Coordinated Applicable Imaging Reviewed Interpretation of Laboratory Data incorporated into ED treatment  The patient appears reasonably screened and/or stabilized for discharge  and I doubt any other  medical condition or other [facility] requiring further screening, evaluation, or treatment in the ED at this time prior to discharge.  Plan: Home Medications-usual, plus Flonase; Home Treatments-rest, fluids; return here if the recommended treatment, does not improve the symptoms; Recommended follow up-PCP as scheduled   Final Clinical Impressions(s) / ED Diagnoses   Final diagnoses:  Paresthesia  Sinusitis, unspecified chronicity, unspecified location    ED Discharge Orders    None       Daleen Bo, MD 05/08/19 1451

## 2019-05-08 NOTE — ED Notes (Signed)
Pt returned to room from MRI.

## 2019-06-21 ENCOUNTER — Ambulatory Visit
Admission: RE | Admit: 2019-06-21 | Discharge: 2019-06-21 | Disposition: A | Discharge status: Home / Self Care | Payer: Medicare Other | Source: Ambulatory Visit | Attending: Family Medicine | Admitting: Family Medicine

## 2019-06-21 ENCOUNTER — Other Ambulatory Visit: Payer: Self-pay

## 2019-06-21 DIAGNOSIS — Z780 Asymptomatic menopausal state: Secondary | ICD-10-CM

## 2019-06-21 DIAGNOSIS — Z1231 Encounter for screening mammogram for malignant neoplasm of breast: Secondary | ICD-10-CM

## 2019-07-24 NOTE — Progress Notes (Signed)
Cardiology Office Note:    Date:  07/25/2019   ID:  Tanya, Walsh 1956/06/05, MRN [id number]  PCP:  Katherina Mires, MD  Cardiologist:  No primary care provider on file.   Referring MD: Katherina Mires, MD   Chief Complaint  Patient presents with  . Chest Pain    History of Present Illness:    Tanya Walsh is a 63 y.o. female with a hx of chest pain, hypertension, and "history of myocardial infarction" with no verifying data available..  This patient was seen by me greater than 5 years ago.  She has had a history of recurring chest pain.  She has had multiple emergency room visits with episodes of chest pain.  The most recent episode occurred in September 2020.  She awakened with chest pressure.  Chest pressure lasted 5 hours.  The pressure was in the central chest.  There was no associated dyspnea, diaphoresis, palpitation, or other symptom.  There is no exertional component.  She has had similar discomfort previously.  In the emergency room no evidence of injury was identified.  Risk factor profile includes prediabetes, hypertension, family history of vascular disease (mother-CVA died at 51 also with history of MI; brother-stents; father died at 69 of kidney disease; sister history of MI).  Never smoked.    Past Medical History:  Diagnosis Date  . Coronary artery disease   . Depression   . GI bleed 07/17/2015  . Hypertension   . Myocardial infarction ([place])   . Refusal of blood transfusions as patient is Jehovah's Witness     Past Surgical History:  Procedure Laterality Date  . ABDOMINAL HYSTERECTOMY    . BACK SURGERY    . ESOPHAGOGASTRODUODENOSCOPY N/A 07/17/2015   Procedure: ESOPHAGOGASTRODUODENOSCOPY (EGD);  Surgeon: Wonda Horner, MD;  Location: [facility] ENDOSCOPY;  Service: Endoscopy;  Laterality: N/A;    Current Medications: Current Meds  Medication Sig  . cyclobenzaprine (FLEXERIL) 5 MG tablet Take 1 tablet (5 mg total) by mouth 2 (two) times daily as needed  for muscle spasms.  [name] losartan-hydrochlorothiazide (HYZAAR) 50-12.5 MG tablet Take 1 tablet by mouth daily.  . nitroGLYCERIN (NITROSTAT) 0.4 MG SL tablet Place 1 tablet (0.4 mg total) under the tongue every 5 (five) minutes as needed for chest pain.  . pantoprazole (PROTONIX) 40 MG tablet Take 1 tablet (40 mg total) by mouth 2 (two) times daily.  . sertraline (ZOLOFT) 50 MG tablet Take 50 mg by mouth daily.     Allergies:   Patient has no known allergies.   Social History   Socioeconomic History  . Marital status: Widowed    Spouse name: Not on file  . Number of children: Not on file  . Years of education: Not on file  . Highest education level: Not on file  Occupational History  . Not on file  Social Needs  . Financial resource strain: Not on file  . Food insecurity    Worry: Not on file    Inability: Not on file  . Transportation needs    Medical: Not on file    Non-medical: Not on file  Tobacco Use  . Smoking status: Never Smoker  . Smokeless tobacco: Never Used  Substance and Sexual Activity  . Alcohol use: Yes    Comment: rare  . Drug use: No  . Sexual activity: Not on file  Lifestyle  . Physical activity    Days per week: Not on file    Minutes per  session: Not on file  . Stress: Not on file  Relationships  . Social Herbalist on phone: Not on file    Gets together: Not on file    Attends religious service: Not on file    Active member of club or organization: Not on file    Attends meetings of clubs or organizations: Not on file    Relationship status: Not on file  Other Topics Concern  . Not on file  Social History Narrative  . Not on file     Family History: The patient's family history includes Heart disease in her brother and father.  ROS:   Please see the history of present illness.    She had a hospital stay in [place].  She was told by her physician that she had a heart attack.  She cannot remember what the work-up was or what the  evidence for MI was.  She has low back discomfort.  She has been told she has rheumatoid arthritis but there is no verification for this diagnosis.  She has a suspected diagnosis of esophageal reflux.  Also have no verifying data to suggest this diagnosis is true.  All other systems reviewed and are negative.  She frequently awakens at night with her heart racing, diaphoretic, and also has nocturia.  She does have a history of snoring.  EKGs/Labs/Other Studies Reviewed:    The following studies were reviewed today: No recent imaging data  EKG:  EKG formed on 05/16/2019 is normal.  Recent Labs: 05/08/2019: BUN 8; Creatinine, Ser 0.91; Hemoglobin 14.3; Platelets 330; Potassium 3.5; Sodium 140  Recent Lipid Panel    Component Value Date/Time   CHOL  07/08/2009 0530    199        ATP III CLASSIFICATION:  <200     mg/dL   Desirable  200-239  mg/dL   Borderline High  >=240    mg/dL   High          TRIG 71 07/08/2009 0530   HDL 46 07/08/2009 0530   CHOLHDL 4.3 07/08/2009 0530   VLDL 14 07/08/2009 0530   LDLCALC (H) 07/08/2009 0530    139        Total Cholesterol/HDL:CHD Risk Coronary Heart Disease Risk Table                     Men   Women  1/2 Average Risk   3.4   3.3  Average Risk       5.0   4.4  2 X Average Risk   9.6   7.1  3 X Average Risk  23.4   11.0        Use the calculated Patient Ratio above and the CHD Risk Table to determine the patient's CHD Risk.        ATP III CLASSIFICATION (LDL):  <100     mg/dL   Optimal  100-129  mg/dL   Near or Above                    Optimal  130-159  mg/dL   Borderline  160-189  mg/dL   High  >190     mg/dL   Very High    Physical Exam:    VS:  BP 132/84   Pulse 75   Ht 5\' 3"  (1.6 m)   Wt 171 lb 6.4 oz (77.7 kg)   SpO2 96%   BMI 30.36 kg/m  Wt Readings from Last 3 Encounters:  07/25/19 171 lb 6.4 oz (77.7 kg)  03/09/19 176 lb (79.8 kg)  04/26/18 190 lb (86.2 kg)     GEN: Mild obesity. No acute distress HEENT: Normal  NECK: No JVD. LYMPHATICS: No lymphadenopathy CARDIAC:  RRR without murmur, gallop, or edema. VASCULAR:  Normal Pulses. No bruits. RESPIRATORY:  Clear to auscultation without rales, wheezing or rhonchi  ABDOMEN: Soft, non-tender, non-distended, No pulsatile mass, MUSCULOSKELETAL: No deformity  SKIN: Warm and dry NEUROLOGIC:  Alert and oriented x 3 PSYCHIATRIC:  Normal affect   ASSESSMENT:    1. Chest pain at rest   2. Essential hypertension   3. Morbid obesity ([place])   4. Family history of early CAD   33. Educated about COVID-19 virus infection   6. Precordial pain   7. Nocturia   8. Snoring   9. Daytime sleepiness    PLAN:    In order of problems listed above:  1. The patient will undergo coronary CT with morphology to rule out coronary atherosclerosis as the source of her recurring episodes of chest pain.  She does have a family history, hyperlipidemia, and hypertension. 2. Blood pressure is adequately controlled currently. 3. Mild to moderate obesity noted. 4. Family history as outlined above. 5. The 3W's is identified and practice as a measure to prevent Covid. 6. Nocturia and snoring occur and could represent underlying sleep apnea as one of her cardiac stress sores.  We will rule out sleep apnea by getting a sleep study done.  She also will have a coronary CT with morphology to exclude obstructive coronary disease as source of her recurring episodes of chest pain and multiple emergency room visits.  Medication Adjustments/Labs and Tests Ordered: Current medicines are reviewed at length with the patient today.  Concerns regarding medicines are outlined above.  Orders Placed This Encounter  Procedures  . CT CORONARY MORPH W/CTA COR W/SCORE W/CA W/CM &/OR WO/CM  . CT CORONARY FRACTIONAL FLOW RESERVE DATA PREP  . CT CORONARY FRACTIONAL FLOW RESERVE FLUID ANALYSIS  . Basic metabolic panel  . Split night study   Meds ordered this encounter  Medications  . metoprolol  tartrate (LOPRESSOR) 100 MG tablet    Sig: Take one tablet by mouth 2 hours prior to your CT    Dispense:  1 tablet    Refill:  0    Patient Instructions  Medication Instructions:  Your physician recommends that you continue on your current medications as directed. Please refer to the Current Medication list given to you today.  *If you need a refill on your cardiac medications before your next appointment, please call your pharmacy*  Lab Work: None If you have labs (blood work) drawn today and your tests are completely normal, you will receive your results only by: [name] MyChart Message (if you have MyChart) OR . A paper copy in the mail If you have any lab test that is abnormal or we need to change your treatment, we will call you to review the results.  Testing/Procedures: Your physician recommends that you have a Coronary CT performed.   Your physician has recommended that you have a sleep study. This test records several body functions during sleep, including: brain activity, eye movement, oxygen and carbon dioxide blood levels, heart rate and rhythm, breathing rate and rhythm, the flow of air through your mouth and nose, snoring, body muscle movements, and chest and belly movement.   Follow-Up: At [facility], you and your health  needs are our priority.  As part of our continuing mission to provide you with exceptional heart care, we have created designated Provider Care Teams.  These Care Teams include your primary Cardiologist (physician) and Advanced Practice Providers (APPs -  Physician Assistants and Nurse Practitioners) who all work together to provide you with the care you need, when you need it.  Your next appointment:   as needed  The format for your next appointment:   In Person  Provider:   You may see Dr. Daneen Schick or one of the following Advanced Practice Providers on your designated Care Team:    Truitt Merle, NP  Cecilie Kicks, NP  Kathyrn Drown, NP    Other Instructions  Your cardiac CT will be scheduled at one of the below locations:   [facility] [address] [place], [place] [address] [phone]  [place] [address] [place], [place] [address] [phone]  If scheduled at [facility], please arrive at the [facility] main entrance of [facility] 30-45 minutes prior to test start time. Proceed to the [facility] Radiology Department (first floor) to check-in and test prep.  If scheduled at [facility], please arrive 15 mins early for check-in and test prep.  Please follow these instructions carefully (unless otherwise directed):  On the Night Before the Test: . Be sure to Drink plenty of water. . Do not consume any caffeinated/decaffeinated beverages or chocolate 12 hours prior to your test. . Do not take any antihistamines 12 hours prior to your test.  On the Day of the Test: . Drink plenty of water. Do not drink any water within one hour of the test. . Do not eat any food 4 hours prior to the test. . You may take your regular medications prior to the test.  . Take metoprolol (Lopressor) two hours prior to test. . HOLD Furosemide/Hydrochlorothiazide morning of the test. . FEMALES- please wear underwire-free bra if available       After the Test: . Drink plenty of water. . After receiving IV contrast, you may experience a mild flushed feeling. This is normal. . On occasion, you may experience a mild rash up to 24 hours after the test. This is not dangerous. If this occurs, you can take Benadryl 25 mg and increase your fluid intake. . If you experience trouble breathing, this can be serious. If it is severe call 911 IMMEDIATELY. If it is mild, please call our office. . If you take any of these medications: Glipizide/Metformin, Avandament, Glucavance, please do not take 48 hours after completing test unless  otherwise instructed.   Once we have confirmed authorization from your insurance company, we will call you to set up a date and time for your test.   For non-scheduling related questions, please contact the cardiac imaging nurse navigator should you have any questions/concerns: Marchia Bond, RN Navigator Cardiac Imaging [facility] Vascular Services [phone] Office        Signed, Sinclair Grooms, MD  07/25/2019 11:42 AM    [place]

## 2019-07-25 ENCOUNTER — Telehealth: Payer: Self-pay | Admitting: *Deleted

## 2019-07-25 ENCOUNTER — Encounter: Payer: Self-pay | Admitting: Interventional Cardiology

## 2019-07-25 ENCOUNTER — Other Ambulatory Visit: Payer: Self-pay

## 2019-07-25 ENCOUNTER — Ambulatory Visit (INDEPENDENT_AMBULATORY_CARE_PROVIDER_SITE_OTHER): Payer: Medicare Other | Admitting: Interventional Cardiology

## 2019-07-25 VITALS — BP 132/84 | HR 75 | Ht 63.0 in | Wt 171.4 lb

## 2019-07-25 DIAGNOSIS — Z8249 Family history of ischemic heart disease and other diseases of the circulatory system: Secondary | ICD-10-CM

## 2019-07-25 DIAGNOSIS — R400 Somnolence: Secondary | ICD-10-CM

## 2019-07-25 DIAGNOSIS — R079 Chest pain, unspecified: Secondary | ICD-10-CM | POA: Diagnosis not present

## 2019-07-25 DIAGNOSIS — I10 Essential (primary) hypertension: Secondary | ICD-10-CM | POA: Diagnosis not present

## 2019-07-25 DIAGNOSIS — R072 Precordial pain: Secondary | ICD-10-CM

## 2019-07-25 DIAGNOSIS — Z7189 Other specified counseling: Secondary | ICD-10-CM

## 2019-07-25 DIAGNOSIS — R0683 Snoring: Secondary | ICD-10-CM

## 2019-07-25 DIAGNOSIS — R351 Nocturia: Secondary | ICD-10-CM

## 2019-07-25 MED ORDER — METOPROLOL TARTRATE 100 MG PO TABS: ORAL_TABLET | ORAL | 0 refills | Status: DC

## 2019-07-25 NOTE — Patient Instructions (Addendum)
Medication Instructions:  Your physician recommends that you continue on your current medications as directed. Please refer to the Current Medication list given to you today.  *If you need a refill on your cardiac medications before your next appointment, please call your pharmacy*  Lab Work: None If you have labs (blood work) drawn today and your tests are completely normal, you will receive your results only by: [name] MyChart Message (if you have MyChart) OR . A paper copy in the mail If you have any lab test that is abnormal or we need to change your treatment, we will call you to review the results.  Testing/Procedures: Your physician recommends that you have a Coronary CT performed.   Your physician has recommended that you have a sleep study. This test records several body functions during sleep, including: brain activity, eye movement, oxygen and carbon dioxide blood levels, heart rate and rhythm, breathing rate and rhythm, the flow of air through your mouth and nose, snoring, body muscle movements, and chest and belly movement.   Follow-Up: At [facility], you and your health needs are our priority.  As part of our continuing mission to provide you with exceptional heart care, we have created designated Provider Care Teams.  These Care Teams include your primary Cardiologist (physician) and Advanced Practice Providers (APPs -  Physician Assistants and Nurse Practitioners) who all work together to provide you with the care you need, when you need it.  Your next appointment:   as needed  The format for your next appointment:   In Person  Provider:   You may see Dr. Daneen Schick or one of the following Advanced Practice Providers on your designated Care Team:    Truitt Merle, NP  Cecilie Kicks, NP  Kathyrn Drown, NP   Other Instructions  Your cardiac CT will be scheduled at one of the below locations:   [facility] [address] [place], [place]  [address] [phone]  [place] [address] [place], [place] [address] [phone]  If scheduled at [facility], please arrive at the [facility] main entrance of [facility] 30-45 minutes prior to test start time. Proceed to the [facility] Radiology Department (first floor) to check-in and test prep.  If scheduled at [facility], please arrive 15 mins early for check-in and test prep.  Please follow these instructions carefully (unless otherwise directed):  On the Night Before the Test: . Be sure to Drink plenty of water. . Do not consume any caffeinated/decaffeinated beverages or chocolate 12 hours prior to your test. . Do not take any antihistamines 12 hours prior to your test.  On the Day of the Test: . Drink plenty of water. Do not drink any water within one hour of the test. . Do not eat any food 4 hours prior to the test. . You may take your regular medications prior to the test.  . Take metoprolol (Lopressor) two hours prior to test. . HOLD Furosemide/Hydrochlorothiazide morning of the test. . FEMALES- please wear underwire-free bra if available       After the Test: . Drink plenty of water. . After receiving IV contrast, you may experience a mild flushed feeling. This is normal. . On occasion, you may experience a mild rash up to 24 hours after the test. This is not dangerous. If this occurs, you can take Benadryl 25 mg and increase your fluid intake. . If  you experience trouble breathing, this can be serious. If it is severe call 911 IMMEDIATELY. If it is mild, please call our office. . If you take any of these medications: Glipizide/Metformin, Avandament, Glucavance, please do not take 48 hours after completing test unless otherwise instructed.   Once we have confirmed authorization from your insurance company, we will call you to set up a date and time for your test.    For non-scheduling related questions, please contact the cardiac imaging nurse navigator should you have any questions/concerns: Marchia Bond, RN Navigator Cardiac Imaging [facility] [phone] Office

## 2019-07-25 NOTE — Telephone Encounter (Signed)
-----   Message from Loren Racer, RN sent at 07/25/2019 11:41 AM EST ----- Sleep study ordered for pt

## 2019-07-26 ENCOUNTER — Telehealth: Payer: Self-pay | Admitting: *Deleted

## 2019-07-26 NOTE — Telephone Encounter (Signed)
Staff message sent to Gae Bon ok to schedule sleep study. Per [facility] web portal no PA is required. Decision [id number].

## 2019-07-30 NOTE — Telephone Encounter (Signed)
Patient is scheduled for lab study on 08/14/19. Tanya Walsh is scheduled for COVID screening on 11/28 11:35 prior sleep study.  Patient understands her sleep study will be done at [facility] sleep lab. Patient understands she will receive a sleep packet in a week or so. Patient understands to call if she does not receive the sleep packet in a timely manner.  Left detailed message on voicemail with date and time of titration and informed patient to call back to confirm or reschedule.

## 2019-08-11 ENCOUNTER — Other Ambulatory Visit (HOSPITAL_COMMUNITY)
Admission: RE | Admit: 2019-08-11 | Discharge: 2019-08-11 | Disposition: A | Discharge status: Home / Self Care | Payer: Medicare Other | Source: Ambulatory Visit | Attending: Cardiology | Admitting: Cardiology

## 2019-08-11 DIAGNOSIS — Z20828 Contact with and (suspected) exposure to other viral communicable diseases: Secondary | ICD-10-CM | POA: Diagnosis not present

## 2019-08-11 DIAGNOSIS — Z01812 Encounter for preprocedural laboratory examination: Secondary | ICD-10-CM | POA: Diagnosis present

## 2019-08-12 LAB — NOVEL CORONAVIRUS, NAA (HOSP ORDER, SEND-OUT TO REF LAB; TAT 18-24 HRS): SARS-CoV-2, NAA: NOT DETECTED

## 2019-08-13 ENCOUNTER — Telehealth: Payer: Self-pay

## 2019-08-13 NOTE — Telephone Encounter (Signed)
The patient has been notified of the result and verbalized understanding.  All questions (if any) were answered. Wilma Flavin, RN 08/13/2019 8:08 AM

## 2019-08-13 NOTE — Telephone Encounter (Signed)
-----   Message from Sueanne Margarita, MD sent at 08/12/2019  5:54 PM EST ----- Please let patient know that labs were normal.  Continue current medical therapy.

## 2019-08-14 ENCOUNTER — Encounter (HOSPITAL_BASED_OUTPATIENT_CLINIC_OR_DEPARTMENT_OTHER): Payer: Self-pay | Admitting: Cardiology

## 2019-08-14 ENCOUNTER — Other Ambulatory Visit: Payer: Self-pay

## 2019-08-14 ENCOUNTER — Ambulatory Visit (HOSPITAL_BASED_OUTPATIENT_CLINIC_OR_DEPARTMENT_OTHER): Payer: Medicare Other | Attending: Interventional Cardiology | Admitting: Cardiology

## 2019-08-14 DIAGNOSIS — G4733 Obstructive sleep apnea (adult) (pediatric): Secondary | ICD-10-CM

## 2019-08-14 DIAGNOSIS — I10 Essential (primary) hypertension: Secondary | ICD-10-CM | POA: Insufficient documentation

## 2019-08-14 DIAGNOSIS — R351 Nocturia: Secondary | ICD-10-CM | POA: Diagnosis not present

## 2019-08-14 DIAGNOSIS — R0683 Snoring: Secondary | ICD-10-CM

## 2019-08-14 DIAGNOSIS — R400 Somnolence: Secondary | ICD-10-CM | POA: Diagnosis not present

## 2019-08-14 DIAGNOSIS — I493 Ventricular premature depolarization: Secondary | ICD-10-CM | POA: Insufficient documentation

## 2019-08-14 HISTORY — DX: Nocturia: R35.1

## 2019-08-16 NOTE — Procedures (Signed)
   Patient Name: Tanya Walsh, Tanya Walsh Date: 08/14/2019 Gender: Female D.O.B: 1956-04-15 Age (years): 56 Referring Provider: Daneen Schick Height (inches): 19 Interpreting Physician: Fransico Him MD, ABSM Weight (lbs): 171 RPSGT: Carolin Coy BMI: 29 MRN: [id number] Neck Size: 13.50  CLINICAL INFORMATION Sleep Study Type: NPSG  Indication for sleep study: Fatigue, Hypertension, Morning Headaches, Snoring  Epworth Sleepiness Score: 12  SLEEP STUDY TECHNIQUE As per the AASM Manual for the Scoring of Sleep and Associated Events v2.3 (April 2016) with a hypopnea requiring 4% desaturations.  The channels recorded and monitored were frontal, central and occipital EEG, electrooculogram (EOG), submentalis EMG (chin), nasal and oral airflow, thoracic and abdominal wall motion, anterior tibialis EMG, snore microphone, electrocardiogram, and pulse oximetry.  MEDICATIONS Medications self-administered by patient taken the night of the study : N/A  SLEEP ARCHITECTURE The study was initiated at 9:56:45 PM and ended at 4:54:17 AM.  Sleep onset time was 14.4 minutes and the sleep efficiency was 71.4%. The total sleep time was 298 minutes.  Stage REM latency was 73.5 minutes.  The patient spent 14.8% of the night in stage N1 sleep, 54.0% in stage N2 sleep, 0.0% in stage N3 and 31.2% in REM.  Alpha intrusion was absent.  Supine sleep was 45.90%.  RESPIRATORY PARAMETERS The overall apnea/hypopnea index (AHI) was 7.7 per hour. There were 1 total apneas, including 0 obstructive, 1 central and 0 mixed apneas. There were 37 hypopneas and 58 RERAs.  The AHI during Stage REM sleep was 11.0 per hour.  AHI while supine was 8.8 per hour.  The mean oxygen saturation was 91.9%. The minimum SpO2 during sleep was 88.0%.  soft snoring was noted during this study.  CARDIAC DATA The 2 lead EKG demonstrated sinus rhythm. The mean heart rate was 56.9 beats per minute. Other EKG findings include:  PVCs.  LEG MOVEMENT DATA The total PLMS were 0 with a resulting PLMS index of 0.0. Associated arousal with leg movement index was 0.0 .  IMPRESSIONS - Mild obstructive sleep apnea occurred during this study (AHI = 7.7/h). - No significant central sleep apnea occurred during this study (CAI = 0.2/h). - The patient had minimal or no oxygen desaturation during the study (Min O2 = 88.0%) - The patient snored with soft snoring volume. - EKG findings include PVCs. - Clinically significant periodic limb movements did not occur during sleep. No significant associated arousals.  DIAGNOSIS - Obstructive Sleep Apnea (327.23 [G47.33 ICD-10])  RECOMMENDATIONS - Very mild obstructive sleep apnea. Return to discuss treatment options. - Avoid alcohol, sedatives and other CNS depressants that may worsen sleep apnea and disrupt normal sleep architecture. - Sleep hygiene should be reviewed to assess factors that may improve sleep quality. - Weight management and regular exercise should be initiated or continued if appropriate.

## 2019-08-17 ENCOUNTER — Telehealth: Payer: Self-pay | Admitting: *Deleted

## 2019-08-17 NOTE — Telephone Encounter (Signed)
Informed patient of sleep study results and patient understanding was verbalized. Patient understands her sleep study showed that she has very mild OSA> Please set up virtual visit at next available to discuss treatment options. Appointment set for 10/03/19 10:00.

## 2019-08-17 NOTE — Telephone Encounter (Signed)
-----   Message from Tanya Margarita, MD sent at 08/16/2019 12:21 PM EST ----- Please let patient know that she has very mild OSA>  Please set up virtual visit at next available to discuss treatment options

## 2019-08-21 NOTE — Telephone Encounter (Signed)

## 2019-08-27 ENCOUNTER — Telehealth: Payer: Self-pay | Admitting: *Deleted

## 2019-08-27 NOTE — Telephone Encounter (Signed)
-----   Message from Sueanne Margarita, MD sent at 08/16/2019 12:21 PM EST ----- Please let patient know that she has very mild OSA>  Please set up virtual visit at next available to discuss treatment options

## 2019-08-27 NOTE — Telephone Encounter (Signed)
Informed patient of sleep study results and patient understanding was verbalized. Patient understands her sleep study showed that she has very mild OSA> Please set up virtual visit at next available to discuss treatment options.  Patient has an appointment scheduled for 10/03/19.  Called patient and confirmed appt with her. Patient was thankful for the call.

## 2019-09-03 ENCOUNTER — Other Ambulatory Visit: Payer: Medicare Other | Admitting: *Deleted

## 2019-09-03 ENCOUNTER — Other Ambulatory Visit: Payer: Self-pay

## 2019-09-03 DIAGNOSIS — R072 Precordial pain: Secondary | ICD-10-CM

## 2019-09-03 DIAGNOSIS — R079 Chest pain, unspecified: Secondary | ICD-10-CM

## 2019-09-03 DIAGNOSIS — I10 Essential (primary) hypertension: Secondary | ICD-10-CM

## 2019-09-04 LAB — BASIC METABOLIC PANEL WITH GFR
BUN/Creatinine Ratio: 14 (ref 12–28)
BUN: 12 mg/dL (ref 8–27)
CO2: 25 mmol/L (ref 20–29)
Calcium: 9.6 mg/dL (ref 8.7–10.3)
Chloride: 103 mmol/L (ref 96–106)
Creatinine, Ser: 0.88 mg/dL (ref 0.57–1.00)
GFR calc Af Amer: 80 mL/min/1.73 (ref >59)
GFR calc non Af Amer: 69 mL/min/1.73 (ref >59)
Glucose: 105 mg/dL — ABNORMAL HIGH (ref 65–99)
Potassium: 3.7 mmol/L (ref 3.5–5.2)
Sodium: 141 mmol/L (ref 134–144)

## 2019-09-26 ENCOUNTER — Encounter (HOSPITAL_COMMUNITY): Payer: Self-pay

## 2019-09-27 ENCOUNTER — Telehealth (HOSPITAL_COMMUNITY): Payer: Self-pay | Admitting: Emergency Medicine

## 2019-09-27 NOTE — Telephone Encounter (Signed)
Returning phone call after patient left VM on my mobile. Unfortunately patient did not answer. Another VM left on her phone.  Marchia Bond RN Navigator Cardiac Imaging [facility] Heart and Vascular Services [phone] Office  [phone] Cell

## 2019-09-27 NOTE — Telephone Encounter (Signed)
Left message on voicemail with name and callback number Kasee Hantz RN Navigator Cardiac Imaging [place] [facility] [phone] Office [phone] Cell  

## 2019-09-28 ENCOUNTER — Ambulatory Visit (HOSPITAL_COMMUNITY)
Admission: RE | Admit: 2019-09-28 | Discharge: 2019-09-28 | Disposition: A | Discharge status: Home / Self Care | Payer: Medicare Other | Source: Ambulatory Visit | Attending: Interventional Cardiology | Admitting: Interventional Cardiology

## 2019-09-28 ENCOUNTER — Other Ambulatory Visit: Payer: Self-pay

## 2019-09-28 DIAGNOSIS — R072 Precordial pain: Secondary | ICD-10-CM | POA: Diagnosis not present

## 2019-09-28 DIAGNOSIS — I2510 Atherosclerotic heart disease of native coronary artery without angina pectoris: Secondary | ICD-10-CM | POA: Diagnosis not present

## 2019-09-28 MED ORDER — NITROGLYCERIN 0.4 MG SL SUBL
0.8000 mg | SUBLINGUAL_TABLET | Freq: Once | SUBLINGUAL | Status: AC
  Administered 2019-09-28: 0.8 mg via SUBLINGUAL

## 2019-09-28 MED ORDER — IOHEXOL 350 MG/ML SOLN
80.0000 mL | Freq: Once | INTRAVENOUS | Status: AC | PRN
  Administered 2019-09-28: 12:00:00 80 mL via INTRAVENOUS

## 2019-09-28 MED ORDER — NITROGLYCERIN 0.4 MG SL SUBL
SUBLINGUAL_TABLET | SUBLINGUAL | Status: AC
  Filled 2019-09-28: qty 2

## 2019-09-29 DIAGNOSIS — R072 Precordial pain: Secondary | ICD-10-CM | POA: Diagnosis not present

## 2019-10-01 ENCOUNTER — Telehealth: Payer: Self-pay | Admitting: *Deleted

## 2019-10-01 ENCOUNTER — Other Ambulatory Visit: Payer: Self-pay | Admitting: Interventional Cardiology

## 2019-10-01 NOTE — Telephone Encounter (Signed)
New message  Patient is returning your call for ct test results. Please call

## 2019-10-01 NOTE — Telephone Encounter (Signed)
Informed pt of results. Pt verbalized understanding. 

## 2019-10-02 NOTE — Progress Notes (Signed)
Virtual Visit via Video Note   This visit type was conducted due to national recommendations for restrictions regarding the COVID-19 Pandemic (e.g. social distancing) in an effort to limit this patient's exposure and mitigate transmission in our community.  Due to her co-morbid illnesses, this patient is at least at moderate risk for complications without adequate follow up.  This format is felt to be most appropriate for this patient at this time.  All issues noted in this document were discussed and addressed.  A limited physical exam was performed with this format.  Please refer to the patient's chart for her consent to telehealth for [facility].   Evaluation Performed:  Follow-up visit  This visit type was conducted due to national recommendations for restrictions regarding the COVID-19 Pandemic (e.g. social distancing).  This format is felt to be most appropriate for this patient at this time.  All issues noted in this document were discussed and addressed.  No physical exam was performed (except for noted visual exam findings with Video Visits).  Please refer to the patient's chart (MyChart message for video visits and phone note for telephone visits) for the patient's consent to telehealth for [facility].  Date:  10/03/2019   ID:  Tanya Walsh, Tanya Walsh 02/01/1956, MRN [id number]  Patient Location:  Home  Provider location:   [place]  PCP:  Katherina Mires, MD  Cardiologist: Daneen Schick, MD Sleep Medicine:  Fransico Him, MD Electrophysiologist:  None   Chief Complaint:  OSA  History of Present Illness:    Tanya Walsh is a 64 y.o. female who presents via audio/video conferencing for a telehealth visit today.    This is a 65yo female with a hx of HTN, prediabetes was recently seen by Dr. Tamala Julian and complained of nocturia and snoring and due to concern for OSA a sleep study was ordered.  She tells me that she wakes up feeling very fatigued and has to nap during the  day due to daytime sleepiness.  She underwent sleep study showing mild OSA with an AHI of 7.7/hr and no central sleep apnea.  There was soft snoring.  She is now here to discuss results and determine a plan of treatment.    The patient does not have symptoms concerning for COVID-19 infection (fever, chills, cough, or new shortness of breath).   Prior CV studies:   The following studies were reviewed today:  Sleep study  Past Medical History:  Diagnosis Date  . Coronary artery disease   . Depression   . GI bleed 07/17/2015  . Hypertension   . Myocardial infarction ([place])   . Nocturia 08/14/2019  . Refusal of blood transfusions as patient is Jehovah's Witness    Past Surgical History:  Procedure Laterality Date  . ABDOMINAL HYSTERECTOMY    . BACK SURGERY    . ESOPHAGOGASTRODUODENOSCOPY N/A 07/17/2015   Procedure: ESOPHAGOGASTRODUODENOSCOPY (EGD);  Surgeon: Wonda Horner, MD;  Location: [facility] ENDOSCOPY;  Service: Endoscopy;  Laterality: N/A;     Current Meds  Medication Sig  . losartan-hydrochlorothiazide (HYZAAR) 50-12.5 MG tablet Take 1 tablet by mouth daily.  . nitroGLYCERIN (NITROSTAT) 0.4 MG SL tablet Place 1 tablet (0.4 mg total) under the tongue every 5 (five) minutes as needed for chest pain.  . pantoprazole (PROTONIX) 40 MG tablet Take 1 tablet (40 mg total) by mouth 2 (two) times daily. (Patient taking differently: Take 40 mg by mouth 2 (two) times daily as needed. )  . sertraline (ZOLOFT) 50  MG tablet Take 50 mg by mouth daily.     Allergies:   Patient has no known allergies.   Social History   Tobacco Use  . Smoking status: Never Smoker  . Smokeless tobacco: Never Used  Substance Use Topics  . Alcohol use: Yes    Comment: rare  . Drug use: No     Family Hx: The patient's family history includes Heart disease in her brother and father.  ROS:   Please see the history of present illness.     All other systems reviewed and are negative.   Labs/Other Tests and  Data Reviewed:    Recent Labs: 05/08/2019: Hemoglobin 14.3; Platelets 330 09/03/2019: BUN 12; Creatinine, Ser 0.88; Potassium 3.7; Sodium 141   Recent Lipid Panel Lab Results  Component Value Date/Time   CHOL  07/08/2009 05:30 AM    199        ATP III CLASSIFICATION:  <200     mg/dL   Desirable  200-239  mg/dL   Borderline High  >=240    mg/dL   High          TRIG 71 07/08/2009 05:30 AM   HDL 46 07/08/2009 05:30 AM   CHOLHDL 4.3 07/08/2009 05:30 AM   LDLCALC (H) 07/08/2009 05:30 AM    139        Total Cholesterol/HDL:CHD Risk Coronary Heart Disease Risk Table                     Men   Women  1/2 Average Risk   3.4   3.3  Average Risk       5.0   4.4  2 X Average Risk   9.6   7.1  3 X Average Risk  23.4   11.0        Use the calculated Patient Ratio above and the CHD Risk Table to determine the patient's CHD Risk.        ATP III CLASSIFICATION (LDL):  <100     mg/dL   Optimal  100-129  mg/dL   Near or Above                    Optimal  130-159  mg/dL   Borderline  160-189  mg/dL   High  >190     mg/dL   Very High    Wt Readings from Last 3 Encounters:  10/03/19 174 lb (78.9 kg)  08/14/19 171 lb (77.6 kg)  07/25/19 171 lb 6.4 oz (77.7 kg)     Objective:    Vital Signs:  Ht 5\' 4"  (1.626 m)   Wt 174 lb (78.9 kg)   BMI 29.87 kg/m    ASSESSMENT & PLAN:    1.  OSA -mild OSA by recent sleep study with AHI 7.7/hr and during REM sleep 11/hr -we discussed the treatment options for OSA in general including PAP therapy, oral device, weight loss and good sleep hygiene. -recommend proceeding with CPAP therapy given her sx of excessive daytime sleepiness -will order a Resmed CPAP on auto from 4 to 18cm H2O with heated humidity and mask of choice -we discussed   2.  HTN -continue Losartan-12.5mg  daily  3.  Excessive daytime sleepiness -likely related to OSA  COVID-19 Education: The signs and symptoms of COVID-19 were discussed with the patient and how to seek care  for testing (follow up with PCP or arrange E-visit).  The importance of social distancing was discussed today.  Patient Risk:   After full review of this patient's clinical status, I feel that they are at least moderate risk at this time.  Time:   Today, I have spent 15 minutes directly with the patient on telemedicine discussing medical problems including OSA.  We also reviewed the symptoms of COVID 19 and the ways to protect against contracting the virus with telehealth technology.  I spent an additional 5 minutes reviewing patient's chart including sleep study.  Medication Adjustments/Labs and Tests Ordered: Current medicines are reviewed at length with the patient today.  Concerns regarding medicines are outlined above.  Tests Ordered: No orders of the defined types were placed in this encounter.  Medication Changes: No orders of the defined types were placed in this encounter.   Disposition:  Follow up in 10 week(s)  Signed, Fransico Him, MD  10/03/2019 10:00 AM    [facility]

## 2019-10-03 ENCOUNTER — Telehealth (INDEPENDENT_AMBULATORY_CARE_PROVIDER_SITE_OTHER): Payer: Medicare Other | Admitting: Cardiology

## 2019-10-03 ENCOUNTER — Other Ambulatory Visit: Payer: Self-pay

## 2019-10-03 ENCOUNTER — Telehealth: Payer: Self-pay | Admitting: *Deleted

## 2019-10-03 ENCOUNTER — Encounter: Payer: Self-pay | Admitting: Cardiology

## 2019-10-03 VITALS — Ht 64.0 in | Wt 174.0 lb

## 2019-10-03 DIAGNOSIS — G4733 Obstructive sleep apnea (adult) (pediatric): Secondary | ICD-10-CM

## 2019-10-03 DIAGNOSIS — I10 Essential (primary) hypertension: Secondary | ICD-10-CM | POA: Diagnosis not present

## 2019-10-03 DIAGNOSIS — G4719 Other hypersomnia: Secondary | ICD-10-CM

## 2019-10-03 NOTE — Telephone Encounter (Signed)
Order placed to [place] via community message.

## 2019-10-03 NOTE — Telephone Encounter (Signed)
-----   Message from Sueanne Margarita, MD sent at 10/03/2019 10:00 AM EST ----- Order ResMed CPAP on auto from 4 to 18cm H2o with heated humidity and mask of choice and will need followup with me 10 weeks after getting her device

## 2019-10-26 ENCOUNTER — Telehealth: Payer: Self-pay | Admitting: *Deleted

## 2019-10-26 DIAGNOSIS — I2510 Atherosclerotic heart disease of native coronary artery without angina pectoris: Secondary | ICD-10-CM

## 2019-10-26 NOTE — Telephone Encounter (Signed)
Left message to call office

## 2019-10-26 NOTE — Telephone Encounter (Signed)
-----   Message from Belva Crome, MD sent at 10/25/2019  5:28 PM EST ----- Let the patient know the CT scan demonstrates moderate plaque buildup in atrial for 3 arteries.  Blood flow measurements do not suggest severe blockage.  Risk factor modification needs to be instituted with the first order of business getting LDL cholesterol less than 70.  Start rosuvastatin 20 mg/day.  Check liver and lipid panel in 6 weeks.  Let me know if any concerns.  The reason for statin therapy is to prevent progression of plaque in the coronaries and to hopefully decrease the risk of a heart attack over time. A copy will be sent to Katherina Mires, MD

## 2019-10-26 NOTE — Telephone Encounter (Signed)
I placed call to patient and received message call could not be completed as dialed

## 2019-11-07 NOTE — Telephone Encounter (Signed)
Left message to call back  

## 2019-11-08 MED ORDER — ROSUVASTATIN CALCIUM 20 MG PO TABS: 20.0000 mg | ORAL_TABLET | Freq: Every day | ORAL | 3 refills | Status: DC

## 2019-11-08 NOTE — Telephone Encounter (Signed)
Spoke with pt and went over results and recommendations.  Pt will have labs drawn on 12/25/19.  Pt verbalized understanding and was appreciative for call.

## 2019-12-25 ENCOUNTER — Emergency Department (HOSPITAL_COMMUNITY)
Admission: EM | Admit: 2019-12-25 | Discharge: 2019-12-25 | Disposition: A | Discharge status: Home / Self Care | Payer: Medicare Other | Attending: Emergency Medicine | Admitting: Emergency Medicine

## 2019-12-25 ENCOUNTER — Other Ambulatory Visit: Payer: Self-pay

## 2019-12-25 ENCOUNTER — Other Ambulatory Visit: Payer: Medicare Other

## 2019-12-25 ENCOUNTER — Encounter (HOSPITAL_COMMUNITY): Payer: Self-pay

## 2019-12-25 ENCOUNTER — Emergency Department (HOSPITAL_COMMUNITY): Payer: Medicare Other

## 2019-12-25 DIAGNOSIS — R079 Chest pain, unspecified: Secondary | ICD-10-CM

## 2019-12-25 DIAGNOSIS — M542 Cervicalgia: Secondary | ICD-10-CM | POA: Insufficient documentation

## 2019-12-25 DIAGNOSIS — I10 Essential (primary) hypertension: Secondary | ICD-10-CM | POA: Insufficient documentation

## 2019-12-25 DIAGNOSIS — I252 Old myocardial infarction: Secondary | ICD-10-CM | POA: Insufficient documentation

## 2019-12-25 DIAGNOSIS — R200 Anesthesia of skin: Secondary | ICD-10-CM | POA: Diagnosis not present

## 2019-12-25 DIAGNOSIS — I2510 Atherosclerotic heart disease of native coronary artery without angina pectoris: Secondary | ICD-10-CM | POA: Insufficient documentation

## 2019-12-25 DIAGNOSIS — Z79899 Other long term (current) drug therapy: Secondary | ICD-10-CM | POA: Insufficient documentation

## 2019-12-25 DIAGNOSIS — R0789 Other chest pain: Secondary | ICD-10-CM | POA: Diagnosis not present

## 2019-12-25 DIAGNOSIS — R202 Paresthesia of skin: Secondary | ICD-10-CM

## 2019-12-25 LAB — CBC
HCT: 41.6 % (ref 36.0–46.0)
Hemoglobin: 13.1 g/dL (ref 12.0–15.0)
MCH: 28.8 pg (ref 26.0–34.0)
MCHC: 31.5 g/dL (ref 30.0–36.0)
MCV: 91.4 fL (ref 80.0–100.0)
Platelets: 286 10*3/uL (ref 150–400)
RBC: 4.55 MIL/uL (ref 3.87–5.11)
RDW: 12.6 % (ref 11.5–15.5)
WBC: 4.4 10*3/uL (ref 4.0–10.5)
nRBC: 0 % (ref 0.0–0.2)

## 2019-12-25 LAB — BASIC METABOLIC PANEL
Anion gap: 9 (ref 5–15)
BUN: 6 mg/dL — ABNORMAL LOW (ref 8–23)
CO2: 25 mmol/L (ref 22–32)
Calcium: 9.1 mg/dL (ref 8.9–10.3)
Chloride: 109 mmol/L (ref 98–111)
Creatinine, Ser: 0.83 mg/dL (ref 0.44–1.00)
GFR calc Af Amer: >60 mL/min (ref >60)
GFR calc non Af Amer: >60 mL/min (ref >60)
Glucose, Bld: 127 mg/dL — ABNORMAL HIGH (ref 70–99)
Potassium: 3.4 mmol/L — ABNORMAL LOW (ref 3.5–5.1)
Sodium: 143 mmol/L (ref 135–145)

## 2019-12-25 LAB — LIPID PANEL
Chol/HDL Ratio: 2.4 ratio (ref 0.0–4.4)
Cholesterol, Total: 145 mg/dL (ref 100–199)
HDL: 61 mg/dL (ref >39)
LDL Chol Calc (NIH): 71 mg/dL (ref 0–99)
Triglycerides: 62 mg/dL (ref 0–149)
VLDL Cholesterol Cal: 13 mg/dL (ref 5–40)

## 2019-12-25 LAB — TROPONIN I (HIGH SENSITIVITY): Troponin I (High Sensitivity): 6 ng/L (ref <18)

## 2019-12-25 LAB — HEPATIC FUNCTION PANEL
ALT: 11 IU/L (ref 0–32)
AST: 16 IU/L (ref 0–40)
Albumin: 4 g/dL (ref 3.8–4.8)
Alkaline Phosphatase: 63 IU/L (ref 39–117)
Bilirubin Total: 0.3 mg/dL (ref 0.0–1.2)
Bilirubin, Direct: 0.12 mg/dL (ref 0.00–0.40)
Total Protein: 6.6 g/dL (ref 6.0–8.5)

## 2019-12-25 MED ORDER — IBUPROFEN 400 MG PO TABS
600.0000 mg | ORAL_TABLET | Freq: Once | ORAL | Status: AC
  Administered 2019-12-25: 600 mg via ORAL
  Filled 2019-12-25: qty 1

## 2019-12-25 MED ORDER — SODIUM CHLORIDE 0.9% FLUSH: 3.0000 mL | Freq: Once | INTRAVENOUS | Status: DC

## 2019-12-25 NOTE — ED Provider Notes (Signed)
[facility] EMERGENCY DEPARTMENT Provider Note   CSN: [id number] Arrival date & time: 12/25/19  1109     History Chief Complaint  Patient presents with  . Numbness  . Chest Pain  . Neck Pain    Tanya Walsh is a 64 y.o. female presenting to the ED with chest pain, neck and back pain, and left facial numbness.  She reports intermittent symptoms for several days.  Today she was driving and began to experience a pressure sensation on the left side of her head with numbness in the left half of her head.  This was associated with mid-sternal chest pressure that did no radiate, and a pain in her neck as well.  She continues having 5/10 pressure in the ED.   Of note, she has had nearly-identical symptoms several times in the past, and has undergone an extensive outpatient evaluation.  Her cardiologist, Dr Daneen Schick, had ordered CT coronary angiogram in Jan 2021, which showed no significant stenosis of any of the major vasculature.  Dr Tamala Julian in Nov 2020 noted that the patient has had "recurring episodes of chest pain."  She has HTN, HLD, but does not smoke.  She was also seen in our ER on 05/08/19 for an episode of left sided facial numbness and headache, which she says was very similar to what she is experiencing today.  At that time she had MRI of her brain performed with no acute lesion or CVA found.  The patient separately reports to me that she has never been tested for Lyme disease.  She says when she lived in [place] she remembered removing a tick once, but does not recall any unusual rashes at that time.  She wonders if this may be related to her symptoms.  She denies fevers, chills, SOB here. She reports a hx of reflux but her current symptoms "feel different."  HPI     Past Medical History:  Diagnosis Date  . Coronary artery disease   . Depression   . GI bleed 07/17/2015  . Hypertension   . Myocardial infarction ([place])   . Nocturia 08/14/2019  . Refusal  of blood transfusions as patient is Jehovah's Witness     Patient Active Problem List   Diagnosis Date Noted  . Nocturia 08/14/2019  . Melena 07/17/2015  . Incidental lung nodule, less than or equal to 19mm 07/17/2015  . Chest pain at rest 04/16/2014  . Essential hypertension 04/16/2014  . Morbid obesity ([place]) 04/16/2014  . Family history of early CAD 04/16/2014    Past Surgical History:  Procedure Laterality Date  . ABDOMINAL HYSTERECTOMY    . BACK SURGERY    . ESOPHAGOGASTRODUODENOSCOPY N/A 07/17/2015   Procedure: ESOPHAGOGASTRODUODENOSCOPY (EGD);  Surgeon: Wonda Horner, MD;  Location: [facility] ENDOSCOPY;  Service: Endoscopy;  Laterality: N/A;     OB History   No obstetric history on file.     Family History  Problem Relation Age of Onset  . Heart disease Father   . Heart disease Brother     Social History   Tobacco Use  . Smoking status: Never Smoker  . Smokeless tobacco: Never Used  Substance Use Topics  . Alcohol use: Yes    Comment: rare  . Drug use: No    Home Medications Prior to Admission medications   Medication Sig Start Date End Date Taking? Authorizing Provider  losartan-hydrochlorothiazide (HYZAAR) 50-12.5 MG tablet Take 1 tablet by mouth daily. 03/21/18   [provider]  nitroGLYCERIN (NITROSTAT) 0.4 MG SL tablet Place 1 tablet (0.4 mg total) under the tongue every 5 (five) minutes as needed for chest pain. [id number]   Delora Fuel, MD  pantoprazole (PROTONIX) 40 MG tablet Take 1 tablet (40 mg total) by mouth 2 (two) times daily. Patient taking differently: Take 40 mg by mouth 2 (two) times daily as needed.  07/18/15   Shela Leff, MD  rosuvastatin (CRESTOR) 20 MG tablet Take 1 tablet (20 mg total) by mouth daily. 11/08/19 02/06/20  Belva Crome, MD  sertraline (ZOLOFT) 50 MG tablet Take 50 mg by mouth daily. 05/09/19   [provider]    Allergies    Patient has no known allergies.  Review of Systems   Review of Systems   Constitutional: Negative for chills and fever.  HENT: Negative for ear pain and sore throat.   Eyes: Negative for photophobia and visual disturbance.  Respiratory: Negative for cough and shortness of breath.   Cardiovascular: Positive for chest pain. Negative for palpitations and leg swelling.  Gastrointestinal: Negative for abdominal pain and vomiting.  Genitourinary: Negative for dysuria and hematuria.  Musculoskeletal: Positive for arthralgias and myalgias.  Skin: Negative for color change and rash.  Neurological: Positive for numbness and headaches. Negative for syncope.  Psychiatric/Behavioral: Negative for agitation and confusion.  All other systems reviewed and are negative.   Physical Exam Updated Vital Signs BP (!) 154/87   Pulse 79   Temp 98.1 F (36.7 C)   Resp 16   SpO2 100%   Physical Exam Vitals and nursing note reviewed.  Constitutional:      General: She is not in acute distress.    Appearance: She is well-developed.  HENT:     Head: Normocephalic and atraumatic.  Eyes:     Conjunctiva/sclera: Conjunctivae normal.  Cardiovascular:     Rate and Rhythm: Normal rate and regular rhythm.     Heart sounds: No murmur.  Pulmonary:     Effort: Pulmonary effort is normal. No respiratory distress.     Breath sounds: Normal breath sounds.  Abdominal:     Palpations: Abdomen is soft.     Tenderness: There is no abdominal tenderness.  Musculoskeletal:     Cervical back: Neck supple.     Comments: Kyphosis of the upper spine Deltoid muscle tenderness on exam  Skin:    General: Skin is warm and dry.  Neurological:     General: No focal deficit present.     Mental Status: She is alert and oriented to person, place, and time.     GCS: GCS eye subscore is 4. GCS verbal subscore is 5. GCS motor subscore is 6.     Cranial Nerves: Cranial nerves are intact.     Sensory: Sensation is intact.     Motor: Motor function is intact.     Coordination: Coordination is intact.      Gait: Gait is intact.     Comments: Gross and fine sensation preserved in left face     ED Results / Procedures / Treatments   Labs (all labs ordered are listed, but only abnormal results are displayed) Labs Reviewed  BASIC METABOLIC PANEL - Abnormal; Notable for the following components:      Result Value   Potassium 3.4 (*)    Glucose, Bld 127 (*)    BUN 6 (*)    All other components within normal limits  CBC  B. BURGDORFI ANTIBODIES  TROPONIN I (HIGH SENSITIVITY)  EKG EKG Interpretation  Date/Time:  Tuesday December 25 2019 11:39:09 EDT Ventricular Rate:  62 PR Interval:  160 QRS Duration: 78 QT Interval:  390 QTC Calculation: 395 R Axis:   20 Text Interpretation: Normal sinus rhythm No significant change from Aug 2020 ecg, No STEMI Confirmed by Octaviano Glow [id number]) on 12/25/2019 1:22:30 PM   Radiology DG Chest 2 View  Result Date: 12/25/2019 CLINICAL DATA:  Chest pain and left shoulder pain. EXAM: CHEST - 2 VIEW COMPARISON:  03/09/2019 FINDINGS: Lungs are adequately inflated without focal airspace consolidation or effusion. Cardiomediastinal silhouette and remainder the exam is unchanged. IMPRESSION: No active cardiopulmonary disease. Electronically Signed   By: Marin Olp M.D.   On: 12/25/2019 12:09    Procedures Procedures (including critical care time)  Medications Ordered in ED Medications  sodium chloride flush (NS) 0.9 % injection 3 mL (3 mLs Intravenous Not Given 12/25/19 1419)  ibuprofen (ADVIL) tablet 600 mg (600 mg Oral Given 12/25/19 1419)    ED Course  I have reviewed the triage vital signs and the nursing notes.  Pertinent labs & imaging results that were available during my care of the patient were reviewed by me and considered in my medical decision making (see chart for details).  64 yo female here with chest pain, upper neck pain and left sided facial numbness.  She has had these symptoms in the past, and was seen in our ED within the  past 6 months for her facial numbness with negative MRI of her brain.  She also follows with Dr Tamala Julian of cardiology and had a benign CT coronary angiogram 3 months ago on 10/08/19.   Based on this prior workup, I am less suspicious of stroke or ACS at this time. This is not likely a PE without respiratory symptoms, hypoxia, or tachycardia No evidence of PTX or infeciton on her xray  I discussed the possibility of reflux vs cervical angina, given her kyphosis, neck pain, and poor posture.  On further questioning, she does feel like her pain may originate in her upper back and spread "deep to her chest." I gave her some motrin here and she reported improvement.  I think cervical angina needs to remain on the differential.  I'm willing to test for lyme with her history, but there's no clear correlation with her current (and intermittent) symptoms.  She does not have a bell's palsy or objective facial sensory/motor deficits on exam.  Highly doubtful this is a stroke.  I personally ordered and reviewed her labs and xray with findings as noted above. I reviewed her medical records as noted above, including prior scans and office visits.  Clinical Course as of Dec 25 1930  Tue Dec 25, 2019  1530 Patient is feeling significantly better after taking a nap and taking her Motrin.  Discussed the possibility this is in fact cervical angiogram.  I advised her to follow-up with her primary care doctor this week.   [MT]    Clinical Course User Index [MT] Shianna Bally, Carola Rhine, MD    Final Clinical Impression(s) / ED Diagnoses Final diagnoses:  Chest pain, unspecified type  Numbness and tingling of left side of face    Rx / DC Orders ED Discharge Orders    None       Wyvonnia Dusky, MD 12/25/19 1932

## 2019-12-25 NOTE — Discharge Instructions (Addendum)
Received in the emergency part today for an episode of chest pain and back pain.  Also reporting some numbness in the left side of your face.  We discussed the possibility of diagnosis called "cervical angina," which can mimic chest pain from the heart.  You seemed to improve after treatment with motrin.  For the next 2 days you can take motrin (ibuprofen) 600 mg every 6 hours for pain.  Do NOT take this long term without talking to your primary care doctor.  If your pain is coming from your spinal cord, you may need an MRI of your cervical spine.  We talked about exercises to keep your back and spine straight, to avoid looking down too long, which stresses your upper spine.  If you have worsening pain or symptoms, you can always return to the ER.

## 2019-12-25 NOTE — ED Notes (Signed)
Patient verbalizes understanding of discharge instructions. Opportunity for questioning and answers were provided. Armband removed by staff, pt discharged from ED ambulatory.   

## 2019-12-25 NOTE — ED Triage Notes (Signed)
Pt presents with numbness to Left side of face and Left arm followed by pressure in her head, Left side chest pressure and left posterior shoulder pain x45 mins

## 2019-12-26 LAB — B. BURGDORFI ANTIBODIES: B burgdorferi Ab IgG+IgM: <0.91 {ISR} (ref 0.00–0.90)

## 2019-12-27 NOTE — Progress Notes (Signed)
Cardiology Office Note:    Date:  12/28/2019   ID:  Tanya, Walsh 03/07/1956, MRN [id number]  PCP:  Katherina Mires, MD  Cardiologist:  No primary care provider on file.   Referring MD: Katherina Mires, MD   Chief Complaint  Patient presents with  . Chest Pain  . Advice Only    Intermittent left arm and facial numbness    History of Present Illness:    Tanya Walsh is a 64 y.o. female with a hx of chest pain, hypertension, and "history of myocardial infarction" with no verifying data available..  She continues to have recurring episodes of left-sided facial, cranial, left arm numbness.  These episodes can last up to 72 hours.  She had an emergency room visit where evaluation was performed which included an MRI and MR angiogram of the brain.  She also has soreness and discomfort in the chest, ever EKGs, delta troponin levels, and a recent coronary CT angio demonstrates no evidence of obstructive coronary disease.  Past Medical History:  Diagnosis Date  . Coronary artery disease   . Depression   . GI bleed 07/17/2015  . Hypertension   . Myocardial infarction ([place])   . Nocturia 08/14/2019  . Refusal of blood transfusions as patient is Jehovah's Witness     Past Surgical History:  Procedure Laterality Date  . ABDOMINAL HYSTERECTOMY    . BACK SURGERY    . ESOPHAGOGASTRODUODENOSCOPY N/A 07/17/2015   Procedure: ESOPHAGOGASTRODUODENOSCOPY (EGD);  Surgeon: Wonda Horner, MD;  Location: [facility] ENDOSCOPY;  Service: Endoscopy;  Laterality: N/A;    Current Medications: Current Meds  Medication Sig  . aspirin EC 81 MG tablet Take 81 mg by mouth daily.  [name] losartan-hydrochlorothiazide (HYZAAR) 50-12.5 MG tablet Take 1 tablet by mouth daily.  . nitroGLYCERIN (NITROSTAT) 0.4 MG SL tablet Place 1 tablet (0.4 mg total) under the tongue every 5 (five) minutes as needed for chest pain.  . pantoprazole (PROTONIX) 40 MG tablet Take 1 tablet (40 mg total) by mouth 2 (two) times daily.    . rosuvastatin (CRESTOR) 20 MG tablet Take 1 tablet (20 mg total) by mouth daily.  . sertraline (ZOLOFT) 50 MG tablet Take 50 mg by mouth daily.     Allergies:   Patient has no known allergies.   Social History   Socioeconomic History  . Marital status: Widowed    Spouse name: Not on file  . Number of children: Not on file  . Years of education: Not on file  . Highest education level: Not on file  Occupational History  . Not on file  Tobacco Use  . Smoking status: Never Smoker  . Smokeless tobacco: Never Used  Substance and Sexual Activity  . Alcohol use: Yes    Comment: rare  . Drug use: No  . Sexual activity: Not on file  Other Topics Concern  . Not on file  Social History Narrative  . Not on file   Social Determinants of Health   Financial Resource Strain:   . Difficulty of Paying Living Expenses:   Food Insecurity:   . Worried About Charity fundraiser in the Last Year:   . Arboriculturist in the Last Year:   Transportation Needs:   . Film/video editor (Medical):   [name] Lack of Transportation (Non-Medical):   Physical Activity:   . Days of Exercise per Week:   . Minutes of Exercise per Session:   Stress:   .  Feeling of Stress :   Social Connections:   . Frequency of Communication with Friends and Family:   . Frequency of Social Gatherings with Friends and Family:   . Attends Religious Services:   . Active Member of Clubs or Organizations:   . Attends Archivist Meetings:   [name] Marital Status:      Family History: The patient's family history includes Heart disease in her brother and father.  ROS:   Please see the history of present illness.    She has anxiety concerning why she has these recurring episodes of left arm weakness, numbness, left face numbness and circumoral paresthesias.  She also had left eye swelling on this occasion.  All other systems reviewed and are negative.  EKGs/Labs/Other Studies Reviewed:    The following studies  were reviewed today: Reviewed the recent EKG and laboratory data from the emergency room  Beta [place]fene antibody was negative for Lyme disease.  EKG:  EKG reviewed and are normal.  Recent Labs: 12/25/2019: ALT 11; BUN 6; Creatinine, Ser 0.83; Hemoglobin 13.1; Platelets 286; Potassium 3.4; Sodium 143  Recent Lipid Panel    Component Value Date/Time   CHOL 145 12/25/2019 1005   TRIG 62 12/25/2019 1005   HDL 61 12/25/2019 1005   CHOLHDL 2.4 12/25/2019 1005   CHOLHDL 4.3 07/08/2009 0530   VLDL 14 07/08/2009 0530   LDLCALC 71 12/25/2019 1005    Physical Exam:    VS:  BP 120/68   Pulse 65   Ht 5\' 4"  (1.626 m)   Wt 175 lb (79.4 kg)   SpO2 97%   BMI 30.04 kg/m     Wt Readings from Last 3 Encounters:  12/28/19 175 lb (79.4 kg)  10/03/19 174 lb (78.9 kg)  08/14/19 171 lb (77.6 kg)     GEN: Moderate obesity. No acute distress HEENT: Normal NECK: No JVD. LYMPHATICS: No lymphadenopathy CARDIAC:  RRR without murmur, gallop, or edema. VASCULAR:  Normal Pulses. No bruits. RESPIRATORY:  Clear to auscultation without rales, wheezing or rhonchi  ABDOMEN: Soft, non-tender, non-distended, No pulsatile mass, MUSCULOSKELETAL: No deformity  SKIN: Warm and dry NEUROLOGIC:  Alert and oriented x 3 PSYCHIATRIC:  Normal affect   ASSESSMENT:    1. Coronary artery calcification seen on CT scan   2. OSA (obstructive sleep apnea)   3. Essential hypertension   4. Educated about COVID-19 virus infection   5. Morbid obesity ([place])   6. Left facial numbness   7. Left arm numbness    PLAN:    In order of problems listed above:  1. She has no significant coronary obstructive disease.  No further work-up at this time. 2. Encourage compliance with CPAP management. 3. Excellent blood pressure. 4. COVID-19 vaccine has been received. 5. Unchanged. 6. We will request a neurology consultation concerning recurrent left arm and face paresthesias.  Negative work-up so far which is also included  an MRI.   As needed cardiology follow-up for an 1 year, which ever occurs first.   Medication Adjustments/Labs and Tests Ordered: Current medicines are reviewed at length with the patient today.  Concerns regarding medicines are outlined above.  Orders Placed This Encounter  Procedures  . Ambulatory referral to Neurology   No orders of the defined types were placed in this encounter.   Patient Instructions  Medication Instructions:  Your physician recommends that you continue on your current medications as directed. Please refer to the Current Medication list given to you today.  *If  you need a refill on your cardiac medications before your next appointment, please call your pharmacy*   Lab Work: None If you have labs (blood work) drawn today and your tests are completely normal, you will receive your results only by: [name] MyChart Message (if you have MyChart) OR . A paper copy in the mail If you have any lab test that is abnormal or we need to change your treatment, we will call you to review the results.   Testing/Procedures: None   Follow-Up: At [facility], you and your health needs are our priority.  As part of our continuing mission to provide you with exceptional heart care, we have created designated Provider Care Teams.  These Care Teams include your primary Cardiologist (physician) and Advanced Practice Providers (APPs -  Physician Assistants and Nurse Practitioners) who all work together to provide you with the care you need, when you need it.  We recommend signing up for the patient portal called "MyChart".  Sign up information is provided on this After Visit Summary.  MyChart is used to connect with patients for Virtual Visits (Telemedicine).  Patients are able to view lab/test results, encounter notes, upcoming appointments, etc.  Non-urgent messages can be sent to your provider as well.   To learn more about what you can do with MyChart, go to  [email or web address].    Your next appointment:   12 month(s)  The format for your next appointment:   In Person  Provider:   You may see Dr. Daneen Schick or one of the following Advanced Practice Providers on your designated Care Team:    Truitt Merle, NP  Cecilie Kicks, NP  Kathyrn Drown, NP    Other Instructions   You have been referred to [facility] Neurology.     Signed, Sinclair Grooms, MD  12/28/2019 3:40 PM    [facility]

## 2019-12-28 ENCOUNTER — Ambulatory Visit: Payer: Medicare Other | Admitting: Interventional Cardiology

## 2019-12-28 ENCOUNTER — Encounter: Payer: Self-pay | Admitting: Interventional Cardiology

## 2019-12-28 ENCOUNTER — Other Ambulatory Visit: Payer: Self-pay

## 2019-12-28 VITALS — BP 120/68 | HR 65 | Ht 64.0 in | Wt 175.0 lb

## 2019-12-28 DIAGNOSIS — I10 Essential (primary) hypertension: Secondary | ICD-10-CM | POA: Diagnosis not present

## 2019-12-28 DIAGNOSIS — R200 Anesthesia of skin: Secondary | ICD-10-CM

## 2019-12-28 DIAGNOSIS — Z7189 Other specified counseling: Secondary | ICD-10-CM | POA: Diagnosis not present

## 2019-12-28 DIAGNOSIS — G4733 Obstructive sleep apnea (adult) (pediatric): Secondary | ICD-10-CM | POA: Diagnosis not present

## 2019-12-28 DIAGNOSIS — I2510 Atherosclerotic heart disease of native coronary artery without angina pectoris: Secondary | ICD-10-CM | POA: Diagnosis not present

## 2019-12-28 NOTE — Patient Instructions (Signed)
Medication Instructions:  Your physician recommends that you continue on your current medications as directed. Please refer to the Current Medication list given to you today.  *If you need a refill on your cardiac medications before your next appointment, please call your pharmacy*   Lab Work: None If you have labs (blood work) drawn today and your tests are completely normal, you will receive your results only by: [name] MyChart Message (if you have MyChart) OR . A paper copy in the mail If you have any lab test that is abnormal or we need to change your treatment, we will call you to review the results.   Testing/Procedures: None   Follow-Up: At [facility], you and your health needs are our priority.  As part of our continuing mission to provide you with exceptional heart care, we have created designated Provider Care Teams.  These Care Teams include your primary Cardiologist (physician) and Advanced Practice Providers (APPs -  Physician Assistants and Nurse Practitioners) who all work together to provide you with the care you need, when you need it.  We recommend signing up for the patient portal called "MyChart".  Sign up information is provided on this After Visit Summary.  MyChart is used to connect with patients for Virtual Visits (Telemedicine).  Patients are able to view lab/test results, encounter notes, upcoming appointments, etc.  Non-urgent messages can be sent to your provider as well.   To learn more about what you can do with MyChart, go to [email or web address].    Your next appointment:   12 month(s)  The format for your next appointment:   In Person  Provider:   You may see Dr. Daneen Schick or one of the following Advanced Practice Providers on your designated Care Team:    Truitt Merle, NP  Cecilie Kicks, NP  Kathyrn Drown, NP    Other Instructions   You have been referred to [facility] Neurology.

## 2020-01-08 NOTE — Progress Notes (Signed)
NEUROLOGY CONSULTATION NOTE  Tanya Walsh MRN: [id number] DOB: [birth year]  Referring provider: Belva Crome, MD Primary care provider: Katherina Mires, MD  Reason for consult:  Left facial numbnes  HISTORY OF PRESENT ILLNESS: Tanya Walsh is a 64 year old right-handed female with CAD s/p MI, OSA and HTN who presents for left sided facial numbness.  History supplemented by ED and referring provider's notes.  She has had recurrent episodes of left sided facial numbness with chest discomfort for about a year.  Numbness radiates down neck and into the left arm and hand.  She says the arm feels weak.  It may be associated with blurred vision, diaphoresis and feeling like she is going to pass out.  She reports associated neck and upper thoracic spinal pain and diffuse headache ("like nerves in my head").  Symptoms last several hours.  It has occurred about 3 times.  She went to the [facility] ED on 05/08/2019 after waking up with left sided facial numbness.  MRI of brain without contrast personally reviewed was normal.  While in the ED, the numbness resolved.  She was discharged in stable condition with instructions for outpatient follow up.  Cardiac workup has been negative, including EKG, troponins and coronary CT angio.  She also notes that sometimes when she wakes up in bed in the middle of the night, both hands are numb.  She reports remote history of migraines.  She also has history of lumbar spine surgery.  PAST MEDICAL HISTORY: Past Medical History:  Diagnosis Date  . Coronary artery disease   . Depression   . GI bleed 07/17/2015  . Hypertension   . Myocardial infarction ([place])   . Nocturia 08/14/2019  . Refusal of blood transfusions as patient is Jehovah's Witness     PAST SURGICAL HISTORY: Past Surgical History:  Procedure Laterality Date  . ABDOMINAL HYSTERECTOMY    . BACK SURGERY    . ESOPHAGOGASTRODUODENOSCOPY N/A 07/17/2015   Procedure: ESOPHAGOGASTRODUODENOSCOPY  (EGD);  Surgeon: Wonda Horner, MD;  Location: [facility] ENDOSCOPY;  Service: Endoscopy;  Laterality: N/A;    MEDICATIONS: Current Outpatient Medications on File Prior to Visit  Medication Sig Dispense Refill  . aspirin EC 81 MG tablet Take 81 mg by mouth daily.    [name] losartan-hydrochlorothiazide (HYZAAR) 50-12.5 MG tablet Take 1 tablet by mouth daily.    . nitroGLYCERIN (NITROSTAT) 0.4 MG SL tablet Place 1 tablet (0.4 mg total) under the tongue every 5 (five) minutes as needed for chest pain. 30 tablet 0  . pantoprazole (PROTONIX) 40 MG tablet Take 1 tablet (40 mg total) by mouth 2 (two) times daily. 60 tablet 0  . rosuvastatin (CRESTOR) 20 MG tablet Take 1 tablet (20 mg total) by mouth daily. 90 tablet 3  . sertraline (ZOLOFT) 50 MG tablet Take 50 mg by mouth daily.     No current facility-administered medications on file prior to visit.    ALLERGIES: No Known Allergies  FAMILY HISTORY: Family History  Problem Relation Age of Onset  . Heart disease Father   . Heart disease Brother     SOCIAL HISTORY: Social History   Socioeconomic History  . Marital status: Widowed    Spouse name: Not on file  . Number of children: Not on file  . Years of education: Not on file  . Highest education level: Not on file  Occupational History  . Not on file  Tobacco Use  . Smoking status: Never Smoker  .  Smokeless tobacco: Never Used  Substance and Sexual Activity  . Alcohol use: Yes    Comment: rare  . Drug use: No  . Sexual activity: Not on file  Other Topics Concern  . Not on file  Social History Narrative  . Not on file   Social Determinants of Health   Financial Resource Strain:   . Difficulty of Paying Living Expenses:   Food Insecurity:   . Worried About Charity fundraiser in the Last Year:   . Arboriculturist in the Last Year:   Transportation Needs:   . Film/video editor (Medical):   [name] Lack of Transportation (Non-Medical):   Physical Activity:   . Days of Exercise per  Week:   . Minutes of Exercise per Session:   Stress:   . Feeling of Stress :   Social Connections:   . Frequency of Communication with Friends and Family:   . Frequency of Social Gatherings with Friends and Family:   . Attends Religious Services:   . Active Member of Clubs or Organizations:   . Attends Archivist Meetings:   [name] Marital Status:   Intimate Partner Violence:   . Fear of Current or Ex-Partner:   . Emotionally Abused:   [name] Physically Abused:   . Sexually Abused:     REVIEW OF SYSTEMS: Constitutional: No fevers, chills, or sweats, no generalized fatigue, change in appetite Eyes: No visual changes, double vision, eye pain Ear, nose and throat: No hearing loss, ear pain, nasal congestion, sore throat Cardiovascular: No chest pain, palpitations Respiratory:  No shortness of breath at rest or with exertion, wheezes GastrointestinaI: No nausea, vomiting, diarrhea, abdominal pain, fecal incontinence Genitourinary:  No dysuria, urinary retention or frequency Musculoskeletal:  No neck pain, back pain Integumentary: No rash, pruritus, skin lesions Neurological: as above Psychiatric: No depression, insomnia, anxiety Endocrine: No palpitations, fatigue, diaphoresis, mood swings, change in appetite, change in weight, increased thirst Hematologic/Lymphatic:  No purpura, petechiae. Allergic/Immunologic: no itchy/runny eyes, nasal congestion, recent allergic reactions, rashes  PHYSICAL EXAM: Blood pressure 119/76, pulse 67, resp. rate 18, height 5' 3.5" (1.613 m), weight 176 lb (79.8 kg), SpO2 99 %. General: No acute distress.  Patient appears well-groomed.  Head:  Normocephalic/atraumatic Eyes:  fundi examined but not visualized Neck: supple, bilateral paraspinal tenderness, full range of motion Back: No paraspinal tenderness Heart: regular rate and rhythm Lungs: Clear to auscultation bilaterally. Vascular: No carotid bruits. Neurological Exam: Mental status: alert  and oriented to person, place, and time, recent and remote memory intact, fund of knowledge intact, attention and concentration intact, speech fluent and not dysarthric, language intact. Cranial nerves: CN I: not tested CN II: pupils equal, round and reactive to light, visual fields intact CN III, IV, VI:  full range of motion, no nystagmus, no ptosis CN V: facial sensation intact CN VII: upper and lower face symmetric CN VIII: hearing intact CN IX, X: gag intact, uvula midline CN XI: sternocleidomastoid and trapezius muscles intact CN XII: tongue midline Bulk & Tone: normal, no fasciculations. Motor:  5/5 throughout  Sensation:  Endorses reduced pinprick sensation in first and second digits of left hand and first and third digits of right hand.  Vibratory sensation intact. Deep Tendon Reflexes:  2+ throughout, toes downgoing.  Finger to nose testing:  Without dysmetria.  Heel to shin:  Without dysmetria.  Gait:  Mildly wide-based gait.  Able to turn and tandem walk. Romberg with sway.  IMPRESSION: 1.  Recurrent  episodes of left facial/arm numbness/arm weakness, blurred vision, neck pain.  May be cervicogenic given associated neck pain.  However, would not really explain blurred vision, diaphoresis and near syncope.  I think we should evaluate for a focal intracranial or extracranial arterial stenosis to account for symptoms as well.  Despite history, I do not think these are migraines.  PLAN: 1.  CTA of head and neck 2.  If CTA unremarkable, refer to physical therapy for neck pain 3.  If physical therapy ineffective, then would pursue MRI of cervical spine. 4.  Follow up.  Thank you for allowing me to take part in the care of this patient.  Metta Clines, DO  CC:  Belva Crome, MD  Suzanna Obey, MD

## 2020-01-09 ENCOUNTER — Ambulatory Visit: Payer: Medicare Other | Admitting: Neurology

## 2020-01-09 ENCOUNTER — Other Ambulatory Visit: Payer: Self-pay

## 2020-01-09 ENCOUNTER — Encounter: Payer: Self-pay | Admitting: Neurology

## 2020-01-09 VITALS — BP 119/76 | HR 67 | Resp 18 | Ht 63.5 in | Wt 176.0 lb

## 2020-01-09 DIAGNOSIS — R29898 Other symptoms and signs involving the musculoskeletal system: Secondary | ICD-10-CM | POA: Diagnosis not present

## 2020-01-09 DIAGNOSIS — H538 Other visual disturbances: Secondary | ICD-10-CM | POA: Diagnosis not present

## 2020-01-09 DIAGNOSIS — R55 Syncope and collapse: Secondary | ICD-10-CM

## 2020-01-09 DIAGNOSIS — R200 Anesthesia of skin: Secondary | ICD-10-CM | POA: Diagnosis not present

## 2020-01-09 DIAGNOSIS — M542 Cervicalgia: Secondary | ICD-10-CM

## 2020-01-09 NOTE — Patient Instructions (Signed)
1.  We will check CTA of head and neck. If unremarkable, I will send you to physical therapy for neck pain 2.  You will follow up with me and if symptoms not improved, we will pursue MRI of cervical spine.

## 2020-02-01 ENCOUNTER — Other Ambulatory Visit: Payer: Medicare Other

## 2020-02-15 ENCOUNTER — Ambulatory Visit
Admission: RE | Admit: 2020-02-15 | Discharge: 2020-02-15 | Disposition: A | Discharge status: Home / Self Care | Payer: Medicare Other | Source: Ambulatory Visit | Attending: Neurology | Admitting: Neurology

## 2020-02-15 DIAGNOSIS — R55 Syncope and collapse: Secondary | ICD-10-CM

## 2020-02-15 DIAGNOSIS — R29898 Other symptoms and signs involving the musculoskeletal system: Secondary | ICD-10-CM

## 2020-02-15 DIAGNOSIS — R200 Anesthesia of skin: Secondary | ICD-10-CM

## 2020-02-15 DIAGNOSIS — H538 Other visual disturbances: Secondary | ICD-10-CM

## 2020-02-15 DIAGNOSIS — M542 Cervicalgia: Secondary | ICD-10-CM

## 2020-02-15 MED ORDER — IOPAMIDOL (ISOVUE-370) INJECTION 76%
75.0000 mL | Freq: Once | INTRAVENOUS | Status: AC | PRN
  Administered 2020-02-15: 75 mL via INTRAVENOUS

## 2020-02-29 ENCOUNTER — Other Ambulatory Visit: Payer: Self-pay

## 2020-02-29 ENCOUNTER — Telehealth (INDEPENDENT_AMBULATORY_CARE_PROVIDER_SITE_OTHER): Payer: Medicare Other | Admitting: Cardiology

## 2020-02-29 ENCOUNTER — Encounter: Payer: Self-pay | Admitting: Cardiology

## 2020-02-29 VITALS — Ht 63.5 in | Wt 173.0 lb

## 2020-02-29 DIAGNOSIS — I10 Essential (primary) hypertension: Secondary | ICD-10-CM | POA: Diagnosis not present

## 2020-02-29 DIAGNOSIS — G4719 Other hypersomnia: Secondary | ICD-10-CM

## 2020-02-29 DIAGNOSIS — G4733 Obstructive sleep apnea (adult) (pediatric): Secondary | ICD-10-CM

## 2020-02-29 NOTE — Patient Instructions (Signed)

## 2020-02-29 NOTE — Progress Notes (Signed)
Virtual Visit via Telephone Note   This visit type was conducted due to national recommendations for restrictions regarding the COVID-19 Pandemic (e.g. social distancing) in an effort to limit this patient's exposure and mitigate transmission in our community.  Due to her co-morbid illnesses, this patient is at least at moderate risk for complications without adequate follow up.  This format is felt to be most appropriate for this patient at this time.  All issues noted in this document were discussed and addressed.  A limited physical exam was performed with this format.  Please refer to the patient's chart for her consent to telehealth for [facility].   Evaluation Performed:  Follow-up visit  This visit type was conducted due to national recommendations for restrictions regarding the COVID-19 Pandemic (e.g. social distancing).  This format is felt to be most appropriate for this patient at this time.  All issues noted in this document were discussed and addressed.  No physical exam was performed (except for noted visual exam findings with Video Visits).  Please refer to the patient's chart (MyChart message for video visits and phone note for telephone visits) for the patient's consent to telehealth for [facility].  Date:  02/29/2020   ID:  Tanya Walsh, Tanya Walsh 03-09-1956, MRN [id number]  Patient Location:  Home  Provider location:   [place]  PCP:  Katherina Mires, MD  Cardiologist:  Daneen Schick, MD Sleep Medicine:  Fransico Him, MD Electrophysiologist:  None   Chief Complaint:  OSA  History of Present Illness:    Sincerity P Myhre is a 64 y.o. female who presents via audio/video conferencing for a telehealth visit today.    This is a 64yo female with a hx of HTN, prediabetes was referred by Dr. Tamala Julian with excessive daytime sleepiness, nocturia and snoring. She underwent sleep study showing mild OSA with an AHI of 7.7/hr and no central sleep apnea. There was soft snoring.   She was seen by me in Jan 2021 and results of sleep study discussed.  Due to her symptoms of excessive daytime sleepiness we proceeded with placing her on auto CPAP.  She is now here for followup.   She is doing well with her CPAP device and thinks that she has gotten used to it.  She tolerates the mask and feels the pressure is adequate.  Since going on CPAP she feels rested in the am and has no significant daytime sleepiness.  She has some mouth dryness and uses Biotine mouthwash.  She also has some nasal congestion at times.  She does not think that he snores.     Prior CV studies:   The following studies were reviewed today:  Sleep study, PAP compliance download  Past Medical History:  Diagnosis Date  . Coronary artery disease   . Depression   . GI bleed 07/17/2015  . Hypertension   . Myocardial infarction ([place])   . Nocturia 08/14/2019  . Refusal of blood transfusions as patient is Jehovah's Witness    Past Surgical History:  Procedure Laterality Date  . ABDOMINAL HYSTERECTOMY    . BACK SURGERY    . ESOPHAGOGASTRODUODENOSCOPY N/A 07/17/2015   Procedure: ESOPHAGOGASTRODUODENOSCOPY (EGD);  Surgeon: Wonda Horner, MD;  Location: [facility] ENDOSCOPY;  Service: Endoscopy;  Laterality: N/A;     Current Meds  Medication Sig  . aspirin EC 81 MG tablet Take 81 mg by mouth daily.  [name] losartan-hydrochlorothiazide (HYZAAR) 50-12.5 MG tablet Take 1 tablet by mouth daily.  . nitroGLYCERIN (  NITROSTAT) 0.4 MG SL tablet Place 1 tablet (0.4 mg total) under the tongue every 5 (five) minutes as needed for chest pain.  . pantoprazole (PROTONIX) 40 MG tablet Take 1 tablet (40 mg total) by mouth 2 (two) times daily. (Patient taking differently: Take 40 mg by mouth as needed. )  . rosuvastatin (CRESTOR) 20 MG tablet Take 1 tablet (20 mg total) by mouth daily.  . sertraline (ZOLOFT) 50 MG tablet Take 50 mg by mouth daily.     Allergies:   Patient has no known allergies.   Social History   Tobacco Use  .  Smoking status: Never Smoker  . Smokeless tobacco: Never Used  Vaping Use  . Vaping Use: Never used  Substance Use Topics  . Alcohol use: Yes    Comment: rare  . Drug use: No     Family Hx: The patient's family history includes Heart disease in her brother and father.  ROS:   Please see the history of present illness.     All other systems reviewed and are negative.   Labs/Other Tests and Data Reviewed:    Recent Labs: 12/25/2019: ALT 11; BUN 6; Creatinine, Ser 0.83; Hemoglobin 13.1; Platelets 286; Potassium 3.4; Sodium 143   Recent Lipid Panel Lab Results  Component Value Date/Time   CHOL 145 12/25/2019 10:05 AM   TRIG 62 12/25/2019 10:05 AM   HDL 61 12/25/2019 10:05 AM   CHOLHDL 2.4 12/25/2019 10:05 AM   CHOLHDL 4.3 07/08/2009 05:30 AM   LDLCALC 71 12/25/2019 10:05 AM    Wt Readings from Last 3 Encounters:  02/29/20 173 lb (78.5 kg)  01/09/20 176 lb (79.8 kg)  12/28/19 175 lb (79.4 kg)     Objective:    Vital Signs:  Ht 5' 3.5" (1.613 m)   Wt 173 lb (78.5 kg)   BMI 30.16 kg/m     ASSESSMENT & PLAN:    1.  OSA -  The patient is tolerating PAP therapy well without any problems. The PAP download was reviewed today and showed an AHI of 3.3/hr on auto CPAP with 60% compliance in using more than 4 hours nightly.   -The patient has been using and benefiting from PAP use and will continue to benefit from therapy. Her compliance was down because a part broke on her mask and it took over a week to get a new part. -encouraged her to use the nasal saline spray twice daliy  2.  HTN -continue Losartan 12.5mg  daily  3.  Excessive daytime sleepiness -this has improved on PAP therapy  Time:   Today, I have spent 20 minutes on telemedicine discussing medical problems including OSA, HTN, daytime sleepiness and reviewing patient's chart including PAP compliance download.  Medication Adjustments/Labs and Tests Ordered: Current medicines are reviewed at length with the  patient today.  Concerns regarding medicines are outlined above.  Tests Ordered: No orders of the defined types were placed in this encounter.  Medication Changes: No orders of the defined types were placed in this encounter.   Disposition:  Follow up in 1 year(s)  Signed, Fransico Him, MD  02/29/2020 8:31 AM    [facility]

## 2020-03-02 ENCOUNTER — Ambulatory Visit (HOSPITAL_COMMUNITY)
Admission: EM | Admit: 2020-03-02 | Discharge: 2020-03-02 | Disposition: A | Discharge status: Home / Self Care | Payer: Medicare Other | Attending: Family Medicine | Admitting: Family Medicine

## 2020-03-02 ENCOUNTER — Encounter (HOSPITAL_COMMUNITY): Payer: Self-pay

## 2020-03-02 ENCOUNTER — Other Ambulatory Visit: Payer: Self-pay

## 2020-03-02 DIAGNOSIS — R109 Unspecified abdominal pain: Secondary | ICD-10-CM

## 2020-03-02 DIAGNOSIS — R5383 Other fatigue: Secondary | ICD-10-CM

## 2020-03-02 DIAGNOSIS — R079 Chest pain, unspecified: Secondary | ICD-10-CM

## 2020-03-02 DIAGNOSIS — R6889 Other general symptoms and signs: Secondary | ICD-10-CM

## 2020-03-02 NOTE — ED Provider Notes (Signed)
[place]    CSN: [id number] Arrival date & time: 03/02/20  1423      History   Chief Complaint Chief Complaint  Patient presents with  . Nausea    HPI Tanya Walsh is a 64 y.o. female.   HPI  Patient presents today for evaluation of multiple nonspecific symptoms that have been occurring over 3 days. She has had some localized headache type pain intermittently, fatigue, nausea without vomiting and some abdominal discomfort which she describes as more abdominal sickness posterior pain.  She initially did not report chest pain however on further questioning and evaluation she states she has been having intermittent left sided chest pressure.  She has not had any shortness of breath however endorses some dizziness intermittently.  She has not passed out.  Does not check her blood pressure at home however she is hypertensive today. Endorses some of her symptoms today are chronic and occur intermittently.2 Past Medical History:  Diagnosis Date  . Coronary artery disease   . Depression   . GI bleed 07/17/2015  . Hypertension   . Myocardial infarction ([place])   . Nocturia 08/14/2019  . Refusal of blood transfusions as patient is Jehovah's Witness     Patient Active Problem List   Diagnosis Date Noted  . Nocturia 08/14/2019  . Melena 07/17/2015  . Incidental lung nodule, less than or equal to 55mm 07/17/2015  . Chest pain at rest 04/16/2014  . Essential hypertension 04/16/2014  . Morbid obesity ([place]) 04/16/2014  . Family history of early CAD 04/16/2014    Past Surgical History:  Procedure Laterality Date  . ABDOMINAL HYSTERECTOMY    . BACK SURGERY    . ESOPHAGOGASTRODUODENOSCOPY N/A 07/17/2015   Procedure: ESOPHAGOGASTRODUODENOSCOPY (EGD);  Surgeon: Wonda Horner, MD;  Location[facility] ENDOSCOPY;  Service: Endoscopy;  Laterality: N/A;    OB History   No obstetric history on file.      Home Medications    Prior to Admission medications   Medication Sig  Start Date End Date Taking? Authorizing Provider  aspirin EC 81 MG tablet Take 81 mg by mouth daily.    [provider]  losartan-hydrochlorothiazide (HYZAAR) 50-12.5 MG tablet Take 1 tablet by mouth daily. 03/21/18   [provider]  nitroGLYCERIN (NITROSTAT) 0.4 MG SL tablet Place 1 tablet (0.4 mg total) under the tongue every 5 (five) minutes as needed for chest pain. 9/93/71   Delora Fuel, MD  pantoprazole (PROTONIX) 40 MG tablet Take 1 tablet (40 mg total) by mouth 2 (two) times daily. Patient taking differently: Take 40 mg by mouth as needed.  07/18/15   Shela Leff, MD  rosuvastatin (CRESTOR) 20 MG tablet Take 1 tablet (20 mg total) by mouth daily. 11/08/19 02/29/20  Belva Crome, MD  sertraline (ZOLOFT) 50 MG tablet Take 50 mg by mouth daily. 05/09/19   [provider]    Family History Family History  Problem Relation Age of Onset  . Heart disease Father   . Heart disease Brother     Social History Social History   Tobacco Use  . Smoking status: Never Smoker  . Smokeless tobacco: Never Used  Vaping Use  . Vaping Use: Never used  Substance Use Topics  . Alcohol use: Yes    Comment: rare  . Drug use: No     Allergies   Patient has no known allergies.   Review of Systems Review of Systems Pertinent negatives listed in HPI  Physical Exam Triage  Vital Signs ED Triage Vitals  Enc Vitals Group     BP 03/02/20 1501 (!) 154/94     Pulse Rate 03/02/20 1501 70     Resp 03/02/20 1501 16     Temp 03/02/20 1501 98.4 F (36.9 C)     Temp Source 03/02/20 1501 Oral     SpO2 03/02/20 1501 100 %     Weight 03/02/20 1502 175 lb (79.4 kg)     Height 03/02/20 1502 5\' 4"  (1.626 m)     Head Circumference --      Peak Flow --      Pain Score 03/02/20 1502 5     Pain Loc --      Pain Edu? --      Excl. in [place]? --    No data found.  Updated Vital Signs BP (!) 154/94   Pulse 70   Temp 98.4 F (36.9 C) (Oral)   Resp 16   Ht 5\' 4"  (1.626 m)    Wt 175 lb (79.4 kg)   SpO2 100%   BMI 30.04 kg/m   Visual Acuity Right Eye Distance:   Left Eye Distance:   Bilateral Distance:    Right Eye Near:   Left Eye Near:    Bilateral Near:     Physical Exam Constitutional:      Appearance: Normal appearance. She is not ill-appearing.  HENT:     Head: Normocephalic and atraumatic.     Nose: Nose normal.  Cardiovascular:     Rate and Rhythm: Normal rate and regular rhythm.  Pulmonary:     Effort: Pulmonary effort is normal.     Breath sounds: Normal breath sounds.  Skin:    General: Skin is warm and dry.     Capillary Refill: Capillary refill takes less than 2 seconds.  Neurological:     General: No focal deficit present.     Mental Status: She is alert.  Psychiatric:        Mood and Affect: Affect is flat.      UC Treatments / Results  Labs (all labs ordered are listed, but only abnormal results are displayed) Labs Reviewed - No data to display  EKG Sinus bradycardia rate 57 otherwise unremarkable EKG no ischemic changes and no significant changes with comparison to most recent EKG.  Radiology No results found.  Procedures Procedures (including critical care time)  Medications Ordered in UC Medications - No data to display  Initial Impression / Assessment and Plan / UC Course  I have reviewed the triage vital signs and the nursing notes.  Pertinent labs & imaging results that were available during my care of the patient were reviewed by me and considered in my medical decision making (see chart for details).    UA normal no indication of severe dehydration. ECG is negative for acute changes. Patient is not in distress, however, given reports of syncopal episodes and left sided chest pain, recommend further work-up in the setting of UC. An After Visit Summary was printed and given to the patient/family. Precautions discussed. Red flags discussed. Questions invited and answered. They voiced understanding and  agreement. Final Clinical Impressions(s) / UC Diagnoses   Final diagnoses:  Feeling sick  Stomach discomfort  Chest pain, unspecified type     Discharge Instructions     Due to the nature of urgent care I am unable to evaluate all of your symptoms however over the most worrisome your symptoms of feeling dizzy and a  sensation of passing out and chest pressure are the most worrisome and I recommend further work up at the Emergency Department given your known history of cardiovascular disease. If you decide not be seen at the Emergency Department, I would recommend at minimal contacting your primary care provider in the morning for further work-up and evaluation of your symptoms.    ED Prescriptions    None     PDMP not reviewed this encounter.   Scot Jun, [place] 03/04/20 2205

## 2020-03-02 NOTE — Discharge Instructions (Addendum)
Due to the nature of urgent care I am unable to evaluate all of your symptoms however over the most worrisome your symptoms of feeling dizzy and a sensation of passing out and chest pressure are the most worrisome and I recommend further work up at the Emergency Department given your known history of cardiovascular disease. If you decide not be seen at the Emergency Department, I would recommend at minimal contacting your primary care provider in the morning for further work-up and evaluation of your symptoms.

## 2020-03-02 NOTE — ED Triage Notes (Signed)
Pt c/o nausea, fatigue, HAx3 days. Pt denies V/D.

## 2020-03-04 ENCOUNTER — Emergency Department (HOSPITAL_COMMUNITY): Payer: Medicare Other

## 2020-03-04 ENCOUNTER — Other Ambulatory Visit: Payer: Self-pay

## 2020-03-04 ENCOUNTER — Emergency Department (HOSPITAL_COMMUNITY)
Admission: EM | Admit: 2020-03-04 | Discharge: 2020-03-04 | Disposition: A | Discharge status: Home / Self Care | Payer: Medicare Other | Attending: Emergency Medicine | Admitting: Emergency Medicine

## 2020-03-04 ENCOUNTER — Encounter (HOSPITAL_COMMUNITY): Payer: Self-pay | Admitting: Emergency Medicine

## 2020-03-04 DIAGNOSIS — I10 Essential (primary) hypertension: Secondary | ICD-10-CM | POA: Insufficient documentation

## 2020-03-04 DIAGNOSIS — R55 Syncope and collapse: Secondary | ICD-10-CM | POA: Insufficient documentation

## 2020-03-04 DIAGNOSIS — R1084 Generalized abdominal pain: Secondary | ICD-10-CM | POA: Diagnosis not present

## 2020-03-04 DIAGNOSIS — R109 Unspecified abdominal pain: Secondary | ICD-10-CM | POA: Diagnosis present

## 2020-03-04 DIAGNOSIS — Z79899 Other long term (current) drug therapy: Secondary | ICD-10-CM | POA: Insufficient documentation

## 2020-03-04 DIAGNOSIS — R519 Headache, unspecified: Secondary | ICD-10-CM | POA: Diagnosis not present

## 2020-03-04 LAB — COMPREHENSIVE METABOLIC PANEL
ALT: 18 U/L (ref 0–44)
AST: 18 U/L (ref 15–41)
Albumin: 4.1 g/dL (ref 3.5–5.0)
Alkaline Phosphatase: 53 U/L (ref 38–126)
Anion gap: 8 (ref 5–15)
BUN: 10 mg/dL (ref 8–23)
CO2: 29 mmol/L (ref 22–32)
Calcium: 9.3 mg/dL (ref 8.9–10.3)
Chloride: 105 mmol/L (ref 98–111)
Creatinine, Ser: 0.71 mg/dL (ref 0.44–1.00)
GFR calc Af Amer: >60 mL/min (ref >60)
GFR calc non Af Amer: >60 mL/min (ref >60)
Glucose, Bld: 105 mg/dL — ABNORMAL HIGH (ref 70–99)
Potassium: 3.9 mmol/L (ref 3.5–5.1)
Sodium: 142 mmol/L (ref 135–145)
Total Bilirubin: 0.4 mg/dL (ref 0.3–1.2)
Total Protein: 7.1 g/dL (ref 6.5–8.1)

## 2020-03-04 LAB — CBC
HCT: 42 % (ref 36.0–46.0)
Hemoglobin: 13.2 g/dL (ref 12.0–15.0)
MCH: 28.8 pg (ref 26.0–34.0)
MCHC: 31.4 g/dL (ref 30.0–36.0)
MCV: 91.7 fL (ref 80.0–100.0)
Platelets: 315 10*3/uL (ref 150–400)
RBC: 4.58 MIL/uL (ref 3.87–5.11)
RDW: 13.1 % (ref 11.5–15.5)
WBC: 5.1 10*3/uL (ref 4.0–10.5)
nRBC: 0 % (ref 0.0–0.2)

## 2020-03-04 LAB — URINALYSIS, ROUTINE W REFLEX MICROSCOPIC
Bilirubin Urine: NEGATIVE
Glucose, UA: NEGATIVE mg/dL
Hgb urine dipstick: NEGATIVE
Ketones, ur: NEGATIVE mg/dL
Leukocytes,Ua: NEGATIVE
Nitrite: NEGATIVE
Protein, ur: NEGATIVE mg/dL
Specific Gravity, Urine: 1.005 (ref 1.005–1.030)
pH: 7 (ref 5.0–8.0)

## 2020-03-04 LAB — TROPONIN I (HIGH SENSITIVITY): Troponin I (High Sensitivity): 4 ng/L (ref <18)

## 2020-03-04 LAB — LIPASE, BLOOD: Lipase: 22 U/L (ref 11–51)

## 2020-03-04 MED ORDER — ONDANSETRON HCL 4 MG/2ML IJ SOLN
4.0000 mg | Freq: Once | INTRAMUSCULAR | Status: DC
  Filled 2020-03-04: qty 2

## 2020-03-04 MED ORDER — MORPHINE SULFATE (PF) 4 MG/ML IV SOLN
4.0000 mg | Freq: Once | INTRAVENOUS | Status: AC
  Administered 2020-03-04: 4 mg via INTRAVENOUS

## 2020-03-04 MED ORDER — SODIUM CHLORIDE 0.9% FLUSH
3.0000 mL | Freq: Once | INTRAVENOUS | Status: AC
  Administered 2020-03-04: 3 mL via INTRAVENOUS

## 2020-03-04 MED ORDER — HYDROCODONE-ACETAMINOPHEN 5-325 MG PO TABS: 1.0000 | ORAL_TABLET | ORAL | 0 refills | Status: DC | PRN

## 2020-03-04 MED ORDER — SODIUM CHLORIDE 0.9 % IV BOLUS
1000.0000 mL | Freq: Once | INTRAVENOUS | Status: AC
  Administered 2020-03-04: 1000 mL via INTRAVENOUS

## 2020-03-04 MED ORDER — MORPHINE SULFATE (PF) 4 MG/ML IV SOLN: 4.0000 mg | Freq: Once | INTRAVENOUS | Status: DC

## 2020-03-04 MED ORDER — MORPHINE SULFATE (PF) 4 MG/ML IV SOLN
4.0000 mg | Freq: Once | INTRAVENOUS | Status: DC
  Filled 2020-03-04: qty 1

## 2020-03-04 MED ORDER — ONDANSETRON 4 MG PO TBDP: 4.0000 mg | ORAL_TABLET | Freq: Once | ORAL | Status: DC

## 2020-03-04 MED ORDER — ONDANSETRON HCL 4 MG/2ML IJ SOLN
4.0000 mg | Freq: Once | INTRAMUSCULAR | Status: AC
  Administered 2020-03-04: 4 mg via INTRAVENOUS

## 2020-03-04 NOTE — ED Provider Notes (Signed)
[place] DEPT Provider Note   CSN: [id number] Arrival date & time: 03/04/20  5397     History Chief Complaint  Patient presents with  . Abdominal Pain  . Flank Pain  . Headache    Tanya Walsh is a 64 y.o. female.  Pt presents to the ED today with low back pain and near-syncope.  Pt said sx have been going on for about 1 month.  Pt denies any cp or sob.  She has had some nausea.  She did go to UC on 6/20.  Pt said she was told her heart was ok.         Past Medical History:  Diagnosis Date  . Coronary artery disease   . Depression   . GI bleed 07/17/2015  . Hypertension   . Myocardial infarction ([place])   . Nocturia 08/14/2019  . Refusal of blood transfusions as patient is Jehovah's Witness     Patient Active Problem List   Diagnosis Date Noted  . Nocturia 08/14/2019  . Melena 07/17/2015  . Incidental lung nodule, less than or equal to 77mm 07/17/2015  . Chest pain at rest 04/16/2014  . Essential hypertension 04/16/2014  . Morbid obesity ([place]) 04/16/2014  . Family history of early CAD 04/16/2014    Past Surgical History:  Procedure Laterality Date  . ABDOMINAL HYSTERECTOMY    . BACK SURGERY    . ESOPHAGOGASTRODUODENOSCOPY N/A 07/17/2015   Procedure: ESOPHAGOGASTRODUODENOSCOPY (EGD);  Surgeon: Wonda Horner, MD;  Location: [facility] ENDOSCOPY;  Service: Endoscopy;  Laterality: N/A;     OB History   No obstetric history on file.     Family History  Problem Relation Age of Onset  . Heart disease Father   . Heart disease Brother     Social History   Tobacco Use  . Smoking status: Never Smoker  . Smokeless tobacco: Never Used  Vaping Use  . Vaping Use: Never used  Substance Use Topics  . Alcohol use: Yes    Comment: rare  . Drug use: No    Home Medications Prior to Admission medications   Medication Sig Start Date End Date Taking? Authorizing Provider  aspirin EC 81 MG tablet Take 81 mg by mouth daily.    [provider]  HYDROcodone-acetaminophen (NORCO/VICODIN) 5-325 MG tablet Take 1 tablet by mouth every 4 (four) hours as needed. 03/04/20   Isla Pence, MD  losartan-hydrochlorothiazide (HYZAAR) 50-12.5 MG tablet Take 1 tablet by mouth daily. 03/21/18   [provider]  nitroGLYCERIN (NITROSTAT) 0.4 MG SL tablet Place 1 tablet (0.4 mg total) under the tongue every 5 (five) minutes as needed for chest pain. 6/73/41   Delora Fuel, MD  pantoprazole (PROTONIX) 40 MG tablet Take 1 tablet (40 mg total) by mouth 2 (two) times daily. Patient taking differently: Take 40 mg by mouth as needed.  07/18/15   Shela Leff, MD  rosuvastatin (CRESTOR) 20 MG tablet Take 1 tablet (20 mg total) by mouth daily. 11/08/19 02/29/20  Belva Crome, MD  sertraline (ZOLOFT) 50 MG tablet Take 50 mg by mouth daily. 05/09/19   [provider]    Allergies    Patient has no known allergies.  Review of Systems   Review of Systems  Neurological: Positive for dizziness.  All other systems reviewed and are negative.   Physical Exam Updated Vital Signs BP (!) 163/99   Pulse 61   Temp 98.8 F (37.1 C) (Oral)   Resp [name])  23   Ht 5\' 4"  (1.626 m)   Wt 78.5 kg   SpO2 99%   BMI 29.70 kg/m   Physical Exam Vitals and nursing note reviewed.  Constitutional:      Appearance: She is well-developed.  HENT:     Head: Normocephalic and atraumatic.     Mouth/Throat:     Mouth: Mucous membranes are moist.     Pharynx: Oropharynx is clear.  Eyes:     Extraocular Movements: Extraocular movements intact.     Pupils: Pupils are equal, round, and reactive to light.  Cardiovascular:     Rate and Rhythm: Normal rate and regular rhythm.  Pulmonary:     Effort: Pulmonary effort is normal.     Breath sounds: Normal breath sounds.  Abdominal:     General: Abdomen is flat. Bowel sounds are normal.     Palpations: Abdomen is soft.  Skin:    General: Skin is warm.     Capillary Refill: Capillary refill  takes less than 2 seconds.  Neurological:     General: No focal deficit present.     Mental Status: She is alert and oriented to person, place, and time.  Psychiatric:        Mood and Affect: Mood normal.        Behavior: Behavior normal.     ED Results / Procedures / Treatments   Labs (all labs ordered are listed, but only abnormal results are displayed) Labs Reviewed  COMPREHENSIVE METABOLIC PANEL - Abnormal; Notable for the following components:      Result Value   Glucose, Bld 105 (*)    All other components within normal limits  URINALYSIS, ROUTINE W REFLEX MICROSCOPIC - Abnormal; Notable for the following components:   Color, Urine COLORLESS (*)    All other components within normal limits  LIPASE, BLOOD  CBC  TROPONIN I (HIGH SENSITIVITY)  TROPONIN I (HIGH SENSITIVITY)    EKG EKG Interpretation  Date/Time:  Tuesday March 04 2020 21:01:36 EDT Ventricular Rate:  61 PR Interval:    QRS Duration: 90 QT Interval:  433 QTC Calculation: 437 R Axis:   30 Text Interpretation: Sinus rhythm No significant change since last tracing Confirmed by Isla Pence [phone]) on 03/04/2020 9:04:40 PM   Radiology DG Chest 2 View  Result Date: 03/04/2020 CLINICAL DATA:  Syncope, flank pain EXAM: CHEST - 2 VIEW COMPARISON:  12/25/2019 FINDINGS: The heart size and mediastinal contours are within normal limits. Both lungs are clear. The visualized skeletal structures are unremarkable. IMPRESSION: No active cardiopulmonary disease. Electronically Signed   By: Donavan Foil M.D.   On: 03/04/2020 21:46   CT Renal Stone Study  Result Date: 03/04/2020 CLINICAL DATA:  Flank pain EXAM: CT ABDOMEN AND PELVIS WITHOUT CONTRAST TECHNIQUE: Multidetector CT imaging of the abdomen and pelvis was performed following the standard protocol without IV contrast. COMPARISON:  CT 07/17/2015 FINDINGS: Lower chest: No acute abnormality. Hepatobiliary: No focal liver abnormality is seen. No gallstones,  gallbladder wall thickening, or biliary dilatation. Pancreas: Unremarkable. No pancreatic ductal dilatation or surrounding inflammatory changes. Spleen: Normal in size without focal abnormality. Adrenals/Urinary Tract: Adrenal glands are unremarkable. Kidneys are normal, without renal calculi, focal lesion, or hydronephrosis. Bladder is unremarkable. Stomach/Bowel: Stomach is within normal limits. Appendix appears normal. No evidence of bowel wall thickening, distention, or inflammatory changes. Sigmoid colon diverticula without acute inflammatory process. Vascular/Lymphatic: Mild aortic atherosclerosis. No aneurysm. No suspicious nodes. Reproductive: Status post hysterectomy.  No adnexal mass Other: Negative  for free air or free fluid Musculoskeletal: Postsurgical changes at L5-S1. Grade 1 anterolisthesis L3 on L4 and L4 on L5. IMPRESSION: 1. Negative for hydronephrosis or ureteral stone. 2. Sigmoid colon diverticular disease without acute inflammatory process. Aortic Atherosclerosis (ICD10-I70.0). Electronically Signed   By: Donavan Foil M.D.   On: 03/04/2020 21:50    Procedures Procedures (including critical care time)  Medications Ordered in ED Medications  sodium chloride flush (NS) 0.9 % injection 3 mL (has no administration in time range)  sodium chloride 0.9 % bolus 1,000 mL (1,000 mLs Intravenous New Bag/Given 03/04/20 2155)  morphine 4 MG/ML injection 4 mg (4 mg Intravenous Given 03/04/20 2158)  ondansetron (ZOFRAN) injection 4 mg (4 mg Intravenous Given 03/04/20 2158)    ED Course  I have reviewed the triage vital signs and the nursing notes.  Pertinent labs & imaging results that were available during my care of the patient were reviewed by me and considered in my medical decision making (see chart for details).    MDM Rules/Calculators/A&P                          Pt is feeling much better.  Labs wnl.  CT renal with nothing acute.  EKG/Trop ok.  Return if worse.  F/u with  pcp.   Final Clinical Impression(s) / ED Diagnoses Final diagnoses:  Generalized abdominal pain  Near syncope    Rx / DC Orders ED Discharge Orders         Ordered    HYDROcodone-acetaminophen (NORCO/VICODIN) 5-325 MG tablet  Every 4 hours PRN     Discontinue  Reprint     03/04/20 2331           Isla Pence, MD 03/04/20 2332

## 2020-03-04 NOTE — ED Triage Notes (Signed)
Patient here from home reporting mid abd pain and left sided flank pain x1 week. Denies n/v. Reports being seen at urgent care with no relief. Also reports headache "weird feeling".

## 2020-03-05 DIAGNOSIS — F329 Major depressive disorder, single episode, unspecified: Secondary | ICD-10-CM | POA: Diagnosis present

## 2020-03-25 NOTE — Progress Notes (Signed)
NEUROLOGY FOLLOW UP OFFICE NOTE  Tanya Walsh [id number]  HISTORY OF PRESENT ILLNESS: Tanya Walsh is a 64 year old right-handed female with CAD s/p MI, OSA and HTN who follows up for left facial numbness.  UPDATE: CTA of head and neck from 02/15/2020 personally reviewed showed mild atherosclerotic calcification in both carotid artery bulbs and carotid siphon but no significant stenosis.    She was seen in Urgent care and ED at [facility] on 03/02/2020 and [facility] ED on 03/04/2020 for recurrence of her habitual symptoms. Abdominal pain, dizziness and near-syncope.  However, no associated numbness or focal weakness.  She has a flare up of her low back pain as well.  Still endorses indescribable head discomfort, like a pressure in the head or soreness on scalp.  Labs, UA and EKG were unremarkable.  It persisted for several days.    HISTORY: She has had recurrent episodes of left sided facial numbness with chest discomfort for about a year.  Numbness radiates down neck and into the left arm and hand.  She says the arm feels weak.  It may be associated with blurred vision, diaphoresis and feeling like she is going to pass out.  She reports associated neck and upper thoracic spinal pain and diffuse headache ("like nerves in my head").  Symptoms last several hours.  It has occurred about 3 times.  She went to the [facility] ED on 05/08/2019 after waking up with left sided facial numbness.  MRI of brain without contrast personally reviewed was normal.  While in the ED, the numbness resolved.  She was discharged in stable condition with instructions for outpatient follow up.  Cardiac workup has been negative, including EKG, troponins and coronary CT angio.  She also notes that sometimes when she wakes up in bed in the middle of the night, both hands are numb.  She reports remote history of migraines.  She also has chronic low back pain with left worse than right radiculopathy with  history of lumbar spine surgery (L5-S1).  PAST MEDICAL HISTORY: Past Medical History:  Diagnosis Date  . Coronary artery disease   . Depression   . GI bleed 07/17/2015  . Hypertension   . Myocardial infarction ([place])   . Nocturia 08/14/2019  . Refusal of blood transfusions as patient is Jehovah's Witness     MEDICATIONS: Current Outpatient Medications on File Prior to Visit  Medication Sig Dispense Refill  . aspirin EC 81 MG tablet Take 81 mg by mouth daily.    [name] HYDROcodone-acetaminophen (NORCO/VICODIN) 5-325 MG tablet Take 1 tablet by mouth every 4 (four) hours as needed. 10 tablet 0  . losartan-hydrochlorothiazide (HYZAAR) 50-12.5 MG tablet Take 1 tablet by mouth daily.    . nitroGLYCERIN (NITROSTAT) 0.4 MG SL tablet Place 1 tablet (0.4 mg total) under the tongue every 5 (five) minutes as needed for chest pain. 30 tablet 0  . pantoprazole (PROTONIX) 40 MG tablet Take 1 tablet (40 mg total) by mouth 2 (two) times daily. (Patient taking differently: Take 40 mg by mouth as needed. ) 60 tablet 0  . rosuvastatin (CRESTOR) 20 MG tablet Take 1 tablet (20 mg total) by mouth daily. 90 tablet 3  . sertraline (ZOLOFT) 50 MG tablet Take 50 mg by mouth daily.     No current facility-administered medications on file prior to visit.    ALLERGIES: No Known Allergies  FAMILY HISTORY: Family History  Problem Relation Age of Onset  . Heart disease  Father   . Heart disease Brother     SOCIAL HISTORY: Social History   Socioeconomic History  . Marital status: Widowed    Spouse name: Not on file  . Number of children: Not on file  . Years of education: Not on file  . Highest education level: Not on file  Occupational History  . Not on file  Tobacco Use  . Smoking status: Never Smoker  . Smokeless tobacco: Never Used  Vaping Use  . Vaping Use: Never used  Substance and Sexual Activity  . Alcohol use: Yes    Comment: rare  . Drug use: No  . Sexual activity: Not on file  Other  Topics Concern  . Not on file  Social History Narrative   Right handed   Drinks caffeine   One story home   Social Determinants of Health   Financial Resource Strain:   . Difficulty of Paying Living Expenses:   Food Insecurity:   . Worried About Charity fundraiser in the Last Year:   . Arboriculturist in the Last Year:   Transportation Needs:   . Film/video editor (Medical):   [name] Lack of Transportation (Non-Medical):   Physical Activity:   . Days of Exercise per Week:   . Minutes of Exercise per Session:   Stress:   . Feeling of Stress :   Social Connections:   . Frequency of Communication with Friends and Family:   . Frequency of Social Gatherings with Friends and Family:   . Attends Religious Services:   . Active Member of Clubs or Organizations:   . Attends Archivist Meetings:   [name] Marital Status:   Intimate Partner Violence:   . Fear of Current or Ex-Partner:   . Emotionally Abused:   [name] Physically Abused:   . Sexually Abused:     PHYSICAL EXAM: Height 5\' 4"  (1.626 m), weight 179 lb 6.4 oz (81.4 kg). General: No acute distress.  Patient appears well-groomed.   Head:  Normocephalic/atraumatic Eyes:  Fundi examined but not visualized Neck: supple, no paraspinal tenderness, full range of motion Heart:  Regular rate and rhythm Lungs:  Clear to auscultation bilaterally Back: No paraspinal tenderness Neurological Exam: alert and oriented to person, place, and time. Attention span and concentration intact, recent and remote memory intact, fund of knowledge intact.  Speech fluent and not dysarthric, language intact.  CN II-XII intact. Bulk and tone normal, give-way weakness in both deltoids, 4+ left EHL, otherwise muscle strength 5/5 throughout.  Sensation to pinprick slightly reduced in left hand and foot, vibratory sensation intact.  Deep tendon reflexes 2+ throughout, toes downgoing.  Finger to nose and heel to shin testing intact.  Gait normal, Romberg  negative.  IMPRESSION: 1.  Recurrent spells.  Recent spells not associated with left sided numbness and weakness.  While she has chronic neck pain and likely has cervical spondylosis, likely not contributing to these spells of near syncope, abdominal pain and diaphoresis.  Low suspicion, but will evaluate for possible epileptogenic etiology 2.  Head discomfort.  Difficult to explain.  Not really a headache.  MRI brain unremarkable.  May be a symptom of her fibromyalgia 3.  Bilateral hand numbness at night.  Likely carpal tunnel syndrome as improved with wrist splints. 4.  Chronic low back pain.  Would recommend following up with spine specialist or pain management.  PLAN: 1.  Will check EEG.  If unremarkable, no further recommendations from a neurologic standpoint.  Metta Clines, DO  CC: Suzanna Obey, MD

## 2020-03-26 ENCOUNTER — Encounter: Payer: Self-pay | Admitting: Neurology

## 2020-03-26 ENCOUNTER — Other Ambulatory Visit: Payer: Self-pay

## 2020-03-26 ENCOUNTER — Ambulatory Visit (INDEPENDENT_AMBULATORY_CARE_PROVIDER_SITE_OTHER): Payer: Medicare Other | Admitting: Neurology

## 2020-03-26 ENCOUNTER — Telehealth: Payer: Self-pay

## 2020-03-26 VITALS — Ht 64.0 in | Wt 179.4 lb

## 2020-03-26 DIAGNOSIS — R519 Headache, unspecified: Secondary | ICD-10-CM | POA: Diagnosis not present

## 2020-03-26 DIAGNOSIS — G8929 Other chronic pain: Secondary | ICD-10-CM | POA: Diagnosis not present

## 2020-03-26 DIAGNOSIS — R55 Syncope and collapse: Secondary | ICD-10-CM | POA: Diagnosis not present

## 2020-03-26 NOTE — Telephone Encounter (Signed)
-----   Message from Pieter Partridge, DO sent at 03/26/2020 10:21 AM EDT ----- This should be addressed by her PCP or pain management doctor if she has one ----- Message ----- From: Venetia Night, CMA Sent: 03/26/2020   9:29 AM EDT To: Pieter Partridge, DO  Pt wanted to know if she could have something sent into her pharmacy for Pain for right now?

## 2020-03-26 NOTE — Patient Instructions (Signed)
1.  Will check EEG to evaluate for any abnormalities to suggest that these events may be atypical seizures.   2.  Recommend following up with spine specialist or pain specialist regarding back (and possibly neck) pain

## 2020-03-26 NOTE — Telephone Encounter (Signed)
Pt advised she needs to address  her pain with her PCP or pain management.

## 2020-04-02 ENCOUNTER — Other Ambulatory Visit: Payer: Medicare Other

## 2020-04-12 IMAGING — DX DG CHEST 2V
2 series · 2 of 2 positions shown · non-contrast
Comparison: 03/01/2018

CLINICAL DATA: Left-sided chest pain.

EXAM:
CHEST - 2 VIEW

[chest pa]
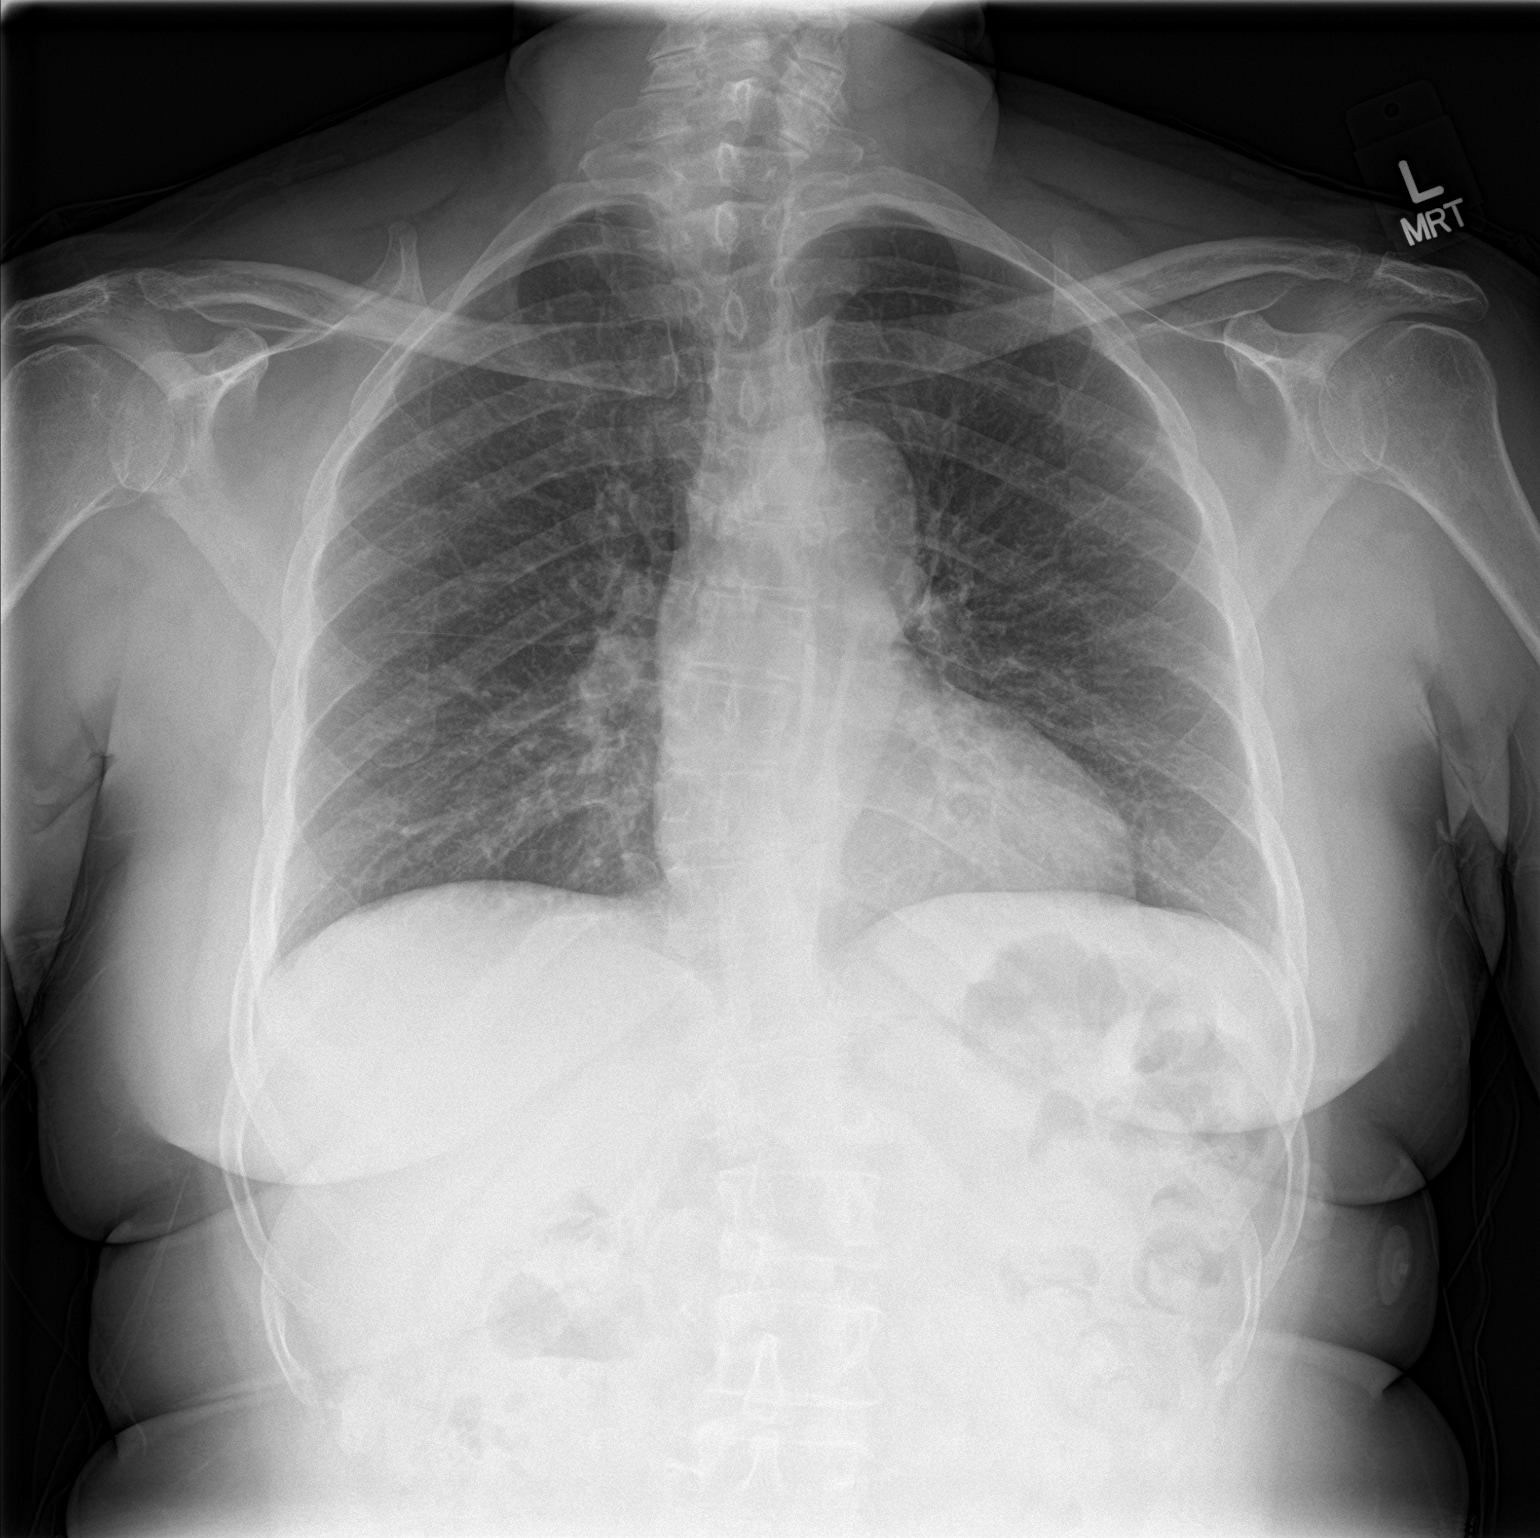

[chest lat]
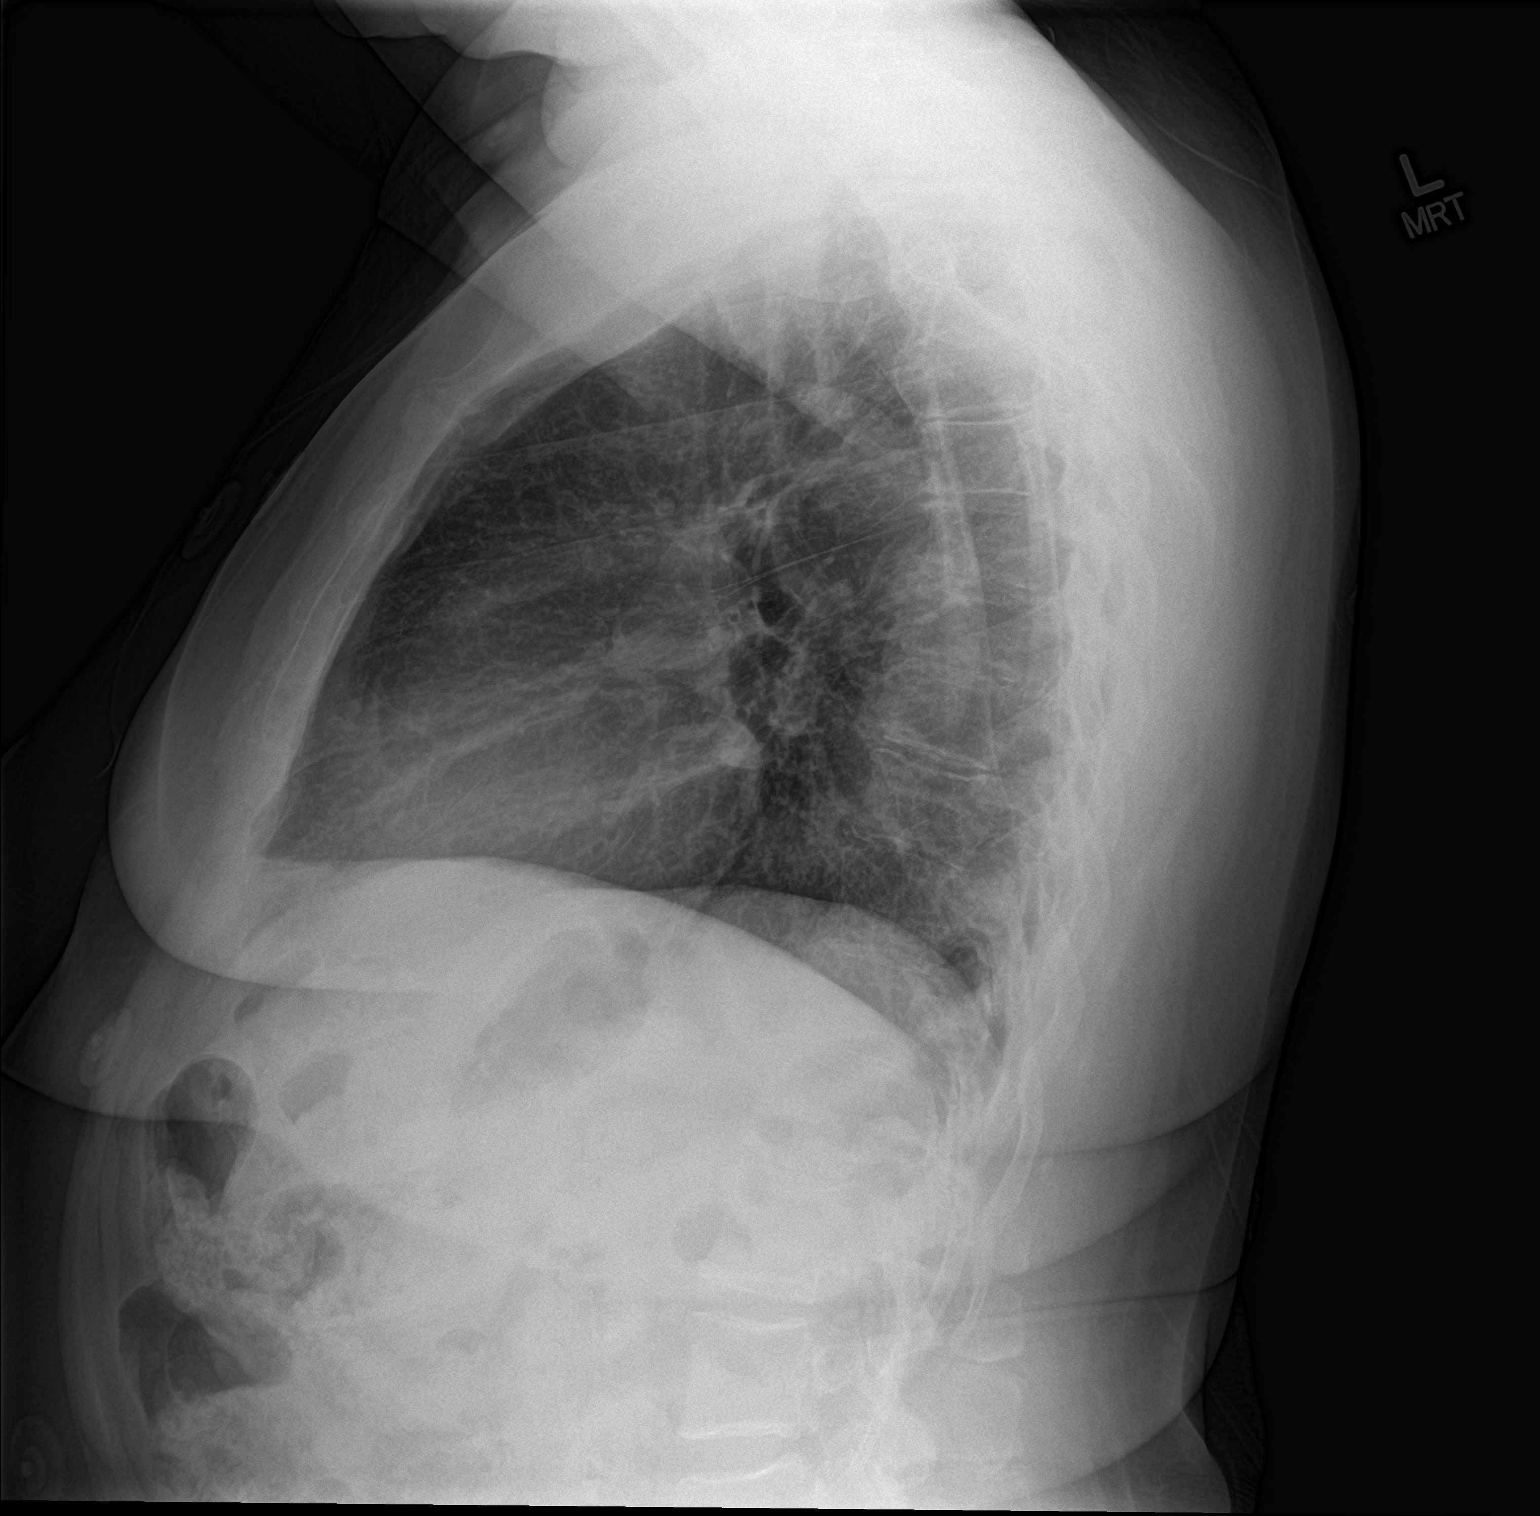

[2 of 2 positions shown; findings below may reference images not displayed]

FINDINGS: There is no focal parenchymal opacity. There is no pleural effusion
or pneumothorax. The heart and mediastinal contours are
unremarkable.

The osseous structures are unremarkable.
IMPRESSION: No active cardiopulmonary disease.

## 2020-04-30 ENCOUNTER — Other Ambulatory Visit: Payer: Self-pay

## 2020-04-30 ENCOUNTER — Ambulatory Visit (INDEPENDENT_AMBULATORY_CARE_PROVIDER_SITE_OTHER): Payer: Medicare Other | Admitting: Neurology

## 2020-04-30 DIAGNOSIS — R55 Syncope and collapse: Secondary | ICD-10-CM | POA: Diagnosis not present

## 2020-04-30 DIAGNOSIS — R519 Headache, unspecified: Secondary | ICD-10-CM

## 2020-05-01 NOTE — Procedures (Signed)
ELECTROENCEPHALOGRAM REPORT  Date of Study: 04/30/2020  Patient's Name: Tanya Walsh MRN: [id number] Date of Birth: 1956-07-30   Clinical History: 64 year old female with CAD who presents with recurrent episodes of left sided facial and arm numbness and near syncope.  Medications: aspirin EC 81 MG tablet NORCO/VICODIN 5-325 MG tablet HYZAAR 50-12.5 MG tablet NITROSTAT 0.4 MG SL tablet PROTONIX 40 MG tablet CRESTOR 20 MG tablet ZOLOFT 50 MG tablet   Technical Summary: A multichannel digital EEG recording measured by the international 10-20 system with electrodes applied with paste and impedances below 5000 ohms performed in our laboratory with EKG monitoring in an awake and asleep patient.  Hyperventilation was not performed as patient was wearing a mask due to the Covid pandemic.  Photic stimulation was performed.  The digital EEG was referentially recorded, reformatted, and digitally filtered in a variety of bipolar and referential montages for optimal display.    Description: The patient is awake and asleep during the recording.  During maximal wakefulness, there is a symmetric, medium voltage 11 Hz posterior dominant rhythm that attenuates with eye opening.  The record is symmetric.  During drowsiness and sleep, there is an increase in theta slowing of the background.  Vertex waves and symmetric sleep spindles were seen.  Photic stimulation did not elicit any abnormalities.  There were no epileptiform discharges or electrographic seizures seen.    EKG lead was unremarkable.  Impression: This awake and asleep EEG is normal.    Clinical Correlation: A normal EEG does not exclude a clinical diagnosis of epilepsy.  If further clinical questions remain, prolonged EEG may be helpful.  Clinical correlation is advised.   Metta Clines, DO

## 2020-05-02 ENCOUNTER — Telehealth: Payer: Self-pay

## 2020-05-02 NOTE — Telephone Encounter (Signed)
-----   Message from Pieter Partridge, DO sent at 05/02/2020  6:08 AM EDT ----- EEG is normal.  No further recommendations.

## 2020-05-02 NOTE — Telephone Encounter (Signed)
Called and informed patient of results of EEG. Patient voiced understanding.

## 2020-06-18 ENCOUNTER — Ambulatory Visit (HOSPITAL_COMMUNITY)
Admission: EM | Admit: 2020-06-18 | Discharge: 2020-06-18 | Disposition: A | Discharge status: Home / Self Care | Payer: Medicare Other | Attending: Family Medicine | Admitting: Family Medicine

## 2020-06-18 ENCOUNTER — Encounter (HOSPITAL_COMMUNITY): Payer: Self-pay | Admitting: Emergency Medicine

## 2020-06-18 ENCOUNTER — Other Ambulatory Visit: Payer: Self-pay

## 2020-06-18 DIAGNOSIS — M961 Postlaminectomy syndrome, not elsewhere classified: Secondary | ICD-10-CM

## 2020-06-18 DIAGNOSIS — M797 Fibromyalgia: Secondary | ICD-10-CM

## 2020-06-18 DIAGNOSIS — M5442 Lumbago with sciatica, left side: Secondary | ICD-10-CM

## 2020-06-18 MED ORDER — KETOROLAC TROMETHAMINE 30 MG/ML IJ SOLN
30.0000 mg | Freq: Once | INTRAMUSCULAR | Status: AC
  Administered 2020-06-18: 30 mg via INTRAMUSCULAR

## 2020-06-18 MED ORDER — PREDNISONE 20 MG PO TABS: 20.0000 mg | ORAL_TABLET | Freq: Two times a day (BID) | ORAL | 0 refills | Status: DC

## 2020-06-18 MED ORDER — TIZANIDINE HCL 4 MG PO TABS: 4.0000 mg | ORAL_TABLET | Freq: Four times a day (QID) | ORAL | 0 refills | Status: DC | PRN

## 2020-06-18 MED ORDER — KETOROLAC TROMETHAMINE 30 MG/ML IJ SOLN
INTRAMUSCULAR | Status: AC
  Filled 2020-06-18: qty 1

## 2020-06-18 MED ORDER — TRAMADOL HCL 50 MG PO TABS: 50.0000 mg | ORAL_TABLET | Freq: Four times a day (QID) | ORAL | 0 refills | Status: DC | PRN

## 2020-06-18 NOTE — Discharge Instructions (Addendum)
Home to rest Warm[place] to area Take prednisone 2 times a day for 5 days.  Take 2 doses today (now 1 at bedtime) Take tramadol as needed for severe pain Take tizanidine as needed as muscle relaxer.  This is useful at bedtime Call your back specialist tomorrow for further instructions

## 2020-06-18 NOTE — ED Provider Notes (Signed)
[facility]    CSN: [id number] Arrival date & time: 06/18/20  1249      History   Chief Complaint Chief Complaint  Patient presents with  . Back Pain    HPI Secret P Tanya Walsh is a 64 y.o. female.   HPI   Patient is a history of chronic low back pain.  She saw an orthopedic surgeon on 06/10/2020.  They planned injections, treatment, possible spinal stimulator for her chronic pain.  She also has fibromyalgia.  She takes duloxetine.  Her back pain has been present for more than 20 years.  She missed her appointment today for injection because of a schedule conflict.  She is uncertain which medicines have been tried in the past.  She states that she previously took tramadol but she stopped this sometime ago.  She took some Flexeril today but it did not help.  She states that she woke up this morning with severe pain.  Tender left low back going down the back of her left leg.  No change in activity.  No fall or injury.  She states that she felt her usual self yesterday.  She has not had a flare of nerve pain like this for some time.  Patient did not call her primary care or spine specialist.  She is here today for evaluation.  She states the pain is "severe".  Comes in in a wheelchair. Patient has a history of well-controlled diabetes.  Also hypertension, coronary artery disease, hyperlipidemia.   Past Medical History:  Diagnosis Date  . Coronary artery disease   . Depression   . GI bleed 07/17/2015  . Hypertension   . Myocardial infarction ([place])   . Nocturia 08/14/2019  . Refusal of blood transfusions as patient is Jehovah's Witness     Patient Active Problem List   Diagnosis Date Noted  . Nocturia 08/14/2019  . Melena 07/17/2015  . Incidental lung nodule, less than or equal to 83mm 07/17/2015  . Chest pain at rest 04/16/2014  . Essential hypertension 04/16/2014  . Morbid obesity ([place]) 04/16/2014  . Family history of early CAD 04/16/2014    Past Surgical History:    Procedure Laterality Date  . ABDOMINAL HYSTERECTOMY    . BACK SURGERY    . ESOPHAGOGASTRODUODENOSCOPY N/A 07/17/2015   Procedure: ESOPHAGOGASTRODUODENOSCOPY (EGD);  Surgeon: Wonda Horner, MD;  Location: [facility] ENDOSCOPY;  Service: Endoscopy;  Laterality: N/A;    OB History   No obstetric history on file.      Home Medications    Prior to Admission medications   Medication Sig Start Date End Date Taking? Authorizing Provider  aspirin EC 81 MG tablet Take 81 mg by mouth daily.    [provider]  DULoxetine (CYMBALTA) 30 MG capsule Take 30 mg by mouth daily. 03/05/20   [provider]  losartan-hydrochlorothiazide (HYZAAR) 50-12.5 MG tablet Take 1 tablet by mouth daily. 03/21/18   [provider]  nitroGLYCERIN (NITROSTAT) 0.4 MG SL tablet Place 1 tablet (0.4 mg total) under the tongue every 5 (five) minutes as needed for chest pain. 1/85/63   Delora Fuel, MD  pantoprazole (PROTONIX) 40 MG tablet Take 1 tablet (40 mg total) by mouth 2 (two) times daily. Patient taking differently: Take 40 mg by mouth as needed.  07/18/15   Shela Leff, MD  predniSONE (DELTASONE) 20 MG tablet Take 1 tablet (20 mg total) by mouth 2 (two) times daily with a meal. 06/18/20   Raylene Everts, MD  rosuvastatin (CRESTOR) 20 MG tablet Take 1 tablet (20 mg total) by mouth daily. 11/08/19 02/29/20  Belva Crome, MD  tiZANidine (ZANAFLEX) 4 MG tablet Take 1-2 tablets (4-8 mg total) by mouth every 6 (six) hours as needed for muscle spasms. 06/18/20   Raylene Everts, MD  traMADol (ULTRAM) 50 MG tablet Take 1 tablet (50 mg total) by mouth every 6 (six) hours as needed. 06/18/20   Raylene Everts, MD    Family History Family History  Problem Relation Age of Onset  . Heart disease Father   . Heart disease Brother     Social History Social History   Tobacco Use  . Smoking status: Never Smoker  . Smokeless tobacco: Never Used  Vaping Use  . Vaping Use: Never used  Substance  Use Topics  . Alcohol use: Yes    Comment: rare  . Drug use: No     Allergies   Patient has no known allergies.   Review of Systems Review of Systems See HPI No bowel or bladder complaints Physical Exam Triage Vital Signs ED Triage Vitals  Enc Vitals Group     BP 06/18/20 1351 140/82     Pulse Rate 06/18/20 1351 69     Resp 06/18/20 1351 20     Temp 06/18/20 1351 (!) 97.5 F (36.4 C)     Temp Source 06/18/20 1351 Oral     SpO2 06/18/20 1351 97 %     Weight --      Height --      Head Circumference --      Peak Flow --      Pain Score 06/18/20 1350 10     Pain Loc --      Pain Edu? --      Excl. in [place]? --    No data found.  Updated Vital Signs BP 140/82 (BP Location: Right Arm)   Pulse 69   Temp (!) 97.5 F (36.4 C) (Oral)   Resp 20   SpO2 97%   Physical Exam Vitals reviewed.  Constitutional:      General: She is not in acute distress.    Appearance: She is well-developed.     Comments: Acutely uncomfortable.  Emotional lability due to pain  HENT:     Head: Normocephalic and atraumatic.     Mouth/Throat:     Comments: Mask is in place Eyes:     Conjunctiva/sclera: Conjunctivae normal.     Pupils: Pupils are equal, round, and reactive to light.  Cardiovascular:     Rate and Rhythm: Normal rate.  Pulmonary:     Effort: Pulmonary effort is normal. No respiratory distress.  Abdominal:     Palpations: Abdomen is soft.  Musculoskeletal:        General: Normal range of motion.     Cervical back: Normal range of motion and neck supple.     Comments: Tenderness palpation in the left SI joint.  No muscle spasm.  No central tenderness.  Well-healed lumbar scars noted.  No neurologic focal findings or loss of reflexes  Skin:    General: Skin is warm and dry.  Neurological:     General: No focal deficit present.     Mental Status: She is alert.  Psychiatric:        Mood and Affect: Mood normal.        Behavior: Behavior normal.      UC Treatments /  Results  Labs (all labs ordered  are listed, but only abnormal results are displayed) Labs Reviewed - No data to display  EKG   Radiology No results found.  Procedures Procedures (including critical care time)  Medications Ordered in UC Medications  ketorolac (TORADOL) 30 MG/ML injection 30 mg (30 mg Intramuscular Given 06/18/20 1445)    Initial Impression / Assessment and Plan / UC Course  I have reviewed the triage vital signs and the nursing notes.  Pertinent labs & imaging results that were available during my care of the patient were reviewed by me and considered in my medical decision making (see chart for details).     We will treat with Toradol for pain acutely.  I am giving her prednisone for 5 days.  I am going to give her a few tramadol, #15 only.  She understands this will not be refilled.  She needs to call her usual providers for follow-up.  She states the Flexeril causes too much drowsiness we will try tizanidine. Final Clinical Impressions(s) / UC Diagnoses   Final diagnoses:  Post-laminectomy syndrome  Fibromyalgia  Acute left-sided back pain with sciatica     Discharge Instructions     Home to rest Warmth to area Take prednisone 2 times a day for 5 days.  Take 2 doses today (now 1 at bedtime) Take tramadol as needed for severe pain Take tizanidine as needed as muscle relaxer.  This is useful at bedtime Call your back specialist tomorrow for further instructions   ED Prescriptions    Medication Sig Dispense Auth. Provider   traMADol (ULTRAM) 50 MG tablet Take 1 tablet (50 mg total) by mouth every 6 (six) hours as needed. 15 tablet Raylene Everts, MD   tiZANidine (ZANAFLEX) 4 MG tablet Take 1-2 tablets (4-8 mg total) by mouth every 6 (six) hours as needed for muscle spasms. 21 tablet Raylene Everts, MD   predniSONE (DELTASONE) 20 MG tablet Take 1 tablet (20 mg total) by mouth 2 (two) times daily with a meal. 10 tablet Raylene Everts, MD       I have reviewed the [place] during this encounter.   Raylene Everts, MD 06/18/20 [phone]

## 2020-06-18 NOTE — ED Triage Notes (Signed)
Pt presents with left side pain xs 2-3 days. States mostly in side area but radiates into lower back.

## 2020-06-20 ENCOUNTER — Emergency Department (HOSPITAL_COMMUNITY)
Admission: EM | Admit: 2020-06-20 | Discharge: 2020-06-20 | Disposition: A | Discharge status: Home / Self Care | Payer: Medicare Other | Attending: Emergency Medicine | Admitting: Emergency Medicine

## 2020-06-20 ENCOUNTER — Emergency Department (HOSPITAL_COMMUNITY): Payer: Medicare Other

## 2020-06-20 ENCOUNTER — Other Ambulatory Visit: Payer: Self-pay

## 2020-06-20 DIAGNOSIS — Z79899 Other long term (current) drug therapy: Secondary | ICD-10-CM | POA: Insufficient documentation

## 2020-06-20 DIAGNOSIS — G8929 Other chronic pain: Secondary | ICD-10-CM

## 2020-06-20 DIAGNOSIS — M545 Low back pain, unspecified: Secondary | ICD-10-CM | POA: Insufficient documentation

## 2020-06-20 DIAGNOSIS — Z7982 Long term (current) use of aspirin: Secondary | ICD-10-CM | POA: Diagnosis not present

## 2020-06-20 DIAGNOSIS — E876 Hypokalemia: Secondary | ICD-10-CM

## 2020-06-20 DIAGNOSIS — R109 Unspecified abdominal pain: Secondary | ICD-10-CM | POA: Diagnosis not present

## 2020-06-20 DIAGNOSIS — I2510 Atherosclerotic heart disease of native coronary artery without angina pectoris: Secondary | ICD-10-CM | POA: Insufficient documentation

## 2020-06-20 DIAGNOSIS — I119 Hypertensive heart disease without heart failure: Secondary | ICD-10-CM | POA: Diagnosis not present

## 2020-06-20 LAB — CBC WITH DIFFERENTIAL/PLATELET
Abs Immature Granulocytes: 0.05 10*3/uL (ref 0.00–0.07)
Basophils Absolute: 0 10*3/uL (ref 0.0–0.1)
Basophils Relative: 0 %
Eosinophils Absolute: 0 10*3/uL (ref 0.0–0.5)
Eosinophils Relative: 0 %
HCT: 36.1 % (ref 36.0–46.0)
Hemoglobin: 11.1 g/dL — ABNORMAL LOW (ref 12.0–15.0)
Immature Granulocytes: 1 %
Lymphocytes Relative: 17 %
Lymphs Abs: 1.8 10*3/uL (ref 0.7–4.0)
MCH: 25.3 pg — ABNORMAL LOW (ref 26.0–34.0)
MCHC: 30.7 g/dL (ref 30.0–36.0)
MCV: 82.4 fL (ref 80.0–100.0)
Monocytes Absolute: 0.6 10*3/uL (ref 0.1–1.0)
Monocytes Relative: 6 %
Neutro Abs: 8 10*3/uL — ABNORMAL HIGH (ref 1.7–7.7)
Neutrophils Relative %: 76 %
Platelets: 362 10*3/uL (ref 150–400)
RBC: 4.38 MIL/uL (ref 3.87–5.11)
RDW: 13.9 % (ref 11.5–15.5)
WBC: 10.4 10*3/uL (ref 4.0–10.5)
nRBC: 0 % (ref 0.0–0.2)

## 2020-06-20 LAB — BASIC METABOLIC PANEL
Anion gap: 10 (ref 5–15)
BUN: 9 mg/dL (ref 8–23)
CO2: 26 mmol/L (ref 22–32)
Calcium: 9.6 mg/dL (ref 8.9–10.3)
Chloride: 103 mmol/L (ref 98–111)
Creatinine, Ser: 0.85 mg/dL (ref 0.44–1.00)
GFR calc non Af Amer: >60 mL/min (ref >60)
Glucose, Bld: 100 mg/dL — ABNORMAL HIGH (ref 70–99)
Potassium: 3.4 mmol/L — ABNORMAL LOW (ref 3.5–5.1)
Sodium: 139 mmol/L (ref 135–145)

## 2020-06-20 MED ORDER — HYDROMORPHONE HCL 1 MG/ML IJ SOLN
1.0000 mg | Freq: Once | INTRAMUSCULAR | Status: AC
  Administered 2020-06-20: 1 mg via INTRAVENOUS
  Filled 2020-06-20: qty 1

## 2020-06-20 MED ORDER — POTASSIUM CHLORIDE CRYS ER 20 MEQ PO TBCR
40.0000 meq | EXTENDED_RELEASE_TABLET | Freq: Once | ORAL | Status: AC
  Administered 2020-06-20: 40 meq via ORAL
  Filled 2020-06-20: qty 2

## 2020-06-20 MED ORDER — ONDANSETRON HCL 4 MG/2ML IJ SOLN
4.0000 mg | Freq: Once | INTRAMUSCULAR | Status: AC
  Administered 2020-06-20: 4 mg via INTRAVENOUS
  Filled 2020-06-20: qty 2

## 2020-06-20 NOTE — ED Notes (Signed)
Patient verbalizes understanding of discharge instructions. Opportunity for questioning and answers were provided. Armband removed by staff, pt discharged from ED via wheelchair to lobby to return home with friend.

## 2020-06-20 NOTE — ED Provider Notes (Signed)
[facility] EMERGENCY DEPARTMENT Provider Note   CSN: [id number] Arrival date & time: 06/20/20  6761     History Chief Complaint  Patient presents with  . Flank Pain    Tanya Walsh is a 64 y.o. female.  Patient c/o left low back pain, and left lateral/posterior flank pain. States hx ddd, remote prior back surgery/fusion, and chronic/recurrent low back pain. Current symptoms present for past few weeks, moderate, dull to sharp, worse w certain movements and position changes. No dysuria or hematuria. No hx kidney stones. No recent fall or injury. Recently went to urgent care symptoms - notes mild improvement with prednisone and ultram. No saddle area or leg numbness. On weakness. No urinary retention (or incontinence). No stool incontinence. Denies fever or chills.   The history is provided by the patient.  Flank Pain Pertinent negatives include no chest pain, no abdominal pain and no shortness of breath.       Past Medical History:  Diagnosis Date  . Coronary artery disease   . Depression   . GI bleed 07/17/2015  . Hypertension   . Myocardial infarction ([place])   . Nocturia 08/14/2019  . Refusal of blood transfusions as patient is Jehovah's Witness     Patient Active Problem List   Diagnosis Date Noted  . Nocturia 08/14/2019  . Melena 07/17/2015  . Incidental lung nodule, less than or equal to 61mm 07/17/2015  . Chest pain at rest 04/16/2014  . Essential hypertension 04/16/2014  . Morbid obesity ([place]) 04/16/2014  . Family history of early CAD 04/16/2014    Past Surgical History:  Procedure Laterality Date  . ABDOMINAL HYSTERECTOMY    . BACK SURGERY    . ESOPHAGOGASTRODUODENOSCOPY N/A 07/17/2015   Procedure: ESOPHAGOGASTRODUODENOSCOPY (EGD);  Surgeon: Wonda Horner, MD;  Location: [facility] ENDOSCOPY;  Service: Endoscopy;  Laterality: N/A;     OB History   No obstetric history on file.     Family History  Problem Relation Age of Onset  . Heart  disease Father   . Heart disease Brother     Social History   Tobacco Use  . Smoking status: Never Smoker  . Smokeless tobacco: Never Used  Vaping Use  . Vaping Use: Never used  Substance Use Topics  . Alcohol use: Yes    Comment: rare  . Drug use: No    Home Medications Prior to Admission medications   Medication Sig Start Date End Date Taking? Authorizing Provider  aspirin EC 81 MG tablet Take 81 mg by mouth daily.    [provider]  DULoxetine (CYMBALTA) 30 MG capsule Take 30 mg by mouth daily. 03/05/20   [provider]  losartan-hydrochlorothiazide (HYZAAR) 50-12.5 MG tablet Take 1 tablet by mouth daily. 03/21/18   [provider]  nitroGLYCERIN (NITROSTAT) 0.4 MG SL tablet Place 1 tablet (0.4 mg total) under the tongue every 5 (five) minutes as needed for chest pain. 9/50/93   Delora Fuel, MD  pantoprazole (PROTONIX) 40 MG tablet Take 1 tablet (40 mg total) by mouth 2 (two) times daily. Patient taking differently: Take 40 mg by mouth as needed.  07/18/15   Shela Leff, MD  predniSONE (DELTASONE) 20 MG tablet Take 1 tablet (20 mg total) by mouth 2 (two) times daily with a meal. 06/18/20   Raylene Everts, MD  rosuvastatin (CRESTOR) 20 MG tablet Take 1 tablet (20 mg total) by mouth daily. 11/08/19 02/29/20  Belva Crome, MD  tiZANidine (ZANAFLEX) 4  MG tablet Take 1-2 tablets (4-8 mg total) by mouth every 6 (six) hours as needed for muscle spasms. 06/18/20   Raylene Everts, MD  traMADol (ULTRAM) 50 MG tablet Take 1 tablet (50 mg total) by mouth every 6 (six) hours as needed. 06/18/20   Raylene Everts, MD    Allergies    Patient has no known allergies.  Review of Systems   Review of Systems  Constitutional: Negative for chills and fever.  HENT: Negative for sore throat.   Eyes: Negative for redness.  Respiratory: Negative for cough and shortness of breath.   Cardiovascular: Negative for chest pain.  Gastrointestinal: Negative for  abdominal pain and vomiting.  Genitourinary: Positive for flank pain. Negative for dysuria.  Musculoskeletal: Positive for back pain. Negative for neck pain.  Skin: Negative for rash.  Neurological: Negative for weakness and numbness.  Hematological: Does not bruise/bleed easily.  Psychiatric/Behavioral: Negative for confusion.    Physical Exam Updated Vital Signs BP (!) 172/96   Pulse 67   Temp 98.6 F (37 C) (Oral)   Resp 15   Ht 1.626 m (5\' 4" )   Wt 81.6 kg   BMI 30.90 kg/m   Physical Exam Vitals and nursing note reviewed.  Constitutional:      Appearance: Normal appearance. She is well-developed.  HENT:     Head: Atraumatic.     Nose: Nose normal.     Mouth/Throat:     Mouth: Mucous membranes are moist.  Eyes:     General: No scleral icterus.    Conjunctiva/sclera: Conjunctivae normal.  Neck:     Trachea: No tracheal deviation.  Cardiovascular:     Rate and Rhythm: Normal rate and regular rhythm.     Pulses: Normal pulses.     Heart sounds: Normal heart sounds. No murmur heard.  No friction rub. No gallop.   Pulmonary:     Effort: Pulmonary effort is normal. No respiratory distress.     Breath sounds: Normal breath sounds.  Abdominal:     General: Bowel sounds are normal. There is no distension.     Palpations: Abdomen is soft. There is no mass.     Tenderness: There is no abdominal tenderness. There is no guarding or rebound.     Hernia: No hernia is present.  Genitourinary:    Comments: No cva tenderness.  Musculoskeletal:        General: No swelling.     Cervical back: Normal range of motion and neck supple. No rigidity. No muscular tenderness.     Right lower leg: No edema.     Left lower leg: No edema.     Comments: T/L/S spine non tender, aligned. Well healed lumbar surgical scars.   Skin:    General: Skin is warm and dry.     Findings: No rash.  Neurological:     Mental Status: She is alert.     Comments: Alert, speech normal. bil lower ext,  stre 5/5. sens intact bil. Symmetric reflexes. Steady gait.   Psychiatric:        Mood and Affect: Mood normal.     ED Results / Procedures / Treatments   Labs (all labs ordered are listed, but only abnormal results are displayed) Results for orders placed or performed during the hospital encounter of 29/79/89  Basic metabolic panel  Result Value Ref Range   Sodium 139 135 - 145 mmol/L   Potassium 3.4 (L) 3.5 - 5.1 mmol/L   Chloride 103  98 - 111 mmol/L   CO2 26 22 - 32 mmol/L   Glucose, Bld 100 (H) 70 - 99 mg/dL   BUN 9 8 - 23 mg/dL   Creatinine, Ser 0.85 0.44 - 1.00 mg/dL   Calcium 9.6 8.9 - 10.3 mg/dL   GFR calc non Af Amer >60 >60 mL/min   Anion gap 10 5 - 15  CBC with Differential  Result Value Ref Range   WBC 10.4 4.0 - 10.5 K/uL   RBC 4.38 3.87 - 5.11 MIL/uL   Hemoglobin 11.1 (L) 12.0 - 15.0 g/dL   HCT 36.1 36 - 46 %   MCV 82.4 80.0 - 100.0 fL   MCH 25.3 (L) 26.0 - 34.0 pg   MCHC 30.7 30.0 - 36.0 g/dL   RDW 13.9 11.5 - 15.5 %   Platelets 362 150 - 400 K/uL   nRBC 0.0 0.0 - 0.2 %   Neutrophils Relative % 76 %   Neutro Abs 8.0 (H) 1.7 - 7.7 K/uL   Lymphocytes Relative 17 %   Lymphs Abs 1.8 0.7 - 4.0 K/uL   Monocytes Relative 6 %   Monocytes Absolute 0.6 0.1 - 1.0 K/uL   Eosinophils Relative 0 %   Eosinophils Absolute 0.0 0 - 0 K/uL   Basophils Relative 0 %   Basophils Absolute 0.0 0 - 0 K/uL   Immature Granulocytes 1 %   Abs Immature Granulocytes 0.05 0.00 - 0.07 K/uL   CT Renal Stone Study  Result Date: 06/20/2020 CLINICAL DATA:  Acute left flank pain. EXAM: CT ABDOMEN AND PELVIS WITHOUT CONTRAST TECHNIQUE: Multidetector CT imaging of the abdomen and pelvis was performed following the standard protocol without IV contrast. COMPARISON:  March 04, 2020. FINDINGS: Lower chest: No acute abnormality. Hepatobiliary: No focal liver abnormality is seen. No gallstones, gallbladder wall thickening, or biliary dilatation. Pancreas: Unremarkable. No pancreatic ductal  dilatation or surrounding inflammatory changes. Spleen: Normal in size without focal abnormality. Adrenals/Urinary Tract: Adrenal glands and kidneys appear normal. No hydronephrosis or renal obstruction is noted. No renal or ureteral calculi are noted. Mild urinary bladder distention is noted. Stomach/Bowel: Stomach is within normal limits. Appendix appears normal. No evidence of bowel wall thickening, distention, or inflammatory changes. Sigmoid diverticulosis is noted without inflammation. Vascular/Lymphatic: Aortic atherosclerosis. No enlarged abdominal or pelvic lymph nodes. Reproductive: Status post hysterectomy. No adnexal masses. Other: No abdominal wall hernia or abnormality. No abdominopelvic ascites. Musculoskeletal: No acute or significant osseous findings. IMPRESSION: 1. Sigmoid diverticulosis without inflammation. 2. Mild urinary bladder distention is noted. 3. No hydronephrosis or renal obstruction is noted. No renal or ureteral calculi are noted. 4. Aortic atherosclerosis. Aortic Atherosclerosis (ICD10-I70.0). Electronically Signed   By: Marijo Conception M.D.   On: 06/20/2020 11:31    EKG None  Radiology CT Renal Stone Study  Result Date: 06/20/2020 CLINICAL DATA:  Acute left flank pain. EXAM: CT ABDOMEN AND PELVIS WITHOUT CONTRAST TECHNIQUE: Multidetector CT imaging of the abdomen and pelvis was performed following the standard protocol without IV contrast. COMPARISON:  March 04, 2020. FINDINGS: Lower chest: No acute abnormality. Hepatobiliary: No focal liver abnormality is seen. No gallstones, gallbladder wall thickening, or biliary dilatation. Pancreas: Unremarkable. No pancreatic ductal dilatation or surrounding inflammatory changes. Spleen: Normal in size without focal abnormality. Adrenals/Urinary Tract: Adrenal glands and kidneys appear normal. No hydronephrosis or renal obstruction is noted. No renal or ureteral calculi are noted. Mild urinary bladder distention is noted. Stomach/Bowel:  Stomach is within normal limits. Appendix appears normal. No  evidence of bowel wall thickening, distention, or inflammatory changes. Sigmoid diverticulosis is noted without inflammation. Vascular/Lymphatic: Aortic atherosclerosis. No enlarged abdominal or pelvic lymph nodes. Reproductive: Status post hysterectomy. No adnexal masses. Other: No abdominal wall hernia or abnormality. No abdominopelvic ascites. Musculoskeletal: No acute or significant osseous findings. IMPRESSION: 1. Sigmoid diverticulosis without inflammation. 2. Mild urinary bladder distention is noted. 3. No hydronephrosis or renal obstruction is noted. No renal or ureteral calculi are noted. 4. Aortic atherosclerosis. Aortic Atherosclerosis (ICD10-I70.0). Electronically Signed   By: Marijo Conception M.D.   On: 06/20/2020 11:31    Procedures Procedures (including critical care time)  Medications Ordered in ED Medications  HYDROmorphone (DILAUDID) injection 1 mg (has no administration in time range)  ondansetron (ZOFRAN) injection 4 mg (has no administration in time range)    ED Course  I have reviewed the triage vital signs and the nursing notes.  Pertinent labs & imaging results that were available during my care of the patient were reviewed by me and considered in my medical decision making (see chart for details).    MDM Rules/Calculators/A&P                          Rn had placed iv and used standing orders for labs.   Reviewed nursing notes and prior charts for additional history.  Recent UC visit for same symptom reviewed. Pt with CT 6/21 for flank pain, neg for acute process then.   Dilaudid 1 mg iv. zofran iv.   Labs reviewed/interpreted by me - chem normal except k sl low. kcl po.  CT reviewed/interpreted by me - no ureteral stone.   Pain improved. abd soft nt. Pt appears stable for d/c.  Rec pcp and back specialty f/u.       Final Clinical Impression(s) / ED Diagnoses Final diagnoses:  None    Rx /  DC Orders ED Discharge Orders    None       Lajean Saver, MD 06/20/20 1232

## 2020-06-20 NOTE — ED Triage Notes (Signed)
"  For months I have had this pain on my left lower back side and I went to urgent care of Wednesday and they gave me some shot to help my pain and it did help. I have some frequency urinating too." Pt alert and ambulatory. Color WNL.

## 2020-06-20 NOTE — Discharge Instructions (Addendum)
It was our pleasure to provide your ER care today - we hope that you feel better.  Avoid bending at waist or heavy lifting > 10 lbs. Try heat therapy and/or gentle massage to sore area.  Take your pain medication and/or muscle relaxer as need.    From today's labs, your potassium level is slightly low - eat plenty of fruits and vegetables, and follow up with primary care doctor in the next couple weeks.   Follow up with your doctor/back specialist in the next 1-2 weeks.  You were given pain medication in the ER - no driving for the next 6 hours.   Return to ER if worse, new symptoms, fevers, numbness/weakness, problems with bowel or bladder function, or other concern.

## 2020-06-25 ENCOUNTER — Telehealth: Payer: Self-pay | Admitting: Interventional Cardiology

## 2020-06-25 NOTE — Telephone Encounter (Signed)
Pt with intermittent chest pressure located on left side around the breast area for about a week now. Went to UC last Wednesday and EKG was fine.  They thought it could possibly be sciatica pain so gave prednisone, tramadol and a muscle relaxer which didn't help.  Went to ER later that week but nothing remarkable.  Seen PCP Monday and they advised pt to f/u with Cardiology.  Not currently having pressure but it comes and goes.  No vitals available.  Occasionally has a HA with pressure but not every time.  Added pt to Dr. Thompson Caul schedule on Friday and advised if any changes before appt, report to ER.  Pt verbalized understanding and was in agreement with plan.

## 2020-06-25 NOTE — Telephone Encounter (Signed)
Pt c/o of Chest Pain: STAT if CP now or developed within 24 hours  1. Are you having CP right now? Chest pressure- not at this time  2. Are you experiencing any other symptoms (ex. SOB, nausea, vomiting, sweating)? no  3. How long have you been experiencing CP? 06-23-20  4. Is your CP continuous or coming and going? Comes and goes  5. Have you taken Nitroglycerin? Yes- Pt wants  To be seen- I made her a Virtual Visit with Cecilie Kicks on 06-30-20- Please call to evaluate ?

## 2020-06-27 ENCOUNTER — Ambulatory Visit: Payer: Medicare Other | Admitting: Interventional Cardiology

## 2020-06-27 ENCOUNTER — Encounter: Payer: Self-pay | Admitting: Interventional Cardiology

## 2020-06-27 ENCOUNTER — Other Ambulatory Visit: Payer: Self-pay

## 2020-06-27 VITALS — BP 118/64 | HR 83 | Ht 64.0 in | Wt 176.8 lb

## 2020-06-27 DIAGNOSIS — G4733 Obstructive sleep apnea (adult) (pediatric): Secondary | ICD-10-CM | POA: Diagnosis not present

## 2020-06-27 DIAGNOSIS — I10 Essential (primary) hypertension: Secondary | ICD-10-CM | POA: Diagnosis not present

## 2020-06-27 DIAGNOSIS — Z7189 Other specified counseling: Secondary | ICD-10-CM

## 2020-06-27 DIAGNOSIS — R072 Precordial pain: Secondary | ICD-10-CM

## 2020-06-27 DIAGNOSIS — I2510 Atherosclerotic heart disease of native coronary artery without angina pectoris: Secondary | ICD-10-CM

## 2020-06-27 NOTE — Progress Notes (Signed)
Cardiology Office Note:    Date:  06/27/2020   ID:  Saidee, Geremia 22-Apr-1956, MRN [id number]  PCP:  Katherina Mires, MD  Cardiologist:  Sinclair Grooms, MD   Referring MD: Katherina Mires, MD   No chief complaint on file.   History of Present Illness:    Tanya Walsh is a 64 y.o. female with a hx of chest pain, hypertension,"history of myocardial infarction" with no verifying data available, and normal coronary angio 2021.  She had acute onset left lower back pain radiating into her left leg and around her rib cage.  The pain was severe.  She was at home alone.  The pain was so severe that she was crying.  During this situation she began feeling tightness in her chest.  The tightness increased in intensity to the point if she did take a sublingual nitro which seemed to help.  The discomfort lasted a significant period of time but gradually improved.  It has not recurred since that time.  She is now feeling much better.  She did get injections by a neuroradiologist.  She is here today because she is concerned that she had a cardiac event.  She has not had discomfort with exertion.  She has not been short of breath, having orthopnea, or other significant cardiac findings.  Past Medical History:  Diagnosis Date  . Coronary artery disease   . Depression   . GI bleed 07/17/2015  . Hypertension   . Myocardial infarction ([place])   . Nocturia 08/14/2019  . Refusal of blood transfusions as patient is Jehovah's Witness     Past Surgical History:  Procedure Laterality Date  . ABDOMINAL HYSTERECTOMY    . BACK SURGERY    . ESOPHAGOGASTRODUODENOSCOPY N/A 07/17/2015   Procedure: ESOPHAGOGASTRODUODENOSCOPY (EGD);  Surgeon: Wonda Horner, MD;  Location: [facility] ENDOSCOPY;  Service: Endoscopy;  Laterality: N/A;    Current Medications: No outpatient medications have been marked as taking for the 06/27/20 encounter (Appointment) with Belva Crome, MD.     Allergies:   Patient has no  known allergies.   Social History   Socioeconomic History  . Marital status: Widowed    Spouse name: Not on file  . Number of children: Not on file  . Years of education: Not on file  . Highest education level: Not on file  Occupational History  . Not on file  Tobacco Use  . Smoking status: Never Smoker  . Smokeless tobacco: Never Used  Vaping Use  . Vaping Use: Never used  Substance and Sexual Activity  . Alcohol use: Yes    Comment: rare  . Drug use: No  . Sexual activity: Not on file  Other Topics Concern  . Not on file  Social History Narrative   Right handed   Drinks caffeine   One story home   Social Determinants of Health   Financial Resource Strain:   . Difficulty of Paying Living Expenses: Not on file  Food Insecurity:   . Worried About Charity fundraiser in the Last Year: Not on file  . Ran Out of Food in the Last Year: Not on file  Transportation Needs:   . Lack of Transportation (Medical): Not on file  . Lack of Transportation (Non-Medical): Not on file  Physical Activity:   . Days of Exercise per Week: Not on file  . Minutes of Exercise per Session: Not on file  Stress:   . Feeling of  Stress : Not on file  Social Connections:   . Frequency of Communication with Friends and Family: Not on file  . Frequency of Social Gatherings with Friends and Family: Not on file  . Attends Religious Services: Not on file  . Active Member of Clubs or Organizations: Not on file  . Attends Archivist Meetings: Not on file  . Marital Status: Not on file     Family History: The patient's family history includes Heart disease in her brother and father.  ROS:   Please see the history of present illness.    Anxiety.  Otherwise okay.  All other systems reviewed and are negative.  EKGs/Labs/Other Studies Reviewed:    The following studies were reviewed today:  Coronary CTA in September 28, 2019: IMPRESSION: 1. Moderate mixed CAD of the proximal RCA,  proximal to mid LAD and ostial ramus intermedius , CADRADS = 3. CT FFR will be performed and reported separately.  2. Coronary calcium score of 594. This was 97th percentile for age and sex matched control.  3. Normal coronary origin with right dominance.  IMPRESSION: 1.  CT FFR analysis did not show any significant stenosis.   EKG:  EKG normal sinus rhythm with normal EKG appearance  Recent Labs: 03/04/2020: ALT 18 06/20/2020: BUN 9; Creatinine, Ser 0.85; Hemoglobin 11.1; Platelets 362; Potassium 3.4; Sodium 139  Recent Lipid Panel    Component Value Date/Time   CHOL 145 12/25/2019 1005   TRIG 62 12/25/2019 1005   HDL 61 12/25/2019 1005   CHOLHDL 2.4 12/25/2019 1005   CHOLHDL 4.3 07/08/2009 0530   VLDL 14 07/08/2009 0530   LDLCALC 71 12/25/2019 1005    Physical Exam:    VS:  There were no vitals taken for this visit.    Wt Readings from Last 3 Encounters:  06/20/20 180 lb (81.6 kg)  03/26/20 179 lb 6.4 oz (81.4 kg)  03/04/20 173 lb (78.5 kg)     GEN: Mildly overweight. No acute distress HEENT: Normal NECK: No JVD. LYMPHATICS: No lymphadenopathy CARDIAC:  RRR without murmur, gallop, or edema. VASCULAR:  Normal Pulses. No bruits. RESPIRATORY:  Clear to auscultation without rales, wheezing or rhonchi  ABDOMEN: Soft, non-tender, non-distended, No pulsatile mass, MUSCULOSKELETAL: No deformity  SKIN: Warm and dry NEUROLOGIC:  Alert and oriented x 3 PSYCHIATRIC:  Normal affect   ASSESSMENT:    1. Coronary artery calcification seen on CT scan   2. Essential hypertension   3. OSA (obstructive sleep apnea)   4. Morbid obesity ([place])   5. Precordial pain   6. Educated about COVID-19 virus infection    PLAN:    In order of problems listed above:  1. Coronary artery disease without significant obstruction documented by prior CT.  She did have angina.  Difficult to tell if this was related to stress and possibly vasoconstriction or other.  Her EKG today is normal  appearing so she clearly did not have a myocardial infarction.  She now feels better and is back to baseline.  She has been walking without limitations.  Continue aspirin daily.  Use nitroglycerin if the pain recurs.  Report nitroglycerin use 2 months. 2. Continue Hyzaar 50/12.5 mg daily. 3. Compliance with CPAP is recommended. 4. Continue exercise and attempt to lose weight. 5. This recent episode of chest pain is of uncertain etiology in the setting of severe back pain.  Possibly a stress cardiomyopathy like syndrome.  Plan now is clinical observation without further work-up. 6. Has not  had Covid and feels better about the pandemic.  She is vaccinated.  14-month follow-up.  Call if chest pain.     Medication Adjustments/Labs and Tests Ordered: Current medicines are reviewed at length with the patient today.  Concerns regarding medicines are outlined above.  No orders of the defined types were placed in this encounter.  No orders of the defined types were placed in this encounter.   There are no Patient Instructions on file for this visit.   Signed, Sinclair Grooms, MD  06/27/2020 1:19 PM    [facility]

## 2020-06-27 NOTE — Patient Instructions (Signed)
Medication Instructions:  Your physician recommends that you continue on your current medications as directed. Please refer to the Current Medication list given to you today.  *If you need a refill on your cardiac medications before your next appointment, please call your pharmacy*   Lab Work: None If you have labs (blood work) drawn today and your tests are completely normal, you will receive your results only by: . MyChart Message (if you have MyChart) OR . A paper copy in the mail If you have any lab test that is abnormal or we need to change your treatment, we will call you to review the results.   Testing/Procedures: None   Follow-Up: At [facility], you and your health needs are our priority.  As part of our continuing mission to provide you with exceptional heart care, we have created designated Provider Care Teams.  These Care Teams include your primary Cardiologist (physician) and Advanced Practice Providers (APPs -  Physician Assistants and Nurse Practitioners) who all work together to provide you with the care you need, when you need it.  We recommend signing up for the patient portal called "MyChart".  Sign up information is provided on this After Visit Summary.  MyChart is used to connect with patients for Virtual Visits (Telemedicine).  Patients are able to view lab/test results, encounter notes, upcoming appointments, etc.  Non-urgent messages can be sent to your provider as well.   To learn more about what you can do with MyChart, go to [email or web address].    Your next appointment:   6 month(s)  The format for your next appointment:   In Person  Provider:   You may see Henry W Meegan III, MD or one of the following Advanced Practice Providers on your designated Care Team:    Lori Gerhardt, NP  Laura Ingold, NP  Jill McDaniel, NP    Other Instructions   

## 2020-06-30 ENCOUNTER — Telehealth: Payer: Medicare Other | Admitting: Cardiology

## 2020-07-08 ENCOUNTER — Telehealth: Payer: Self-pay | Admitting: Interventional Cardiology

## 2020-07-08 NOTE — Telephone Encounter (Signed)
Reached out to patient to offer assistance and encouraged her to call her insurance carrier to see if there has been a change since [employer] is saying her insurance is requiring her to pay and she has not had to pay in the past and now she has to pay $30 a month.  Pt is aware and agreeable to treatment.

## 2020-07-08 NOTE — Telephone Encounter (Signed)
Patient  states she only has one CPAP mask remaining and she is requesting assistance with obtaining a replacement. She states she called a company, assuming they would provide assistance, however she was being denied. Please return call to discuss.

## 2020-07-29 ENCOUNTER — Other Ambulatory Visit: Payer: Self-pay | Admitting: Family Medicine

## 2020-07-29 DIAGNOSIS — Z1231 Encounter for screening mammogram for malignant neoplasm of breast: Secondary | ICD-10-CM

## 2020-09-09 ENCOUNTER — Ambulatory Visit
Admission: RE | Admit: 2020-09-09 | Discharge: 2020-09-09 | Disposition: A | Discharge status: Home / Self Care | Payer: Medicare Other | Source: Ambulatory Visit | Attending: Family Medicine | Admitting: Family Medicine

## 2020-09-09 ENCOUNTER — Other Ambulatory Visit: Payer: Self-pay

## 2020-09-09 DIAGNOSIS — Z1231 Encounter for screening mammogram for malignant neoplasm of breast: Secondary | ICD-10-CM

## 2020-09-11 ENCOUNTER — Other Ambulatory Visit: Payer: Self-pay | Admitting: Family Medicine

## 2020-09-11 DIAGNOSIS — R928 Other abnormal and inconclusive findings on diagnostic imaging of breast: Secondary | ICD-10-CM

## 2020-09-30 ENCOUNTER — Other Ambulatory Visit: Payer: Medicare Other

## 2020-10-24 ENCOUNTER — Other Ambulatory Visit: Payer: Self-pay

## 2020-10-24 ENCOUNTER — Ambulatory Visit
Admission: RE | Admit: 2020-10-24 | Discharge: 2020-10-24 | Disposition: A | Discharge status: Home / Self Care | Payer: Medicare Other | Source: Ambulatory Visit | Attending: Family Medicine | Admitting: Family Medicine

## 2020-10-24 ENCOUNTER — Ambulatory Visit: Payer: Medicare Other

## 2020-10-24 DIAGNOSIS — R928 Other abnormal and inconclusive findings on diagnostic imaging of breast: Secondary | ICD-10-CM

## 2020-11-26 DIAGNOSIS — M797 Fibromyalgia: Secondary | ICD-10-CM | POA: Insufficient documentation

## 2020-12-09 ENCOUNTER — Other Ambulatory Visit: Payer: Self-pay

## 2020-12-09 ENCOUNTER — Encounter (HOSPITAL_COMMUNITY): Payer: Self-pay

## 2020-12-09 ENCOUNTER — Inpatient Hospital Stay (HOSPITAL_COMMUNITY)
Admission: EM | Admit: 2020-12-09 | Discharge: 2020-12-11 | DRG: 378 | Disposition: A | Discharge status: Home / Self Care | Payer: Medicare Other | Attending: Internal Medicine | Admitting: Internal Medicine

## 2020-12-09 DIAGNOSIS — Z79899 Other long term (current) drug therapy: Secondary | ICD-10-CM | POA: Diagnosis not present

## 2020-12-09 DIAGNOSIS — D62 Acute posthemorrhagic anemia: Secondary | ICD-10-CM | POA: Diagnosis present

## 2020-12-09 DIAGNOSIS — I10 Essential (primary) hypertension: Secondary | ICD-10-CM | POA: Diagnosis present

## 2020-12-09 DIAGNOSIS — K5730 Diverticulosis of large intestine without perforation or abscess without bleeding: Secondary | ICD-10-CM | POA: Diagnosis present

## 2020-12-09 DIAGNOSIS — E669 Obesity, unspecified: Secondary | ICD-10-CM | POA: Diagnosis present

## 2020-12-09 DIAGNOSIS — Z20822 Contact with and (suspected) exposure to covid-19: Secondary | ICD-10-CM | POA: Diagnosis present

## 2020-12-09 DIAGNOSIS — D123 Benign neoplasm of transverse colon: Secondary | ICD-10-CM | POA: Diagnosis not present

## 2020-12-09 DIAGNOSIS — K648 Other hemorrhoids: Secondary | ICD-10-CM | POA: Diagnosis not present

## 2020-12-09 DIAGNOSIS — I252 Old myocardial infarction: Secondary | ICD-10-CM | POA: Diagnosis not present

## 2020-12-09 DIAGNOSIS — Z90710 Acquired absence of both cervix and uterus: Secondary | ICD-10-CM | POA: Diagnosis not present

## 2020-12-09 DIAGNOSIS — F32A Depression, unspecified: Secondary | ICD-10-CM | POA: Diagnosis present

## 2020-12-09 DIAGNOSIS — E876 Hypokalemia: Secondary | ICD-10-CM | POA: Diagnosis not present

## 2020-12-09 DIAGNOSIS — Z8711 Personal history of peptic ulcer disease: Secondary | ICD-10-CM | POA: Diagnosis not present

## 2020-12-09 DIAGNOSIS — Z6830 Body mass index (BMI) 30.0-30.9, adult: Secondary | ICD-10-CM

## 2020-12-09 DIAGNOSIS — R109 Unspecified abdominal pain: Secondary | ICD-10-CM | POA: Diagnosis present

## 2020-12-09 DIAGNOSIS — K921 Melena: Secondary | ICD-10-CM | POA: Diagnosis not present

## 2020-12-09 DIAGNOSIS — Z8249 Family history of ischemic heart disease and other diseases of the circulatory system: Secondary | ICD-10-CM

## 2020-12-09 DIAGNOSIS — I2510 Atherosclerotic heart disease of native coronary artery without angina pectoris: Secondary | ICD-10-CM | POA: Diagnosis not present

## 2020-12-09 DIAGNOSIS — Z7982 Long term (current) use of aspirin: Secondary | ICD-10-CM

## 2020-12-09 DIAGNOSIS — K922 Gastrointestinal hemorrhage, unspecified: Secondary | ICD-10-CM | POA: Diagnosis present

## 2020-12-09 DIAGNOSIS — Z531 Procedure and treatment not carried out because of patient's decision for reasons of belief and group pressure: Secondary | ICD-10-CM | POA: Diagnosis not present

## 2020-12-09 LAB — COMPREHENSIVE METABOLIC PANEL
ALT: 17 U/L (ref 0–44)
AST: 24 U/L (ref 15–41)
Albumin: 3.7 g/dL (ref 3.5–5.0)
Alkaline Phosphatase: 46 U/L (ref 38–126)
Anion gap: 6 (ref 5–15)
BUN: 7 mg/dL — ABNORMAL LOW (ref 8–23)
CO2: 28 mmol/L (ref 22–32)
Calcium: 9.1 mg/dL (ref 8.9–10.3)
Chloride: 105 mmol/L (ref 98–111)
Creatinine, Ser: 0.94 mg/dL (ref 0.44–1.00)
GFR, Estimated: >60 mL/min (ref >60)
Glucose, Bld: 178 mg/dL — ABNORMAL HIGH (ref 70–99)
Potassium: 3.4 mmol/L — ABNORMAL LOW (ref 3.5–5.1)
Sodium: 139 mmol/L (ref 135–145)
Total Bilirubin: 0.5 mg/dL (ref 0.3–1.2)
Total Protein: 6.9 g/dL (ref 6.5–8.1)

## 2020-12-09 LAB — CBC
HCT: 30.8 % — ABNORMAL LOW (ref 36.0–46.0)
Hemoglobin: 9 g/dL — ABNORMAL LOW (ref 12.0–15.0)
MCH: 23.8 pg — ABNORMAL LOW (ref 26.0–34.0)
MCHC: 29.2 g/dL — ABNORMAL LOW (ref 30.0–36.0)
MCV: 81.5 fL (ref 80.0–100.0)
Platelets: 411 10*3/uL — ABNORMAL HIGH (ref 150–400)
RBC: 3.78 MIL/uL — ABNORMAL LOW (ref 3.87–5.11)
RDW: 15.5 % (ref 11.5–15.5)
WBC: 4.8 10*3/uL (ref 4.0–10.5)
nRBC: 0 % (ref 0.0–0.2)

## 2020-12-09 LAB — PROTIME-INR
INR: 1 (ref 0.8–1.2)
Prothrombin Time: 13.2 seconds (ref 11.4–15.2)

## 2020-12-09 LAB — RESP PANEL BY RT-PCR (FLU A&B, COVID) ARPGX2
Influenza A by PCR: NEGATIVE
Influenza B by PCR: NEGATIVE
SARS Coronavirus 2 by RT PCR: NEGATIVE

## 2020-12-09 MED ORDER — PANTOPRAZOLE SODIUM 40 MG IV SOLR
40.0000 mg | Freq: Two times a day (BID) | INTRAVENOUS | Status: DC
  Administered 2020-12-09 – 2020-12-10 (×2): 40 mg via INTRAVENOUS
  Filled 2020-12-09 (×2): qty 40

## 2020-12-09 MED ORDER — PANTOPRAZOLE SODIUM 40 MG IV SOLR
40.0000 mg | INTRAVENOUS | Status: AC
  Administered 2020-12-09: 40 mg via INTRAVENOUS
  Filled 2020-12-09: qty 40

## 2020-12-09 MED ORDER — SODIUM CHLORIDE 0.9 % IV SOLN: INTRAVENOUS | Status: DC

## 2020-12-09 MED ORDER — TRAMADOL HCL 50 MG PO TABS
50.0000 mg | ORAL_TABLET | Freq: Four times a day (QID) | ORAL | Status: DC | PRN
  Administered 2020-12-10 – 2020-12-11 (×2): 50 mg via ORAL
  Filled 2020-12-09 (×2): qty 1

## 2020-12-09 MED ORDER — POLYETHYLENE GLYCOL 3350 17 G PO PACK: 17.0000 g | PACK | Freq: Every day | ORAL | Status: DC | PRN

## 2020-12-09 MED ORDER — ACETAMINOPHEN 650 MG RE SUPP: 650.0000 mg | Freq: Four times a day (QID) | RECTAL | Status: DC | PRN

## 2020-12-09 MED ORDER — ONDANSETRON HCL 4 MG/2ML IJ SOLN: 4.0000 mg | Freq: Four times a day (QID) | INTRAMUSCULAR | Status: DC | PRN

## 2020-12-09 MED ORDER — DULOXETINE HCL 30 MG PO CPEP
30.0000 mg | ORAL_CAPSULE | Freq: Every day | ORAL | Status: DC
  Administered 2020-12-10 – 2020-12-11 (×2): 30 mg via ORAL
  Filled 2020-12-09 (×3): qty 1

## 2020-12-09 MED ORDER — ONDANSETRON HCL 4 MG PO TABS: 4.0000 mg | ORAL_TABLET | Freq: Four times a day (QID) | ORAL | Status: DC | PRN

## 2020-12-09 MED ORDER — LOSARTAN POTASSIUM 50 MG PO TABS
50.0000 mg | ORAL_TABLET | Freq: Every day | ORAL | Status: DC
  Administered 2020-12-10 – 2020-12-11 (×2): 50 mg via ORAL
  Filled 2020-12-09 (×4): qty 1

## 2020-12-09 MED ORDER — ACETAMINOPHEN 325 MG PO TABS
650.0000 mg | ORAL_TABLET | Freq: Four times a day (QID) | ORAL | Status: DC | PRN
  Administered 2020-12-10: 650 mg via ORAL
  Filled 2020-12-09: qty 2

## 2020-12-09 NOTE — ED Provider Notes (Signed)
[facility] EMERGENCY DEPARTMENT Provider Note   CSN: [id number] Arrival date & time: 12/09/20  1304     History No chief complaint on file.   Tanya Walsh is a 65 y.o. female.  HPI   This patient is a 65 year old female with a history of hypertension, prior history of myocardial infarction and a history of some gastrointestinal bleeding in the past in fact my review of the medical record shows that in 2016 she underwent an upper endoscopy where she was found to have a very small duodenal ulcer, she also had findings consistent with erosive gastritis.  She has been intermittently taking proton pump inhibitors over time mostly when she feels like she needs it but over the last month has had a rather persistent and worsening pain, this seems to get much worse when she eats and about 3 weeks ago she noticed having some black stools.  She went to her gastroenterologist and was advised to take the Protonix daily which she has been doing, she stopped taking aspirin about 4 days ago and noticed that the stools started to lighten back to a brownish color but still with some dark nature to them.  Coexisting with this is her lightheadedness, shortness of breath with exertion, generalized weakness which seems to get worse.  She is not vomiting but has had no nosebleeds, she has had very little to eat the last couple of days because of the increase in pain when she does.  She went to the gastroenterologist today who redirected her to the emergency department for further evaluation.  She was told that she had a hemoglobin around 8.  Review of the medical record again shows that her last hemoglobin 5 months ago was over 11  Past Medical History:  Diagnosis Date  . Coronary artery disease   . Depression   . GI bleed 07/17/2015  . Hypertension   . Myocardial infarction ([place])   . Nocturia 08/14/2019  . Refusal of blood transfusions as patient is Jehovah's Witness     Patient  Active Problem List   Diagnosis Date Noted  . Nocturia 08/14/2019  . Melena 07/17/2015  . Incidental lung nodule, less than or equal to 75mm 07/17/2015  . Chest pain at rest 04/16/2014  . Essential hypertension 04/16/2014  . Morbid obesity ([place]) 04/16/2014  . Family history of early CAD 04/16/2014    Past Surgical History:  Procedure Laterality Date  . ABDOMINAL HYSTERECTOMY    . BACK SURGERY    . ESOPHAGOGASTRODUODENOSCOPY N/A 07/17/2015   Procedure: ESOPHAGOGASTRODUODENOSCOPY (EGD);  Surgeon: Wonda Horner, MD;  Location: [facility] ENDOSCOPY;  Service: Endoscopy;  Laterality: N/A;     OB History   No obstetric history on file.     Family History  Problem Relation Age of Onset  . Heart disease Father   . Heart disease Brother     Social History   Tobacco Use  . Smoking status: Never Smoker  . Smokeless tobacco: Never Used  Vaping Use  . Vaping Use: Never used  Substance Use Topics  . Alcohol use: Yes    Comment: rare  . Drug use: No    Home Medications Prior to Admission medications   Medication Sig Start Date End Date Taking? Authorizing Provider  aspirin EC 81 MG tablet Take 81 mg by mouth daily.    [provider]  DULoxetine (CYMBALTA) 30 MG capsule Take 30 mg by mouth daily. 03/05/20   [provider]  losartan-hydrochlorothiazide (  HYZAAR) 50-12.5 MG tablet Take 1 tablet by mouth daily. 03/21/18   [provider]  nitroGLYCERIN (NITROSTAT) 0.4 MG SL tablet Place 1 tablet (0.4 mg total) under the tongue every 5 (five) minutes as needed for chest pain. 9/52/84   Delora Fuel, MD  pantoprazole (PROTONIX) 40 MG tablet Take 40 mg by mouth as needed.    [provider]  rosuvastatin (CRESTOR) 20 MG tablet Take 1 tablet (20 mg total) by mouth daily. 11/08/19 06/27/20  Belva Crome, MD    Allergies    Patient has no known allergies.  Review of Systems   Review of Systems  All other systems reviewed and are negative.   Physical  Exam Updated Vital Signs BP (!) 150/70   Pulse 63   Temp 98.6 F (37 C)   Resp 20   SpO2 100%   Physical Exam Vitals and nursing note reviewed.  Constitutional:      General: She is not in acute distress.    Appearance: She is well-developed.  HENT:     Head: Normocephalic and atraumatic.     Mouth/Throat:     Pharynx: No oropharyngeal exudate.  Eyes:     General: No scleral icterus.       Right eye: No discharge.        Left eye: No discharge.     Conjunctiva/sclera: Conjunctivae normal.     Pupils: Pupils are equal, round, and reactive to light.     Comments: Slightly pale conjunctive a  Neck:     Thyroid: No thyromegaly.     Vascular: No JVD.  Cardiovascular:     Rate and Rhythm: Normal rate and regular rhythm.     Heart sounds: Normal heart sounds. No murmur heard. No friction rub. No gallop.   Pulmonary:     Effort: Pulmonary effort is normal. No respiratory distress.     Breath sounds: Normal breath sounds. No wheezing or rales.  Abdominal:     General: Bowel sounds are normal. There is no distension.     Palpations: Abdomen is soft. There is no mass.     Tenderness: There is abdominal tenderness.     Comments: Diffuse mild abdominal tenderness without any guarding, this is worse in the epigastrium, better in the lower abdomen.  Musculoskeletal:        General: No tenderness. Normal range of motion.     Cervical back: Normal range of motion and neck supple.  Lymphadenopathy:     Cervical: No cervical adenopathy.  Skin:    General: Skin is warm and dry.     Findings: No erythema or rash.  Neurological:     Mental Status: She is alert.     Coordination: Coordination normal.  Psychiatric:        Behavior: Behavior normal.     ED Results / Procedures / Treatments   Labs (all labs ordered are listed, but only abnormal results are displayed) Labs Reviewed  COMPREHENSIVE METABOLIC PANEL - Abnormal; Notable for the following components:      Result Value    Potassium 3.4 (*)    Glucose, Bld 178 (*)    BUN 7 (*)    All other components within normal limits  CBC - Abnormal; Notable for the following components:   RBC 3.78 (*)    Hemoglobin 9.0 (*)    HCT 30.8 (*)    MCH 23.8 (*)    MCHC 29.2 (*)    Platelets 411 (*)  All other components within normal limits  PROTIME-INR  POC OCCULT BLOOD, ED    EKG None  Radiology No results found.  Procedures Procedures   Medications Ordered in ED Medications  0.9 %  sodium chloride infusion ( Intravenous New Bag/Given 12/09/20 1543)  pantoprazole (PROTONIX) injection 40 mg (40 mg Intravenous Given 12/09/20 1543)    ED Course  I have reviewed the triage vital signs and the nursing notes.  Pertinent labs & imaging results that were available during my care of the patient were reviewed by me and considered in my medical decision making (see chart for details).  Clinical Course as of 12/09/20 1629  Tue Dec 09, 2020  1612 I discussed the case with Dr. Therisa Doyne at 4:10 PM, she will see the patient in consultation and recommends an EGD to be done in the morning, n.p.o. after midnight. Have paged for unassigned medical admission, the patient is a patient of [facility] for primary care physician  [BM]  1627 Discussed with Dr. Thereasa Solo of the hospital service was been kind of to admit the patient to hospital [BM]    Clinical Course User Index [BM] Noemi Chapel, MD   MDM Rules/Calculators/A&P                          The patient is Jehovah witness and refuses blood transfusion, that being said at this time I do not think it would be necessary anyway.  She has had a several gram drop in her hemoglobin, she has some pale conjunctive and symptoms consistent with a symptomatic anemia.  The black tarry stools in the history of erosive gastritis suggest a history of recurrent gastrointestinal bleeding, she is no longer taking aspirin however the lasting effects of aspirin will continue for some time.  I  believe she would be benefited from repeat upper endoscopy and thus will consult with gastroenterology.  The patient is agreeable to the plan, IV fluids will be started, Protonix, n.p.o.  Final Clinical Impression(s) / ED Diagnoses Final diagnoses:  UGI bleed    Rx / DC Orders   Noemi Chapel, MD 12/09/20 [id number]

## 2020-12-09 NOTE — H&P (Signed)
History and Physical   SPR[place] QZE:[id number] DOB: November 01, 1955 DOA: 12/09/2020  PCP: Katherina Mires, MD Patient coming from: home  Chief Complaint: abdominal pain - ?GIB  HPI:  65yo Jehovah's witness w/ a hx of HTN, depression, and prior GIB due to erosive gastritis and a small duodenal ulcer (EGD 2016) who was directed to the ED by her PCP after she was found to have a Hgb of 8 (from a baseline of 11.5) in the setting of cramping abdom pain and intermittent melena for at least the last 2 weeks. She admitted to only intermittent use of her protonix, and also intermittent use of aleve for pain, on top of her daily ASA 81mg .  Additionally she reports approximately 10 days of significant dyspnea on exertion with poor exertional tolerance.  She denies substernal chest pressure, nausea, vomiting, or BRBPR.   Assessment/Plan  Suspected UGIB  Likely a recurrence of her previously noted gastritis plus/minus ulcer -for EGD in a.m. -BUN curiously not elevated, perhaps because this has been intermittent subacute bleeding issue -Protonix IV every 12 hours for now -clear liquids only until midnight  Acute/subacute blood loss anemia  Based upon her religious believes the patient will not consider blood transfusion or use of other human blood products -she tells me she is open to the use of iron if indicated -iron panel pending  Mild hypokalemia  Likely related to poor intake -supplement -check magnesium  HTN Blood pressure reasonably well-controlled at this time -hold diuretic but continue ARB and follow  DVT prophylaxis: SCDs Code Status: FULL Family Communication: No family present at time of exam Disposition Plan: admit to med surg bed as inpatient Consults called: [facility] consulted in ED - planning EGD 3/30   Review of Systems: As per HPI otherwise 10 point review of systems negative.   Past Medical History:  Diagnosis Date  . Depression   . GI bleed 07/17/2015  . Hypertension    . Nocturia 08/14/2019  . Refusal of blood transfusions as patient is Jehovah's Witness     Past Surgical History:  Procedure Laterality Date  . ABDOMINAL HYSTERECTOMY    . BACK SURGERY    . ESOPHAGOGASTRODUODENOSCOPY N/A 07/17/2015   Procedure: ESOPHAGOGASTRODUODENOSCOPY (EGD);  Surgeon: Wonda Horner, MD;  Location: [facility] ENDOSCOPY;  Service: Endoscopy;  Laterality: N/A;    Family History  Family History  Problem Relation Age of Onset  . Heart disease Father   . Heart disease Brother     Social History   reports that she has never smoked. She has never used smokeless tobacco. She reports current alcohol use. She reports that she does not use drugs.  Allergies No Known Allergies  Prior to Admission medications   Medication Sig Start Date End Date Taking? Authorizing Provider  aspirin EC 81 MG tablet Take 81 mg by mouth daily.    [provider]  DULoxetine (CYMBALTA) 30 MG capsule Take 30 mg by mouth daily. 03/05/20   [provider]  losartan-hydrochlorothiazide (HYZAAR) 50-12.5 MG tablet Take 1 tablet by mouth daily. 03/21/18   [provider]  nitroGLYCERIN (NITROSTAT) 0.4 MG SL tablet Place 1 tablet (0.4 mg total) under the tongue every 5 (five) minutes as needed for chest pain. 11/09/31   Delora Fuel, MD  pantoprazole (PROTONIX) 40 MG tablet Take 40 mg by mouth as needed.    [provider]  rosuvastatin (CRESTOR) 20 MG tablet Take 1 tablet (20 mg total) by mouth daily. 11/08/19 06/27/20  Belva Crome, MD    Physical Exam: Vitals:   12/09/20 1311 12/09/20 1523 12/09/20 1600  BP: (!) 185/97 (!) 167/89 (!) 150/70  Pulse: 87 75 63  Resp: 18 17 20   Temp: 98.6 F (37 C)    SpO2: 100% 98% 100%    Constitutional: NAD, calm, comfortable ENMT: Mucous membranes are moist.  Neck: normal, supple Respiratory: clear to auscultation bilaterally, no wheezing, no crackles. Normal respiratory effort. No accessory muscle use.  Cardiovascular: Regular  rate and rhythm, no murmurs / rubs / gallops. No extremity edema. Abdomen: Mildly tender to deep palpation bilateral upper quadrant/epigastrium, bowel sounds positive, no rebound, no mass, soft Musculoskeletal: no clubbing / cyanosis. No joint deformity upper and lower extremities.  Skin: no rashes, lesions, ulcers. No induration Neurologic: CN 2-12 grossly intact. Sensation intact Psychiatric: Normal judgment and insight. Alert and oriented x 3. Normal mood.    Labs on Admission:   CBC: Recent Labs  Lab 12/09/20 1324  WBC 4.8  HGB 9.0*  HCT 30.8*  MCV 81.5  PLT 629*   Basic Metabolic Panel: Recent Labs  Lab 12/09/20 1324  NA 139  K 3.4*  CL 105  CO2 28  GLUCOSE 178*  BUN 7*  CREATININE 0.94  CALCIUM 9.1   GFR: CrCl cannot be calculated (Unknown ideal weight.).   Liver Function Tests: Recent Labs  Lab 12/09/20 1324  AST 24  ALT 17  ALKPHOS 46  BILITOT 0.5  PROT 6.9  ALBUMIN 3.7   Urine analysis:    Component Value Date/Time   COLORURINE COLORLESS (A) 03/04/2020 1143   APPEARANCEUR CLEAR 03/04/2020 1143   LABSPEC 1.005 03/04/2020 1143   PHURINE 7.0 03/04/2020 1143   GLUCOSEU NEGATIVE 03/04/2020 1143   [place] 03/04/2020 1143   [place] 03/04/2020 1143   KETONESUR NEGATIVE 03/04/2020 1143   PROTEINUR NEGATIVE 03/04/2020 1143   UROBILINOGEN 0.2 07/17/2015 0813   NITRITE NEGATIVE 03/04/2020 1143   LEUKOCYTESUR NEGATIVE 03/04/2020 1143    Radiological Exams on Admission: No results found.   Cherene Altes, MD [facility] Office  [phone] Pager - Text Page per Amion as per below:  On-Call/Text Page:      [email or web address]  If 7PM-7AM, please contact night-coverage [email or web address] 12/09/2020, 4:24 PM

## 2020-12-09 NOTE — ED Notes (Addendum)
Ambulatory to bathroom.

## 2020-12-09 NOTE — ED Triage Notes (Signed)
Patient was called by her MD for Hgb of 8. Has had abdominal pain for 1 week and dark stools the past few days. Patient states she will NOT accept blood transfusion

## 2020-12-09 NOTE — ED Notes (Signed)
BLOOD REFUSAL CONSENT SIGNED

## 2020-12-10 ENCOUNTER — Inpatient Hospital Stay (HOSPITAL_COMMUNITY): Payer: Medicare Other | Admitting: Anesthesiology

## 2020-12-10 ENCOUNTER — Encounter (HOSPITAL_COMMUNITY): Payer: Self-pay | Admitting: Internal Medicine

## 2020-12-10 ENCOUNTER — Encounter (HOSPITAL_COMMUNITY)
Admission: EM | Disposition: A | Discharge status: Home / Self Care | Payer: Self-pay | Source: Home / Self Care | Attending: Internal Medicine

## 2020-12-10 DIAGNOSIS — K921 Melena: Principal | ICD-10-CM

## 2020-12-10 HISTORY — PX: BIOPSY: SHX5522

## 2020-12-10 HISTORY — PX: ESOPHAGOGASTRODUODENOSCOPY (EGD) WITH PROPOFOL: SHX5813

## 2020-12-10 HISTORY — PX: HEMOSTASIS CLIP PLACEMENT: SHX6857

## 2020-12-10 LAB — IRON AND TIBC
Iron: 16 ug/dL — ABNORMAL LOW (ref 28–170)
Saturation Ratios: 4 % — ABNORMAL LOW (ref 10.4–31.8)
TIBC: 391 ug/dL (ref 250–450)
UIBC: 375 ug/dL

## 2020-12-10 LAB — CBC
HCT: 27.1 % — ABNORMAL LOW (ref 36.0–46.0)
Hemoglobin: 8 g/dL — ABNORMAL LOW (ref 12.0–15.0)
MCH: 23.6 pg — ABNORMAL LOW (ref 26.0–34.0)
MCHC: 29.5 g/dL — ABNORMAL LOW (ref 30.0–36.0)
MCV: 79.9 fL — ABNORMAL LOW (ref 80.0–100.0)
Platelets: 346 10*3/uL (ref 150–400)
RBC: 3.39 MIL/uL — ABNORMAL LOW (ref 3.87–5.11)
RDW: 15.4 % (ref 11.5–15.5)
WBC: 4.2 10*3/uL (ref 4.0–10.5)
nRBC: 0 % (ref 0.0–0.2)

## 2020-12-10 LAB — COMPREHENSIVE METABOLIC PANEL
ALT: 15 U/L (ref 0–44)
AST: 19 U/L (ref 15–41)
Albumin: 3.2 g/dL — ABNORMAL LOW (ref 3.5–5.0)
Alkaline Phosphatase: 44 U/L (ref 38–126)
Anion gap: 5 (ref 5–15)
BUN: 5 mg/dL — ABNORMAL LOW (ref 8–23)
CO2: 27 mmol/L (ref 22–32)
Calcium: 8.7 mg/dL — ABNORMAL LOW (ref 8.9–10.3)
Chloride: 105 mmol/L (ref 98–111)
Creatinine, Ser: 0.84 mg/dL (ref 0.44–1.00)
GFR, Estimated: >60 mL/min (ref >60)
Glucose, Bld: 95 mg/dL (ref 70–99)
Potassium: 3.4 mmol/L — ABNORMAL LOW (ref 3.5–5.1)
Sodium: 137 mmol/L (ref 135–145)
Total Bilirubin: 0.6 mg/dL (ref 0.3–1.2)
Total Protein: 5.9 g/dL — ABNORMAL LOW (ref 6.5–8.1)

## 2020-12-10 LAB — RETICULOCYTES
Immature Retic Fract: 25.2 % — ABNORMAL HIGH (ref 2.3–15.9)
RBC.: 3.33 MIL/uL — ABNORMAL LOW (ref 3.87–5.11)
Retic Count, Absolute: 64.9 10*3/uL (ref 19.0–186.0)
Retic Ct Pct: 2 % (ref 0.4–3.1)

## 2020-12-10 LAB — VITAMIN B12: Vitamin B-12: 651 pg/mL (ref 180–914)

## 2020-12-10 LAB — FERRITIN: Ferritin: 5 ng/mL — ABNORMAL LOW (ref 11–307)

## 2020-12-10 LAB — NO BLOOD PRODUCTS

## 2020-12-10 LAB — FOLATE: Folate: 39.2 ng/mL (ref >5.9)

## 2020-12-10 LAB — HIV ANTIBODY (ROUTINE TESTING W REFLEX): HIV Screen 4th Generation wRfx: NONREACTIVE

## 2020-12-10 SURGERY — ESOPHAGOGASTRODUODENOSCOPY (EGD) WITH PROPOFOL
Anesthesia: Monitor Anesthesia Care

## 2020-12-10 MED ORDER — SODIUM CHLORIDE 0.9 % IV SOLN: INTRAVENOUS | Status: DC

## 2020-12-10 MED ORDER — PEG 3350-KCL-NA BICARB-NACL 420 G PO SOLR
2000.0000 mL | Freq: Once | ORAL | Status: AC
  Administered 2020-12-11: 2000 mL via ORAL

## 2020-12-10 MED ORDER — PEG 3350-KCL-NA BICARB-NACL 420 G PO SOLR
2000.0000 mL | Freq: Once | ORAL | Status: AC
  Administered 2020-12-10: 2000 mL via ORAL
  Filled 2020-12-10: qty 4000

## 2020-12-10 MED ORDER — CYCLOBENZAPRINE HCL 10 MG PO TABS
5.0000 mg | ORAL_TABLET | Freq: Three times a day (TID) | ORAL | Status: DC | PRN
  Administered 2020-12-10: 5 mg via ORAL
  Filled 2020-12-10: qty 1

## 2020-12-10 MED ORDER — PEG 3350-KCL-NA BICARB-NACL 420 G PO SOLR: 4000.0000 mL | Freq: Once | ORAL | Status: DC

## 2020-12-10 MED ORDER — LIDOCAINE 2% (20 MG/ML) 5 ML SYRINGE
INTRAMUSCULAR | Status: DC | PRN
  Administered 2020-12-10 (×2): 50 mg via INTRAVENOUS

## 2020-12-10 MED ORDER — PROPOFOL 500 MG/50ML IV EMUL
INTRAVENOUS | Status: DC | PRN
  Administered 2020-12-10: 100 ug/kg/min via INTRAVENOUS

## 2020-12-10 MED ORDER — SODIUM CHLORIDE 0.9 % IV SOLN
510.0000 mg | Freq: Once | INTRAVENOUS | Status: AC
  Administered 2020-12-10: 510 mg via INTRAVENOUS
  Filled 2020-12-10: qty 17

## 2020-12-10 MED ORDER — ROSUVASTATIN CALCIUM 20 MG PO TABS
20.0000 mg | ORAL_TABLET | Freq: Every day | ORAL | Status: DC
  Administered 2020-12-10: 20 mg via ORAL
  Filled 2020-12-10: qty 1

## 2020-12-10 MED ORDER — PANTOPRAZOLE SODIUM 40 MG IV SOLR: 40.0000 mg | Freq: Every day | INTRAVENOUS | Status: DC

## 2020-12-10 MED ORDER — LACTATED RINGERS IV SOLN: INTRAVENOUS | Status: DC | PRN

## 2020-12-10 SURGICAL SUPPLY — 15 items

## 2020-12-10 NOTE — Anesthesia Preprocedure Evaluation (Addendum)
Anesthesia Evaluation  Patient identified by MRN, date of birth, ID band Patient awake    Reviewed: Allergy & Precautions, NPO status , Patient's Chart, lab work & pertinent test results, reviewed documented beta blocker date and time   Airway Mallampati: III  TM Distance: >3 FB Neck ROM: Full    Dental  (+) Chipped, Caps,    Pulmonary neg pulmonary ROS,    Pulmonary exam normal breath sounds clear to auscultation       Cardiovascular hypertension, Pt. on medications + CAD and + Past MI  Normal cardiovascular exam Rhythm:Regular Rate:Normal     Neuro/Psych PSYCHIATRIC DISORDERS Depression negative neurological ROS     GI/Hepatic Neg liver ROS, Melena  GI Bleed   Endo/Other  Hyperlipidemia Obesity  Renal/GU negative Renal ROS  negative genitourinary   Musculoskeletal   Abdominal (+) + obese,   Peds  Hematology  (+) anemia , REFUSES BLOOD PRODUCTS, JEHOVAH'S WITNESS  Anesthesia Other Findings   Reproductive/Obstetrics                           Anesthesia Physical Anesthesia Plan  ASA: III  Anesthesia Plan: MAC   Post-op Pain Management:    Induction: Intravenous  PONV Risk Score and Plan: 2 and Propofol infusion and Treatment may vary due to age or medical condition  Airway Management Planned: Natural Airway and Nasal Cannula  Additional Equipment:   Intra-op Plan:   Post-operative Plan:   Informed Consent: I have reviewed the patients History and Physical, chart, labs and discussed the procedure including the risks, benefits and alternatives for the proposed anesthesia with the patient or authorized representative who has indicated his/her understanding and acceptance.     Dental advisory given  Plan Discussed with: Anesthesiologist and CRNA  Anesthesia Plan Comments:         Anesthesia Quick Evaluation

## 2020-12-10 NOTE — Transfer of Care (Signed)
Immediate Anesthesia Transfer of Care Note  Patient: Tanya Walsh  Procedure(s) Performed: ESOPHAGOGASTRODUODENOSCOPY (EGD) WITH PROPOFOL (N/A ) BIOPSY HEMOSTASIS CLIP PLACEMENT  Patient Location: PACU and Endoscopy Unit  Anesthesia Type:MAC  Level of Consciousness: patient cooperative and responds to stimulation  Airway & Oxygen Therapy: Patient Spontanous Breathing and Patient connected to nasal cannula oxygen  Post-op Assessment: Report given to RN and Post -op Vital signs reviewed and stable  Post vital signs: Reviewed and stable  Last Vitals:  Vitals Value Taken Time  BP    Temp    Pulse    Resp 16 12/10/20 1330  SpO2    Vitals shown include unvalidated device data.  Last Pain:  Vitals:   12/10/20 1246  TempSrc: Temporal  PainSc: 4          Complications: No complications documented.

## 2020-12-10 NOTE — Op Note (Signed)
[facility] Patient Name: Tanya Walsh Procedure Date : 12/10/2020 MRN: [id number] Attending MD: Ronnette Juniper , MD Date of Birth: [birth year] CSN: [id number] Age: 65 Admit Type: Outpatient Procedure:                Upper GI endoscopy Indications:              Upper abdominal pain, Unexplained iron deficiency                            anemia, dark stools, ASA use Providers:                Ronnette Juniper, MD, Kary Kos RN, RN, Fransico Setters                            Mbumina, Technician Referring MD:             Dwain Sarna, [facility], Dr,Brahmbhatt Medicines:                Monitored Anesthesia Care Complications:            No immediate complications. Estimated blood loss:                            Minimal. Estimated Blood Loss:     Estimated blood loss was minimal. Procedure:                Pre-Anesthesia Assessment:                           - Prior to the procedure, a History and Physical                            was performed, and patient medications and                            allergies were reviewed. The patient's tolerance of                            previous anesthesia was also reviewed. The risks                            and benefits of the procedure and the sedation                            options and risks were discussed with the patient.                            All questions were answered, and informed consent                            was obtained. Prior Anticoagulants: The patient has                            taken no previous anticoagulant or antiplatelet  agents except for aspirin. ASA Grade Assessment:                            III - A patient with severe systemic disease. After                            reviewing the risks and benefits, the patient was                            deemed in satisfactory condition to undergo the                            procedure.                           After  obtaining informed consent, the endoscope was                            passed under direct vision. Throughout the                            procedure, the patient's blood pressure, pulse, and                            oxygen saturations were monitored continuously. The                            GIF-H190 ([id number] Olympus gastroscope was                            introduced through the mouth, and advanced to the                            second part of duodenum. The upper GI endoscopy was                            accomplished without difficulty. The patient                            tolerated the procedure well. Scope In: Scope Out: Findings:      The upper third of the esophagus, middle third of the esophagus and       lower third of the esophagus were normal.      The Z-line was regular and was found 38 cm from the incisors.      Localized moderately erythematous mucosa without bleeding was found in       the gastric antrum. Biopsies were taken with a cold forceps for       Helicobacter pylori testing. After taking superficial biopsies, there       was evidence of continued but slow oozing. To stop active bleeding, one       hemostatic clip was successfully placed (MR conditional). There was no       bleeding at the end of the procedure. (Patient is a Sales promotion account executive witness).      The cardia and gastric fundus were  normal on retroflexion.      The examined duodenum was normal. Biopsies for histology were taken with       a cold forceps for evaluation of celiac disease. Impression:               - Normal upper third of esophagus, middle third of                            esophagus and lower third of esophagus.                           - Z-line regular, 38 cm from the incisors.                           - Erythematous mucosa in the antrum. Biopsied. Clip                            (MR conditional) was placed.                           - Normal examined duodenum. Biopsied. Moderate  Sedation:      Patient did not receive moderate sedation for this procedure, but       instead received monitored anesthesia care. Recommendation:           - Clear liquid diet.                           - Continue present medications.                           - Await pathology results.                           - Perform a colonoscopy tomorrow. Procedure Code(s):        --- Professional ---                           [phone], 102, Esophagogastroduodenoscopy, flexible,                            transoral; with control of bleeding, any method                           43239, Esophagogastroduodenoscopy, flexible,                            transoral; with biopsy, single or multiple Diagnosis Code(s):        --- Professional ---                           K31.89, Other diseases of stomach and duodenum                           R10.10, Upper abdominal pain, unspecified                           D50.9, Iron deficiency anemia, unspecified  CPT copyright 2019 American Medical Association. All rights reserved. The codes documented in this report are preliminary and upon coder review may  be revised to meet current compliance requirements. Ronnette Juniper, MD 12/10/2020 1:29:19 PM This report has been signed electronically. Number of Addenda: 0

## 2020-12-10 NOTE — Brief Op Note (Signed)
12/09/2020 - 12/10/2020  1:29 PM  PATIENT:  Tanya Walsh  65 y.o. female  PRE-OPERATIVE DIAGNOSIS:  melena  POST-OPERATIVE DIAGNOSIS:  no bleeding  PROCEDURE:  Procedure(s): ESOPHAGOGASTRODUODENOSCOPY (EGD) WITH PROPOFOL (N/A) BIOPSY HEMOSTASIS CLIP PLACEMENT  SURGEON:  Surgeon(s) and Role:    Ronnette Juniper, MD - Primary  PHYSICIAN ASSISTANT:   ASSISTANTS: Silvana Newness, Foustina Mbumina,Tech  ANESTHESIA:   MAC  EBL:  Minimal  BLOOD ADMINISTERED:none  DRAINS: none   LOCAL MEDICATIONS USED:  NONE  SPECIMEN:  Biopsy / Limited Resection  DISPOSITION OF SPECIMEN:  PATHOLOGY  COUNTS:  YES  TOURNIQUET:  * No tourniquets in log *  DICTATION: .Dragon Dictation  PLAN OF CARE: Admit to inpatient   PATIENT DISPOSITION:  PACU - hemodynamically stable.   Delay start of Pharmacological VTE agent (>24hrs) due to surgical blood loss or risk of bleeding: not applicable

## 2020-12-10 NOTE — Progress Notes (Signed)
Pt admitted from ED to 4NP-09. Pt A&Ox4 w/ no c/o pain/discomfort. Ambulates independently. Is on RA with no c/o of SOB and o2 sat: 99%,  but does endorse dyspnea when having to bend down or with longer ambulation periods. Abdomen is soft and non-tender, LBM was PTA today and pt reports it being dark colored. Pt educated on fall prevention strategy as well as call bell usage. Pt verbalizes understanding and is agreeable with plan of care. Pt reports that she would like to be a DNR but is agreeable with intubation if needed. MD paged to update code status. Pt is also a Jehovah's witness and therefore refuses blood products. Blood product refusal consent is signed and in chart.

## 2020-12-10 NOTE — Plan of Care (Signed)
  Problem: Education: Goal: Knowledge of General Education information will improve Description: Including pain rating scale, medication(s)/side effects and non-pharmacologic comfort measures Outcome: Progressing   Problem: Health Behavior/Discharge Planning: Goal: Ability to manage health-related needs will improve Outcome: Progressing   Problem: Clinical Measurements: Goal: Ability to maintain clinical measurements within normal limits will improve Outcome: Progressing Goal: Will remain free from infection Outcome: Progressing Goal: Diagnostic test results will improve Outcome: Progressing Goal: Respiratory complications will improve Outcome: Progressing Goal: Cardiovascular complication will be avoided Outcome: Progressing   Problem: Activity: Goal: Risk for activity intolerance will decrease Outcome: Progressing   Problem: Nutrition: Goal: Adequate nutrition will be maintained Outcome: Progressing   Problem: Elimination: Goal: Will not experience complications related to bowel motility Outcome: Progressing Goal: Will not experience complications related to urinary retention Outcome: Progressing   Problem: Pain Managment: Goal: General experience of comfort will improve Outcome: Progressing   Problem: Safety: Goal: Ability to remain free from injury will improve Outcome: Progressing   Problem: Skin Integrity: Goal: Risk for impaired skin integrity will decrease Outcome: Progressing   Problem: Education: Goal: Ability to identify signs and symptoms of gastrointestinal bleeding will improve Outcome: Progressing   Problem: Bowel/Gastric: Goal: Will show no signs and symptoms of gastrointestinal bleeding Outcome: Progressing   Problem: Fluid Volume: Goal: Will show no signs and symptoms of excessive bleeding Outcome: Progressing   Problem: Clinical Measurements: Goal: Complications related to the disease process, condition or treatment will be avoided or  minimized Outcome: Progressing

## 2020-12-10 NOTE — Progress Notes (Signed)
PROGRESS NOTE    Tanya Walsh  RKY:[id number] DOB: 12/21/1955 DOA: 12/09/2020 PCP: Katherina Mires, MD    Brief Narrative:  65 y/o female admitted to the hospital with intermittent melena for approx 2 weeks prior to admission. Noted to have worsening anemia and admitted for further evaluation for GI bleeding.   Assessment & Plan:   Active Problems:   Essential hypertension   Melena   GIB (gastrointestinal bleeding)   GI bleeding -patient was having black colored stools prior to admission -she had a significant drop in hemoglobin -seen by GI and had EGD on 3/30, no findings to explain GIB -plans for colonoscopy on 3/31  Acute/subacute blood loss anemia -Baseline hgb 11 -admitted with hgb 9, which trended down to 8 -Based upon her religious beliefs, she does not wish to receive any blood products -she was agreeable to receive IV iron and received a dose today  Hypokalemia -replace -Check Mg  HTN.  -Continue to hold ARB and diuretic -BP is stable  Left flank pain -suspect musculoskeletal pain -check urinalysis   DVT prophylaxis: SCDs Start: 12/09/20 1631  Code Status: partial code, no chest compressions Family Communication: discussed with patient Disposition Plan: Status is: Inpatient  Remains inpatient appropriate because:Ongoing diagnostic testing needed not appropriate for outpatient work up   Dispo: The patient is from: Home              Anticipated d/c is to: Home              Patient currently is not medically stable to d/c.   Difficult to place patient No         Consultants:   GI, Dr. Therisa Doyne  Procedures:  EGD: Normal upper third of esophagus, middle third of                            esophagus and lower third of esophagus.                           - Z-line regular, 38 cm from the incisors.                           - Erythematous mucosa in the antrum. Biopsied. Clip                            (MR conditional) was placed.                             - Normal examined duodenum. Biopsied.  Antimicrobials:       Subjective: She has not had a BM since arriving to the hospital. No abdominal pain. She does have left flank pain  Objective: Vitals:   12/10/20 1340 12/10/20 1350 12/10/20 1520 12/10/20 1527  BP: (!) 142/79 (!) 161/88  (!) 148/74  Pulse: 61 60  64  Resp: 14 15  16   Temp:   97.9 F (36.6 C) 98.1 F (36.7 C)  TempSrc:   Oral Oral  SpO2:  100%    Weight:      Height:        Intake/Output Summary (Last 24 hours) at 12/10/2020 1845 Last data filed at 12/10/2020 1829 Gross per 24 hour  Intake 1050 ml  Output --  Net 1050 ml  Filed Weights   12/10/20 0003 12/10/20 1246  Weight: 80.6 kg 80.6 kg    Examination:  General exam: Appears calm and comfortable  Respiratory system: Clear to auscultation. Respiratory effort normal. Cardiovascular system: S1 & S2 heard, RRR. No JVD, murmurs, rubs, gallops or clicks. No pedal edema. Gastrointestinal system: Abdomen is nondistended, soft and nontender. No organomegaly or masses felt. Normal bowel sounds heard. Central nervous system: Alert and oriented. No focal neurological deficits. Extremities: Symmetric 5 x 5 power. Skin: No rashes, lesions or ulcers Psychiatry: Judgement and insight appear normal. Mood & affect appropriate.     Data Reviewed: I have personally reviewed following labs and imaging studies  CBC: Recent Labs  Lab 12/09/20 1324 12/10/20 0547  WBC 4.8 4.2  HGB 9.0* 8.0*  HCT 30.8* 27.1*  MCV 81.5 79.9*  PLT 411* 093   Basic Metabolic Panel: Recent Labs  Lab 12/09/20 1324 12/10/20 0547  NA 139 137  K 3.4* 3.4*  CL 105 105  CO2 28 27  GLUCOSE 178* 95  BUN 7* 5*  CREATININE 0.94 0.84  CALCIUM 9.1 8.7*   GFR: Estimated Creatinine Clearance: 66.8 mL/min (by C-G formula based on SCr of 0.84 mg/dL). Liver Function Tests: Recent Labs  Lab 12/09/20 1324 12/10/20 0547  AST 24 19  ALT 17 15  ALKPHOS 46 44  BILITOT 0.5  0.6  PROT 6.9 5.9*  ALBUMIN 3.7 3.2*   No results for input(s): LIPASE, AMYLASE in the last 168 hours. No results for input(s): AMMONIA in the last 168 hours. Coagulation Profile: Recent Labs  Lab 12/09/20 1548  INR 1.0   Cardiac Enzymes: No results for input(s): CKTOTAL, CKMB, CKMBINDEX, TROPONINI in the last 168 hours. BNP (last 3 results) No results for input(s): PROBNP in the last 8760 hours. HbA1C: No results for input(s): HGBA1C in the last 72 hours. CBG: No results for input(s): GLUCAP in the last 168 hours. Lipid Profile: No results for input(s): CHOL, HDL, LDLCALC, TRIG, CHOLHDL, LDLDIRECT in the last 72 hours. Thyroid Function Tests: No results for input(s): TSH, T4TOTAL, FREET4, T3FREE, THYROIDAB in the last 72 hours. Anemia Panel: Recent Labs    12/10/20 0547  VITAMINB12 651  FOLATE 39.2  FERRITIN 5*  TIBC 391  IRON 16*  RETICCTPCT 2.0   Sepsis Labs: No results for input(s): PROCALCITON, LATICACIDVEN in the last 168 hours.  Recent Results (from the past 240 hour(s))  Resp Panel by RT-PCR (Flu A&B, Covid)     Status: None   Collection Time: 12/09/20 10:27 PM  Result Value Ref Range Status   SARS Coronavirus 2 by RT PCR NEGATIVE NEGATIVE Final    Comment: (NOTE) SARS-CoV-2 target nucleic acids are NOT DETECTED.  The SARS-CoV-2 RNA is generally detectable in upper respiratory specimens during the acute phase of infection. The lowest concentration of SARS-CoV-2 viral copies this assay can detect is 138 copies/mL. A negative result does not preclude SARS-Cov-2 infection and should not be used as the sole basis for treatment or other patient management decisions. A negative result may occur with  improper specimen collection/handling, submission of specimen other than nasopharyngeal swab, presence of viral mutation(s) within the areas targeted by this assay, and inadequate number of viral copies(<138 copies/mL). A negative result must be combined  with clinical observations, patient history, and epidemiological information. The expected result is Negative.  Fact Sheet for Patients:  EntrepreneurPulse.com.au  Fact Sheet for Healthcare Providers:  IncredibleEmployment.be  This test is no t yet approved or cleared by  the [employer] and  has been authorized for detection and/or diagnosis of SARS-CoV-2 by FDA under an Emergency Use Authorization (EUA). This EUA will remain  in effect (meaning this test can be used) for the duration of the COVID-19 declaration under Section 564(b)(1) of the Act, 21 U.S.C.section 360bbb-3(b)(1), unless the authorization is terminated  or revoked sooner.       Influenza A by PCR NEGATIVE NEGATIVE Final   Influenza B by PCR NEGATIVE NEGATIVE Final    Comment: (NOTE) The Xpert Xpress SARS-CoV-2/FLU/RSV plus assay is intended as an aid in the diagnosis of influenza from Nasopharyngeal swab specimens and should not be used as a sole basis for treatment. Nasal washings and aspirates are unacceptable for Xpert Xpress SARS-CoV-2/FLU/RSV testing.  Fact Sheet for Patients: EntrepreneurPulse.com.au  Fact Sheet for Healthcare Providers: IncredibleEmployment.be  This test is not yet approved or cleared by the [place] FDA and has been authorized for detection and/or diagnosis of SARS-CoV-2 by FDA under an Emergency Use Authorization (EUA). This EUA will remain in effect (meaning this test can be used) for the duration of the COVID-19 declaration under Section 564(b)(1) of the Act, 21 U.S.C. section 360bbb-3(b)(1), unless the authorization is terminated or revoked.  Performed at [facility], [place] [address] [place], [place] [address]          Radiology Studies: No results found.      Scheduled Meds: . DULoxetine  30 mg Oral Daily  . losartan  50 mg Oral Daily  . [START ON 12/11/2020] pantoprazole  (PROTONIX) IV  40 mg Intravenous Daily  . [START ON 12/11/2020] polyethylene glycol-electrolytes  2,000 mL Oral Once  . rosuvastatin  20 mg Oral q1800   Continuous Infusions: . sodium chloride 75 mL/hr at 12/09/20 1746     LOS: 1 day    Time spent: 7mins    Kathie Dike, MD [facility]   If 7PM-7AM, please contact night-coverage [email or web address]  12/10/2020, 6:45 PM

## 2020-12-10 NOTE — Consult Note (Signed)
[facility] Consult  Referring Provider: ER Primary Care Physician:  Katherina Mires, MD Primary Gastroenterologist: Dr. Alessandra Bevels  Reason for Consultation: Symptomatic anemia, dark stools  HPI: Tanya Walsh is a 65 y.o. female was advised to come to the ER when she was noted to have severe, symptomatic iron deficiency anemia. Labs as an outpatient with [facility] from 11/17/2020 showed low ferritin of 7 and an iron saturation of 19%, hemoglobin was 11 with an MCV of 78.5. Patient was advised to start multivitamin with iron, take pantoprazole 40 mg daily and had repeat labs performed on 12/08/2020 which showed hemoglobin had worsened to 8.6 with an MCV of 76.7 and patient was advised to proceed to the ER for admission.  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 Patient states that for the last several weeks she has experienced upper abdominal pain which may worsen with meals, and sometimes with bowel movements. For the last 1 week her stools have been unusually dark almost black, which normalized in color to light after she stopped taking aspirin 81 mg for the last 3 to 4 days. She however has developed worsening exertional shortness of breath, lightheadedness and has lost a few pounds. She denies acid reflux, heartburn, early satiety, difficulty swallowing or pain on swallowing. She had an endoscopy in 2016 and was noted to have erosive gastritis and a small duodenal ulcer, biopsies negative for H. Pylori.  She has had prior colonoscopies in 2006, 2011 in 2016 which showed left-sided diverticulosis. She was originally scheduled for an EGD and a colonoscopy in May 2022 with Dr. Alessandra Bevels, however due to worsening anemia she was recommended inpatient work-up.   Past Medical History:  Diagnosis Date  . Coronary artery disease   . Depression   . GI bleed 07/17/2015  . Hypertension   . Myocardial infarction ([place])   .  Nocturia 08/14/2019  . Refusal of blood transfusions as patient is Jehovah's Witness     Past Surgical History:  Procedure Laterality Date  . ABDOMINAL HYSTERECTOMY    . BACK SURGERY    . ESOPHAGOGASTRODUODENOSCOPY N/A 07/17/2015   Procedure: ESOPHAGOGASTRODUODENOSCOPY (EGD);  Surgeon: Wonda Horner, MD;  Location[facility] ENDOSCOPY;  Service: Endoscopy;  Laterality: N/A;    Prior to Admission medications   Medication Sig Start Date End Date Taking? Authorizing Provider  aspirin EC 81 MG tablet Take 81 mg by mouth daily.   Yes [provider]  DULoxetine (CYMBALTA) 60 MG capsule Take 60 mg by mouth daily. 11/26/20  Yes [provider]  losartan-hydrochlorothiazide (HYZAAR) 50-12.5 MG tablet Take 1 tablet by mouth daily. 03/21/18  Yes [provider]  Multiple Vitamins-Minerals (MULTIVITAMIN ADULTS 50+) TABS Take 1 tablet by mouth daily.   Yes [provider]  pantoprazole (PROTONIX) 40 MG tablet Take 40 mg by mouth daily.   Yes [provider]  rosuvastatin (CRESTOR) 20 MG tablet Take 20 mg by mouth daily.   Yes [provider]  nitroGLYCERIN (NITROSTAT) 0.4 MG SL tablet Place 1 tablet (0.4 mg total) under the tongue every 5 (five) minutes as needed for chest pain. 6/43/32   Delora Fuel, MD    Current Facility-Administered Medications  Medication Dose Route Frequency Provider Last Rate Last Admin  . 0.9 %  sodium chloride infusion   Intravenous Continuous Cherene Altes, MD 75 mL/hr at 12/09/20 1746 Rate Verify at 12/09/20 1746  . acetaminophen (TYLENOL) tablet 650 mg  650  mg Oral Q6H PRN Cherene Altes, MD      . DULoxetine (CYMBALTA) DR capsule 30 mg  30 mg Oral Daily Joette Catching T, MD      . losartan (COZAAR) tablet 50 mg  50 mg Oral Daily Joette Catching T, MD      . ondansetron [facility]) tablet 4 mg  4 mg Oral Q6H PRN Cherene Altes, MD       Or  . ondansetron [facility]) injection 4 mg  4 mg Intravenous Q6H PRN Cherene Altes, MD      . pantoprazole (PROTONIX) injection 40 mg  40 mg Intravenous Q12H Cherene Altes, MD   40 mg at 12/09/20 2310  . polyethylene glycol (MIRALAX / GLYCOLAX) packet 17 g  17 g Oral Daily PRN Cherene Altes, MD      . traMADol [facility]at[facility]) tablet 50 mg  50 mg Oral Q6H PRN Cherene Altes, MD        Allergies as of 12/09/2020  . (No Known Allergies)    Family History  Problem Relation Age of Onset  . Heart disease Father   . Heart disease Brother     Social History   Socioeconomic History  . Marital status: Widowed    Spouse name: Not on file  . Number of children: Not on file  . Years of education: Not on file  . Highest education level: Not on file  Occupational History  . Not on file  Tobacco Use  . Smoking status: Never Smoker  . Smokeless tobacco: Never Used  Vaping Use  . Vaping Use: Never used  Substance and Sexual Activity  . Alcohol use: Yes    Comment: rare  . Drug use: No  . Sexual activity: Not on file  Other Topics Concern  . Not on file  Social History Narrative   Right handed   Drinks caffeine   One story home   Social Determinants of Health   Financial Resource Strain: Not on file  Food Insecurity: Not on file  Transportation Needs: Not on file  Physical Activity: Not on file  Stress: Not on file  Social Connections: Not on file  Intimate Partner Violence: Not on file    Review of Systems: Positive for: GI: Described in detail in HPI.    Gen: fatigue, weakness, malaise, involuntary weight loss,  denies any fever, chills, rigors, night sweats, anorexia and sleep disorder CV: palpitations, denies chest pain, angina,  syncope, orthopnea, PND, peripheral edema and claudication. Resp: dyspnea, denies  cough, sputum, wheezing, coughing up blood. GU : Denies urinary burning, blood in urine, urinary frequency, urinary hesitancy, nocturnal urination, and urinary incontinence. MS: Denies joint pain or swelling.  Denies muscle  weakness, cramps, atrophy.  Derm: Denies rash, itching, oral ulcerations, hives, unhealing ulcers.  Psych: Denies depression, anxiety, memory loss, suicidal ideation, hallucinations,  and confusion. Heme: Denies bruising and enlarged lymph nodes. Neuro:  dizziness,  denies any headaches, paresthesias. Endo:  Denies any problems with DM, thyroid, adrenal function.  Physical Exam: Vital signs in last 24 hours: Temp:  [97.5 F (36.4 C)-98.6 F (37 C)] 97.5 F (36.4 C) (03/30 0401) Pulse Rate:  [51-87] 75 (03/30 0401) Resp:  [13-22] 17 (03/29 2350) BP: (117-185)/(63-97) 117/80 (03/30 0401) SpO2:  [98 %-100 %] 100 % (03/30 0401) Weight:  [80.6 kg] 80.6 kg (03/30 0003) Last BM Date: 12/10/20  General:   Alert,  Well-developed, well-nourished, pleasant and cooperative in NAD Head:  Normocephalic  and atraumatic. Eyes:  Sclera clear, no icterus.   Prominent pallor  ears:  Normal auditory acuity. Nose:  No deformity, discharge,  or lesions. Mouth:  No deformity or lesions.  Oropharynx pink & moist. Neck:  Supple; no masses or thyromegaly. Lungs:  Clear throughout to auscultation.   No wheezes, crackles, or rhonchi. No acute distress. Heart:  Regular rate and rhythm; no murmurs, clicks, rubs,  or gallops. Extremities:  Without clubbing or edema. Neurologic:  Alert and  oriented x4;  grossly normal neurologically. Skin:  Intact without significant lesions or rashes. Psych:  Alert and cooperative. Normal mood and affect. Abdomen:  Soft, nontender and nondistended. No masses, hepatosplenomegaly or hernias noted. Normal bowel sounds, without guarding, and without rebound.         Lab Results: Recent Labs    12/09/20 1324 12/10/20 0547  WBC 4.8 4.2  HGB 9.0* 8.0*  HCT 30.8* 27.1*  PLT 411* 346   BMET Recent Labs    12/09/20 1324  NA 139  K 3.4*  CL 105  CO2 28  GLUCOSE 178*  BUN 7*  CREATININE 0.94  CALCIUM 9.1   LFT Recent Labs    12/09/20 1324  PROT 6.9  ALBUMIN  3.7  AST 24  ALT 17  ALKPHOS 46  BILITOT 0.5   PT/INR Recent Labs    12/09/20 1548  LABPROT 13.2  INR 1.0    Studies/Results: No results found.  Impression: Severe symptomatic iron deficiency, microcytic anemia History of dark stools, possible melena Upper abdominal pain  Aspirin use Jehovah's Witness  Plan: EGD scheduled for today. Will give IV iron, as patient refuses blood transfusions. Most likely anemia is secondary to upper GI blood loss, possible peptic ulcer disease. However, if EGD is unrevealing for cause of anemia, patient will need colonoscopy thereafter for further evaluation. The risks and the benefits of the procedure with anesthesia were discussed with the patient in details. She understands and verbalizes consent.   LOS: 1 day   Ronnette Juniper, MD  12/10/2020, 7:47 AM

## 2020-12-10 NOTE — Plan of Care (Signed)
  Problem: Education: Goal: Knowledge of General Education information will improve Description Including pain rating scale, medication(s)/side effects and non-pharmacologic comfort measures Outcome: Progressing   

## 2020-12-10 NOTE — H&P (View-Only) (Signed)
[facility] Consult  Referring Provider: ER Primary Care Physician:  Katherina Mires, MD Primary Gastroenterologist: Dr. Alessandra Bevels  Reason for Consultation: Symptomatic anemia, dark stools  HPI: Tanya Walsh is a 65 y.o. female was advised to come to the ER when she was noted to have severe, symptomatic iron deficiency anemia. Labs as an outpatient with [facility] from 11/17/2020 showed low ferritin of 7 and an iron saturation of 19%, hemoglobin was 11 with an MCV of 78.5. Patient was advised to start multivitamin with iron, take pantoprazole 40 mg daily and had repeat labs performed on 12/08/2020 which showed hemoglobin had worsened to 8.6 with an MCV of 76.7 and patient was advised to proceed to the ER for admission.  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 Patient states that for the last several weeks she has experienced upper abdominal pain which may worsen with meals, and sometimes with bowel movements. For the last 1 week her stools have been unusually dark almost black, which normalized in color to light after she stopped taking aspirin 81 mg for the last 3 to 4 days. She however has developed worsening exertional shortness of breath, lightheadedness and has lost a few pounds. She denies acid reflux, heartburn, early satiety, difficulty swallowing or pain on swallowing. She had an endoscopy in 2016 and was noted to have erosive gastritis and a small duodenal ulcer, biopsies negative for H. Pylori.  She has had prior colonoscopies in 2006, 2011 in 2016 which showed left-sided diverticulosis. She was originally scheduled for an EGD and a colonoscopy in May 2022 with Dr. Alessandra Bevels, however due to worsening anemia she was recommended inpatient work-up.   Past Medical History:  Diagnosis Date  . Coronary artery disease   . Depression   . GI bleed 07/17/2015  . Hypertension   . Myocardial infarction ([place])   .  Nocturia 08/14/2019  . Refusal of blood transfusions as patient is Jehovah's Witness     Past Surgical History:  Procedure Laterality Date  . ABDOMINAL HYSTERECTOMY    . BACK SURGERY    . ESOPHAGOGASTRODUODENOSCOPY N/A 07/17/2015   Procedure: ESOPHAGOGASTRODUODENOSCOPY (EGD);  Surgeon: Wonda Horner, MD;  Location: [facility] ENDOSCOPY;  Service: Endoscopy;  Laterality: N/A;    Prior to Admission medications   Medication Sig Start Date End Date Taking? Authorizing Provider  aspirin EC 81 MG tablet Take 81 mg by mouth daily.   Yes [provider]  DULoxetine (CYMBALTA) 60 MG capsule Take 60 mg by mouth daily. 11/26/20  Yes [provider]  losartan-hydrochlorothiazide (HYZAAR) 50-12.5 MG tablet Take 1 tablet by mouth daily. 03/21/18  Yes [provider]  Multiple Vitamins-Minerals (MULTIVITAMIN ADULTS 50+) TABS Take 1 tablet by mouth daily.   Yes [provider]  pantoprazole (PROTONIX) 40 MG tablet Take 40 mg by mouth daily.   Yes [provider]  rosuvastatin (CRESTOR) 20 MG tablet Take 20 mg by mouth daily.   Yes [provider]  nitroGLYCERIN (NITROSTAT) 0.4 MG SL tablet Place 1 tablet (0.4 mg total) under the tongue every 5 (five) minutes as needed for chest pain. 8/92/11   Delora Fuel, MD    Current Facility-Administered Medications  Medication Dose Route Frequency Provider Last Rate Last Admin  . 0.9 %  sodium chloride infusion   Intravenous Continuous Cherene Altes, MD 75 mL/hr at 12/09/20 1746 Rate Verify at 12/09/20 1746  . acetaminophen (TYLENOL) tablet 650 mg  650  mg Oral Q6H PRN Cherene Altes, MD      . DULoxetine (CYMBALTA) DR capsule 30 mg  30 mg Oral Daily Joette Catching T, MD      . losartan (COZAAR) tablet 50 mg  50 mg Oral Daily Joette Catching T, MD      . ondansetron [facility]) tablet 4 mg  4 mg Oral Q6H PRN Cherene Altes, MD       Or  . ondansetron [facility]) injection 4 mg  4 mg Intravenous Q6H PRN Cherene Altes, MD      . pantoprazole (PROTONIX) injection 40 mg  40 mg Intravenous Q12H Cherene Altes, MD   40 mg at 12/09/20 2310  . polyethylene glycol (MIRALAX / GLYCOLAX) packet 17 g  17 g Oral Daily PRN Cherene Altes, MD      . traMADol [facility]) tablet 50 mg  50 mg Oral Q6H PRN Cherene Altes, MD        Allergies as of 12/09/2020  . (No Known Allergies)    Family History  Problem Relation Age of Onset  . Heart disease Father   . Heart disease Brother     Social History   Socioeconomic History  . Marital status: Widowed    Spouse name: Not on file  . Number of children: Not on file  . Years of education: Not on file  . Highest education level: Not on file  Occupational History  . Not on file  Tobacco Use  . Smoking status: Never Smoker  . Smokeless tobacco: Never Used  Vaping Use  . Vaping Use: Never used  Substance and Sexual Activity  . Alcohol use: Yes    Comment: rare  . Drug use: No  . Sexual activity: Not on file  Other Topics Concern  . Not on file  Social History Narrative   Right handed   Drinks caffeine   One story home   Social Determinants of Health   Financial Resource Strain: Not on file  Food Insecurity: Not on file  Transportation Needs: Not on file  Physical Activity: Not on file  Stress: Not on file  Social Connections: Not on file  Intimate Partner Violence: Not on file    Review of Systems: Positive for: GI: Described in detail in HPI.    Gen: fatigue, weakness, malaise, involuntary weight loss,  denies any fever, chills, rigors, night sweats, anorexia and sleep disorder CV: palpitations, denies chest pain, angina,  syncope, orthopnea, PND, peripheral edema and claudication. Resp: dyspnea, denies  cough, sputum, wheezing, coughing up blood. GU : Denies urinary burning, blood in urine, urinary frequency, urinary hesitancy, nocturnal urination, and urinary incontinence. MS: Denies joint pain or swelling.  Denies muscle  weakness, cramps, atrophy.  Derm: Denies rash, itching, oral ulcerations, hives, unhealing ulcers.  Psych: Denies depression, anxiety, memory loss, suicidal ideation, hallucinations,  and confusion. Heme: Denies bruising and enlarged lymph nodes. Neuro:  dizziness,  denies any headaches, paresthesias. Endo:  Denies any problems with DM, thyroid, adrenal function.  Physical Exam: Vital signs in last 24 hours: Temp:  [97.5 F (36.4 C)-98.6 F (37 C)] 97.5 F (36.4 C) (03/30 0401) Pulse Rate:  [51-87] 75 (03/30 0401) Resp:  [13-22] 17 (03/29 2350) BP: (117-185)/(63-97) 117/80 (03/30 0401) SpO2:  [98 %-100 %] 100 % (03/30 0401) Weight:  [80.6 kg] 80.6 kg (03/30 0003) Last BM Date: 12/10/20  General:   Alert,  Well-developed, well-nourished, pleasant and cooperative in NAD Head:  Normocephalic  and atraumatic. Eyes:  Sclera clear, no icterus.   Prominent pallor  ears:  Normal auditory acuity. Nose:  No deformity, discharge,  or lesions. Mouth:  No deformity or lesions.  Oropharynx pink & moist. Neck:  Supple; no masses or thyromegaly. Lungs:  Clear throughout to auscultation.   No wheezes, crackles, or rhonchi. No acute distress. Heart:  Regular rate and rhythm; no murmurs, clicks, rubs,  or gallops. Extremities:  Without clubbing or edema. Neurologic:  Alert and  oriented x4;  grossly normal neurologically. Skin:  Intact without significant lesions or rashes. Psych:  Alert and cooperative. Normal mood and affect. Abdomen:  Soft, nontender and nondistended. No masses, hepatosplenomegaly or hernias noted. Normal bowel sounds, without guarding, and without rebound.         Lab Results: Recent Labs    12/09/20 1324 12/10/20 0547  WBC 4.8 4.2  HGB 9.0* 8.0*  HCT 30.8* 27.1*  PLT 411* 346   BMET Recent Labs    12/09/20 1324  NA 139  K 3.4*  CL 105  CO2 28  GLUCOSE 178*  BUN 7*  CREATININE 0.94  CALCIUM 9.1   LFT Recent Labs    12/09/20 1324  PROT 6.9  ALBUMIN  3.7  AST 24  ALT 17  ALKPHOS 46  BILITOT 0.5   PT/INR Recent Labs    12/09/20 1548  LABPROT 13.2  INR 1.0    Studies/Results: No results found.  Impression: Severe symptomatic iron deficiency, microcytic anemia History of dark stools, possible melena Upper abdominal pain  Aspirin use Jehovah's Witness  Plan: EGD scheduled for today. Will give IV iron, as patient refuses blood transfusions. Most likely anemia is secondary to upper GI blood loss, possible peptic ulcer disease. However, if EGD is unrevealing for cause of anemia, patient will need colonoscopy thereafter for further evaluation. The risks and the benefits of the procedure with anesthesia were discussed with the patient in details. She understands and verbalizes consent.   LOS: 1 day   Ronnette Juniper, MD  12/10/2020, 7:47 AM

## 2020-12-10 NOTE — Anesthesia Postprocedure Evaluation (Signed)
Anesthesia Post Note  Patient: Tanya Walsh  Procedure(s) Performed: ESOPHAGOGASTRODUODENOSCOPY (EGD) WITH PROPOFOL (N/A ) BIOPSY HEMOSTASIS CLIP PLACEMENT     Patient location during evaluation: PACU Anesthesia Type: MAC Level of consciousness: awake and alert and oriented Pain management: pain level controlled Vital Signs Assessment: post-procedure vital signs reviewed and stable Respiratory status: spontaneous breathing, nonlabored ventilation and respiratory function stable Cardiovascular status: stable and blood pressure returned to baseline Postop Assessment: no apparent nausea or vomiting Anesthetic complications: no   No complications documented.  Last Vitals:  Vitals:   12/10/20 1340 12/10/20 1350  BP: (!) 142/79 (!) 161/88  Pulse: 61 60  Resp: 14 15  Temp:    SpO2:  100%    Last Pain:  Vitals:   12/10/20 1350  TempSrc:   PainSc: 0-No pain                 [name] A.

## 2020-12-11 ENCOUNTER — Inpatient Hospital Stay (HOSPITAL_COMMUNITY): Payer: Medicare Other | Admitting: Anesthesiology

## 2020-12-11 ENCOUNTER — Encounter (HOSPITAL_COMMUNITY): Payer: Self-pay | Admitting: Internal Medicine

## 2020-12-11 ENCOUNTER — Encounter (HOSPITAL_COMMUNITY)
Admission: EM | Disposition: A | Discharge status: Home / Self Care | Payer: Self-pay | Source: Home / Self Care | Attending: Internal Medicine

## 2020-12-11 HISTORY — PX: COLONOSCOPY WITH PROPOFOL: SHX5780

## 2020-12-11 HISTORY — PX: BIOPSY: SHX5522

## 2020-12-11 LAB — CBC
HCT: 27.1 % — ABNORMAL LOW (ref 36.0–46.0)
Hemoglobin: 8.1 g/dL — ABNORMAL LOW (ref 12.0–15.0)
MCH: 24.5 pg — ABNORMAL LOW (ref 26.0–34.0)
MCHC: 29.9 g/dL — ABNORMAL LOW (ref 30.0–36.0)
MCV: 82.1 fL (ref 80.0–100.0)
Platelets: 309 10*3/uL (ref 150–400)
RBC: 3.3 MIL/uL — ABNORMAL LOW (ref 3.87–5.11)
RDW: 15.5 % (ref 11.5–15.5)
WBC: 4.7 10*3/uL (ref 4.0–10.5)
nRBC: 0 % (ref 0.0–0.2)

## 2020-12-11 LAB — BASIC METABOLIC PANEL
Anion gap: 6 (ref 5–15)
BUN: <5 mg/dL — ABNORMAL LOW (ref 8–23)
CO2: 26 mmol/L (ref 22–32)
Calcium: 8.8 mg/dL — ABNORMAL LOW (ref 8.9–10.3)
Chloride: 108 mmol/L (ref 98–111)
Creatinine, Ser: 0.74 mg/dL (ref 0.44–1.00)
GFR, Estimated: >60 mL/min (ref >60)
Glucose, Bld: 92 mg/dL (ref 70–99)
Potassium: 3.6 mmol/L (ref 3.5–5.1)
Sodium: 140 mmol/L (ref 135–145)

## 2020-12-11 LAB — SURGICAL PATHOLOGY

## 2020-12-11 LAB — URINALYSIS, ROUTINE W REFLEX MICROSCOPIC
Bilirubin Urine: NEGATIVE
Glucose, UA: NEGATIVE mg/dL
Hgb urine dipstick: NEGATIVE
Ketones, ur: NEGATIVE mg/dL
Leukocytes,Ua: NEGATIVE
Nitrite: NEGATIVE
Protein, ur: NEGATIVE mg/dL
Specific Gravity, Urine: 1.009 (ref 1.005–1.030)
pH: 5 (ref 5.0–8.0)

## 2020-12-11 LAB — MAGNESIUM: Magnesium: 2 mg/dL (ref 1.7–2.4)

## 2020-12-11 SURGERY — COLONOSCOPY WITH PROPOFOL
Anesthesia: Monitor Anesthesia Care

## 2020-12-11 MED ORDER — LIDOCAINE 2% (20 MG/ML) 5 ML SYRINGE
INTRAMUSCULAR | Status: DC | PRN
  Administered 2020-12-11: 40 mg via INTRAVENOUS

## 2020-12-11 MED ORDER — CYCLOBENZAPRINE HCL 5 MG PO TABS
5.0000 mg | ORAL_TABLET | Freq: Three times a day (TID) | ORAL | 0 refills | Status: AC | PRN
Start: 2020-12-11 — End: ?

## 2020-12-11 MED ORDER — LACTATED RINGERS IV SOLN
INTRAVENOUS | Status: DC
  Administered 2020-12-11: 1000 mL via INTRAVENOUS

## 2020-12-11 MED ORDER — PROPOFOL 500 MG/50ML IV EMUL
INTRAVENOUS | Status: DC | PRN
  Administered 2020-12-11: 150 ug/kg/min via INTRAVENOUS

## 2020-12-11 SURGICAL SUPPLY — 22 items

## 2020-12-11 NOTE — Progress Notes (Signed)
Patient left floor via wheelchair accompanied by staff no c/o pain or shortness of breath at d/c.  Keary Hanak, Tivis Ringer, RN

## 2020-12-11 NOTE — Plan of Care (Signed)
  Problem: Education: Goal: Knowledge of General Education information will improve Description: Including pain rating scale, medication(s)/side effects and non-pharmacologic comfort measures 12/11/2020 1423 by Emmaline Life, RN Outcome: Completed/Met 12/11/2020 0806 by Emmaline Life, RN Outcome: Progressing   Problem: Health Behavior/Discharge Planning: Goal: Ability to manage health-related needs will improve 12/11/2020 1423 by Emmaline Life, RN Outcome: Completed/Met 12/11/2020 0806 by Emmaline Life, RN Outcome: Progressing   Problem: Clinical Measurements: Goal: Ability to maintain clinical measurements within normal limits will improve Outcome: Completed/Met Goal: Will remain free from infection Outcome: Completed/Met Goal: Diagnostic test results will improve Outcome: Completed/Met Goal: Respiratory complications will improve Outcome: Completed/Met Goal: Cardiovascular complication will be avoided Outcome: Completed/Met   Problem: Activity: Goal: Risk for activity intolerance will decrease Outcome: Completed/Met   Problem: Nutrition: Goal: Adequate nutrition will be maintained Outcome: Completed/Met   Problem: Coping: Goal: Level of anxiety will decrease Outcome: Completed/Met   Problem: Elimination: Goal: Will not experience complications related to bowel motility Outcome: Completed/Met Goal: Will not experience complications related to urinary retention Outcome: Completed/Met   Problem: Pain Managment: Goal: General experience of comfort will improve Outcome: Completed/Met   Problem: Safety: Goal: Ability to remain free from injury will improve Outcome: Completed/Met   Problem: Skin Integrity: Goal: Risk for impaired skin integrity will decrease Outcome: Completed/Met   Problem: Education: Goal: Ability to identify signs and symptoms of gastrointestinal bleeding will improve Outcome: Completed/Met   Problem: Bowel/Gastric: Goal: Will  show no signs and symptoms of gastrointestinal bleeding Outcome: Completed/Met   Problem: Fluid Volume: Goal: Will show no signs and symptoms of excessive bleeding Outcome: Completed/Met   Problem: Clinical Measurements: Goal: Complications related to the disease process, condition or treatment will be avoided or minimized Outcome: Completed/Met

## 2020-12-11 NOTE — Plan of Care (Signed)
  Problem: Education: Goal: Knowledge of General Education information will improve Description: Including pain rating scale, medication(s)/side effects and non-pharmacologic comfort measures 12/11/2020 0806 by Emmaline Life, RN Outcome: Progressing 12/10/2020 1828 by Emmaline Life, RN Outcome: Progressing   Problem: Health Behavior/Discharge Planning: Goal: Ability to manage health-related needs will improve Outcome: Progressing

## 2020-12-11 NOTE — Progress Notes (Signed)
Discharge instructions, RX's and follow up explained and provided to patient, patient verbalized understanding.   Treyvion Durkee, Tivis Ringer, RN

## 2020-12-11 NOTE — Interval H&P Note (Signed)
History and Physical Interval Note: 65/female with symptomatic IDA, fairly unremarkable EGD yesterday, for a diagnostic colonoscopy today.  12/11/2020 9:13 AM  [place]  has presented today for colonoscopy, with the diagnosis of Iron deficiency anemia.  The various methods of treatment have been discussed with the patient and family. After consideration of risks, benefits and other options for treatment, the patient has consented to  Procedure(s): COLONOSCOPY WITH PROPOFOL (N/A) as a surgical intervention.  The patient's history has been reviewed, patient examined, no change in status, stable for surgery.  I have reviewed the patient's chart and labs.  Questions were answered to the patient's satisfaction.     Ronnette Juniper

## 2020-12-11 NOTE — Transfer of Care (Signed)
Immediate Anesthesia Transfer of Care Note  Patient: Tanya Walsh  Procedure(s) Performed: COLONOSCOPY WITH PROPOFOL (N/A ) BIOPSY  Patient Location: Endoscopy Unit  Anesthesia Type:MAC  Level of Consciousness: sedated  Airway & Oxygen Therapy: Patient Spontanous Breathing and Patient connected to face mask oxygen  Post-op Assessment: Report given to RN and Post -op Vital signs reviewed and stable  Post vital signs: Reviewed and stable  Last Vitals:  Vitals Value Taken Time  BP 95/61 12/11/20 0957  Temp    Pulse    Resp 15 12/11/20 0957  SpO2    Vitals shown include unvalidated device data.  Last Pain:  Vitals:   12/11/20 0836  TempSrc: Oral  PainSc:          Complications: No complications documented.

## 2020-12-11 NOTE — Anesthesia Preprocedure Evaluation (Signed)
Anesthesia Evaluation  Patient identified by MRN, date of birth, ID band Patient awake    Reviewed: Allergy & Precautions, NPO status , Patient's Chart, lab work & pertinent test results  Airway Mallampati: II  TM Distance: >3 FB Neck ROM: Full    Dental  (+) Teeth Intact, Dental Advisory Given   Pulmonary    breath sounds clear to auscultation       Cardiovascular hypertension,  Rhythm:Regular Rate:Normal     Neuro/Psych    GI/Hepatic   Endo/Other    Renal/GU      Musculoskeletal   Abdominal   Peds  Hematology   Anesthesia Other Findings   Reproductive/Obstetrics                             Anesthesia Physical Anesthesia Plan  ASA: III  Anesthesia Plan: MAC   Post-op Pain Management:    Induction: Intravenous  PONV Risk Score and Plan: Ondansetron  Airway Management Planned: Natural Airway and Simple Face Mask  Additional Equipment:   Intra-op Plan:   Post-operative Plan:   Informed Consent: I have reviewed the patients History and Physical, chart, labs and discussed the procedure including the risks, benefits and alternatives for the proposed anesthesia with the patient or authorized representative who has indicated his/her understanding and acceptance.     Dental advisory given  Plan Discussed with: CRNA and Anesthesiologist  Anesthesia Plan Comments:         Anesthesia Quick Evaluation

## 2020-12-11 NOTE — Op Note (Signed)
[facility] Patient Name: Tanya Walsh Procedure Date : 12/11/2020 MRN: [id number] Attending MD: Ronnette Juniper , MD Date of Birth: 02/19/1956 CSN: [id number] Age: 65 Admit Type: Inpatient Procedure:                Colonoscopy Indications:              Unexplained iron deficiency anemia Providers:                Ronnette Juniper, MD, Particia Nearing, RN, Dulcy Fanny,                            Waynette Buttery., Technician, Trixie Deis, CRNA Referring MD:             Eda Paschal Hospitalist, Dr.Brahmbhatt Medicines:                Monitored Anesthesia Care Complications:            No immediate complications. Estimated blood loss:                            Minimal. Estimated Blood Loss:     Estimated blood loss was minimal. Procedure:                Pre-Anesthesia Assessment:                           - Prior to the procedure, a History and Physical                            was performed, and patient medications and                            allergies were reviewed. The patient's tolerance of                            previous anesthesia was also reviewed. The risks                            and benefits of the procedure and the sedation                            options and risks were discussed with the patient.                            All questions were answered, and informed consent                            was obtained. Prior Anticoagulants: The patient has                            taken no previous anticoagulant or antiplatelet                            agents except for aspirin. ASA Grade Assessment:  III - A patient with severe systemic disease. After                            reviewing the risks and benefits, the patient was                            deemed in satisfactory condition to undergo the                            procedure.                           After obtaining informed consent, the colonoscope                             was passed under direct vision. Throughout the                            procedure, the patient's blood pressure, pulse, and                            oxygen saturations were monitored continuously. The                            PCF-H190DL ([id number]) Olympus pediatric colonoscope                            was introduced through the anus and advanced to the                            the terminal ileum. The colonoscopy was performed                            without difficulty. The patient tolerated the                            procedure well. The quality of the bowel                            preparation was good. Scope In: 9:27:37 AM Scope Out: 9:49:42 AM Scope Withdrawal Time: 0 hours 15 minutes 23 seconds  Total Procedure Duration: 0 hours 22 minutes 5 seconds  Findings:      The perianal and digital rectal examinations were normal.      A 5 mm polyp was found in the transverse colon. The polyp was sessile.       The polyp was removed with a piecemeal technique using a cold biopsy       forceps. Resection and retrieval were complete.      A few small and large-mouthed diverticula were found in the sigmoid       colon, descending colon and transverse colon.      The terminal ileum appeared normal.      Non-bleeding internal hemorrhoids were found during retroflexion. The       hemorrhoids were small.      There was no evidence of  recent or active bleeding noted. Impression:               - One 5 mm polyp in the transverse colon, removed                            piecemeal using a cold biopsy forceps. Resected and                            retrieved.                           - Diverticulosis in the sigmoid colon, in the                            descending colon and in the transverse colon.                           - The examined portion of the ileum was normal.                           - Non-bleeding internal hemorrhoids. Moderate Sedation:       Patient did not receive moderate sedation for this procedure, but       instead received monitored anesthesia care. Recommendation:           - High fiber diet.                           - Continue present medications.                           - Await pathology results.                           - Repeat colonoscopy for surveillance based on                            pathology results.                           - To visualize the small bowel, perform video                            capsule endoscopy at appointment to be scheduled as                            an oputpatient. Procedure Code(s):        --- Professional ---                           [phone], Colonoscopy, flexible; with biopsy, single                            or multiple Diagnosis Code(s):        --- Professional ---  K63.5, Polyp of colon                           K64.8, Other hemorrhoids                           D50.9, Iron deficiency anemia, unspecified                           K57.30, Diverticulosis of large intestine without                            perforation or abscess without bleeding CPT copyright 2019 [employer]. All rights reserved. The codes documented in this report are preliminary and upon coder review may  be revised to meet current compliance requirements. Ronnette Juniper, MD 12/11/2020 10:02:38 AM This report has been signed electronically. Number of Addenda: 0

## 2020-12-11 NOTE — Anesthesia Postprocedure Evaluation (Signed)
Anesthesia Post Note  Patient: Tanya Walsh  Procedure(s) Performed: COLONOSCOPY WITH PROPOFOL (N/A ) BIOPSY     Patient location during evaluation: PACU Anesthesia Type: MAC Level of consciousness: awake and alert Pain management: pain level controlled Vital Signs Assessment: post-procedure vital signs reviewed and stable Respiratory status: spontaneous breathing, nonlabored ventilation, respiratory function stable and patient connected to nasal cannula oxygen Cardiovascular status: stable and blood pressure returned to baseline Postop Assessment: no apparent nausea or vomiting Anesthetic complications: no   No complications documented.  Last Vitals:  Vitals:   12/11/20 1006 12/11/20 1013  BP: 123/66 (!) 142/64  Pulse: 70 (!) 57  Resp: 15 18  Temp:    SpO2: 100% 99%    Last Pain:  Vitals:   12/11/20 1013  TempSrc:   PainSc: 0-No pain                 Rayaan Garguilo COKER

## 2020-12-11 NOTE — Discharge Summary (Signed)
Physician Discharge Summary  PENNYE BEEGHLY ZOX:[id number] DOB: 01-10-1956 DOA: 12/09/2020  PCP: Katherina Mires, MD  Admit date: 12/09/2020 Discharge date: 12/11/2020  Admitted From: home Disposition:  home  Recommendations for Outpatient Follow-up:  1. Follow up with PCP in 1-2 weeks 2. Please obtain BMP/CBC in one week 3. Patient will follow up with GI for outpatient capsule endoscopy   Discharge Condition:stable CODE STATUS: Partial code, no CPR Diet recommendation: heart healthy, high fiber  Brief/Interim Summary: 65 y/o female admitted to the hospital with intermittent melena for approx 2 weeks prior to admission. Noted to have worsening anemia and admitted for further evaluation for GI bleeding.  Discharge Diagnoses:  Active Problems:   Essential hypertension   Melena   GIB (gastrointestinal bleeding)  GI bleeding -patient was having black colored stools prior to admission -she had a significant drop in hemoglobin -seen by GI and had EGD on 3/30, no findings to explain GIB -Colonoscopy on 3/31, showed one polyp that was removed and diverticuli with no signs of bleeding.  -Plans are for outpatient capsule study -continue to hold aspirin until after capsule study  Acute/subacute blood loss anemia -Baseline hgb 11 -admitted with hgb 9, which trended down to 8 and has remained stable -Based upon her religious beliefs, she does not wish to receive any blood products -she was agreeable to receive IV iron and received a dose on 3/30  Hypokalemia -replaced -Mg normal  HTN.  -resume ARB and diuretic on discharge -BP is stable  Left flank pain -suspect musculoskeletal pain -UA negative -improved with flexeril  Discharge Instructions  Discharge Instructions    Diet - low sodium heart healthy   Complete by: As directed    Increase activity slowly   Complete by: As directed      Allergies as of 12/11/2020   No Known Allergies     Medication List     STOP taking these medications   aspirin EC 81 MG tablet     TAKE these medications   cyclobenzaprine 5 MG tablet Commonly known as: FLEXERIL Take 1 tablet (5 mg total) by mouth 3 (three) times daily as needed for muscle spasms.   DULoxetine 60 MG capsule Commonly known as: CYMBALTA Take 60 mg by mouth daily.   losartan-hydrochlorothiazide 50-12.5 MG tablet Commonly known as: HYZAAR Take 1 tablet by mouth daily.   Multivitamin Adults 50+ Tabs Take 1 tablet by mouth daily.   nitroGLYCERIN 0.4 MG SL tablet Commonly known as: NITROSTAT Place 1 tablet (0.4 mg total) under the tongue every 5 (five) minutes as needed for chest pain.   pantoprazole 40 MG tablet Commonly known as: PROTONIX Take 40 mg by mouth daily.   rosuvastatin 20 MG tablet Commonly known as: CRESTOR Take 20 mg by mouth daily.       No Known Allergies  Consultations:  GI, Dr. Therisa Doyne   Procedures/Studies:  No results found.    Subjective: No abdominal pain, no vomiting  Discharge Exam: Vitals:   12/11/20 0836 12/11/20 0956 12/11/20 1006 12/11/20 1013  BP: (!) 142/78 95/61 123/66 (!) 142/64  Pulse:  76 70 (!) 57  Resp: 18 14 15 18   Temp: 98.3 F (36.8 C) 98.6 F (37 C)    TempSrc: Oral Oral    SpO2: 100% 100% 100% 99%  Weight:      Height:        General: Pt is alert, awake, not in acute distress Cardiovascular: RRR, S1/S2 +, no rubs, no gallops  Respiratory: CTA bilaterally, no wheezing, no rhonchi Abdominal: Soft, NT, ND, bowel sounds + Extremities: no edema, no cyanosis    The results of significant diagnostics from this hospitalization (including imaging, microbiology, ancillary and laboratory) are listed below for reference.     Microbiology: Recent Results (from the past 240 hour(s))  Resp Panel by RT-PCR (Flu A&B, Covid)     Status: None   Collection Time: 12/09/20 10:27 PM  Result Value Ref Range Status   SARS Coronavirus 2 by RT PCR NEGATIVE NEGATIVE Final     Comment: (NOTE) SARS-CoV-2 target nucleic acids are NOT DETECTED.  The SARS-CoV-2 RNA is generally detectable in upper respiratory specimens during the acute phase of infection. The lowest concentration of SARS-CoV-2 viral copies this assay can detect is 138 copies/mL. A negative result does not preclude SARS-Cov-2 infection and should not be used as the sole basis for treatment or other patient management decisions. A negative result may occur with  improper specimen collection/handling, submission of specimen other than nasopharyngeal swab, presence of viral mutation(s) within the areas targeted by this assay, and inadequate number of viral copies(<138 copies/mL). A negative result must be combined with clinical observations, patient history, and epidemiological information. The expected result is Negative.  Fact Sheet for Patients:  EntrepreneurPulse.com.au  Fact Sheet for Healthcare Providers:  IncredibleEmployment.be  This test is no t yet approved or cleared by the [place] FDA and  has been authorized for detection and/or diagnosis of SARS-CoV-2 by FDA under an Emergency Use Authorization (EUA). This EUA will remain  in effect (meaning this test can be used) for the duration of the COVID-19 declaration under Section 564(b)(1) of the Act, 21 U.S.C.section 360bbb-3(b)(1), unless the authorization is terminated  or revoked sooner.       Influenza A by PCR NEGATIVE NEGATIVE Final   Influenza B by PCR NEGATIVE NEGATIVE Final    Comment: (NOTE) The Xpert Xpress SARS-CoV-2/FLU/RSV plus assay is intended as an aid in the diagnosis of influenza from Nasopharyngeal swab specimens and should not be used as a sole basis for treatment. Nasal washings and aspirates are unacceptable for Xpert Xpress SARS-CoV-2/FLU/RSV testing.  Fact Sheet for Patients: EntrepreneurPulse.com.au  Fact Sheet for Healthcare  Providers: IncredibleEmployment.be  This test is not yet approved or cleared by the [place] FDA and has been authorized for detection and/or diagnosis of SARS-CoV-2 by FDA under an Emergency Use Authorization (EUA). This EUA will remain in effect (meaning this test can be used) for the duration of the COVID-19 declaration under Section 564(b)(1) of the Act, 21 U.S.C. section 360bbb-3(b)(1), unless the authorization is terminated or revoked.  Performed at [facility], [place] [address], [name], [place] [address]      Labs: BNP (last 3 results) No results for input(s): BNP in the last 8760 hours. Basic Metabolic Panel: Recent Labs  Lab 12/09/20 1324 12/10/20 0547 12/11/20 0321  NA 139 137 140  K 3.4* 3.4* 3.6  CL 105 105 108  CO2 28 27 26   GLUCOSE 178* 95 92  BUN 7* 5* <5*  CREATININE 0.94 0.84 0.74  CALCIUM 9.1 8.7* 8.8*  MG  --   --  2.0   Liver Function Tests: Recent Labs  Lab 12/09/20 1324 12/10/20 0547  AST 24 19  ALT 17 15  ALKPHOS 46 44  BILITOT 0.5 0.6  PROT 6.9 5.9*  ALBUMIN 3.7 3.2*   No results for input(s): LIPASE, AMYLASE in the last 168 hours. No results for  input(s): AMMONIA in the last 168 hours. CBC: Recent Labs  Lab 12/09/20 1324 12/10/20 0547 12/11/20 0321  WBC 4.8 4.2 4.7  HGB 9.0* 8.0* 8.1*  HCT 30.8* 27.1* 27.1*  MCV 81.5 79.9* 82.1  PLT 411* 346 309   Cardiac Enzymes: No results for input(s): CKTOTAL, CKMB, CKMBINDEX, TROPONINI in the last 168 hours. BNP: Invalid input(s): POCBNP CBG: No results for input(s): GLUCAP in the last 168 hours. D-Dimer No results for input(s): DDIMER in the last 72 hours. Hgb A1c No results for input(s): HGBA1C in the last 72 hours. Lipid Profile No results for input(s): CHOL, HDL, LDLCALC, TRIG, CHOLHDL, LDLDIRECT in the last 72 hours. Thyroid function studies No results for input(s): TSH, T4TOTAL, T3FREE, THYROIDAB in the last 72 hours.  Invalid input(s):  FREET3 Anemia work up Recent Labs    12/10/20 0547  VITAMINB12 651  FOLATE 39.2  FERRITIN 5*  TIBC 391  IRON 16*  RETICCTPCT 2.0   Urinalysis    Component Value Date/Time   COLORURINE STRAW (A) 12/11/2020 0339   APPEARANCEUR CLEAR 12/11/2020 0339   LABSPEC 1.009 12/11/2020 0339   PHURINE 5.0 12/11/2020 0339   GLUCOSEU NEGATIVE 12/11/2020 0339   HGBUR NEGATIVE 12/11/2020 0339   BILIRUBINUR NEGATIVE 12/11/2020 0339   KETONESUR NEGATIVE 12/11/2020 0339   PROTEINUR NEGATIVE 12/11/2020 0339   UROBILINOGEN 0.2 07/17/2015 0813   NITRITE NEGATIVE 12/11/2020 0339   LEUKOCYTESUR NEGATIVE 12/11/2020 0339   Sepsis Labs Invalid input(s): PROCALCITONIN,  WBC,  LACTICIDVEN Microbiology Recent Results (from the past 240 hour(s))  Resp Panel by RT-PCR (Flu A&B, Covid)     Status: None   Collection Time: 12/09/20 10:27 PM  Result Value Ref Range Status   SARS Coronavirus 2 by RT PCR NEGATIVE NEGATIVE Final    Comment: (NOTE) SARS-CoV-2 target nucleic acids are NOT DETECTED.  The SARS-CoV-2 RNA is generally detectable in upper respiratory specimens during the acute phase of infection. The lowest concentration of SARS-CoV-2 viral copies this assay can detect is 138 copies/mL. A negative result does not preclude SARS-Cov-2 infection and should not be used as the sole basis for treatment or other patient management decisions. A negative result may occur with  improper specimen collection/handling, submission of specimen other than nasopharyngeal swab, presence of viral mutation(s) within the areas targeted by this assay, and inadequate number of viral copies(<138 copies/mL). A negative result must be combined with clinical observations, patient history, and epidemiological information. The expected result is Negative.  Fact Sheet for Patients:  EntrepreneurPulse.com.au  Fact Sheet for Healthcare Providers:  IncredibleEmployment.be  This test is  no t yet approved or cleared by the [place] FDA and  has been authorized for detection and/or diagnosis of SARS-CoV-2 by FDA under an Emergency Use Authorization (EUA). This EUA will remain  in effect (meaning this test can be used) for the duration of the COVID-19 declaration under Section 564(b)(1) of the Act, 21 U.S.C.section 360bbb-3(b)(1), unless the authorization is terminated  or revoked sooner.       Influenza A by PCR NEGATIVE NEGATIVE Final   Influenza B by PCR NEGATIVE NEGATIVE Final    Comment: (NOTE) The Xpert Xpress SARS-CoV-2/FLU/RSV plus assay is intended as an aid in the diagnosis of influenza from Nasopharyngeal swab specimens and should not be used as a sole basis for treatment. Nasal washings and aspirates are unacceptable for Xpert Xpress SARS-CoV-2/FLU/RSV testing.  Fact Sheet for Patients: EntrepreneurPulse.com.au  Fact Sheet for Healthcare Providers: IncredibleEmployment.be  This test is not yet  approved or cleared by the [place] and has been authorized for detection and/or diagnosis of SARS-CoV-2 by FDA under an Emergency Use Authorization (EUA). This EUA will remain in effect (meaning this test can be used) for the duration of the COVID-19 declaration under Section 564(b)(1) of the Act, 21 U.S.C. section 360bbb-3(b)(1), unless the authorization is terminated or revoked.  Performed at [facility], [place] [address] [place], [place] [address]      Time coordinating discharge: 70mins  SIGNED:   Kathie Dike, MD  [facility] 12/11/2020, 10:51 AM   If 7PM-7AM, please contact night-coverage [email or web address]

## 2020-12-11 NOTE — Brief Op Note (Signed)
12/09/2020 - 12/11/2020  9:58 AM  PATIENT:  Tanya Walsh  65 y.o. female  PRE-OPERATIVE DIAGNOSIS:  Iron deficiency anemia  POST-OPERATIVE DIAGNOSIS:  Transverse polyp biopsy,diverticulosis  PROCEDURE:  Procedure(s): COLONOSCOPY WITH PROPOFOL (N/A) BIOPSY  SURGEON:  Surgeon(s) and Role:    Ronnette Juniper, MD - Primary  PHYSICIAN ASSISTANT:   ASSISTANTS: Hazeline Junker  ANESTHESIA:   MAC  EBL:  Minimal  BLOOD ADMINISTERED:none  DRAINS: none   LOCAL MEDICATIONS USED:  NONE  SPECIMEN:  Biopsy / Limited Resection  DISPOSITION OF SPECIMEN:  PATHOLOGY  COUNTS:  YES  TOURNIQUET:  * No tourniquets in log *  DICTATION: .Dragon Dictation  PLAN OF CARE: Admit to inpatient   PATIENT DISPOSITION:  PACU - hemodynamically stable.   Delay start of Pharmacological VTE agent (>24hrs) due to surgical blood loss or risk of bleeding: not applicable

## 2020-12-11 NOTE — Anesthesia Procedure Notes (Signed)
Procedure Name: MAC Date/Time: 12/11/2020 9:17 AM Performed by: Leonor Liv, CRNA Pre-anesthesia Checklist: Patient identified, Suction available, Emergency Drugs available, Timeout performed and Patient being monitored Patient Re-evaluated:Patient Re-evaluated prior to induction Oxygen Delivery Method: Simple face mask Placement Confirmation: positive ETCO2 Dental Injury: Teeth and Oropharynx as per pre-operative assessment

## 2020-12-12 ENCOUNTER — Encounter (HOSPITAL_COMMUNITY): Payer: Self-pay | Admitting: Gastroenterology

## 2020-12-12 LAB — SURGICAL PATHOLOGY

## 2020-12-17 ENCOUNTER — Encounter (HOSPITAL_COMMUNITY): Payer: Medicare Other

## 2020-12-29 ENCOUNTER — Other Ambulatory Visit: Payer: Self-pay | Admitting: Gastroenterology

## 2020-12-29 DIAGNOSIS — K317 Polyp of stomach and duodenum: Secondary | ICD-10-CM

## 2020-12-30 ENCOUNTER — Other Ambulatory Visit (HOSPITAL_COMMUNITY): Payer: Self-pay

## 2020-12-31 ENCOUNTER — Other Ambulatory Visit: Payer: Self-pay

## 2020-12-31 ENCOUNTER — Ambulatory Visit (HOSPITAL_COMMUNITY)
Admission: RE | Admit: 2020-12-31 | Discharge: 2020-12-31 | Disposition: A | Discharge status: Home / Self Care | Payer: Medicare Other | Source: Ambulatory Visit | Attending: Gastroenterology | Admitting: Gastroenterology

## 2020-12-31 DIAGNOSIS — D509 Iron deficiency anemia, unspecified: Secondary | ICD-10-CM | POA: Insufficient documentation

## 2020-12-31 MED ORDER — SODIUM CHLORIDE 0.9 % IV SOLN
510.0000 mg | Freq: Once | INTRAVENOUS | Status: AC
  Administered 2020-12-31: 510 mg via INTRAVENOUS
  Filled 2020-12-31: qty 510

## 2021-01-01 ENCOUNTER — Ambulatory Visit
Admission: RE | Admit: 2021-01-01 | Discharge: 2021-01-01 | Disposition: A | Discharge status: Home / Self Care | Payer: Medicare Other | Source: Ambulatory Visit | Attending: Gastroenterology | Admitting: Gastroenterology

## 2021-01-01 ENCOUNTER — Other Ambulatory Visit: Payer: Self-pay | Admitting: Gastroenterology

## 2021-01-01 DIAGNOSIS — T81504D Unspecified complication of foreign body accidentally left in body following endoscopic examination, subsequent encounter: Secondary | ICD-10-CM

## 2021-01-08 ENCOUNTER — Emergency Department (HOSPITAL_COMMUNITY): Payer: Medicare Other

## 2021-01-08 ENCOUNTER — Encounter (HOSPITAL_COMMUNITY): Payer: Self-pay | Admitting: Emergency Medicine

## 2021-01-08 ENCOUNTER — Other Ambulatory Visit: Payer: Self-pay

## 2021-01-08 ENCOUNTER — Other Ambulatory Visit: Payer: Medicare Other

## 2021-01-08 ENCOUNTER — Emergency Department (HOSPITAL_COMMUNITY)
Admission: EM | Admit: 2021-01-08 | Discharge: 2021-01-08 | Disposition: A | Discharge status: Home / Self Care | Payer: Medicare Other | Attending: Emergency Medicine | Admitting: Emergency Medicine

## 2021-01-08 DIAGNOSIS — R519 Headache, unspecified: Secondary | ICD-10-CM | POA: Diagnosis not present

## 2021-01-08 DIAGNOSIS — R0789 Other chest pain: Secondary | ICD-10-CM | POA: Diagnosis not present

## 2021-01-08 DIAGNOSIS — I10 Essential (primary) hypertension: Secondary | ICD-10-CM | POA: Diagnosis not present

## 2021-01-08 DIAGNOSIS — R61 Generalized hyperhidrosis: Secondary | ICD-10-CM | POA: Insufficient documentation

## 2021-01-08 DIAGNOSIS — W19XXXA Unspecified fall, initial encounter: Secondary | ICD-10-CM | POA: Insufficient documentation

## 2021-01-08 DIAGNOSIS — I2510 Atherosclerotic heart disease of native coronary artery without angina pectoris: Secondary | ICD-10-CM | POA: Diagnosis not present

## 2021-01-08 DIAGNOSIS — Z79899 Other long term (current) drug therapy: Secondary | ICD-10-CM | POA: Diagnosis not present

## 2021-01-08 DIAGNOSIS — R55 Syncope and collapse: Secondary | ICD-10-CM | POA: Diagnosis not present

## 2021-01-08 LAB — BASIC METABOLIC PANEL
Anion gap: 11 (ref 5–15)
BUN: 16 mg/dL (ref 8–23)
CO2: 26 mmol/L (ref 22–32)
Calcium: 9.6 mg/dL (ref 8.9–10.3)
Chloride: 103 mmol/L (ref 98–111)
Creatinine, Ser: 1.04 mg/dL — ABNORMAL HIGH (ref 0.44–1.00)
GFR, Estimated: 59 mL/min — ABNORMAL LOW (ref >60)
Glucose, Bld: 128 mg/dL — ABNORMAL HIGH (ref 70–99)
Potassium: 3.4 mmol/L — ABNORMAL LOW (ref 3.5–5.1)
Sodium: 140 mmol/L (ref 135–145)

## 2021-01-08 LAB — URINALYSIS, ROUTINE W REFLEX MICROSCOPIC
Bilirubin Urine: NEGATIVE
Glucose, UA: NEGATIVE mg/dL
Hgb urine dipstick: NEGATIVE
Ketones, ur: NEGATIVE mg/dL
Leukocytes,Ua: NEGATIVE
Nitrite: NEGATIVE
Protein, ur: NEGATIVE mg/dL
Specific Gravity, Urine: 1.012 (ref 1.005–1.030)
pH: 5 (ref 5.0–8.0)

## 2021-01-08 LAB — TROPONIN I (HIGH SENSITIVITY)
Troponin I (High Sensitivity): 5 ng/L (ref <18)
Troponin I (High Sensitivity): 4 ng/L (ref <18)

## 2021-01-08 LAB — CBC
HCT: 41.7 % (ref 36.0–46.0)
Hemoglobin: 12.4 g/dL (ref 12.0–15.0)
MCH: 25.6 pg — ABNORMAL LOW (ref 26.0–34.0)
MCHC: 29.7 g/dL — ABNORMAL LOW (ref 30.0–36.0)
MCV: 86.2 fL (ref 80.0–100.0)
Platelets: 281 10*3/uL (ref 150–400)
RBC: 4.84 MIL/uL (ref 3.87–5.11)
RDW: 20.9 % — ABNORMAL HIGH (ref 11.5–15.5)
WBC: 6.3 10*3/uL (ref 4.0–10.5)
nRBC: 0 % (ref 0.0–0.2)

## 2021-01-08 LAB — CBG MONITORING, ED: Glucose-Capillary: 116 mg/dL — ABNORMAL HIGH (ref 70–99)

## 2021-01-08 MED ORDER — SODIUM CHLORIDE 0.9 % IV BOLUS: 1000.0000 mL | Freq: Once | INTRAVENOUS | Status: DC

## 2021-01-08 MED ORDER — SODIUM CHLORIDE 0.9 % IV BOLUS
1000.0000 mL | Freq: Once | INTRAVENOUS | Status: AC
  Administered 2021-01-08: 1000 mL via INTRAVENOUS

## 2021-01-08 NOTE — ED Provider Notes (Signed)
MSE was initiated and I personally evaluated the patient and placed orders (if any) at  4:03 AM on January 08, 2021.  The patient appears stable so that the remainder of the MSE may be completed by another provider.  Patient was visiting with a patient.  She was noted to have a syncopal episode.  On my evaluation she was on the floor.  She was diaphoretic.  Requested EKG and CBG.  EKG shows no acute arrhythmia.  CBG is 116.  Additional work-up by oncoming provider.   Merryl Hacker, MD 01/08/21 [id number]

## 2021-01-08 NOTE — ED Notes (Signed)
Pt back from X-Ray and is now going to CT.

## 2021-01-08 NOTE — ED Provider Notes (Signed)
Tanya Walsh   CSN: [id number] Arrival date & time: 01/08/21  0345     History Chief Complaint  Patient presents with  . Near Syncope    Tanya Walsh is a 65 y.o. female.  HPI     This is a 65 year old female with history of coronary artery disease, hypertension, recent GI bleed who presents with syncope.  Patient was visiting a patient in the ED when she stood up and passed out.  She states she had fallen asleep and believes she was getting up to go to the bathroom.  She does not remember much else.  She states she is having a little bit of chest discomfort and has a significant headache.  She rates her headache at 10 out of 10.  She states she does not normally get headaches.  She is unsure whether she hit her head when she fell.  She denies any shortness of breath.  She recently had an iron infusion and was due to have a capsule endoscopy given her recent GI bleed.  She is a Sales promotion account executive Witness and does not wish to have blood transfusions.  She states she has passed out in the past but does not Walsh any significant family history of sudden cardiac death.  No recent illnesses or fevers.  She states that she noted dark stools prior to her recent admission but has not since.  Past Medical History:  Diagnosis Date  . Coronary artery disease   . Depression   . GI bleed 07/17/2015  . Hypertension   . Myocardial infarction ([place])   . Nocturia 08/14/2019  . Refusal of blood transfusions as patient is Jehovah's Witness     Patient Active Problem List   Diagnosis Date Noted  . GIB (gastrointestinal bleeding) 12/09/2020  . Nocturia 08/14/2019  . Melena 07/17/2015  . Incidental lung nodule, less than or equal to 49mm 07/17/2015  . Chest pain at rest 04/16/2014  . Essential hypertension 04/16/2014  . Morbid obesity ([place]) 04/16/2014  . Family history of early CAD 04/16/2014    Past Surgical History:  Procedure Laterality Date  .  ABDOMINAL HYSTERECTOMY    . BACK SURGERY    . BIOPSY  12/10/2020   Procedure: BIOPSY;  Surgeon: Ronnette Juniper, MD;  Location: [facility] ENDOSCOPY;  Service: Gastroenterology;;  . BIOPSY  12/11/2020   Procedure: BIOPSY;  Surgeon: Ronnette Juniper, MD;  Location[facility] ENDOSCOPY;  Service: Gastroenterology;;  . COLONOSCOPY WITH PROPOFOL N/A 12/11/2020   Procedure: COLONOSCOPY WITH PROPOFOL;  Surgeon: Ronnette Juniper, MD;  Location: [place];  Service: Gastroenterology;  Laterality: N/A;  . ESOPHAGOGASTRODUODENOSCOPY N/A 07/17/2015   Procedure: ESOPHAGOGASTRODUODENOSCOPY (EGD);  Surgeon: Wonda Horner, MD;  Location[facility] ENDOSCOPY;  Service: Endoscopy;  Laterality: N/A;  . ESOPHAGOGASTRODUODENOSCOPY (EGD) WITH PROPOFOL N/A 12/10/2020   Procedure: ESOPHAGOGASTRODUODENOSCOPY (EGD) WITH PROPOFOL;  Surgeon: Ronnette Juniper, MD;  Location: [place];  Service: Gastroenterology;  Laterality: N/A;  . HEMOSTASIS CLIP PLACEMENT  12/10/2020   Procedure: HEMOSTASIS CLIP PLACEMENT;  Surgeon: Ronnette Juniper, MD;  Location: [facility] ENDOSCOPY;  Service: Gastroenterology;;     OB History   No obstetric history on file.     Family History  Problem Relation Age of Onset  . Heart disease Father   . Heart disease Brother     Social History   Tobacco Use  . Smoking status: Never Smoker  . Smokeless tobacco: Never Used  Vaping Use  . Vaping Use: Never used  Substance  Use Topics  . Alcohol use: Yes    Comment: rare  . Drug use: No    Home Medications Prior to Admission medications   Medication Sig Start Date End Date Taking? Authorizing Provider  cyclobenzaprine (FLEXERIL) 5 MG tablet Take 1 tablet (5 mg total) by mouth 3 (three) times daily as needed for muscle spasms. 12/11/20   Kathie Dike, MD  DULoxetine (CYMBALTA) 60 MG capsule Take 60 mg by mouth daily. 11/26/20   [provider]  losartan-hydrochlorothiazide (HYZAAR) 50-12.5 MG tablet Take 1 tablet by mouth daily. 03/21/18   [provider]  Multiple  Vitamins-Minerals (MULTIVITAMIN ADULTS 50+) TABS Take 1 tablet by mouth daily.    [provider]  nitroGLYCERIN (NITROSTAT) 0.4 MG SL tablet Place 1 tablet (0.4 mg total) under the tongue every 5 (five) minutes as needed for chest pain. 2/45/80   Delora Fuel, MD  pantoprazole (PROTONIX) 40 MG tablet Take 40 mg by mouth daily.    [provider]  rosuvastatin (CRESTOR) 20 MG tablet Take 20 mg by mouth daily.    [provider]    Allergies    Patient has no known allergies.  Review of Systems   Review of Systems  Constitutional: Negative for fever.  Respiratory: Positive for chest tightness. Negative for shortness of breath.   Cardiovascular: Negative for chest pain.  Gastrointestinal: Negative for abdominal pain, blood in stool, nausea and vomiting.  Neurological: Positive for headaches.  Psychiatric/Behavioral: Negative for confusion.  All other systems reviewed and are negative.   Physical Exam Updated Vital Signs BP 131/67   Pulse 62   Temp 97.7 F (36.5 C) (Oral)   Resp 20   SpO2 100%   Physical Exam Vitals and nursing Walsh reviewed.  Constitutional:      Appearance: She is well-developed. She is diaphoretic.  HENT:     Head: Normocephalic and atraumatic.     Mouth/Throat:     Mouth: Mucous membranes are moist.  Eyes:     Pupils: Pupils are equal, round, and reactive to light.  Cardiovascular:     Rate and Rhythm: Normal rate and regular rhythm.     Heart sounds: Normal heart sounds.  Pulmonary:     Effort: Pulmonary effort is normal. No respiratory distress.     Breath sounds: No wheezing.  Abdominal:     General: Bowel sounds are normal.     Palpations: Abdomen is soft.     Tenderness: There is no abdominal tenderness. There is no guarding or rebound.  Musculoskeletal:     Cervical back: Neck supple.     Right lower leg: No edema.     Left lower leg: No edema.  Skin:    General: Skin is warm.  Neurological:     Mental Status:  She is alert and oriented to person, place, and time.     Comments: Cranial nerves II through XII intact, 5 out of 5 strength in all 4 extremities, no dysmetria to finger-nose-finger  Psychiatric:        Mood and Affect: Mood normal.     ED Results / Procedures / Treatments   Labs (all labs ordered are listed, but only abnormal results are displayed) Labs Reviewed  CBG MONITORING, ED - Abnormal; Notable for the following components:      Result Value   Glucose-Capillary 116 (*)    All other components within normal limits  BASIC METABOLIC PANEL  CBC  URINALYSIS, ROUTINE W REFLEX MICROSCOPIC  CBG  MONITORING, ED  TROPONIN I (HIGH SENSITIVITY)    EKG EKG Interpretation  Date/Time:  Thursday January 08 2021 03:56:12 EDT Ventricular Rate:  65 PR Interval:  166 QRS Duration: 78 QT Interval:  424 QTC Calculation: 440 R Axis:   43 Text Interpretation: Normal sinus rhythm Septal infarct , age undetermined Abnormal ECG Confirmed by Thayer Jew [phone]) on 01/08/2021 4:02:40 AM   Radiology No results found.  Procedures Procedures   Medications Ordered in ED Medications  sodium chloride 0.9 % bolus 1,000 mL (has no administration in time range)    ED Course  I have reviewed the triage vital signs and the nursing notes.  Pertinent labs & imaging results that were available during my care of the patient were reviewed by me and considered in my medical decision making (see chart for details).    MDM Rules/Calculators/A&P                          Patient presents after syncopal episode.  She is a visitor when she fell asleep woke up and syncopized.  She states she has a history of the same.  She also has a recent history of upper GI bleed.  On my evaluation just after initial event, patient was slightly diaphoretic but awake, alert, oriented.  No seizure-like activity noted.  She was registered.  She is having some whole body pain, headache, chest discomfort.  EKG shows no  evidence of acute ischemia or arrhythmia.  Initial troponin is reassuring.  BMP with slight hypokalemia and slight AKI.  She clinically may be dry.  Orthostatics are reassuring.  CBG normal.  Chest x-ray without evidence of pneumothorax or pneumonia.  Patient has returned to her baseline.  Hemoglobin has rebounded from recent GI bleed.  This is very reassuring.  Will repeat troponin.  Disposition pending repeat troponin.  Discussed with patient possibility of discharge with close follow-up versus observation admission either of which I feel is reasonable as she has back to her baseline and highly suspect vasovagal episode with a component of dehydration.  Signed out to oncoming provider.  Final Clinical Impression(s) / ED Diagnoses Final diagnoses:  Syncope, unspecified syncope type    Rx / DC Orders ED Discharge Orders    None       Merryl Hacker, MD 01/11/21 2319

## 2021-01-08 NOTE — ED Triage Notes (Addendum)
Pt was visiting w/ a family member when she became dizzy and had a near syncopal episode.  Pt was diaphoretic and appeared SOB.   She reports prior episodes of these symptoms.

## 2021-01-08 NOTE — ED Notes (Addendum)
Pt a/o x4. States that she does not remember falling, states waking up on the floor and that this has happened before. Pt in Bed, Both side rails up, Yellow Socks on, Yellow armband placed, Stretcher locked and in the lowest position, Call light in reach, This RN instructed pt to not get out of bed without assistance from staff, Chrisney placed outside room door.

## 2021-01-08 NOTE — Discharge Instructions (Addendum)
You have been seen and discharged from the emergency department.  Your heart work-up was normal.  Stay well-hydrated.  Follow-up with your primary provider for reevaluation and further care. Take home medications as prescribed. If you have any worsening symptoms or further concerns for your health please return to an emergency department for further evaluation.

## 2021-01-08 NOTE — ED Notes (Addendum)
Pt (CS) noted to be a visitor for another pt in this ED assigned to this RN. CS noted to be lying on floor near room door yelling for help. This nurse and EMT to scene to assess sitiation and provide aide. Hosting patient states that CS was sitting in chair when when collapsed forward onto floor landing near cabinets. CS reports crawling to door up recognizance to get help. Provider at scene of incident to assess pt and perform MSE. Staff assist x3 to help CS from floor to stretcher. Vital signs and  CBG obtained per provider verbal order. Pt transported to triage for further evaluation.

## 2021-01-08 NOTE — ED Notes (Signed)
I assisted pt walking to the bathroom and pt gait was unsteady and she stated she felt weak. After the pt used the bathroom and we got back to the room, I did the pt's orthostatic VS and she stated she did not feel weak like she did when she first got up.

## 2021-01-08 NOTE — ED Provider Notes (Signed)
Patient signed out to me by previous provider.  Please refer to their note for full HPI.  Patient presents with syncope and headache.  Work-up thus far has been unremarkable, CT of the head showed no acute finding.  Cardiac work-up up to this point is been normal.  We are pending repeat troponin. Physical Exam  BP (!) 157/90   Pulse 60   Temp 97.8 F (36.6 C) (Oral)   Resp 14   Ht 5\' 4"  (1.626 m)   Wt 80.7 kg   SpO2 100%   BMI 30.55 kg/m   Physical Exam Vitals and nursing note reviewed.  Constitutional:      Appearance: Normal appearance.  HENT:     Head: Normocephalic.     Mouth/Throat:     Mouth: Mucous membranes are moist.  Cardiovascular:     Rate and Rhythm: Normal rate.  Pulmonary:     Effort: Pulmonary effort is normal. No respiratory distress.  Abdominal:     Palpations: Abdomen is soft.     Tenderness: There is no abdominal tenderness.  Skin:    General: Skin is warm.  Neurological:     Mental Status: She is alert and oriented to person, place, and time. Mental status is at baseline.  Psychiatric:        Mood and Affect: Mood normal.     ED Course/Procedures     Procedures  MDM  Repeat troponin is negative with only a delta of 1.  On reevaluation patient is laying in bed, well, no complaints, normal vitals.  She is ambulated without difficulty.  She states she feels baseline.  No indication for further emergent evaluation/admission in the setting of syncope.  Discussed with the patient her results and recommended outpatient follow-up.  Patient will be discharged and treated as an outpatient.  Discharge plan and strict return to ED precautions discussed, patient verbalizes understanding and agreement.       Lorelle Gibbs, [place] 01/08/21 [id number]

## 2021-01-08 NOTE — ED Notes (Signed)
Pt is NSR on monitor 

## 2021-01-23 ENCOUNTER — Ambulatory Visit
Admission: RE | Admit: 2021-01-23 | Discharge: 2021-01-23 | Disposition: A | Discharge status: Home / Self Care | Payer: Medicare Other | Source: Ambulatory Visit | Attending: Gastroenterology | Admitting: Gastroenterology

## 2021-01-23 ENCOUNTER — Other Ambulatory Visit: Payer: Self-pay

## 2021-01-23 DIAGNOSIS — K317 Polyp of stomach and duodenum: Secondary | ICD-10-CM

## 2021-01-23 MED ORDER — IOPAMIDOL (ISOVUE-300) INJECTION 61%
100.0000 mL | Freq: Once | INTRAVENOUS | Status: AC | PRN
  Administered 2021-01-23: 100 mL via INTRAVENOUS

## 2021-02-04 ENCOUNTER — Other Ambulatory Visit (HOSPITAL_COMMUNITY): Payer: Self-pay | Admitting: Gastroenterology

## 2021-02-04 DIAGNOSIS — R935 Abnormal findings on diagnostic imaging of other abdominal regions, including retroperitoneum: Secondary | ICD-10-CM

## 2021-02-16 ENCOUNTER — Ambulatory Visit (HOSPITAL_COMMUNITY)
Admission: RE | Admit: 2021-02-16 | Discharge: 2021-02-16 | Disposition: A | Discharge status: Home / Self Care | Payer: Medicare Other | Source: Ambulatory Visit | Attending: Gastroenterology | Admitting: Gastroenterology

## 2021-02-16 DIAGNOSIS — R935 Abnormal findings on diagnostic imaging of other abdominal regions, including retroperitoneum: Secondary | ICD-10-CM | POA: Insufficient documentation

## 2021-02-16 MED ORDER — GALLIUM GA 68 DOTATATE IV KIT
4.4000 | PACK | Freq: Once | INTRAVENOUS | Status: AC | PRN
  Administered 2021-02-16: 4.4 via INTRAVENOUS

## 2021-02-18 ENCOUNTER — Telehealth: Payer: Self-pay | Admitting: Nurse Practitioner

## 2021-02-18 NOTE — Telephone Encounter (Signed)
Scheduled appt per 6/6 referral. Pt aware.

## 2021-02-25 ENCOUNTER — Other Ambulatory Visit: Payer: Self-pay

## 2021-02-25 ENCOUNTER — Inpatient Hospital Stay: Payer: Medicare Other | Attending: Nurse Practitioner | Admitting: Nurse Practitioner

## 2021-02-25 ENCOUNTER — Encounter: Payer: Self-pay | Admitting: Nurse Practitioner

## 2021-02-25 VITALS — BP 158/80 | HR 70 | Temp 97.9°F | Resp 18 | Ht 64.0 in | Wt 184.5 lb

## 2021-02-25 DIAGNOSIS — M549 Dorsalgia, unspecified: Secondary | ICD-10-CM | POA: Insufficient documentation

## 2021-02-25 DIAGNOSIS — D509 Iron deficiency anemia, unspecified: Secondary | ICD-10-CM | POA: Insufficient documentation

## 2021-02-25 DIAGNOSIS — C7A019 Malignant carcinoid tumor of the small intestine, unspecified portion: Secondary | ICD-10-CM

## 2021-02-25 DIAGNOSIS — F419 Anxiety disorder, unspecified: Secondary | ICD-10-CM | POA: Diagnosis not present

## 2021-02-25 DIAGNOSIS — R197 Diarrhea, unspecified: Secondary | ICD-10-CM | POA: Diagnosis not present

## 2021-02-25 DIAGNOSIS — Z79899 Other long term (current) drug therapy: Secondary | ICD-10-CM | POA: Diagnosis not present

## 2021-02-25 DIAGNOSIS — R109 Unspecified abdominal pain: Secondary | ICD-10-CM | POA: Diagnosis not present

## 2021-02-25 DIAGNOSIS — I10 Essential (primary) hypertension: Secondary | ICD-10-CM | POA: Diagnosis not present

## 2021-02-25 DIAGNOSIS — G8929 Other chronic pain: Secondary | ICD-10-CM | POA: Diagnosis not present

## 2021-02-25 DIAGNOSIS — Z803 Family history of malignant neoplasm of breast: Secondary | ICD-10-CM | POA: Insufficient documentation

## 2021-02-25 DIAGNOSIS — C7A8 Other malignant neuroendocrine tumors: Secondary | ICD-10-CM | POA: Insufficient documentation

## 2021-02-25 DIAGNOSIS — I2510 Atherosclerotic heart disease of native coronary artery without angina pectoris: Secondary | ICD-10-CM | POA: Insufficient documentation

## 2021-02-25 DIAGNOSIS — Z808 Family history of malignant neoplasm of other organs or systems: Secondary | ICD-10-CM | POA: Insufficient documentation

## 2021-02-25 DIAGNOSIS — R634 Abnormal weight loss: Secondary | ICD-10-CM | POA: Diagnosis not present

## 2021-02-25 DIAGNOSIS — D123 Benign neoplasm of transverse colon: Secondary | ICD-10-CM | POA: Diagnosis not present

## 2021-02-25 DIAGNOSIS — R7303 Prediabetes: Secondary | ICD-10-CM | POA: Insufficient documentation

## 2021-02-25 DIAGNOSIS — F32A Depression, unspecified: Secondary | ICD-10-CM | POA: Diagnosis not present

## 2021-02-25 DIAGNOSIS — C7B8 Other secondary neuroendocrine tumors: Secondary | ICD-10-CM | POA: Insufficient documentation

## 2021-02-25 DIAGNOSIS — R519 Headache, unspecified: Secondary | ICD-10-CM | POA: Diagnosis not present

## 2021-02-25 NOTE — Progress Notes (Addendum)
Tanya Walsh   Telephone:([phone]) [phone] Fax:([phone]) [place] Note   Patient Care Team: Katherina Mires, MD as PCP - General (Family Medicine) Sueanne Margarita, MD as PCP - Sleep Medicine (Cardiology) Belva Crome, MD as PCP - Cardiology (Cardiology) Pieter Partridge, DO as Consulting Physician (Neurology) Alla Feeling, NP as Nurse Practitioner (Nurse Practitioner) Truitt Merle, MD as Consulting Physician (Oncology) Jonnie Finner, RN as Oncology Nurse Navigator 02/26/2021  CHIEF COMPLAINTS/PURPOSE OF CONSULTATION:  Metastatic neuroendocrine tumor, referred by Dr. Therisa Doyne  Summary of oncology history Oncology History  Primary malignant neuroendocrine tumor of small intestine ([place])  12/10/2020 Procedure   Upper endoscopy, Dr. Ronnette Juniper Impression - Normal upper third of esophagus, middle third of esophagus and lower third of esophagus. - Z-line regular, 38 cm from the incisors. - Erythematous mucosa in the antrum. Biopsied. Clip (MR conditional) was placed. - Normal examined duodenum. Biopsied.   12/10/2020 Pathology Results   FINAL MICROSCOPIC DIAGNOSIS:   A. DUODENUM, BIOPSY:  - Duodenal mucosa with no significant pathologic findings.  - Negative for increased intraepithelial lymphocytes and villous  architectural changes.   B. STOMACH, ANTRUM, BIOPSY:  - Reactive gastropathy.  - Warthin-Starry stain is negative for Helicobacter pylori.    12/11/2020 Procedure   Colonoscopy, Dr. Ronnette Juniper impression - One 5 mm polyp in the transverse colon, removed piecemeal using a cold biopsy forceps. Resected and retrieved. - Diverticulosis in the sigmoid colon, in the descending colon and in the transverse colon. - The examined portion of the ileum was normal. - Non-bleeding internal hemorrhoids.   12/11/2020 Pathology Results   FINAL MICROSCOPIC DIAGNOSIS:   A. COLON, TRANSVERSE, POLYPECTOMY:  - Tubular adenoma.  - Negative for high grade  dysplasia.    12/24/2020 Procedure   Capsule endoscopy report: A polypoid nodular growth was noted at 3 hours and 30 minutes and an ulcerated, polypoid growth was noted at 3 hours and 54 minutes which is likely the cause of obscure GI blood loss   01/23/2021 Imaging   CT entero AP w contrast IMPRESSION: 1. No acute findings identified within the abdomen or pelvis. 2. There are several focal areas of intraluminal hyperenhancement within the small bowel loops. The largest is in the nondilated mid to distal jejunum measuring 1.3 cm. Cannot rule out underlying small bowel neoplasm. Correlation with tissue sampling results advised. Correlation with results from capsule endoscopy. 3. There is a enhancing soft tissue nodule with central calcification within the central small bowel mesentery. This is nonspecific and may represent sequelae of inflammation/infection. Primary differential considerations include carcinoid tumor or metastatic adenopathy. If there is a clinical concern for carcinoid tumor consider further investigation with DOTATATE PET. 4. Left adrenal gland adenoma. 5. Distal colonic diverticulosis without signs of acute diverticulitis. 6. Aortic atherosclerosis.   02/16/2021 PET scan   IMPRESSION: 1. Evidence of well differentiated small bowel neuroendocrine tumor with mesenteric metastasis. 2. Approximately 7 discrete small bowel lesions with intense radiotracer activity. Lesion depicted on comparison CT enterography. 3. Two intensely radiotracer avid mesenteric implants centrally within the small bowel mesentery. 4. Single hepatic metastasis with a second potential hepatic metastasis versus peritoneal implant. 5. No small bowel obstruction. 6. No evidence of metastatic disease outside the abdomen pelvis. 7. Patient may be a candidate for peptide receptor radiotherapy (Lu-177 DOTATATE) available at [place] [phone]).   02/26/2021 Initial  Diagnosis   Primary malignant neuroendocrine tumor of small intestine ([place])  02/26/2021 Cancer Staging   Staging form: Gastrointestinal Stromal Tumor - Small Intestinal, Esophageal, Colorectal, Mesenteric, and Peritoneal GIST, AJCC 8th Edition - Clinical: Stage IV (cTX, cN0, pM1) - Signed by Alla Feeling, NP on 02/26/2021       HISTORY OF PRESENTING ILLNESS:  Tanya Walsh 65 y.o. female with history of HTN, pre-DM, GERD, diverticulosis, low back pain with falls, and h/o MI in 2005 is here because of newly diagnosed NET. She initially presented to ED from PCP with anemia and severe iron deficiency Hg 9, ferritin 7 and iron sat 19%. She underwent EGD by Dr. Therisa Doyne on 12/10/20 which showed moderate erythema without bleeding in the gastric antrum. Colonoscopy showed 1 polyp in the transverse colon, sigmoid diverticulosis, and non bleeding internal hemorrhoids. EGD biopsy was negative for malignancy and H. Pylori; transverse colon polyp was tubular adenoma. Subsequently underwent capsule endoscopy 12/24/20 which showed 2 small bowel nodular polypoid lesions, one with ulceration which was felt the likely source of GI blood loss. Due to worsening abdominal pain she had CT entero A/P on 01/23/21 which showed several focal areas of intraluminal hyperenhancement within the small bowel loops and enhancing soft tissue nodule with central calcification within the central small bowel mesentery which is technically nonspecific but suspicious for malignancy.  PET dotatate on 02/16/2021 showed approximately 7 discrete small bowel lesions with intense radiotracer activity, two intensely avid mesenteric implants centrally within the small bowel mesentery, a single hepatic metastasis with a second potential hepatic metastasis first peritoneal implant, consistent with well-differentiated small bowel neuroendocrine tumor with mesenteric and hepatic metastasis.  Socially, she moved from [place] in 2005 to live with her  daughter.  She is retired from domestic work.  She has 3 children, healthy.  Independent with ADLs.  She drinks wine occasionally but not regularly.  Denies tobacco and other drug use.  She has significant family history for brain cancer in her mother, breast cancer in 2 nieces, 1 diagnosed in her 22s and 1 at age 38.  Today, Ms. Hor presents with her daughter.  She has persistent abdominal pain and left upper abdomen/side pain that is a 2-3 out of 10 but occasionally flares and can be severe.  Manages with ice, heat, and Tylenol.  Her weight has fluctuated since last summer, weight 193 last year.  Her stools have been dark while taking a multivitamin, then turned "black" in March/2022. Stools are soft and solid, denies diarrhea. Has occasional nausea but no vomiting. She also had several syncopal episodes. Early this year she developed flushing. She has h/o headaches but are worse lately, on left side, with occasional blurry vision. She has seen a neurologist in the past.   MEDICAL HISTORY:  Past Medical History:  Diagnosis Date   Coronary artery disease    Depression    GI bleed 07/17/2015   Hypertension    Myocardial infarction [facility])    Nocturia 08/14/2019   Refusal of blood transfusions as patient is Jehovah's Witness     SURGICAL HISTORY: Past Surgical History:  Procedure Laterality Date   ABDOMINAL HYSTERECTOMY     BACK SURGERY     BIOPSY  12/10/2020   Procedure: BIOPSY;  Surgeon: Ronnette Juniper, MD;  Location[facility] ENDOSCOPY;  Service: Gastroenterology;;   BIOPSY  12/11/2020   Procedure: BIOPSY;  Surgeon: Ronnette Juniper, MD;  Location[facility] ENDOSCOPY;  Service: Gastroenterology;;   COLONOSCOPY WITH PROPOFOL N/A 12/11/2020   Procedure: COLONOSCOPY WITH PROPOFOL;  Surgeon: Ronnette Juniper, MD;  Location: [facility]  ENDOSCOPY;  Service: Gastroenterology;  Laterality: N/A;   ESOPHAGOGASTRODUODENOSCOPY N/A 07/17/2015   Procedure: ESOPHAGOGASTRODUODENOSCOPY (EGD);  Surgeon: Wonda Horner, MD;  Location: [facility]  ENDOSCOPY;  Service: Endoscopy;  Laterality: N/A;   ESOPHAGOGASTRODUODENOSCOPY (EGD) WITH PROPOFOL N/A 12/10/2020   Procedure: ESOPHAGOGASTRODUODENOSCOPY (EGD) WITH PROPOFOL;  Surgeon: Ronnette Juniper, MD;  Location: [place];  Service: Gastroenterology;  Laterality: N/A;   HEMOSTASIS CLIP PLACEMENT  12/10/2020   Procedure: HEMOSTASIS CLIP PLACEMENT;  Surgeon: Ronnette Juniper, MD;  Location: [facility] ENDOSCOPY;  Service: Gastroenterology;;    SOCIAL HISTORY: Social History   Socioeconomic History   Marital status: Widowed    Spouse name: Not on file   Number of children: Not on file   Years of education: Not on file   Highest education level: Not on file  Occupational History   Not on file  Tobacco Use   Smoking status: Never   Smokeless tobacco: Never  Vaping Use   Vaping Use: Never used  Substance and Sexual Activity   Alcohol use: Yes    Comment: rare   Drug use: No   Sexual activity: Not on file  Other Topics Concern   Not on file  Social History Narrative   Right handed   Drinks caffeine   One story home   Social Determinants of Health   Financial Resource Strain: Not on file  Food Insecurity: Not on file  Transportation Needs: Not on file  Physical Activity: Not on file  Stress: Not on file  Social Connections: Not on file  Intimate Partner Violence: Not on file    FAMILY HISTORY: Family History  Problem Relation Age of Onset   Heart disease Father    Heart disease Brother     ALLERGIES:  has No Known Allergies.  MEDICATIONS:  Current Outpatient Medications  Medication Sig Dispense Refill   cyclobenzaprine (FLEXERIL) 5 MG tablet Take 1 tablet (5 mg total) by mouth 3 (three) times daily as needed for muscle spasms. 30 tablet 0   DULoxetine (CYMBALTA) 30 MG capsule Take 30 mg by mouth daily.     losartan-hydrochlorothiazide (HYZAAR) 50-12.5 MG tablet Take 1 tablet by mouth daily.     Multiple Vitamins-Minerals (MULTIVITAMIN ADULTS 50+) TABS Take 1 tablet by mouth  daily.     nitroGLYCERIN (NITROSTAT) 0.4 MG SL tablet Place 1 tablet (0.4 mg total) under the tongue every 5 (five) minutes as needed for chest pain. 30 tablet 0   pantoprazole (PROTONIX) 40 MG tablet Take 40 mg by mouth daily.     rosuvastatin (CRESTOR) 20 MG tablet Take 20 mg by mouth daily.     No current facility-administered medications for this visit.    REVIEW OF SYSTEMS:   Constitutional: Denies fevers, chills or abnormal night sweats (+) mild weight loss (+) flushing  Eyes: Denies double vision or watery eyes (+) blurry vision with headaches  Ears, nose, mouth, throat, and face: Denies mucositis or sore throat Respiratory: Denies cough, dyspnea or wheezes Cardiovascular: Denies palpitation, chest discomfort or lower extremity swelling (+) h/o MI 2005 Gastrointestinal:  Denies heartburn or change in bowel habits (+) abdominal pain (+) nausea (+) soft/solid stools  Skin: Denies abnormal skin rashes Lymphatics: Denies new lymphadenopathy or easy bruising Neurological:Denies numbness, tingling or new weaknesses (+) headaches  Behavioral/Psych: Mood is stable, no new changes  All other systems were reviewed with the patient and are negative.  PHYSICAL EXAMINATION: ECOG PERFORMANCE STATUS: 1 - Symptomatic but completely ambulatory  Vitals:   02/25/21 1041  BP: (!) 158/80  Pulse: 70  Resp: 18  Temp: 97.9 F (36.6 C)  SpO2: 100%   Filed Weights   02/25/21 1041  Weight: 184 lb 8 oz (83.7 kg)    GENERAL:alert, no distress and comfortable SKIN: no rash  EYES: sclera clear NECK: without mass LYMPH:  no palpable cervical or supraclavicular lymphadenopathy  LUNGS: clear with normal breathing effort HEART: regular rate & rhythm, no lower extremity edema ABDOMEN:abdomen soft, non-tender and normal bowel sounds PSYCH: alert & oriented x 3 with fluent speech NEURO: no focal motor/sensory deficits  LABORATORY DATA:  I have reviewed the data as listed CBC Latest Ref Rng &  Units 01/08/2021 12/11/2020 12/10/2020  WBC 4.0 - 10.5 K/uL 6.3 4.7 4.2  Hemoglobin 12.0 - 15.0 g/dL 12.4 8.1(L) 8.0(L)  Hematocrit 36.0 - 46.0 % 41.7 27.1(L) 27.1(L)  Platelets 150 - 400 K/uL 281 309 346   CMP Latest Ref Rng & Units 01/08/2021 12/11/2020 12/10/2020  Glucose 70 - 99 mg/dL 128(H) 92 95  BUN 8 - 23 mg/dL 16 <5(L) 5(L)  Creatinine 0.44 - 1.00 mg/dL 1.04(H) 0.74 0.84  Sodium 135 - 145 mmol/L 140 140 137  Potassium 3.5 - 5.1 mmol/L 3.4(L) 3.6 3.4(L)  Chloride 98 - 111 mmol/L 103 108 105  CO2 22 - 32 mmol/L 26 26 27   Calcium 8.9 - 10.3 mg/dL 9.6 8.8(L) 8.7(L)  Total Protein 6.5 - 8.1 g/dL - - 5.9(L)  Total Bilirubin 0.3 - 1.2 mg/dL - - 0.6  Alkaline Phos 38 - 126 U/L - - 44  AST 15 - 41 U/L - - 19  ALT 0 - 44 U/L - - 15     RADIOGRAPHIC STUDIES: I have personally reviewed the radiological images as listed and agreed with the findings in the report. NM PET (NETSPOT GA 60 DOTATATE) SKULL BASE TO MID THIGH  Result Date: 02/16/2021 CLINICAL DATA:  Gastric polyp. Abdominal pain. Mesenteric nodularity on CT. EXAM: NUCLEAR MEDICINE PET SKULL BASE TO THIGH TECHNIQUE: 4.4 mCi gallium 68 DOTATATE was injected intravenously. Full-ring PET imaging was performed from the skull base to thigh after the radiotracer. CT data was obtained and used for attenuation correction and anatomic localization. COMPARISON:  CT enterography 01/23/2021 FINDINGS: NECK No radiotracer activity in neck lymph nodes. Incidental CT findings: None CHEST No radiotracer accumulation within mediastinal or hilar lymph nodes. No suspicious pulmonary nodules on the CT scan. Incidental CT finding:None ABDOMEN/PELVIS Two nodular lesions within the central small bowel mesentery have intense radiotracer activity. For example 13 mm nodule in the RIGHT central mesentery with a tiny central calcification (image 112) ahs intense radiotracer activity SUV max equal 16.7. A similar lesion on image 117 with SUV max equal 13. There are 7  discrete foci of radiotracer associated with the small bowel. Lesions are intense. For example 2 adjacent lesions in the mid ventral small bowel on image 113 with SUV max equal 27. Two adjacent lesions midline on image 129 with SUV max equal 19.8. These lesions are not seen on the noncontrast CT. On comparison CT enterography, there are enhancing lesions within the small bowel which are favored to correlate with the radiotracer activity. Discrete lesion in the subcapsular RIGHT hepatic lobe measuring approximately 5-10 mm with SUV max equal 15.1 on image 90 is consistent with a liver metastasis. Intense activity along the inferior margin of the RIGHT hepatic lobe is difficult to localize ( SUV max equal 22.9 on image 107). Lesion either within liver or adjacent peritoneal space.  Physiologic activity noted in the liver, spleen, adrenal glands and kidneys. Incidental CT findings:None SKELETON No focal activity to suggest skeletal metastasis. Incidental CT findings:None IMPRESSION: 1. Evidence of well differentiated small bowel neuroendocrine tumor with mesenteric metastasis. 2. Approximately 7 discrete small bowel lesions with intense radiotracer activity. Lesion depicted on comparison CT enterography. 3. Two intensely radiotracer avid mesenteric implants centrally within the small bowel mesentery. 4. Single hepatic metastasis with a second potential hepatic metastasis versus peritoneal implant. 5. No small bowel obstruction. 6. No evidence of metastatic disease outside the abdomen pelvis. 7. Patient may be a candidate for peptide receptor radiotherapy (Lu-177 DOTATATE) available at [place] [phone]). These results will be called to the ordering clinician or representative by the Radiologist Assistant, and communication documented in the PACS or [employer]. Electronically Signed   By: Suzy Bouchard M.D.   On: 02/16/2021 09:48    ASSESSMENT & PLAN: 65 year old  female  1.Metastatic neuroendocrine tumor of small bowel, to mesentery and liver, stage IV -We reviewed her medical record including labs, imaging, and diagnostic work-up in great detail -she presented with symptomatic anemia from GI bleeding, abdominal pain, and flushing in 11/2020, work up showed 2 gastric polyps one of which was ulcerated. -Given the sensitivity of dotatate PET scan, findings are diagnostic for metastatic neuroendocrine tumor of the small bowel to the mesentery and liver.  We do not feel a biopsy is necessary for confirmation but will be needed for Lutathera treatment down the line -Her case was discussed in GI tumor board, she is not a surgical candidate due to metastatic disease.  IR Dr. Mare Ferrari has agreed to do CT guided liver biopsy -We discussed the nature of neuroendocrine tumors.  Although not curable this is still treatable, the goal is palliative, to improve her symptoms (abdominal pain and flushing), disease control, and to prolong her life. -Dr. Burr Medico recommends first-line Sandostatin injections, potential benefit and side effects including but not limited to fatigue, hypertension, GI discomfort, hematologic changes, metabolic abnormalities, and local pain at injection site were reviewed.  She was given printed information.  She agrees to proceed. -plan to see her back in 2-3 weeks which will be after liver biopsy, to review results and start treatment   2.  Iron deficiency anemia -Secondary to ulcerated gastric polyp, #1 -On 12/10/2020 Hg 8.0, MCV 79.9, serum iron 16, TIBC 391, 4% saturation, ferritin of 5.  L89 and folic acid are normal.  Labs consistent with iron deficiency anemia -On PPI and multivitamin with Fe -Repeat Hg 02/04/2021 normalized to 13.4 -monitoring  3. Abdominal pain, flushing -Secondary to #1, managed with tylenol  -Given the multiple small bowel lesions and mesenteric metastasis, she is at high risk for bowel obstruction. We reviewed s/sx  -We  recommend low residue diet.  She was given printed material and referred to dietitian. -Recommend to begin stool softener/laxative to avoid obstruction  4. Headaches S-he has chronic headaches prior to her diagnosis, but worsened lately and mostly on the left side -We discussed brain metastasis from NET is rare -Normal head CT 02/2020 -Last seen by neuro Dr. Tomi Likens on 03/26/2020.   He felt possibly headaches were related to fibromyalgia -CT head without contrast 01/08/2021 showed no acute abnormality, and chronic right ethmoidal paranasal sinus disease -Follow-up with neuro for headaches  5. CAD, HTN, pre-- DM -On losartan-HCTZ and rosuvastatin -Follow-up with PCP and cardiology  PLAN: -Work up reviewed -Liver biopsy  -Lab, f/up, and start sandostatin in 2-3  weeks -Low res diet, referred to dietician  -F/up with neuro for headaches    Orders Placed This Encounter  Procedures   US BIOPSY (LIVER)    Dr. Laurence Ferrari approved 02/25/21 tumor board discussion. Confirm metastatic neuroendocrine tumor from small bowel    Standing Status:   Standing    Number of Occurrences:   1    Order Specific Question:   Lab orders requested (DO NOT place separate lab orders, these will be automatically ordered during procedure specimen collection):    Answer:   Surgical Pathology    Order Specific Question:   Can the patient sign their own consent?    Answer:   Yes    Order Specific Question:   Symptom/Reason for Exam    Answer:   Diagnosis of malignant neoplasm based on investigation without tissue diagnosis ([place]) [[id number]]   CBC with Differential ([place] Only)    Standing Status:   Standing    Number of Occurrences:   50    Standing Expiration Date:   02/25/2022   CMP ([place] only)    Standing Status:   Standing    Number of Occurrences:   50    Standing Expiration Date:   02/25/2022   Chromogranin A    Standing Status:   Standing    Number of Occurrences:   50    Standing Expiration  Date:   02/25/2022   24 Hr Ur 5 HIAA Qnt    Standing Status:   Standing    Number of Occurrences:   50    Standing Expiration Date:   02/25/2022   Ferritin    Standing Status:   Standing    Number of Occurrences:   50    Standing Expiration Date:   02/25/2022   Iron and TIBC    Standing Status:   Standing    Number of Occurrences:   50    Standing Expiration Date:   02/25/2022   Ambulatory Referral to [facility] Nutrition    Referral Priority:   Routine    Referral Type:   Consultation    Referral Reason:   Specialty Services Required    Number of Visits Requested:   1    All questions were answered. The patient knows to call the clinic with any problems, questions or concerns.     Alla Feeling, NP 02/26/2021    Addendum  I have seen the patient, examined her. I agree with the assessment and and plan and have edited the notes.   65 yo female with PMH of HTN, CAD, presents with intermittent abdominal pain for few months.  I reviewed to her images including CT and dotatate PET scan in person, and discussed with pt and her daughter in detail.  Due to the high specificity of dotatate PET scan, her imaging was diagnostic for metastatic neuroendocrine tumor from small bowel.  We have reviewed her case in our GI tumor board this morning, tissue biopsy is still required if she undergo Lutathera treatment in the future, and IR will do ultrasound-guided CT biopsy.  I discussed the incurable nature of her disease, slow-growing nature of her disease, and treatment options.  I recommend first-line treatment with Sandostatin injection, benefit and side effects discussed with her in detail, she agrees to proceed.  Her abdominal pain is currently controlled by tramadol, she has intermittent flushing, no significant diarrhea.  We discussed the risk of small bowel obstruction from multiple new endocrine tumors in her small  bowel (7 on PET), and low residual diet, using laxatives, to avoid bowel obstruction. She  voiced good understanding, and will arrange dietitian consult also.  All questions were answered, I plan to see her back in 2 to 3 weeks to review biopsy results, and start first dose Sandostatin injection.  Truitt Merle  02/25/2021

## 2021-02-25 NOTE — Progress Notes (Signed)
Met with patient and her daughter Tanya Walsh today at her initial medical oncology consult with [name] NP and Dr. Truitt Merle.  I introduced myself and explained my role as nurse navigator.  They were both given my direct contact information and encouraged to call with questions or concerns.  There were no barriers to treatment identified.  The patient has good support from her oldest daughter who lives locally.  She reports her husband passed away a couple of years from brain cancer.  She states she continues to eat well, no constipation or diarrhea.  She understands we may have to do additional diagnostic work up to have all the answers for her to determine a treatment plan.

## 2021-02-25 NOTE — Progress Notes (Signed)
The proposed treatment discussed in conference is for discussion purposes only and is not a binding recommendation.  The patients have not been physically examined, or presented with their treatment options.  Therefore, final treatment plans cannot be decided.   

## 2021-02-26 ENCOUNTER — Encounter: Payer: Self-pay | Admitting: Hematology

## 2021-02-26 ENCOUNTER — Encounter: Payer: Self-pay | Admitting: Nurse Practitioner

## 2021-02-26 DIAGNOSIS — C7A8 Other malignant neuroendocrine tumors: Secondary | ICD-10-CM | POA: Insufficient documentation

## 2021-02-26 HISTORY — DX: Other malignant neuroendocrine tumors: C7A.8

## 2021-02-27 ENCOUNTER — Other Ambulatory Visit: Payer: Self-pay

## 2021-02-27 DIAGNOSIS — C7A019 Malignant carcinoid tumor of the small intestine, unspecified portion: Secondary | ICD-10-CM

## 2021-03-02 ENCOUNTER — Encounter (HOSPITAL_COMMUNITY): Payer: Self-pay

## 2021-03-02 NOTE — Progress Notes (Unsigned)
       Patient Demographics  Patient Name  Tanya Walsh, Tanya Walsh Legal Sex  Female DOB  November 20, 1955 SSN  [id number] Address  [place] [address] Phone  [phone] [facility])  [phone] (Mobile) *Preferred*     RE: Biopsy Received: 3 days ago Markus Daft, MD  Lenore Cordia Ok to schedule for liver biopsy.  Small lesion and may not be visible.  May want to look with [place] on the day of procedure before patient gets IV and labs.   [place]         Previous Messages    ----- Message -----  From: Lenore Cordia  Sent: 02/27/2021  11:24 AM EDT  To: Jillyn Hidden, Ir Procedure Requests  Subject: Biopsy                                         Procedure Requested: US Biopsy (Liver)    Reason for Procedure: mesenteric neuroendocrine with possible mets to liver    Provider Requesting:  Alla Feeling  Provider Telephone:  [phone]    Other Info:

## 2021-03-05 ENCOUNTER — Other Ambulatory Visit: Payer: Self-pay | Admitting: Radiology

## 2021-03-06 ENCOUNTER — Other Ambulatory Visit: Payer: Self-pay

## 2021-03-06 ENCOUNTER — Encounter (HOSPITAL_COMMUNITY): Payer: Self-pay

## 2021-03-06 ENCOUNTER — Ambulatory Visit (HOSPITAL_COMMUNITY)
Admission: RE | Admit: 2021-03-06 | Discharge: 2021-03-06 | Disposition: A | Discharge status: Home / Self Care | Payer: Medicare Other | Source: Ambulatory Visit | Attending: Nurse Practitioner | Admitting: Nurse Practitioner

## 2021-03-06 ENCOUNTER — Ambulatory Visit (HOSPITAL_COMMUNITY)
Admission: RE | Admit: 2021-03-06 | Discharge: 2021-03-06 | Disposition: A | Discharge status: Home / Self Care | Payer: Medicare Other | Source: Ambulatory Visit | Attending: Physician Assistant | Admitting: Physician Assistant

## 2021-03-06 DIAGNOSIS — K769 Liver disease, unspecified: Secondary | ICD-10-CM | POA: Diagnosis present

## 2021-03-06 DIAGNOSIS — C7A019 Malignant carcinoid tumor of the small intestine, unspecified portion: Secondary | ICD-10-CM

## 2021-03-06 NOTE — Procedures (Signed)
  Procedure: US liver limited posterior R lobe PET+ lesion not visible on [place]. Not good location for CT guided approach. Consider laparascopic biopsy if tissue is needed? EBL:   minimal Complications:  none immediate  See full dictation in [employer].  Dillard Cannon MD Main # [phone] Pager  [phone]

## 2021-03-09 NOTE — Progress Notes (Signed)
Per Dr. Burr Medico called patient back and informed to proceed with double lumen procedure on 03/13/2021 at [facility] as scheduled as they may be able to obtain tissue for pathology.  She will keep her already scheduled appointment for 03/12/2021 with Dr. Burr Medico.

## 2021-03-10 ENCOUNTER — Encounter: Payer: Self-pay | Admitting: Hematology

## 2021-03-10 DIAGNOSIS — I2510 Atherosclerotic heart disease of native coronary artery without angina pectoris: Secondary | ICD-10-CM | POA: Diagnosis present

## 2021-03-10 DIAGNOSIS — C7B8 Other secondary neuroendocrine tumors: Secondary | ICD-10-CM | POA: Insufficient documentation

## 2021-03-10 DIAGNOSIS — G4733 Obstructive sleep apnea (adult) (pediatric): Secondary | ICD-10-CM | POA: Diagnosis present

## 2021-03-12 ENCOUNTER — Inpatient Hospital Stay: Payer: Medicare Other | Admitting: Nutrition

## 2021-03-12 ENCOUNTER — Other Ambulatory Visit: Payer: Self-pay

## 2021-03-12 ENCOUNTER — Inpatient Hospital Stay: Payer: Medicare Other | Admitting: Hematology

## 2021-03-12 ENCOUNTER — Encounter: Payer: Self-pay | Admitting: Hematology

## 2021-03-12 ENCOUNTER — Inpatient Hospital Stay: Payer: Medicare Other

## 2021-03-12 VITALS — BP 148/90 | HR 71 | Temp 97.7°F | Resp 18 | Ht 64.0 in | Wt 181.8 lb

## 2021-03-12 DIAGNOSIS — C7A8 Other malignant neuroendocrine tumors: Secondary | ICD-10-CM | POA: Diagnosis not present

## 2021-03-12 DIAGNOSIS — C7A019 Malignant carcinoid tumor of the small intestine, unspecified portion: Secondary | ICD-10-CM

## 2021-03-12 LAB — CMP (CANCER CENTER ONLY)
ALT: 16 U/L (ref 0–44)
AST: 20 U/L (ref 15–41)
Albumin: 3.9 g/dL (ref 3.5–5.0)
Alkaline Phosphatase: 62 U/L (ref 38–126)
Anion gap: 8 (ref 5–15)
BUN: 10 mg/dL (ref 8–23)
CO2: 29 mmol/L (ref 22–32)
Calcium: 9.5 mg/dL (ref 8.9–10.3)
Chloride: 105 mmol/L (ref 98–111)
Creatinine: 0.83 mg/dL (ref 0.44–1.00)
GFR, Estimated: >60 mL/min (ref >60)
Glucose, Bld: 85 mg/dL (ref 70–99)
Potassium: 3.9 mmol/L (ref 3.5–5.1)
Sodium: 142 mmol/L (ref 135–145)
Total Bilirubin: 0.4 mg/dL (ref 0.3–1.2)
Total Protein: 7.4 g/dL (ref 6.5–8.1)

## 2021-03-12 LAB — IRON AND TIBC
Iron: 108 ug/dL (ref 41–142)
Saturation Ratios: 35 % (ref 21–57)
TIBC: 311 ug/dL (ref 236–444)
UIBC: 204 ug/dL (ref 120–384)

## 2021-03-12 LAB — FERRITIN: Ferritin: 24 ng/mL (ref 11–307)

## 2021-03-12 LAB — CBC WITH DIFFERENTIAL (CANCER CENTER ONLY)
Abs Immature Granulocytes: 0.01 10*3/uL (ref 0.00–0.07)
Basophils Absolute: 0 10*3/uL (ref 0.0–0.1)
Basophils Relative: 1 %
Eosinophils Absolute: 0.1 10*3/uL (ref 0.0–0.5)
Eosinophils Relative: 3 %
HCT: 41.9 % (ref 36.0–46.0)
Hemoglobin: 13.8 g/dL (ref 12.0–15.0)
Immature Granulocytes: 0 %
Lymphocytes Relative: 44 %
Lymphs Abs: 1.8 10*3/uL (ref 0.7–4.0)
MCH: 27.5 pg (ref 26.0–34.0)
MCHC: 32.9 g/dL (ref 30.0–36.0)
MCV: 83.6 fL (ref 80.0–100.0)
Monocytes Absolute: 0.3 10*3/uL (ref 0.1–1.0)
Monocytes Relative: 7 %
Neutro Abs: 1.8 10*3/uL (ref 1.7–7.7)
Neutrophils Relative %: 45 %
Platelet Count: 269 10*3/uL (ref 150–400)
RBC: 5.01 MIL/uL (ref 3.87–5.11)
RDW: 16.1 % — ABNORMAL HIGH (ref 11.5–15.5)
WBC Count: 3.9 10*3/uL — ABNORMAL LOW (ref 4.0–10.5)
nRBC: 0 % (ref 0.0–0.2)

## 2021-03-12 MED ORDER — TRAMADOL HCL 50 MG PO TABS: 50.0000 mg | ORAL_TABLET | Freq: Two times a day (BID) | ORAL | 0 refills | Status: DC | PRN

## 2021-03-12 MED ORDER — OCTREOTIDE ACETATE 20 MG IM KIT
20.0000 mg | PACK | Freq: Once | INTRAMUSCULAR | Status: AC
  Administered 2021-03-12: 20 mg via INTRAMUSCULAR

## 2021-03-12 MED ORDER — OCTREOTIDE ACETATE 20 MG IM KIT
PACK | INTRAMUSCULAR | Status: AC
  Filled 2021-03-12: qty 1

## 2021-03-12 NOTE — Patient Instructions (Signed)
Octreotide injection solution What is this medication? OCTREOTIDE (ok TREE oh tide) is used to reduce blood levels of growth hormone in patients with a condition called acromegaly. This medicine also reduces flushing and watery diarrhea caused by certain types of cancer. This medicine may be used for other purposes; ask your health care provider or pharmacist if you have questions. COMMON BRAND NAME(S): Bynfezia, Sandostatin What should I tell my care team before I take this medication? They need to know if you have any of these conditions: diabetes gallbladder disease kidney disease liver disease thyroid disease an unusual or allergic reaction to octreotide, other medicines, foods, dyes, or preservatives pregnant or trying to get pregnant breast-feeding How should I use this medication? This medicine is for injection under the skin or into a vein (only in emergency situations). It is usually given by a health care professional in a hospital or clinic setting. If you get this medicine at home, you will be taught how to prepare and give this medicine. Allow the injection solution to come to room temperature before use. Do not warm it artificially. Use exactly as directed. Take your medicine at regular intervals. Do not take your medicine more often than directed. It is important that you put your used needles and syringes in a special sharps container. Do not put them in a trash can. If you do not have a sharps container, call your pharmacist or healthcare provider to get one. Talk to your pediatrician regarding the use of this medicine in children. Special care may be needed. Overdosage: If you think you have taken too much of this medicine contact a poison control center or emergency room at once. NOTE: This medicine is only for you. Do not share this medicine with others. What if I miss a dose? If you miss a dose, take it as soon as you can. If it is almost time for your next dose, take only  that dose. Do not take double or extra doses. What may interact with this medication? bromocriptine certain medicines for blood pressure, heart disease, irregular heartbeat cyclosporine diuretics medicines for diabetes, including insulin quinidine This list may not describe all possible interactions. Give your health care provider a list of all the medicines, herbs, non-prescription drugs, or dietary supplements you use. Also tell them if you smoke, drink alcohol, or use illegal drugs. Some items may interact with your medicine. What should I watch for while using this medication? Visit your doctor or health care professional for regular checks on your progress. To help reduce irritation at the injection site, use a different site for each injection and make sure the solution is at room temperature before use. This medicine may cause decreases in blood sugar. Signs of low blood sugar include chills, cool, pale skin or cold sweats, drowsiness, extreme hunger, fast heartbeat, headache, nausea, nervousness or anxiety, shakiness, trembling, unsteadiness, tiredness, or weakness. Contact your doctor or health care professional right away if you experience any of these symptoms. This medicine may increase blood sugar. Ask your healthcare provider if changes in diet or medicines are needed if you have diabetes. This medicine may cause a decrease in vitamin B12. You should make sure that you get enough vitamin B12 while you are taking this medicine. Discuss the foods you eat and the vitamins you take with your health care professional. What side effects may I notice from receiving this medication? Side effects that you should report to your doctor or health care professional as soon as   possible: allergic reactions like skin rash, itching or hives, swelling of the face, lips, or tongue fast, slow, or irregular heartbeat right upper belly pain severe stomach pain signs and symptoms of high blood sugar such  as being more thirsty or hungry or having to urinate more than normal. You may also feel very tired or have blurry vision. signs and symptoms of low blood sugar such as feeling anxious; confusion; dizziness; increased hunger; unusually weak or tired; increased sweating; shakiness; cold, clammy skin; irritable; headache; blurred vision; fast heartbeat; loss of consciousness unusually weak or tired Side effects that usually do not require medical attention (report to your doctor or health care professional if they continue or are bothersome): diarrhea dizziness gas headache nausea, vomiting pain, redness, or irritation at site where injected upset stomach This list may not describe all possible side effects. Call your doctor for medical advice about side effects. You may report side effects to FDA at 1-800-FDA-1088. Where should I keep my medication? Keep out of the reach of children. Store in a refrigerator between 2 and 8 degrees C (36 and 46 degrees F). Protect from light. Allow to come to room temperature naturally. Do not use artificial heat. If protected from light, the injection may be stored at room temperature between 20 and 30 degrees C (70 and 86 degrees F) for 14 days. After the initial use, throw away any unused portion of a multiple dose vial after 14 days. Throw away unused portions of the ampules after use. NOTE: This sheet is a summary. It may not cover all possible information. If you have questions about this medicine, talk to your doctor, pharmacist, or health care provider.  2022 Elsevier/Gold Standard (2019-03-29 13:33:09)  

## 2021-03-12 NOTE — Progress Notes (Addendum)
Tanya Walsh   Telephone:([phone] Fax:(336) [phone]   Clinic Follow up Note   Patient Care Team: Katherina Mires, MD as PCP - General (Family Medicine) Sueanne Margarita, MD as PCP - Sleep Medicine (Cardiology) Belva Crome, MD as PCP - Cardiology (Cardiology) Pieter Partridge, DO as Consulting Physician (Neurology) Alla Feeling, NP as Nurse Practitioner (Nurse Practitioner) Truitt Merle, MD as Consulting Physician (Oncology) Jonnie Finner, RN as Oncology Nurse Navigator  Date of Service:  03/12/2021  CHIEF COMPLAINT: f/u of metastatic neuroendocrine tumor  SUMMARY OF ONCOLOGIC HISTORY: Oncology History  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 Ronnette Juniper Impression - Normal upper third of esophagus, middle third of esophagus and lower third of esophagus. - Z-line regular, 38 cm from the incisors. - Erythematous mucosa in the antrum. Biopsied. Clip (MR conditional) was placed. - Normal examined duodenum. Biopsied.   12/10/2020 Pathology Results   FINAL MICROSCOPIC DIAGNOSIS:   A. DUODENUM, BIOPSY:  - Duodenal mucosa with no significant pathologic findings.  - Negative for increased intraepithelial lymphocytes and villous  architectural changes.   B. STOMACH, ANTRUM, BIOPSY:  - Reactive gastropathy.  - Warthin-Starry stain is negative for Helicobacter pylori.    12/11/2020 Procedure   Colonoscopy, Dr. Ronnette Juniper impression - One 5 mm polyp in the transverse colon, removed piecemeal using a cold biopsy forceps. Resected and retrieved. - Diverticulosis in the sigmoid colon, in the descending colon and in the transverse colon. - The examined portion of the ileum was normal. - Non-bleeding internal hemorrhoids.   12/11/2020 Pathology Results   FINAL MICROSCOPIC DIAGNOSIS:   A. COLON, TRANSVERSE, POLYPECTOMY:  - Tubular adenoma.  - Negative for high grade dysplasia.    12/24/2020  Procedure   Capsule endoscopy report: A polypoid nodular growth was noted at 3 hours and 30 minutes and an ulcerated, polypoid growth was noted at 3 hours and 54 minutes which is likely the cause of obscure GI blood loss   01/23/2021 Imaging   CT entero AP w contrast IMPRESSION: 1. No acute findings identified within the abdomen or pelvis. 2. There are several focal areas of intraluminal hyperenhancement within the small bowel loops. The largest is in the nondilated mid to distal jejunum measuring 1.3 cm. Cannot rule out underlying small bowel neoplasm. Correlation with tissue sampling results advised. Correlation with results from capsule endoscopy. 3. There is a enhancing soft tissue nodule with central calcification within the central small bowel mesentery. This is nonspecific and may represent sequelae of inflammation/infection. Primary differential considerations include carcinoid tumor or metastatic adenopathy. If there is a clinical concern for carcinoid tumor consider further investigation with DOTATATE PET. 4. Left adrenal gland adenoma. 5. Distal colonic diverticulosis without signs of acute diverticulitis. 6. Aortic atherosclerosis.   02/16/2021 PET scan   IMPRESSION: 1. Evidence of well differentiated small bowel neuroendocrine tumor with mesenteric metastasis. 2. Approximately 7 discrete small bowel lesions with intense radiotracer activity. Lesion depicted on comparison CT enterography. 3. Two intensely radiotracer avid mesenteric implants centrally within the small bowel mesentery. 4. Single hepatic metastasis with a second potential hepatic metastasis versus peritoneal implant. 5. No small bowel obstruction. 6. No evidence of metastatic disease outside the abdomen pelvis. 7. Patient may be a candidate for peptide receptor radiotherapy (Lu-177 DOTATATE) available at [facility] [id number]).   02/26/2021 Initial Diagnosis   Primary  malignant neuroendocrine tumor of small intestine ([place])  02/26/2021 Cancer Staging   Staging form: Gastrointestinal Stromal Tumor - Small Intestinal, Esophageal, Colorectal, Mesenteric, and Peritoneal GIST, AJCC 8th Edition - Clinical: Stage IV (cTX, cN0, pM1) - Signed by Alla Feeling, NP on 02/26/2021       CURRENT THERAPY:  Sandostatin, starting 03/12/21  INTERVAL HISTORY:  Tanya Walsh is here for a follow up of metastatic neuroendocrine tumor. She was last seen by NP [name] in consultation on 02/25/21. She presents to the clinic accompanied by family member [name]. She reports intermittent abdominal cramps that last about a minute at a time. She reports her bowel movements are "up and down." She denies diarrhea but notes some loose stool. She notes she has bowel movements up to twice a day. She notes she is eating well, however she was noted to have a weight loss of 3 lbs since her last visit. She reports chronic back pain that has been worsening recently. At visit today, she reports the pain is 5/10. She notes this can get up to 10/10. She notes she was previously given tramadol in the hospital, and this worked for her.  All other systems were reviewed with the patient and are negative.  MEDICAL HISTORY:  Past Medical History:  Diagnosis Date   Coronary artery disease    Depression    GI bleed 07/17/2015   Hypertension    Myocardial infarction [facility])    Nocturia 08/14/2019   Refusal of blood transfusions as patient is Jehovah's Witness     SURGICAL HISTORY: Past Surgical History:  Procedure Laterality Date   ABDOMINAL HYSTERECTOMY     BACK SURGERY     BIOPSY  12/10/2020   Procedure: BIOPSY;  Surgeon: Ronnette Juniper, MD;  Location: [facility] ENDOSCOPY;  Service: Gastroenterology;;   BIOPSY  12/11/2020   Procedure: BIOPSY;  Surgeon: Ronnette Juniper, MD;  Location: [facility] ENDOSCOPY;  Service: Gastroenterology;;   COLONOSCOPY WITH PROPOFOL N/A 12/11/2020   Procedure: COLONOSCOPY WITH  PROPOFOL;  Surgeon: Ronnette Juniper, MD;  Location: [place];  Service: Gastroenterology;  Laterality: N/A;   ESOPHAGOGASTRODUODENOSCOPY N/A 07/17/2015   Procedure: ESOPHAGOGASTRODUODENOSCOPY (EGD);  Surgeon: Wonda Horner, MD;  Location: [facility] ENDOSCOPY;  Service: Endoscopy;  Laterality: N/A;   ESOPHAGOGASTRODUODENOSCOPY (EGD) WITH PROPOFOL N/A 12/10/2020   Procedure: ESOPHAGOGASTRODUODENOSCOPY (EGD) WITH PROPOFOL;  Surgeon: Ronnette Juniper, MD;  Location: [place];  Service: Gastroenterology;  Laterality: N/A;   HEMOSTASIS CLIP PLACEMENT  12/10/2020   Procedure: HEMOSTASIS CLIP PLACEMENT;  Surgeon: Ronnette Juniper, MD;  Location: [place];  Service: Gastroenterology;;    I have reviewed the social history and family history with the patient and they are unchanged from previous note.  ALLERGIES:  has No Known Allergies.  MEDICATIONS:  Current Outpatient Medications  Medication Sig Dispense Refill   traMADol (ULTRAM) 50 MG tablet Take 1 tablet (50 mg total) by mouth every 12 (twelve) hours as needed. 30 tablet 0   cyclobenzaprine (FLEXERIL) 5 MG tablet Take 1 tablet (5 mg total) by mouth 3 (three) times daily as needed for muscle spasms. 30 tablet 0   DULoxetine (CYMBALTA) 30 MG capsule Take 30 mg by mouth daily.     losartan-hydrochlorothiazide (HYZAAR) 50-12.5 MG tablet Take 1 tablet by mouth daily.     Multiple Vitamins-Minerals (MULTIVITAMIN ADULTS 50+) TABS Take 1 tablet by mouth daily.     nitroGLYCERIN (NITROSTAT) 0.4 MG SL tablet Place 1 tablet (0.4 mg total) under the tongue every 5 (five) minutes as needed for chest pain. 30 tablet 0  pantoprazole (PROTONIX) 40 MG tablet Take 40 mg by mouth daily.     rosuvastatin (CRESTOR) 20 MG tablet Take 20 mg by mouth daily.     trolamine salicylate (ASPERCREME) 10 % cream Apply 1 application topically as needed for muscle pain.     No current facility-administered medications for this visit.    PHYSICAL EXAMINATION: ECOG PERFORMANCE STATUS: 1  - Symptomatic but completely ambulatory  Vitals:   03/12/21 1039  BP: (!) 148/90  Pulse: 71  Resp: 18  Temp: 97.7 F (36.5 C)  SpO2: 100%   Filed Weights   03/12/21 1039  Weight: 181 lb 12.8 oz (82.5 kg)    Due to COVID19 we will limit examination to appearance. Patient had no complaints.  GENERAL:alert, no distress and comfortable SKIN: skin color normal, no rashes or significant lesions EYES: normal, Conjunctiva are pink and non-injected, sclera clear  NEURO: alert & oriented x 3 with fluent speech  LABORATORY DATA:  I have reviewed the data as listed CBC Latest Ref Rng & Units 03/12/2021 01/08/2021 12/11/2020  WBC 4.0 - 10.5 K/uL 3.9(L) 6.3 4.7  Hemoglobin 12.0 - 15.0 g/dL 13.8 12.4 8.1(L)  Hematocrit 36.0 - 46.0 % 41.9 41.7 27.1(L)  Platelets 150 - 400 K/uL 269 281 309     CMP Latest Ref Rng & Units 03/12/2021 01/08/2021 12/11/2020  Glucose 70 - 99 mg/dL 85 128(H) 92  BUN 8 - 23 mg/dL 10 16 <5(L)  Creatinine 0.44 - 1.00 mg/dL 0.83 1.04(H) 0.74  Sodium 135 - 145 mmol/L 142 140 140  Potassium 3.5 - 5.1 mmol/L 3.9 3.4(L) 3.6  Chloride 98 - 111 mmol/L 105 103 108  CO2 22 - 32 mmol/L 29 26 26   Calcium 8.9 - 10.3 mg/dL 9.5 9.6 8.8(L)  Total Protein 6.5 - 8.1 g/dL 7.4 - -  Total Bilirubin 0.3 - 1.2 mg/dL 0.4 - -  Alkaline Phos 38 - 126 U/L 62 - -  AST 15 - 41 U/L 20 - -  ALT 0 - 44 U/L 16 - -      RADIOGRAPHIC STUDIES: I have personally reviewed the radiological images as listed and agreed with the findings in the report. No results found.   ASSESSMENT & PLAN:  Billee P Cosey is a 65 y.o. female with   1.Metastatic neuroendocrine tumor of small bowel, to mesentery and liver, stage IV -We reviewed her medical record including labs, imaging, and diagnostic work-up in great detail -she presented with symptomatic anemia from GI bleeding, abdominal pain, and flushing in 11/2020, work up showed 2 gastric polyps one of which was ulcerated. -Given the sensitivity of  dotatate PET scan, findings are diagnostic for metastatic neuroendocrine tumor of the small bowel to the mesentery and liver.  We do not feel a biopsy is necessary for confirmation but will be needed for Lutathera treatment down the line -Her case was discussed in GI tumor board, she is not a surgical candidate due to metastatic disease.  IR Dr. Mare Ferrari has agreed to do CT guided liver biopsy -We discussed the nature of neuroendocrine tumors.  Although not curable this is still treatable, the goal is palliative, to improve her symptoms (abdominal pain and flushing), disease control, and to prolong her life. -Dr. Burr Medico recommends first-line Sandostatin injections -Liver biopsy was attempted on 03/06/21, but the hepatic lesion could not be localized with ultrasound.  -She is scheduled to begin Sandostatin today. I reviewed potential benefit and side effects including but not limited to fatigue, hypertension, GI  discomfort, hematologic changes, metabolic abnormalities, and local pain at injection site were reviewed. -She is scheduled for enteroscopy tomorrow, 03/13/21, under Dr. Durene Fruits at [facility]. -I discussed restaging scans every 3-4 months to monitor the tumor. -Labs reviewed, adequate to proceed with C1 Sandostatin today  2. Abdominal pain, flushing -Secondary to #1, managed with tylenol -Given the multiple small bowel lesions and mesenteric metastasis, she is at high risk for bowel obstruction.  -We recommend low residue diet.  She will see our dietician today.   3. Back pain -She reports chronic back pain that has been worsening recently, up to 10/10 pain. -She requested pain medication today. I prescribed tramadol for her. I reviewed that she should take this only as needed. -She is currently on 30 mg of Cymbalta for anxiety/depression. I recommended she discuss increasing the dose with her PCP. I discussed the interaction between cymbalta and tramadol and advised her not to take them at the same  time.   4. CAD, HTN, pre-- DM -On losartan-HCTZ and rosuvastatin -Follow-up with PCP and cardiology    PLAN: -prescribed tramadol today -proceed with Sandostatin today with low dose 20mg  and continue every 4 weeks, will increase to 30mg  from next dose if she tolerates well today  -proceed with scheduled enteroscopy at [facility] tomorrow, 03/13/21 -f/u in 8 weeks with lab     No problem-specific Assessment & Plan notes found for this encounter.   No orders of the defined types were placed in this encounter.  All questions were answered. The patient knows to call the clinic with any problems, questions or concerns. No barriers to learning was detected. The total time spent in the appointment was 30 minutes.     Truitt Merle, MD 03/12/2021   I, Wilburn Mylar, am acting as scribe for Truitt Merle, MD.   I have reviewed the above documentation for accuracy and completeness, and I agree with the above.

## 2021-03-12 NOTE — Progress Notes (Signed)
Consult received for low residue diet education secondary to risk for obstruction.  Patient is a 65 year old female diagnosed with metastatic neuroendocrine tumor.  Past medical history includes hypertension, prediabetes, GERD, diverticulosis, CAD, depression, MI.  Medications include MVI, Protonix.  Labs include potassium 3.4, glucose 128, creatinine 1.04.  Height: 64 inches. Weight: 181.8 pounds BMI: 31.21.  Nutrition diagnosis: Food and nutrition related knowledge deficit related to metastatic neuroendocrine tumor as evidenced by no prior need for nutrition related information.  Intervention: Educated patient on low fiber diet to prevent bowel obstruction.  Reviewed foods to include and foods to avoid.  Educated patient on label reading.  Enforced importance of not consuming more than 10 g of fiber a day.  Patient was provided with nutrition fact sheet including sample menu.  Questions were answered.  Teach back method used.  Contact information given.  Monitoring, evaluation, goals: Patient will tolerate adequate calories and protein for weight maintenance while following low fiber diet to avoid obstruction.  Nutrition diagnosis has resolved.  No follow-up needed at this time.

## 2021-03-13 LAB — CHROMOGRANIN A: Chromogranin A (ng/mL): 78.2 ng/mL (ref 0.0–101.8)

## 2021-04-02 ENCOUNTER — Other Ambulatory Visit: Payer: Self-pay | Admitting: *Deleted

## 2021-04-02 DIAGNOSIS — C7B8 Other secondary neuroendocrine tumors: Secondary | ICD-10-CM | POA: Insufficient documentation

## 2021-04-02 DIAGNOSIS — C7A8 Other malignant neuroendocrine tumors: Secondary | ICD-10-CM | POA: Insufficient documentation

## 2021-04-02 DIAGNOSIS — C7A019 Malignant carcinoid tumor of the small intestine, unspecified portion: Secondary | ICD-10-CM

## 2021-04-08 ENCOUNTER — Telehealth: Payer: Self-pay | Admitting: Hematology

## 2021-04-08 ENCOUNTER — Other Ambulatory Visit: Payer: Self-pay | Admitting: Hematology

## 2021-04-08 LAB — 5 HIAA, QUANTITATIVE, URINE, 24 HOUR
5-HIAA, Ur: 5.5 mg/L
5-HIAA,Quant.,24 Hr Urine: 5.5 mg/24 hr (ref 0.0–14.9)
Total Volume: 1000

## 2021-04-08 NOTE — Telephone Encounter (Signed)
I got a call from [facility] gastroenterologist Dr. Malissa Hippo regarding her small bowel endoscopy tomorrow.  Since this is not for diagnosis, tissue diagnosis is more for her future Lutathera treatment in future, he does not feel the procedure is needed for now.  I agree with him, and will cancel her endoscopy tomorrow at [facility]. I have called pt and explained to her, she voiced good understanding and agrees with the plan.  She is coming in tomorrow for second Sandostatin injection.  She tolerated first injection well with a few days of diarrhea.  Truitt Merle  04/08/2021

## 2021-04-09 ENCOUNTER — Other Ambulatory Visit: Payer: Self-pay

## 2021-04-09 ENCOUNTER — Inpatient Hospital Stay: Payer: Medicare Other | Attending: Hematology

## 2021-04-09 VITALS — BP 154/79 | HR 73 | Temp 99.0°F | Resp 18

## 2021-04-09 DIAGNOSIS — C7A8 Other malignant neuroendocrine tumors: Secondary | ICD-10-CM

## 2021-04-09 DIAGNOSIS — C7B8 Other secondary neuroendocrine tumors: Secondary | ICD-10-CM | POA: Insufficient documentation

## 2021-04-09 MED ORDER — OCTREOTIDE ACETATE 30 MG IM KIT
30.0000 mg | PACK | Freq: Once | INTRAMUSCULAR | Status: AC
  Administered 2021-04-09: 30 mg via INTRAMUSCULAR

## 2021-04-09 MED ORDER — OCTREOTIDE ACETATE 30 MG IM KIT
PACK | INTRAMUSCULAR | Status: AC
  Filled 2021-04-09: qty 1

## 2021-04-09 NOTE — Patient Instructions (Signed)
Octreotide injection solution What is this medication? OCTREOTIDE (ok TREE oh tide) is used to reduce blood levels of growth hormone in patients with a condition called acromegaly. This medicine also reduces flushing and watery diarrhea caused by certain types of cancer. This medicine may be used for other purposes; ask your health care provider or pharmacist if you have questions. COMMON BRAND NAME(S): Bynfezia, Sandostatin What should I tell my care team before I take this medication? They need to know if you have any of these conditions: diabetes gallbladder disease kidney disease liver disease thyroid disease an unusual or allergic reaction to octreotide, other medicines, foods, dyes, or preservatives pregnant or trying to get pregnant breast-feeding How should I use this medication? This medicine is for injection under the skin or into a vein (only in emergency situations). It is usually given by a health care professional in a hospital or clinic setting. If you get this medicine at home, you will be taught how to prepare and give this medicine. Allow the injection solution to come to room temperature before use. Do not warm it artificially. Use exactly as directed. Take your medicine at regular intervals. Do not take your medicine more often than directed. It is important that you put your used needles and syringes in a special sharps container. Do not put them in a trash can. If you do not have a sharps container, call your pharmacist or healthcare provider to get one. Talk to your pediatrician regarding the use of this medicine in children. Special care may be needed. Overdosage: If you think you have taken too much of this medicine contact a poison control center or emergency room at once. NOTE: This medicine is only for you. Do not share this medicine with others. What if I miss a dose? If you miss a dose, take it as soon as you can. If it is almost time for your next dose, take only  that dose. Do not take double or extra doses. What may interact with this medication? bromocriptine certain medicines for blood pressure, heart disease, irregular heartbeat cyclosporine diuretics medicines for diabetes, including insulin quinidine This list may not describe all possible interactions. Give your health care provider a list of all the medicines, herbs, non-prescription drugs, or dietary supplements you use. Also tell them if you smoke, drink alcohol, or use illegal drugs. Some items may interact with your medicine. What should I watch for while using this medication? Visit your doctor or health care professional for regular checks on your progress. To help reduce irritation at the injection site, use a different site for each injection and make sure the solution is at room temperature before use. This medicine may cause decreases in blood sugar. Signs of low blood sugar include chills, cool, pale skin or cold sweats, drowsiness, extreme hunger, fast heartbeat, headache, nausea, nervousness or anxiety, shakiness, trembling, unsteadiness, tiredness, or weakness. Contact your doctor or health care professional right away if you experience any of these symptoms. This medicine may increase blood sugar. Ask your healthcare provider if changes in diet or medicines are needed if you have diabetes. This medicine may cause a decrease in vitamin B12. You should make sure that you get enough vitamin B12 while you are taking this medicine. Discuss the foods you eat and the vitamins you take with your health care professional. What side effects may I notice from receiving this medication? Side effects that you should report to your doctor or health care professional as soon as   possible: allergic reactions like skin rash, itching or hives, swelling of the face, lips, or tongue fast, slow, or irregular heartbeat right upper belly pain severe stomach pain signs and symptoms of high blood sugar such  as being more thirsty or hungry or having to urinate more than normal. You may also feel very tired or have blurry vision. signs and symptoms of low blood sugar such as feeling anxious; confusion; dizziness; increased hunger; unusually weak or tired; increased sweating; shakiness; cold, clammy skin; irritable; headache; blurred vision; fast heartbeat; loss of consciousness unusually weak or tired Side effects that usually do not require medical attention (report to your doctor or health care professional if they continue or are bothersome): diarrhea dizziness gas headache nausea, vomiting pain, redness, or irritation at site where injected upset stomach This list may not describe all possible side effects. Call your doctor for medical advice about side effects. You may report side effects to FDA at 1-800-FDA-1088. Where should I keep my medication? Keep out of the reach of children. Store in a refrigerator between 2 and 8 degrees C (36 and 46 degrees F). Protect from light. Allow to come to room temperature naturally. Do not use artificial heat. If protected from light, the injection may be stored at room temperature between 20 and 30 degrees C (70 and 86 degrees F) for 14 days. After the initial use, throw away any unused portion of a multiple dose vial after 14 days. Throw away unused portions of the ampules after use. NOTE: This sheet is a summary. It may not cover all possible information. If you have questions about this medicine, talk to your doctor, pharmacist, or health care provider.  2022 Elsevier/Gold Standard (2019-03-29 13:33:09)  

## 2021-05-07 ENCOUNTER — Encounter: Payer: Self-pay | Admitting: Hematology

## 2021-05-07 ENCOUNTER — Inpatient Hospital Stay: Payer: Medicare Other

## 2021-05-07 ENCOUNTER — Other Ambulatory Visit: Payer: Self-pay

## 2021-05-07 ENCOUNTER — Inpatient Hospital Stay: Payer: Medicare Other | Attending: Hematology | Admitting: Hematology

## 2021-05-07 VITALS — BP 160/84 | HR 64 | Temp 98.4°F | Resp 18 | Ht 64.0 in | Wt 187.2 lb

## 2021-05-07 DIAGNOSIS — C7A8 Other malignant neuroendocrine tumors: Secondary | ICD-10-CM | POA: Insufficient documentation

## 2021-05-07 DIAGNOSIS — C7A019 Malignant carcinoid tumor of the small intestine, unspecified portion: Secondary | ICD-10-CM

## 2021-05-07 DIAGNOSIS — C7B8 Other secondary neuroendocrine tumors: Secondary | ICD-10-CM | POA: Insufficient documentation

## 2021-05-07 LAB — IRON AND TIBC
Iron: 136 ug/dL (ref 41–142)
Saturation Ratios: 47 % (ref 21–57)
TIBC: 289 ug/dL (ref 236–444)
UIBC: 153 ug/dL (ref 120–384)

## 2021-05-07 LAB — CMP (CANCER CENTER ONLY)
ALT: 18 U/L (ref 0–44)
AST: 18 U/L (ref 15–41)
Albumin: 3.9 g/dL (ref 3.5–5.0)
Alkaline Phosphatase: 66 U/L (ref 38–126)
Anion gap: 6 (ref 5–15)
BUN: 12 mg/dL (ref 8–23)
CO2: 30 mmol/L (ref 22–32)
Calcium: 9.4 mg/dL (ref 8.9–10.3)
Chloride: 105 mmol/L (ref 98–111)
Creatinine: 0.98 mg/dL (ref 0.44–1.00)
GFR, Estimated: >60 mL/min (ref >60)
Glucose, Bld: 97 mg/dL (ref 70–99)
Potassium: 3.7 mmol/L (ref 3.5–5.1)
Sodium: 141 mmol/L (ref 135–145)
Total Bilirubin: 0.5 mg/dL (ref 0.3–1.2)
Total Protein: 7.3 g/dL (ref 6.5–8.1)

## 2021-05-07 LAB — CBC WITH DIFFERENTIAL (CANCER CENTER ONLY)
Abs Immature Granulocytes: 0.02 10*3/uL (ref 0.00–0.07)
Basophils Absolute: 0 10*3/uL (ref 0.0–0.1)
Basophils Relative: 0 %
Eosinophils Absolute: 0.1 10*3/uL (ref 0.0–0.5)
Eosinophils Relative: 3 %
HCT: 40 % (ref 36.0–46.0)
Hemoglobin: 13.2 g/dL (ref 12.0–15.0)
Immature Granulocytes: 0 %
Lymphocytes Relative: 41 %
Lymphs Abs: 2 10*3/uL (ref 0.7–4.0)
MCH: 28.7 pg (ref 26.0–34.0)
MCHC: 33 g/dL (ref 30.0–36.0)
MCV: 87 fL (ref 80.0–100.0)
Monocytes Absolute: 0.4 10*3/uL (ref 0.1–1.0)
Monocytes Relative: 9 %
Neutro Abs: 2.3 10*3/uL (ref 1.7–7.7)
Neutrophils Relative %: 47 %
Platelet Count: 303 10*3/uL (ref 150–400)
RBC: 4.6 MIL/uL (ref 3.87–5.11)
RDW: 14.1 % (ref 11.5–15.5)
WBC Count: 5 10*3/uL (ref 4.0–10.5)
nRBC: 0 % (ref 0.0–0.2)

## 2021-05-07 LAB — FERRITIN: Ferritin: 41 ng/mL (ref 11–307)

## 2021-05-07 MED ORDER — OCTREOTIDE ACETATE 30 MG IM KIT
30.0000 mg | PACK | Freq: Once | INTRAMUSCULAR | Status: AC
  Administered 2021-05-07: 30 mg via INTRAMUSCULAR
  Filled 2021-05-07: qty 1

## 2021-05-07 NOTE — Progress Notes (Addendum)
[place]   Telephone:(336) [phone] Fax:(336) [phone]   Clinic Follow up Note   Patient Care Team: Katherina Mires, MD as PCP - General (Family Medicine) Sueanne Margarita, MD as PCP - Sleep Medicine (Cardiology) Belva Crome, MD as PCP - Cardiology (Cardiology) Pieter Partridge, DO as Consulting Physician (Neurology) Alla Feeling, NP as Nurse Practitioner (Nurse Practitioner) Truitt Merle, MD as Consulting Physician (Oncology) Jonnie Finner, RN (Inactive) as Oncology Nurse Navigator  Date of Service:  05/07/2021  CHIEF COMPLAINT: f/u of metastatic neuroendocrine tumor  ASSESSMENT & PLAN:  Tanya Walsh is a 65 y.o. female with   1. Metastatic neuroendocrine tumor of small bowel, to mesentery and liver, stage IV -she presented with symptomatic anemia from GI bleeding, abdominal pain, and flushing in 11/2020, dotatate PET scan showed approximately 7 discrete small bowel primaries and the mesentery implants and a single liver metastasis, findings are diagnostic for metastatic neuroendocrine tumor of the small bowel to the mesentery and liver.   -Her case was discussed in GI tumor board, she is not a surgical candidate due to metastatic disease.  -Liver biopsy was attempted on 03/06/21, but the hepatic lesion could not be localized with ultrasound.  -She began Sandostatin on 03/12/21.  -Discussion with Dr. Malissa Hippo on 04/08/21 determined that the risks of small bowel endoscopy and biopsy outweigh the benefit and the procedure was cancelled. -She tolerated first dose '20mg'$  Sandostatin injection well, but did have more abdominal cramps after second dose '30mg'$   -Labs reviewed, adequate to proceed with C3 Sandostatin today -We will watch signs of small bowel obstruction due to the multiple primary tumors in small bowel   2. Abdominal pain, flushing -Secondary to #1, managed with tylenol -Given the multiple small bowel lesions and mesenteric metastasis, she is at high risk for  bowel obstruction.  -We recommend low residue diet.  She has been seen by our dietician.   3. Back pain -She reports chronic back pain that has been worsening recently, up to 10/10 pain. -I prescribed tramadol on 03/02/21. I reviewed that she should take this only as needed. -She is currently on 30 mg of Cymbalta for anxiety/depression. I previously discussed the interaction between cymbalta and tramadol and advised her not to take them at the same time.   4. CAD, HTN, pre-- DM -On losartan-HCTZ and rosuvastatin -Follow-up with PCP and cardiology     PLAN: -proceed with Sandostatin '30mg'$  today and every 4 weeks -f/u in 8 weeks with lab    No problem-specific Assessment & Plan notes found for this encounter.   SUMMARY OF ONCOLOGIC HISTORY: Oncology History  Primary malignant neuroendocrine tumor of small intestine ([place])  12/10/2020 Procedure   Upper endoscopy, Dr. Ronnette Juniper Impression - Normal upper third of esophagus, middle third of esophagus and lower third of esophagus. - Z-line regular, 38 cm from the incisors. - Erythematous mucosa in the antrum. Biopsied. Clip (MR conditional) was placed. - Normal examined duodenum. Biopsied.   12/10/2020 Pathology Results   FINAL MICROSCOPIC DIAGNOSIS:   A. DUODENUM, BIOPSY:  - Duodenal mucosa with no significant pathologic findings.  - Negative for increased intraepithelial lymphocytes and villous  architectural changes.   B. STOMACH, ANTRUM, BIOPSY:  - Reactive gastropathy.  - Warthin-Starry stain is negative for Helicobacter pylori.    12/11/2020 Procedure   Colonoscopy, Dr. Ronnette Juniper impression - One 5 mm polyp in the transverse colon, removed piecemeal using a cold biopsy forceps. Resected and retrieved. -  Diverticulosis in the sigmoid colon, in the descending colon and in the transverse colon. - The examined portion of the ileum was normal. - Non-bleeding internal hemorrhoids.   12/11/2020 Pathology Results   FINAL  MICROSCOPIC DIAGNOSIS:   A. COLON, TRANSVERSE, POLYPECTOMY:  - Tubular adenoma.  - Negative for high grade dysplasia.    12/24/2020 Procedure   Capsule endoscopy report: A polypoid nodular growth was noted at 3 hours and 30 minutes and an ulcerated, polypoid growth was noted at 3 hours and 54 minutes which is likely the cause of obscure GI blood loss   01/23/2021 Imaging   CT entero AP w contrast IMPRESSION: 1. No acute findings identified within the abdomen or pelvis. 2. There are several focal areas of intraluminal hyperenhancement within the small bowel loops. The largest is in the nondilated mid to distal jejunum measuring 1.3 cm. Cannot rule out underlying small bowel neoplasm. Correlation with tissue sampling results advised. Correlation with results from capsule endoscopy. 3. There is a enhancing soft tissue nodule with central calcification within the central small bowel mesentery. This is nonspecific and may represent sequelae of inflammation/infection. Primary differential considerations include carcinoid tumor or metastatic adenopathy. If there is a clinical concern for carcinoid tumor consider further investigation with DOTATATE PET. 4. Left adrenal gland adenoma. 5. Distal colonic diverticulosis without signs of acute diverticulitis. 6. Aortic atherosclerosis.   02/16/2021 PET scan   IMPRESSION: 1. Evidence of well differentiated small bowel neuroendocrine tumor with mesenteric metastasis. 2. Approximately 7 discrete small bowel lesions with intense radiotracer activity. Lesion depicted on comparison CT enterography. 3. Two intensely radiotracer avid mesenteric implants centrally within the small bowel mesentery. 4. Single hepatic metastasis with a second potential hepatic metastasis versus peritoneal implant. 5. No small bowel obstruction. 6. No evidence of metastatic disease outside the abdomen pelvis. 7. Patient may be a candidate for peptide receptor  radiotherapy (Lu-177 DOTATATE) available at [place] [phone]).   02/26/2021 Initial Diagnosis   Primary malignant neuroendocrine tumor of small intestine ([place])   02/26/2021 Cancer Staging   Staging form: Gastrointestinal Stromal Tumor - Small Intestinal, Esophageal, Colorectal, Mesenteric, and Peritoneal GIST, AJCC 8th Edition - Clinical: Stage IV (cTX, cN0, pM1) - Signed by Alla Feeling, NP on 02/26/2021      CURRENT THERAPY:  Sandostatin, starting 03/12/21  INTERVAL HISTORY:  Myton is here for a follow up of metastatic neuroendocrine tumor. She was last seen by me on 03/12/21. She presents to the clinic accompanied by family member Tanya Walsh. She reports continued intermittent abdominal cramps. She notes she has 1-2 bowel movements a day that can be soft. "Sometimes it looks okay and sometimes not."   All other systems were reviewed with the patient and are negative.  MEDICAL HISTORY:  Past Medical History:  Diagnosis Date   Coronary artery disease    Depression    GI bleed 07/17/2015   Hypertension    Myocardial infarction [facility])    Nocturia 08/14/2019   Refusal of blood transfusions as patient is Jehovah's Witness     SURGICAL HISTORY: Past Surgical History:  Procedure Laterality Date   ABDOMINAL HYSTERECTOMY     BACK SURGERY     BIOPSY  12/10/2020   Procedure: BIOPSY;  Surgeon: Ronnette Juniper, MD;  Location: [facility] ENDOSCOPY;  Service: Gastroenterology;;   BIOPSY  12/11/2020   Procedure: BIOPSY;  Surgeon: Ronnette Juniper, MD;  Location: [facility] ENDOSCOPY;  Service: Gastroenterology;;   COLONOSCOPY WITH PROPOFOL N/A 12/11/2020  Procedure: COLONOSCOPY WITH PROPOFOL;  Surgeon: Ronnette Juniper, MD;  Location: [place];  Service: Gastroenterology;  Laterality: N/A;   ESOPHAGOGASTRODUODENOSCOPY N/A 07/17/2015   Procedure: ESOPHAGOGASTRODUODENOSCOPY (EGD);  Surgeon: Wonda Horner, MD;  Location: [facility] ENDOSCOPY;  Service: Endoscopy;  Laterality: N/A;    ESOPHAGOGASTRODUODENOSCOPY (EGD) WITH PROPOFOL N/A 12/10/2020   Procedure: ESOPHAGOGASTRODUODENOSCOPY (EGD) WITH PROPOFOL;  Surgeon: Ronnette Juniper, MD;  Location: [place];  Service: Gastroenterology;  Laterality: N/A;   HEMOSTASIS CLIP PLACEMENT  12/10/2020   Procedure: HEMOSTASIS CLIP PLACEMENT;  Surgeon: Ronnette Juniper, MD;  Location: [place];  Service: Gastroenterology;;    I have reviewed the social history and family history with the patient and they are unchanged from previous note.  ALLERGIES:  has No Known Allergies.  MEDICATIONS:  Current Outpatient Medications  Medication Sig Dispense Refill   cyclobenzaprine (FLEXERIL) 5 MG tablet Take 1 tablet (5 mg total) by mouth 3 (three) times daily as needed for muscle spasms. 30 tablet 0   DULoxetine (CYMBALTA) 30 MG capsule Take 30 mg by mouth daily.     losartan-hydrochlorothiazide (HYZAAR) 50-12.5 MG tablet Take 1 tablet by mouth daily.     Multiple Vitamins-Minerals (MULTIVITAMIN ADULTS 50+) TABS Take 1 tablet by mouth daily.     nitroGLYCERIN (NITROSTAT) 0.4 MG SL tablet Place 1 tablet (0.4 mg total) under the tongue every 5 (five) minutes as needed for chest pain. 30 tablet 0   pantoprazole (PROTONIX) 40 MG tablet Take 40 mg by mouth daily.     rosuvastatin (CRESTOR) 20 MG tablet Take 20 mg by mouth daily.     traMADol (ULTRAM) 50 MG tablet Take 1 tablet (50 mg total) by mouth every 12 (twelve) hours as needed. 30 tablet 0   trolamine salicylate (ASPERCREME) 10 % cream Apply 1 application topically as needed for muscle pain.     No current facility-administered medications for this visit.    PHYSICAL EXAMINATION: ECOG PERFORMANCE STATUS: 1 - Symptomatic but completely ambulatory  Vitals:   05/07/21 1455  BP: (!) 160/84  Pulse: 64  Resp: 18  Temp: 98.4 F (36.9 C)  SpO2: 98%   Wt Readings from Last 3 Encounters:  05/07/21 187 lb 3.2 oz (84.9 kg)  03/12/21 181 lb 12.8 oz (82.5 kg)  03/06/21 180 lb (81.6 kg)      GENERAL:alert, no distress and comfortable SKIN: skin color normal, no rashes or significant lesions EYES: normal, Conjunctiva are pink and non-injected, sclera clear  NEURO: alert & oriented x 3 with fluent speech  LABORATORY DATA:  I have reviewed the data as listed CBC Latest Ref Rng & Units 05/07/2021 03/12/2021 01/08/2021  WBC 4.0 - 10.5 K/uL 5.0 3.9(L) 6.3  Hemoglobin 12.0 - 15.0 g/dL 13.2 13.8 12.4  Hematocrit 36.0 - 46.0 % 40.0 41.9 41.7  Platelets 150 - 400 K/uL 303 269 281     CMP Latest Ref Rng & Units 05/07/2021 03/12/2021 01/08/2021  Glucose 70 - 99 mg/dL 97 85 128(H)  BUN 8 - 23 mg/dL '12 10 16  '$ Creatinine 0.44 - 1.00 mg/dL 0.98 0.83 1.04(H)  Sodium 135 - 145 mmol/L 141 142 140  Potassium 3.5 - 5.1 mmol/L 3.7 3.9 3.4(L)  Chloride 98 - 111 mmol/L 105 105 103  CO2 22 - 32 mmol/L '30 29 26  '$ Calcium 8.9 - 10.3 mg/dL 9.4 9.5 9.6  Total Protein 6.5 - 8.1 g/dL 7.3 7.4 -  Total Bilirubin 0.3 - 1.2 mg/dL 0.5 0.4 -  Alkaline Phos 38 - 126 U/L  66 62 -  AST 15 - 41 U/L 18 20 -  ALT 0 - 44 U/L 18 16 -      RADIOGRAPHIC STUDIES: I have personally reviewed the radiological images as listed and agreed with the findings in the report. No results found.    No orders of the defined types were placed in this encounter.  All questions were answered. The patient knows to call the clinic with any problems, questions or concerns. No barriers to learning was detected. The total time spent in the appointment was 30 minutes.     Truitt Merle, MD 05/07/2021   I, Wilburn Mylar, am acting as scribe for Truitt Merle, MD.   I have reviewed the above documentation for accuracy and completeness, and I agree with the above.

## 2021-05-08 LAB — CHROMOGRANIN A: Chromogranin A (ng/mL): 42 ng/mL (ref 0.0–101.8)

## 2021-05-11 ENCOUNTER — Telehealth: Payer: Self-pay | Admitting: Hematology

## 2021-05-11 NOTE — Telephone Encounter (Signed)
Scheduled follow-up appointments per 8/25 los. Patient is aware. 

## 2021-06-04 ENCOUNTER — Other Ambulatory Visit: Payer: Self-pay

## 2021-06-04 ENCOUNTER — Inpatient Hospital Stay: Payer: Medicare Other | Attending: Hematology

## 2021-06-04 VITALS — BP 151/84 | HR 64 | Temp 98.7°F | Resp 16

## 2021-06-04 DIAGNOSIS — C7B8 Other secondary neuroendocrine tumors: Secondary | ICD-10-CM | POA: Diagnosis present

## 2021-06-04 DIAGNOSIS — C7A8 Other malignant neuroendocrine tumors: Secondary | ICD-10-CM | POA: Insufficient documentation

## 2021-06-04 MED ORDER — OCTREOTIDE ACETATE 30 MG IM KIT
30.0000 mg | PACK | Freq: Once | INTRAMUSCULAR | Status: AC
  Administered 2021-06-04: 30 mg via INTRAMUSCULAR
  Filled 2021-06-04: qty 1

## 2021-07-02 ENCOUNTER — Inpatient Hospital Stay: Payer: Medicare Other

## 2021-07-02 ENCOUNTER — Other Ambulatory Visit: Payer: Self-pay

## 2021-07-02 ENCOUNTER — Inpatient Hospital Stay (HOSPITAL_BASED_OUTPATIENT_CLINIC_OR_DEPARTMENT_OTHER): Payer: Medicare Other | Admitting: Hematology

## 2021-07-02 ENCOUNTER — Encounter: Payer: Self-pay | Admitting: Hematology

## 2021-07-02 ENCOUNTER — Inpatient Hospital Stay: Payer: Medicare Other | Attending: Hematology

## 2021-07-02 VITALS — BP 168/93 | HR 69 | Temp 98.6°F | Resp 20 | Ht 64.0 in | Wt 188.5 lb

## 2021-07-02 DIAGNOSIS — C7B8 Other secondary neuroendocrine tumors: Secondary | ICD-10-CM | POA: Diagnosis present

## 2021-07-02 DIAGNOSIS — C7A8 Other malignant neuroendocrine tumors: Secondary | ICD-10-CM | POA: Insufficient documentation

## 2021-07-02 DIAGNOSIS — C7A019 Malignant carcinoid tumor of the small intestine, unspecified portion: Secondary | ICD-10-CM

## 2021-07-02 LAB — CBC WITH DIFFERENTIAL (CANCER CENTER ONLY)
Abs Immature Granulocytes: 0.05 10*3/uL (ref 0.00–0.07)
Basophils Absolute: 0 10*3/uL (ref 0.0–0.1)
Basophils Relative: 1 %
Eosinophils Absolute: 0.2 10*3/uL (ref 0.0–0.5)
Eosinophils Relative: 4 %
HCT: 39.9 % (ref 36.0–46.0)
Hemoglobin: 13.4 g/dL (ref 12.0–15.0)
Immature Granulocytes: 1 %
Lymphocytes Relative: 39 %
Lymphs Abs: 1.7 10*3/uL (ref 0.7–4.0)
MCH: 28.9 pg (ref 26.0–34.0)
MCHC: 33.6 g/dL (ref 30.0–36.0)
MCV: 86.2 fL (ref 80.0–100.0)
Monocytes Absolute: 0.4 10*3/uL (ref 0.1–1.0)
Monocytes Relative: 9 %
Neutro Abs: 2 10*3/uL (ref 1.7–7.7)
Neutrophils Relative %: 46 %
Platelet Count: 287 10*3/uL (ref 150–400)
RBC: 4.63 MIL/uL (ref 3.87–5.11)
RDW: 13.2 % (ref 11.5–15.5)
WBC Count: 4.4 10*3/uL (ref 4.0–10.5)
nRBC: 0 % (ref 0.0–0.2)

## 2021-07-02 LAB — CMP (CANCER CENTER ONLY)
ALT: 20 U/L (ref 0–44)
AST: 19 U/L (ref 15–41)
Albumin: 4.1 g/dL (ref 3.5–5.0)
Alkaline Phosphatase: 57 U/L (ref 38–126)
Anion gap: 7 (ref 5–15)
BUN: 12 mg/dL (ref 8–23)
CO2: 29 mmol/L (ref 22–32)
Calcium: 9.1 mg/dL (ref 8.9–10.3)
Chloride: 101 mmol/L (ref 98–111)
Creatinine: 0.85 mg/dL (ref 0.44–1.00)
GFR, Estimated: >60 mL/min (ref >60)
Glucose, Bld: 121 mg/dL — ABNORMAL HIGH (ref 70–99)
Potassium: 3.3 mmol/L — ABNORMAL LOW (ref 3.5–5.1)
Sodium: 137 mmol/L (ref 135–145)
Total Bilirubin: 0.3 mg/dL (ref 0.3–1.2)
Total Protein: 7.1 g/dL (ref 6.5–8.1)

## 2021-07-02 MED ORDER — OCTREOTIDE ACETATE 30 MG IM KIT
30.0000 mg | PACK | Freq: Once | INTRAMUSCULAR | Status: AC
  Administered 2021-07-02: 30 mg via INTRAMUSCULAR
  Filled 2021-07-02: qty 1

## 2021-07-02 NOTE — Progress Notes (Signed)
[place]   Telephone:(336) [phone] Fax:(336) [phone]   Clinic Follow up Note   Patient Care Team: Katherina Mires, MD as PCP - General (Family Medicine) Sueanne Margarita, MD as PCP - Sleep Medicine (Cardiology) Belva Crome, MD as PCP - Cardiology (Cardiology) Pieter Partridge, DO as Consulting Physician (Neurology) Alla Feeling, NP as Nurse Practitioner (Nurse Practitioner) Truitt Merle, MD as Consulting Physician (Oncology) Jonnie Finner, RN (Inactive) as Oncology Nurse Navigator  Date of Service:  07/02/2021  CHIEF COMPLAINT: f/u of metastatic neuroendocrine tumor  CURRENT THERAPY:  Sandostatin, starting 03/12/21  ASSESSMENT & PLAN:  Tanya Walsh is a 65 y.o. female with   1. Metastatic neuroendocrine tumor of small bowel, to mesentery and liver, stage IV -she presented with symptomatic anemia from GI bleeding, abdominal pain, and flushing in 11/2020, dotatate PET scan showed approximately 7 discrete small bowel primaries and the mesentery implants and a single liver metastasis, findings are diagnostic for metastatic neuroendocrine tumor of the small bowel to the mesentery and liver.   -Her case was discussed in GI tumor board, she is not a surgical candidate due to metastatic disease.  -Liver biopsy was attempted on 03/06/21, but the hepatic lesion could not be localized with ultrasound.  -She began Sandostatin on 03/12/21.  -Discussion with Dr. Malissa Hippo on 04/08/21 determined that the risks of small bowel endoscopy and biopsy outweigh the benefit and the procedure was cancelled. -She tolerated first dose 20mg  Sandostatin injection well, but did have more abdominal cramps after second dose 30mg   -Labs reviewed, adequate to proceed with C5 Sandostatin today. She requested additional information on the medicine; I printed out some material for her and directed her to go to the manufacturers website. -We will watch signs of small bowel obstruction due to the  multiple primary tumors in small bowel  -she will also complete 24hr urine in 09/2021; she was given the container for this today.   2. Abdominal pain, flushing -Secondary to #1, managed with tylenol -Given the multiple small bowel lesions and mesenteric metastasis, she is at high risk for bowel obstruction.  -We recommend low residue diet.  She has been seen by our dietician.   3. Back pain -She reported worsening chronic back pain, up to 10/10 pain. I prescribed tramadol on 03/02/21. I reviewed that she should take this only as needed. -She is currently on 30 mg of Cymbalta for anxiety/depression. I previously discussed the interaction between cymbalta and tramadol and advised her not to take them at the same time.   4. CAD, HTN, pre-- DM -On losartan-HCTZ and rosuvastatin -Follow-up with PCP and cardiology     PLAN: -proceed with Sandostatin 30mg  today and every 4 weeks -f/u in 8 weeks with lab  -We will plan for restaging CT to be done in 08/2021   No problem-specific Assessment & Plan notes found for this encounter.   SUMMARY OF ONCOLOGIC HISTORY: Oncology History  Primary malignant neuroendocrine tumor of small intestine ([place])  12/10/2020 Procedure   Upper endoscopy, Dr. Ronnette Juniper Impression - Normal upper third of esophagus, middle third of esophagus and lower third of esophagus. - Z-line regular, 38 cm from the incisors. - Erythematous mucosa in the antrum. Biopsied. Clip (MR conditional) was placed. - Normal examined duodenum. Biopsied.   12/10/2020 Pathology Results   FINAL MICROSCOPIC DIAGNOSIS:   A. DUODENUM, BIOPSY:  - Duodenal mucosa with no significant pathologic findings.  - Negative for increased intraepithelial lymphocytes and villous  architectural changes.   B. STOMACH, ANTRUM, BIOPSY:  - Reactive gastropathy.  - Warthin-Starry stain is negative for Helicobacter pylori.    12/11/2020 Procedure   Colonoscopy, Dr. Ronnette Juniper impression - One 5 mm polyp  in the transverse colon, removed piecemeal using a cold biopsy forceps. Resected and retrieved. - Diverticulosis in the sigmoid colon, in the descending colon and in the transverse colon. - The examined portion of the ileum was normal. - Non-bleeding internal hemorrhoids.   12/11/2020 Pathology Results   FINAL MICROSCOPIC DIAGNOSIS:   A. COLON, TRANSVERSE, POLYPECTOMY:  - Tubular adenoma.  - Negative for high grade dysplasia.    12/24/2020 Procedure   Capsule endoscopy report: A polypoid nodular growth was noted at 3 hours and 30 minutes and an ulcerated, polypoid growth was noted at 3 hours and 54 minutes which is likely the cause of obscure GI blood loss   01/23/2021 Imaging   CT entero AP w contrast IMPRESSION: 1. No acute findings identified within the abdomen or pelvis. 2. There are several focal areas of intraluminal hyperenhancement within the small bowel loops. The largest is in the nondilated mid to distal jejunum measuring 1.3 cm. Cannot rule out underlying small bowel neoplasm. Correlation with tissue sampling results advised. Correlation with results from capsule endoscopy. 3. There is a enhancing soft tissue nodule with central calcification within the central small bowel mesentery. This is nonspecific and may represent sequelae of inflammation/infection. Primary differential considerations include carcinoid tumor or metastatic adenopathy. If there is a clinical concern for carcinoid tumor consider further investigation with DOTATATE PET. 4. Left adrenal gland adenoma. 5. Distal colonic diverticulosis without signs of acute diverticulitis. 6. Aortic atherosclerosis.   02/16/2021 PET scan   IMPRESSION: 1. Evidence of well differentiated small bowel neuroendocrine tumor with mesenteric metastasis. 2. Approximately 7 discrete small bowel lesions with intense radiotracer activity. Lesion depicted on comparison CT enterography. 3. Two intensely radiotracer avid mesenteric  implants centrally within the small bowel mesentery. 4. Single hepatic metastasis with a second potential hepatic metastasis versus peritoneal implant. 5. No small bowel obstruction. 6. No evidence of metastatic disease outside the abdomen pelvis. 7. Patient may be a candidate for peptide receptor radiotherapy (Lu-177 DOTATATE) available at [place] [phone]).   02/26/2021 Initial Diagnosis   Primary malignant neuroendocrine tumor of small intestine ([place])   02/26/2021 Cancer Staging   Staging form: Gastrointestinal Stromal Tumor - Small Intestinal, Esophageal, Colorectal, Mesenteric, and Peritoneal GIST, AJCC 8th Edition - Clinical: Stage IV (cTX, cN0, pM1) - Signed by Alla Feeling, NP on 02/26/2021      INTERVAL HISTORY:  [place] is here for a follow up of metastatic neuroendocrine tumor. She was last seen by me on 05/07/21. She presents to the clinic accompanied by a family member. She reports fatigue. She also notes the feeling like abdominal pain is coming on but doesn't.   All other systems were reviewed with the patient and are negative.  MEDICAL HISTORY:  Past Medical History:  Diagnosis Date   Coronary artery disease    Depression    GI bleed 07/17/2015   Hypertension    Myocardial infarction [facility])    Nocturia 08/14/2019   Refusal of blood transfusions as patient is Jehovah's Witness     SURGICAL HISTORY: Past Surgical History:  Procedure Laterality Date   ABDOMINAL HYSTERECTOMY     BACK SURGERY     BIOPSY  12/10/2020   Procedure: BIOPSY;  Surgeon: Ronnette Juniper, MD;  Location: [place] ENDOSCOPY;  Service: Gastroenterology;;   BIOPSY  12/11/2020   Procedure: BIOPSY;  Surgeon: Ronnette Juniper, MD;  Location: [facility] ENDOSCOPY;  Service: Gastroenterology;;   COLONOSCOPY WITH PROPOFOL N/A 12/11/2020   Procedure: COLONOSCOPY WITH PROPOFOL;  Surgeon: Ronnette Juniper, MD;  Location: [place];  Service: Gastroenterology;  Laterality: N/A;    ESOPHAGOGASTRODUODENOSCOPY N/A 07/17/2015   Procedure: ESOPHAGOGASTRODUODENOSCOPY (EGD);  Surgeon: Wonda Horner, MD;  Location[facility] ENDOSCOPY;  Service: Endoscopy;  Laterality: N/A;   ESOPHAGOGASTRODUODENOSCOPY (EGD) WITH PROPOFOL N/A 12/10/2020   Procedure: ESOPHAGOGASTRODUODENOSCOPY (EGD) WITH PROPOFOL;  Surgeon: Ronnette Juniper, MD;  Location: [place];  Service: Gastroenterology;  Laterality: N/A;   HEMOSTASIS CLIP PLACEMENT  12/10/2020   Procedure: HEMOSTASIS CLIP PLACEMENT;  Surgeon: Ronnette Juniper, MD;  Location: [place];  Service: Gastroenterology;;    I have reviewed the social history and family history with the patient and they are unchanged from previous note.  ALLERGIES:  has No Known Allergies.  MEDICATIONS:  Current Outpatient Medications  Medication Sig Dispense Refill   cyclobenzaprine (FLEXERIL) 5 MG tablet Take 1 tablet (5 mg total) by mouth 3 (three) times daily as needed for muscle spasms. 30 tablet 0   DULoxetine (CYMBALTA) 30 MG capsule Take 30 mg by mouth daily.     losartan-hydrochlorothiazide (HYZAAR) 50-12.5 MG tablet Take 1 tablet by mouth daily.     Multiple Vitamins-Minerals (MULTIVITAMIN ADULTS 50+) TABS Take 1 tablet by mouth daily.     nitroGLYCERIN (NITROSTAT) 0.4 MG SL tablet Place 1 tablet (0.4 mg total) under the tongue every 5 (five) minutes as needed for chest pain. 30 tablet 0   pantoprazole (PROTONIX) 40 MG tablet Take 40 mg by mouth daily.     rosuvastatin (CRESTOR) 20 MG tablet Take 20 mg by mouth daily.     traMADol (ULTRAM) 50 MG tablet Take 1 tablet (50 mg total) by mouth every 12 (twelve) hours as needed. 30 tablet 0   trolamine salicylate (ASPERCREME) 10 % cream Apply 1 application topically as needed for muscle pain.     No current facility-administered medications for this visit.    PHYSICAL EXAMINATION: ECOG PERFORMANCE STATUS: 1 - Symptomatic but completely ambulatory  Vitals:   07/02/21 1311  BP: (!) 168/93  Pulse: 69  Resp: 20   Temp: 98.6 F (37 C)  SpO2: 99%   Wt Readings from Last 3 Encounters:  07/02/21 188 lb 8 oz (85.5 kg)  05/07/21 187 lb 3.2 oz (84.9 kg)  03/12/21 181 lb 12.8 oz (82.5 kg)     GENERAL:alert, no distress and comfortable SKIN: skin color normal, no rashes or significant lesions EYES: normal, Conjunctiva are pink and non-injected, sclera clear  NEURO: alert & oriented x 3 with fluent speech  LABORATORY DATA:  I have reviewed the data as listed CBC Latest Ref Rng & Units 07/02/2021 05/07/2021 03/12/2021  WBC 4.0 - 10.5 K/uL 4.4 5.0 3.9(L)  Hemoglobin 12.0 - 15.0 g/dL 13.4 13.2 13.8  Hematocrit 36.0 - 46.0 % 39.9 40.0 41.9  Platelets 150 - 400 K/uL 287 303 269     CMP Latest Ref Rng & Units 07/02/2021 05/07/2021 03/12/2021  Glucose 70 - 99 mg/dL 121(H) 97 85  BUN 8 - 23 mg/dL 12 12 10   Creatinine 0.44 - 1.00 mg/dL 0.85 0.98 0.83  Sodium 135 - 145 mmol/L 137 141 142  Potassium 3.5 - 5.1 mmol/L 3.3(L) 3.7 3.9  Chloride 98 - 111 mmol/L 101 105 105  CO2 22 - 32 mmol/L 29  30 29  Calcium 8.9 - 10.3 mg/dL 9.1 9.4 9.5  Total Protein 6.5 - 8.1 g/dL 7.1 7.3 7.4  Total Bilirubin 0.3 - 1.2 mg/dL 0.3 0.5 0.4  Alkaline Phos 38 - 126 U/L 57 66 62  AST 15 - 41 U/L 19 18 20   ALT 0 - 44 U/L 20 18 16       RADIOGRAPHIC STUDIES: I have personally reviewed the radiological images as listed and agreed with the findings in the report. No results found.    Orders Placed This Encounter  Procedures   CT ABDOMEN PELVIS W CONTRAST    Standing Status:   Future    Standing Expiration Date:   07/02/2022    Order Specific Question:   If indicated for the ordered procedure, I authorize the administration of contrast media per Radiology protocol    Answer:   Yes    Order Specific Question:   Preferred imaging location?    Answer:   [facility]    Order Specific Question:   Is Oral Contrast requested for this exam?    Answer:   Yes, Per Radiology protocol   All questions were answered. The  patient knows to call the clinic with any problems, questions or concerns. No barriers to learning was detected. The total time spent in the appointment was 30 minutes.     Truitt Merle, MD 07/02/2021   I, Wilburn Mylar, am acting as scribe for Truitt Merle, MD.   I have reviewed the above documentation for accuracy and completeness, and I agree with the above.

## 2021-07-03 ENCOUNTER — Telehealth: Payer: Self-pay | Admitting: Hematology

## 2021-07-03 LAB — CHROMOGRANIN A: Chromogranin A (ng/mL): 34.6 ng/mL (ref 0.0–101.8)

## 2021-07-03 LAB — IRON AND TIBC
Iron: 106 ug/dL (ref 28–170)
Saturation Ratios: 36 % — ABNORMAL HIGH (ref 10.4–31.8)
TIBC: 298 ug/dL (ref 250–450)
UIBC: 192 ug/dL

## 2021-07-03 LAB — FERRITIN: Ferritin: 36 ng/mL (ref 11–307)

## 2021-07-03 NOTE — Telephone Encounter (Signed)
Left message with follow-up appointments per 10/20 los. 

## 2021-07-10 ENCOUNTER — Encounter (HOSPITAL_COMMUNITY): Payer: Self-pay

## 2021-07-10 ENCOUNTER — Ambulatory Visit (HOSPITAL_COMMUNITY)
Admission: EM | Admit: 2021-07-10 | Discharge: 2021-07-10 | Disposition: A | Discharge status: Home / Self Care | Payer: Medicare Other | Attending: Emergency Medicine | Admitting: Emergency Medicine

## 2021-07-10 ENCOUNTER — Other Ambulatory Visit: Payer: Self-pay

## 2021-07-10 DIAGNOSIS — L03011 Cellulitis of right finger: Secondary | ICD-10-CM | POA: Diagnosis not present

## 2021-07-10 MED ORDER — DOXYCYCLINE HYCLATE 100 MG PO CAPS: 100.0000 mg | ORAL_CAPSULE | Freq: Two times a day (BID) | ORAL | 0 refills | Status: DC

## 2021-07-10 NOTE — ED Triage Notes (Signed)
Pt is c/o of an injury on her right 5th digit; she cut it sometime last week but does not remember what day it was; she believes that it is infected and it is painful to the touch; pt was diagnosed with colon and liver cancer this past June and is concerned with the infection  Pt would like to have visit be sent over to her oncologiest

## 2021-07-10 NOTE — Discharge Instructions (Signed)
Take the doxycycline 1 pill twice a day for the next 10 days.  Apply a warm compress or epsom salt salt to the area 3-4 times a day. You can take Tylenol and/or ibuprofen as needed for pain relief and fever reduction.  Return for reevaluation for any worsening symptoms including worsening redness, swelling, red streaks, or fevers.

## 2021-07-10 NOTE — ED Provider Notes (Signed)
[place]    CSN: [id number] Arrival date & time: 07/10/21  1537      History   Chief Complaint Chief Complaint  Patient presents with   Finger Injury    Right 5th digit    HPI Jacoya P Walsh is a 65 y.o. female.   Patient here for evaluation of right fifth finger pain and swelling.  Reports that she cut her finger near the fingernail approximately 2 weeks ago.  Reports pain and swelling has not improved and has noticed a purulent discharge over the past several days.  Patient does report recent diagnosis of cancer and is undergoing treatment.  Has not tried any OTC medications or treatments.  Denies any specific alleviating or aggravating factors.  Denies any fevers, chest pain, shortness of breath, N/V/D, numbness, tingling, weakness, abdominal pain, or headaches.    The history is provided by the patient.   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   GIB (gastrointestinal bleeding) 12/09/2020   Nocturia 08/14/2019   Melena 07/17/2015   Incidental lung nodule, less than or equal to 59mm 07/17/2015   Chest pain at rest 04/16/2014   Essential hypertension 04/16/2014   Morbid obesity ([place]) 04/16/2014   Family history of early CAD 04/16/2014    Past Surgical History:  Procedure Laterality Date   ABDOMINAL HYSTERECTOMY     BACK SURGERY     BIOPSY  12/10/2020   Procedure: BIOPSY;  Surgeon: Ronnette Juniper, MD;  Location: [facility] ENDOSCOPY;  Service: Gastroenterology;;   BIOPSY  12/11/2020   Procedure: BIOPSY;  Surgeon: Ronnette Juniper, MD;  Location: [place];  Service: Gastroenterology;;   COLONOSCOPY WITH PROPOFOL N/A 12/11/2020   Procedure:  COLONOSCOPY WITH PROPOFOL;  Surgeon: Ronnette Juniper, MD;  Location: [place];  Service: Gastroenterology;  Laterality: N/A;   ESOPHAGOGASTRODUODENOSCOPY N/A 07/17/2015   Procedure: ESOPHAGOGASTRODUODENOSCOPY (EGD);  Surgeon: Wonda Horner, MD;  Location[facility] ENDOSCOPY;  Service: Endoscopy;  Laterality: N/A;   ESOPHAGOGASTRODUODENOSCOPY (EGD) WITH PROPOFOL N/A 12/10/2020   Procedure: ESOPHAGOGASTRODUODENOSCOPY (EGD) WITH PROPOFOL;  Surgeon: Ronnette Juniper, MD;  Location: [place];  Service: Gastroenterology;  Laterality: N/A;   HEMOSTASIS CLIP PLACEMENT  12/10/2020   Procedure: HEMOSTASIS CLIP PLACEMENT;  Surgeon: Ronnette Juniper, MD;  Location: [facility] ENDOSCOPY;  Service: Gastroenterology;;    OB History   No obstetric history on file.      Home Medications    Prior to Admission medications   Medication Sig Start Date End Date Taking? Authorizing Provider  doxycycline (VIBRAMYCIN) 100 MG capsule Take 1 capsule (100 mg total) by mouth 2 (two) times daily. 07/10/21  Yes Pearson Forster, NP  cyclobenzaprine (FLEXERIL) 5 MG tablet Take 1 tablet (5 mg total) by mouth 3 (three) times daily as needed for muscle spasms. 12/11/20   Kathie Dike, MD  DULoxetine (CYMBALTA) 30 MG capsule Take 30 mg by mouth daily. 11/26/20   [provider]  losartan-hydrochlorothiazide (HYZAAR) 50-12.5 MG tablet Take 1 tablet by mouth daily. 03/21/18   [provider]  Multiple Vitamins-Minerals (MULTIVITAMIN ADULTS 50+) TABS Take 1 tablet by mouth daily.    [provider]  nitroGLYCERIN (NITROSTAT) 0.4 MG SL tablet Place 1 tablet (0.4 mg total) under the tongue every 5 (five) minutes as needed for chest pain. 1/61/09   Delora Fuel, MD  pantoprazole (PROTONIX) 40 MG tablet Take 40 mg by mouth daily.    [provider]  rosuvastatin (CRESTOR) 20 MG tablet Take 20 mg by mouth daily.    [provider]  traMADol (ULTRAM) 50 MG tablet Take 1 tablet (50 mg total) by mouth every 12  (twelve) hours as needed. 03/12/21   Truitt Merle, MD  trolamine salicylate (ASPERCREME) 10 % cream Apply 1 application topically as needed for muscle pain.    [provider]    Family History Family History  Problem Relation Age of Onset   Cancer Mother        brain   Heart disease Father    Heart disease Brother    Cancer Niece 36       breast   Cancer Niece 4       breast    Social History Social History   Tobacco Use   Smoking status: Never   Smokeless tobacco: Never  Vaping Use   Vaping Use: Never used  Substance Use Topics   Alcohol use: Yes    Comment: wine rarely   Drug use: No     Allergies   Patient has no known allergies.   Review of Systems Review of Systems  Skin:  Positive for wound.  All other systems reviewed and are negative.   Physical Exam Triage Vital Signs ED Triage Vitals  Enc Vitals Group     BP 07/10/21 1705 139/90     Pulse Rate 07/10/21 1705 86     Resp 07/10/21 1705 16     Temp 07/10/21 1705 98.6 F (37 C)     Temp Source 07/10/21 1705 Oral     SpO2 07/10/21 1705 95 %     Weight --      Height --      Head Circumference --      Peak Flow --      Pain Score 07/10/21 1707 5     Pain Loc --      Pain Edu? --      Excl. in [place]? --    No data found.  Updated Vital Signs BP 139/90 (BP Location: Left Arm)   Pulse 86   Temp 98.6 F (37 C) (Oral)   Resp 16   SpO2 95%   Visual Acuity Right Eye Distance:   Left Eye Distance:   Bilateral Distance:    Right Eye Near:   Left Eye Near:    Bilateral Near:     Physical Exam Vitals and nursing note reviewed.  Constitutional:      General: She is not in acute distress.    Appearance: Normal appearance. She is not ill-appearing, toxic-appearing or diaphoretic.  HENT:     Head: Normocephalic and atraumatic.  Eyes:     Conjunctiva/sclera: Conjunctivae normal.  Cardiovascular:     Rate and Rhythm: Normal rate and regular rhythm.     Pulses: Normal pulses.     Heart  sounds: Normal heart sounds.  Pulmonary:     Effort: Pulmonary effort is normal.     Breath sounds: Normal breath sounds.  Abdominal:     General: Abdomen is flat.  Musculoskeletal:        General: Normal range of motion.     Right hand: Swelling (redness, swelling,  and tenderness around right 5th fingernail, small laceration noted) present.     Cervical back: Normal range of motion.  Skin:    General: Skin is warm and dry.  Neurological:     General: No focal deficit present.     Mental Status: She is alert and oriented to person, place, and time.  Psychiatric:        Mood and Affect: Mood normal.     UC Treatments / Results  Labs (all labs ordered are listed, but only abnormal results are displayed) Labs Reviewed - No data to display  EKG   Radiology No results found.  Procedures Procedures (including critical care time)  Medications Ordered in UC Medications - No data to display  Initial Impression / Assessment and Plan / UC Course  I have reviewed the triage vital signs and the nursing notes.  Pertinent labs & imaging results that were available during my care of the patient were reviewed by me and considered in my medical decision making (see chart for details).    Assessment negative for red flags or concerns.  Paronychia of the right fifth finger.  We will treat with doxycycline twice daily for the next 10 days.  Recommend warm compresses or Epsom salt soaks 3-4 times a day.  Tylenol and or ibuprofen as needed.  Strict return precautions for any signs of worsening infection. Final Clinical Impressions(s) / UC Diagnoses   Final diagnoses:  Paronychia of finger, right     Discharge Instructions      Take the doxycycline 1 pill twice a day for the next 10 days.  Apply a warm compress or epsom salt salt to the area 3-4 times a day. You can take Tylenol and/or ibuprofen as needed for pain relief and fever reduction.  Return for reevaluation for any  worsening symptoms including worsening redness, swelling, red streaks, or fevers.     ED Prescriptions     Medication Sig Dispense Auth. Provider   doxycycline (VIBRAMYCIN) 100 MG capsule Take 1 capsule (100 mg total) by mouth 2 (two) times daily. 20 capsule Pearson Forster, NP      PDMP not reviewed this encounter.   Pearson Forster, NP 07/10/21 [phone]

## 2021-07-30 ENCOUNTER — Other Ambulatory Visit: Payer: Self-pay

## 2021-07-30 ENCOUNTER — Inpatient Hospital Stay: Payer: Medicare Other | Attending: Hematology

## 2021-07-30 VITALS — BP 149/87 | HR 60 | Temp 98.6°F | Resp 16

## 2021-07-30 DIAGNOSIS — C7A8 Other malignant neuroendocrine tumors: Secondary | ICD-10-CM | POA: Insufficient documentation

## 2021-07-30 DIAGNOSIS — C7B8 Other secondary neuroendocrine tumors: Secondary | ICD-10-CM | POA: Insufficient documentation

## 2021-07-30 MED ORDER — OCTREOTIDE ACETATE 30 MG IM KIT
30.0000 mg | PACK | Freq: Once | INTRAMUSCULAR | Status: AC
  Administered 2021-07-30: 12:00:00 30 mg via INTRAMUSCULAR
  Filled 2021-07-30: qty 1

## 2021-07-30 NOTE — Patient Instructions (Signed)
Octreotide injection solution What is this medication? OCTREOTIDE (ok TREE oh tide) is used to reduce blood levels of growth hormone in patients with a condition called acromegaly. This medicine also reduces flushing and watery diarrhea caused by certain types of cancer. This medicine may be used for other purposes; ask your health care provider or pharmacist if you have questions. COMMON BRAND NAME(S): Bynfezia, Sandostatin What should I tell my care team before I take this medication? They need to know if you have any of these conditions: diabetes gallbladder disease kidney disease liver disease thyroid disease an unusual or allergic reaction to octreotide, other medicines, foods, dyes, or preservatives pregnant or trying to get pregnant breast-feeding How should I use this medication? This medicine is for injection under the skin or into a vein (only in emergency situations). It is usually given by a health care professional in a hospital or clinic setting. If you get this medicine at home, you will be taught how to prepare and give this medicine. Allow the injection solution to come to room temperature before use. Do not warm it artificially. Use exactly as directed. Take your medicine at regular intervals. Do not take your medicine more often than directed. It is important that you put your used needles and syringes in a special sharps container. Do not put them in a trash can. If you do not have a sharps container, call your pharmacist or healthcare provider to get one. Talk to your pediatrician regarding the use of this medicine in children. Special care may be needed. Overdosage: If you think you have taken too much of this medicine contact a poison control center or emergency room at once. NOTE: This medicine is only for you. Do not share this medicine with others. What if I miss a dose? If you miss a dose, take it as soon as you can. If it is almost time for your next dose, take only  that dose. Do not take double or extra doses. What may interact with this medication? bromocriptine certain medicines for blood pressure, heart disease, irregular heartbeat cyclosporine diuretics medicines for diabetes, including insulin quinidine This list may not describe all possible interactions. Give your health care provider a list of all the medicines, herbs, non-prescription drugs, or dietary supplements you use. Also tell them if you smoke, drink alcohol, or use illegal drugs. Some items may interact with your medicine. What should I watch for while using this medication? Visit your doctor or health care professional for regular checks on your progress. To help reduce irritation at the injection site, use a different site for each injection and make sure the solution is at room temperature before use. This medicine may cause decreases in blood sugar. Signs of low blood sugar include chills, cool, pale skin or cold sweats, drowsiness, extreme hunger, fast heartbeat, headache, nausea, nervousness or anxiety, shakiness, trembling, unsteadiness, tiredness, or weakness. Contact your doctor or health care professional right away if you experience any of these symptoms. This medicine may increase blood sugar. Ask your healthcare provider if changes in diet or medicines are needed if you have diabetes. This medicine may cause a decrease in vitamin B12. You should make sure that you get enough vitamin B12 while you are taking this medicine. Discuss the foods you eat and the vitamins you take with your health care professional. What side effects may I notice from receiving this medication? Side effects that you should report to your doctor or health care professional as soon as   possible: allergic reactions like skin rash, itching or hives, swelling of the face, lips, or tongue fast, slow, or irregular heartbeat right upper belly pain severe stomach pain signs and symptoms of high blood sugar such  as being more thirsty or hungry or having to urinate more than normal. You may also feel very tired or have blurry vision. signs and symptoms of low blood sugar such as feeling anxious; confusion; dizziness; increased hunger; unusually weak or tired; increased sweating; shakiness; cold, clammy skin; irritable; headache; blurred vision; fast heartbeat; loss of consciousness unusually weak or tired Side effects that usually do not require medical attention (report to your doctor or health care professional if they continue or are bothersome): diarrhea dizziness gas headache nausea, vomiting pain, redness, or irritation at site where injected upset stomach This list may not describe all possible side effects. Call your doctor for medical advice about side effects. You may report side effects to FDA at 1-800-FDA-1088. Where should I keep my medication? Keep out of the reach of children. Store in a refrigerator between 2 and 8 degrees C (36 and 46 degrees F). Protect from light. Allow to come to room temperature naturally. Do not use artificial heat. If protected from light, the injection may be stored at room temperature between 20 and 30 degrees C (70 and 86 degrees F) for 14 days. After the initial use, throw away any unused portion of a multiple dose vial after 14 days. Throw away unused portions of the ampules after use. NOTE: This sheet is a summary. It may not cover all possible information. If you have questions about this medicine, talk to your doctor, pharmacist, or health care provider.  2022 [employer]/Gold Standard (2019-03-29 00:00:00)  

## 2021-08-13 DIAGNOSIS — C7B8 Other secondary neuroendocrine tumors: Secondary | ICD-10-CM | POA: Insufficient documentation

## 2021-08-13 DIAGNOSIS — C7A8 Other malignant neuroendocrine tumors: Secondary | ICD-10-CM | POA: Insufficient documentation

## 2021-08-17 ENCOUNTER — Other Ambulatory Visit: Payer: Self-pay

## 2021-08-18 ENCOUNTER — Other Ambulatory Visit: Payer: Self-pay

## 2021-08-20 ENCOUNTER — Other Ambulatory Visit: Payer: Self-pay | Admitting: *Deleted

## 2021-08-20 DIAGNOSIS — C7A019 Malignant carcinoid tumor of the small intestine, unspecified portion: Secondary | ICD-10-CM

## 2021-08-20 DIAGNOSIS — C7B8 Other secondary neuroendocrine tumors: Secondary | ICD-10-CM | POA: Diagnosis present

## 2021-08-20 DIAGNOSIS — C7A8 Other malignant neuroendocrine tumors: Secondary | ICD-10-CM | POA: Diagnosis present

## 2021-08-23 LAB — 5 HIAA, QUANTITATIVE, URINE, 24 HOUR
5-HIAA, Ur: 2.9 mg/L
5-HIAA,Quant.,24 Hr Urine: 3.6 mg/24 hr (ref 0.0–14.9)
Total Volume: 1250

## 2021-08-24 ENCOUNTER — Other Ambulatory Visit: Payer: Medicare Other

## 2021-08-27 ENCOUNTER — Ambulatory Visit: Payer: Medicare Other | Admitting: Hematology

## 2021-08-27 ENCOUNTER — Other Ambulatory Visit: Payer: Self-pay

## 2021-08-27 ENCOUNTER — Ambulatory Visit (HOSPITAL_COMMUNITY)
Admission: RE | Admit: 2021-08-27 | Discharge: 2021-08-27 | Disposition: A | Discharge status: Home / Self Care | Payer: Medicare Other | Source: Ambulatory Visit | Attending: Hematology | Admitting: Hematology

## 2021-08-27 ENCOUNTER — Ambulatory Visit: Payer: Medicare Other

## 2021-08-27 ENCOUNTER — Inpatient Hospital Stay: Payer: Medicare Other | Attending: Hematology

## 2021-08-27 DIAGNOSIS — C7A8 Other malignant neuroendocrine tumors: Secondary | ICD-10-CM | POA: Diagnosis not present

## 2021-08-27 DIAGNOSIS — C7A019 Malignant carcinoid tumor of the small intestine, unspecified portion: Secondary | ICD-10-CM

## 2021-08-27 LAB — CBC WITH DIFFERENTIAL (CANCER CENTER ONLY)
Abs Immature Granulocytes: 0.01 10*3/uL (ref 0.00–0.07)
Basophils Absolute: 0 10*3/uL (ref 0.0–0.1)
Basophils Relative: 1 %
Eosinophils Absolute: 0.1 10*3/uL (ref 0.0–0.5)
Eosinophils Relative: 3 %
HCT: 43.6 % (ref 36.0–46.0)
Hemoglobin: 14.3 g/dL (ref 12.0–15.0)
Immature Granulocytes: 0 %
Lymphocytes Relative: 36 %
Lymphs Abs: 1.5 10*3/uL (ref 0.7–4.0)
MCH: 28.7 pg (ref 26.0–34.0)
MCHC: 32.8 g/dL (ref 30.0–36.0)
MCV: 87.4 fL (ref 80.0–100.0)
Monocytes Absolute: 0.3 10*3/uL (ref 0.1–1.0)
Monocytes Relative: 7 %
Neutro Abs: 2.2 10*3/uL (ref 1.7–7.7)
Neutrophils Relative %: 53 %
Platelet Count: 310 10*3/uL (ref 150–400)
RBC: 4.99 MIL/uL (ref 3.87–5.11)
RDW: 12.5 % (ref 11.5–15.5)
WBC Count: 4.2 10*3/uL (ref 4.0–10.5)
nRBC: 0 % (ref 0.0–0.2)

## 2021-08-27 LAB — IRON AND TIBC
Iron: 87 ug/dL (ref 41–142)
Saturation Ratios: 32 % (ref 21–57)
TIBC: 269 ug/dL (ref 236–444)
UIBC: 182 ug/dL (ref 120–384)

## 2021-08-27 LAB — CMP (CANCER CENTER ONLY)
ALT: 13 U/L (ref 0–44)
AST: 17 U/L (ref 15–41)
Albumin: 3.9 g/dL (ref 3.5–5.0)
Alkaline Phosphatase: 65 U/L (ref 38–126)
Anion gap: 10 (ref 5–15)
BUN: 8 mg/dL (ref 8–23)
CO2: 27 mmol/L (ref 22–32)
Calcium: 9.1 mg/dL (ref 8.9–10.3)
Chloride: 105 mmol/L (ref 98–111)
Creatinine: 0.92 mg/dL (ref 0.44–1.00)
GFR, Estimated: >60 mL/min (ref >60)
Glucose, Bld: 122 mg/dL — ABNORMAL HIGH (ref 70–99)
Potassium: 3.9 mmol/L (ref 3.5–5.1)
Sodium: 142 mmol/L (ref 135–145)
Total Bilirubin: 0.6 mg/dL (ref 0.3–1.2)
Total Protein: 7.3 g/dL (ref 6.5–8.1)

## 2021-08-27 LAB — FERRITIN: Ferritin: 39 ng/mL (ref 11–307)

## 2021-08-27 MED ORDER — IOHEXOL 350 MG/ML SOLN
80.0000 mL | Freq: Once | INTRAVENOUS | Status: AC | PRN
  Administered 2021-08-27: 80 mL via INTRAVENOUS

## 2021-08-28 LAB — CHROMOGRANIN A: Chromogranin A (ng/mL): 36.8 ng/mL (ref 0.0–101.8)

## 2021-09-03 ENCOUNTER — Inpatient Hospital Stay (HOSPITAL_BASED_OUTPATIENT_CLINIC_OR_DEPARTMENT_OTHER): Payer: Medicare Other | Admitting: Hematology

## 2021-09-03 ENCOUNTER — Other Ambulatory Visit: Payer: Self-pay

## 2021-09-03 ENCOUNTER — Encounter: Payer: Self-pay | Admitting: Hematology

## 2021-09-03 ENCOUNTER — Inpatient Hospital Stay: Payer: Medicare Other

## 2021-09-03 VITALS — BP 133/84 | HR 81 | Temp 98.5°F | Resp 16 | Ht 64.0 in | Wt 185.5 lb

## 2021-09-03 DIAGNOSIS — C7A8 Other malignant neuroendocrine tumors: Secondary | ICD-10-CM | POA: Diagnosis not present

## 2021-09-03 MED ORDER — OCTREOTIDE ACETATE 30 MG IM KIT
30.0000 mg | PACK | Freq: Once | INTRAMUSCULAR | Status: AC
  Administered 2021-09-03: 12:00:00 30 mg via INTRAMUSCULAR
  Filled 2021-09-03: qty 1

## 2021-09-03 NOTE — Progress Notes (Signed)
[place]   [phone] Fax:(336) [phone]   Clinic Follow up Note   Patient Care Team: Katherina Mires, MD as PCP - General (Family Medicine) Sueanne Margarita, MD as PCP - Sleep Medicine (Cardiology) Belva Crome, MD as PCP - Cardiology (Cardiology) Pieter Partridge, DO as Consulting Physician (Neurology) Alla Feeling, NP as Nurse Practitioner (Nurse Practitioner) Truitt Merle, MD as Consulting Physician (Oncology) Jonnie Finner, RN (Inactive) as Oncology Nurse Navigator  Date of Service:  09/03/2021  CHIEF COMPLAINT: f/u of metastatic neuroendocrine tumor  CURRENT THERAPY:  Sandostatin, starting 03/12/21  ASSESSMENT & PLAN:  Tanya Walsh is a 65 y.o. female with   1. Metastatic neuroendocrine tumor of small bowel, to mesentery and liver, stage IV -she presented with symptomatic anemia from GI bleeding, abdominal pain, and flushing in 11/2020, DOTATATE PET on 02/16/21 showed approximately 7 discrete small bowel primaries and the mesentery implants and a single liver metastasis, findings are diagnostic for metastatic neuroendocrine tumor of the small bowel to the mesentery and liver.   -Liver biopsy was attempted on 03/06/21, but the hepatic lesion could not be localized with ultrasound.  -She began Sandostatin on 03/12/21.  -Discussion with Dr. Malissa Hippo on 04/08/21 determined that the risks of small bowel endoscopy and biopsy outweigh the benefit and the procedure was cancelled. -She tolerated first dose 20mg  Sandostatin injection well, but did have more abdominal cramps after second dose 30mg . These have decreased with each injection. -CT AP on 08/27/21 showed stable disease. I reviewed the images and discussed with her. Will repeat in 6 months. -last 24hr urine on 08/20/21 was normal. I discussed that we can spread these out to every 12 months. -labs from 12/15 reviewed, all WNL. She continues to do well on the Sandostatin injections and notes her symptoms  are slowly improving. Will proceed with 7th dose today.   2. Abdominal pain, flushing -Secondary to #1, managed with tylenol -Given the multiple small bowel lesions and mesenteric metastasis, she is at high risk for bowel obstruction.  -We recommend low residue diet.  She has been seen by our dietician. -she reports several instances of flushing, and feeling like she's going to pass out. She notes this resolved after eating. -overall, she notes improvement in her symptoms since starting treatment.   3. Back pain -She reported worsening chronic back pain, up to 10/10 pain. I prescribed tramadol on 03/02/21. I reviewed that she should take this only as needed. -she also has flexeril to use. -She is currently on 30 mg of Cymbalta for anxiety/depression. I previously discussed the interaction between cymbalta and tramadol and advised her not to take them at the same time.   4. CAD, HTN, pre-DM -On losartan-HCTZ and rosuvastatin -Follow-up with PCP and cardiology     PLAN: -proceed with Sandostatin 30mg  today and every 4 weeks -lab and f/u in 3 months   No problem-specific Assessment & Plan notes found for this encounter.   SUMMARY OF ONCOLOGIC HISTORY: Oncology History Overview Note   Cancer Staging  Primary malignant neuroendocrine tumor of small intestine ([place]) Staging form: Gastrointestinal Stromal Tumor - Small Intestinal, Esophageal, Colorectal, Mesenteric, and Peritoneal GIST, AJCC 8th Edition - Clinical: Stage IV (cTX, cN0, pM1) - Signed by Alla Feeling, NP on 02/26/2021    Primary malignant neuroendocrine tumor of small intestine ([place])  12/10/2020 Procedure   Upper endoscopy, Dr. Ronnette Juniper Impression - Normal upper third of esophagus, middle third of esophagus and lower  third of esophagus. - Z-line regular, 38 cm from the incisors. - Erythematous mucosa in the antrum. Biopsied. Clip (MR conditional) was placed. - Normal examined duodenum. Biopsied.   12/10/2020  Pathology Results   FINAL MICROSCOPIC DIAGNOSIS:   A. DUODENUM, BIOPSY:  - Duodenal mucosa with no significant pathologic findings.  - Negative for increased intraepithelial lymphocytes and villous  architectural changes.   B. STOMACH, ANTRUM, BIOPSY:  - Reactive gastropathy.  - Warthin-Starry stain is negative for Helicobacter pylori.    12/11/2020 Procedure   Colonoscopy, Dr. Ronnette Juniper impression - One 5 mm polyp in the transverse colon, removed piecemeal using a cold biopsy forceps. Resected and retrieved. - Diverticulosis in the sigmoid colon, in the descending colon and in the transverse colon. - The examined portion of the ileum was normal. - Non-bleeding internal hemorrhoids.   12/11/2020 Pathology Results   FINAL MICROSCOPIC DIAGNOSIS:   A. COLON, TRANSVERSE, POLYPECTOMY:  - Tubular adenoma.  - Negative for high grade dysplasia.    12/24/2020 Procedure   Capsule endoscopy report: A polypoid nodular growth was noted at 3 hours and 30 minutes and an ulcerated, polypoid growth was noted at 3 hours and 54 minutes which is likely the cause of obscure GI blood loss   01/23/2021 Imaging   CT entero AP w contrast IMPRESSION: 1. No acute findings identified within the abdomen or pelvis. 2. There are several focal areas of intraluminal hyperenhancement within the small bowel loops. The largest is in the nondilated mid to distal jejunum measuring 1.3 cm. Cannot rule out underlying small bowel neoplasm. Correlation with tissue sampling results advised. Correlation with results from capsule endoscopy. 3. There is a enhancing soft tissue nodule with central calcification within the central small bowel mesentery. This is nonspecific and may represent sequelae of inflammation/infection. Primary differential considerations include carcinoid tumor or metastatic adenopathy. If there is a clinical concern for carcinoid tumor consider further investigation with DOTATATE PET. 4. Left  adrenal gland adenoma. 5. Distal colonic diverticulosis without signs of acute diverticulitis. 6. Aortic atherosclerosis.   02/16/2021 PET scan   IMPRESSION: 1. Evidence of well differentiated small bowel neuroendocrine tumor with mesenteric metastasis. 2. Approximately 7 discrete small bowel lesions with intense radiotracer activity. Lesion depicted on comparison CT enterography. 3. Two intensely radiotracer avid mesenteric implants centrally within the small bowel mesentery. 4. Single hepatic metastasis with a second potential hepatic metastasis versus peritoneal implant. 5. No small bowel obstruction. 6. No evidence of metastatic disease outside the abdomen pelvis. 7. Patient may be a candidate for peptide receptor radiotherapy (Lu-177 DOTATATE) available at [facility] [phone]).   02/26/2021 Initial Diagnosis   Primary malignant neuroendocrine tumor of small intestine ([place])   02/26/2021 Cancer Staging   Staging form: Gastrointestinal Stromal Tumor - Small Intestinal, Esophageal, Colorectal, Mesenteric, and Peritoneal GIST, AJCC 8th Edition - Clinical: Stage IV (cTX, cN0, pM1) - Signed by Alla Feeling, NP on 02/26/2021    08/27/2021 Imaging   EXAM: CT ABDOMEN AND PELVIS WITH CONTRAST  IMPRESSION: 1. No new or progressive interval findings. 2. Stable appearance of the 17 mm nodule central mesenteric nodule seen to be hypermetabolic on recent PET-CT. The second mesenteric lesion identified on that study is not discernible today. 3. Stable 1.5 cm subcapsular low-density lesion posterior right liver since PET-CT 02/16/2021. 4. 2 small bowel nodules are identified on imaging today although 7 discrete hypermetabolic small bowel lesions were seen on previous PET-CT. 5. Left colonic diverticulosis without diverticulitis. 6. Aortic  Atherosclerosis (ICD10-I70.0).      INTERVAL HISTORY:  Tanya Walsh is here for a follow up of metastatic NET.  She was last seen by me on 07/02/21. She presents to the clinic accompanied by a family member. She reports she has been having a lot of back pain (this was noted previously). She is unsure if it's related to cancer or her fibromyalgia. She adds that sometimes the pain feels like it has weight to it. She reports the pain comes and goes and doesn't last very long. She notes she last experienced the pain a few days ago.   All other systems were reviewed with the patient and are negative.  MEDICAL HISTORY:  Past Medical History:  Diagnosis Date   Coronary artery disease    Depression    GI bleed 07/17/2015   Hypertension    Myocardial infarction [facility])    Nocturia 08/14/2019   Refusal of blood transfusions as patient is Jehovah's Witness     SURGICAL HISTORY: Past Surgical History:  Procedure Laterality Date   ABDOMINAL HYSTERECTOMY     BACK SURGERY     BIOPSY  12/10/2020   Procedure: BIOPSY;  Surgeon: Ronnette Juniper, MD;  Location: [facility] ENDOSCOPY;  Service: Gastroenterology;;   BIOPSY  12/11/2020   Procedure: BIOPSY;  Surgeon: Ronnette Juniper, MD;  Location: [facility] ENDOSCOPY;  Service: Gastroenterology;;   COLONOSCOPY WITH PROPOFOL N/A 12/11/2020   Procedure: COLONOSCOPY WITH PROPOFOL;  Surgeon: Ronnette Juniper, MD;  Location: [place];  Service: Gastroenterology;  Laterality: N/A;   ESOPHAGOGASTRODUODENOSCOPY N/A 07/17/2015   Procedure: ESOPHAGOGASTRODUODENOSCOPY (EGD);  Surgeon: Wonda Horner, MD;  Location: [facility] ENDOSCOPY;  Service: Endoscopy;  Laterality: N/A;   ESOPHAGOGASTRODUODENOSCOPY (EGD) WITH PROPOFOL N/A 12/10/2020   Procedure: ESOPHAGOGASTRODUODENOSCOPY (EGD) WITH PROPOFOL;  Surgeon: Ronnette Juniper, MD;  Location: [place];  Service: Gastroenterology;  Laterality: N/A;   HEMOSTASIS CLIP PLACEMENT  12/10/2020   Procedure: HEMOSTASIS CLIP PLACEMENT;  Surgeon: Ronnette Juniper, MD;  Location: [place];  Service: Gastroenterology;;    I have reviewed the social history and family history with the  patient and they are unchanged from previous note.  ALLERGIES:  has No Known Allergies.  MEDICATIONS:  Current Outpatient Medications  Medication Sig Dispense Refill   cyclobenzaprine (FLEXERIL) 5 MG tablet Take 1 tablet (5 mg total) by mouth 3 (three) times daily as needed for muscle spasms. 30 tablet 0   DULoxetine (CYMBALTA) 30 MG capsule Take 30 mg by mouth daily.     losartan-hydrochlorothiazide (HYZAAR) 50-12.5 MG tablet Take 1 tablet by mouth daily.     Multiple Vitamins-Minerals (MULTIVITAMIN ADULTS 50+) TABS Take 1 tablet by mouth daily.     nitroGLYCERIN (NITROSTAT) 0.4 MG SL tablet Place 1 tablet (0.4 mg total) under the tongue every 5 (five) minutes as needed for chest pain. 30 tablet 0   pantoprazole (PROTONIX) 40 MG tablet Take 40 mg by mouth daily.     rosuvastatin (CRESTOR) 20 MG tablet Take 20 mg by mouth daily.     traMADol (ULTRAM) 50 MG tablet Take 1 tablet (50 mg total) by mouth every 12 (twelve) hours as needed. 30 tablet 0   trolamine salicylate (ASPERCREME) 10 % cream Apply 1 application topically as needed for muscle pain.     No current facility-administered medications for this visit.    PHYSICAL EXAMINATION: ECOG PERFORMANCE STATUS: 1 - Symptomatic but completely ambulatory  Vitals:   09/03/21 1058  BP: 133/84  Pulse: 81  Resp: 16  Temp: 98.5 F (  36.9 C)  SpO2: 99%   Wt Readings from Last 3 Encounters:  09/03/21 185 lb 8 oz (84.1 kg)  07/02/21 188 lb 8 oz (85.5 kg)  05/07/21 187 lb 3.2 oz (84.9 kg)    GENERAL:alert, no distress and comfortable SKIN: skin color normal, no rashes or significant lesions EYES: normal, Conjunctiva are pink and non-injected, sclera clear  NEURO: alert & oriented x 3 with fluent speech  LABORATORY DATA:  I have reviewed the data as listed CBC Latest Ref Rng & Units 08/27/2021 07/02/2021 05/07/2021  WBC 4.0 - 10.5 K/uL 4.2 4.4 5.0  Hemoglobin 12.0 - 15.0 g/dL 14.3 13.4 13.2  Hematocrit 36.0 - 46.0 % 43.6 39.9 40.0   Platelets 150 - 400 K/uL 310 287 303     CMP Latest Ref Rng & Units 08/27/2021 07/02/2021 05/07/2021  Glucose 70 - 99 mg/dL 122(H) 121(H) 97  BUN 8 - 23 mg/dL 8 12 12   Creatinine 0.44 - 1.00 mg/dL 0.92 0.85 0.98  Sodium 135 - 145 mmol/L 142 137 141  Potassium 3.5 - 5.1 mmol/L 3.9 3.3(L) 3.7  Chloride 98 - 111 mmol/L 105 101 105  CO2 22 - 32 mmol/L 27 29 30   Calcium 8.9 - 10.3 mg/dL 9.1 9.1 9.4  Total Protein 6.5 - 8.1 g/dL 7.3 7.1 7.3  Total Bilirubin 0.3 - 1.2 mg/dL 0.6 0.3 0.5  Alkaline Phos 38 - 126 U/L 65 57 66  AST 15 - 41 U/L 17 19 18   ALT 0 - 44 U/L 13 20 18       RADIOGRAPHIC STUDIES: I have personally reviewed the radiological images as listed and agreed with the findings in the report. No results found.    No orders of the defined types were placed in this encounter.  All questions were answered. The patient knows to call the clinic with any problems, questions or concerns. No barriers to learning was detected. The total time spent in the appointment was 30 minutes.     Truitt Merle, MD 09/03/2021   I, Wilburn Mylar, am acting as scribe for Truitt Merle, MD.   I have reviewed the above documentation for accuracy and completeness, and I agree with the above.

## 2021-10-01 ENCOUNTER — Inpatient Hospital Stay: Payer: Medicare Other | Attending: Hematology

## 2021-10-01 ENCOUNTER — Other Ambulatory Visit: Payer: Self-pay

## 2021-10-01 VITALS — BP 135/88 | HR 75 | Temp 98.3°F | Resp 18

## 2021-10-01 DIAGNOSIS — C7B8 Other secondary neuroendocrine tumors: Secondary | ICD-10-CM | POA: Insufficient documentation

## 2021-10-01 DIAGNOSIS — C7A8 Other malignant neuroendocrine tumors: Secondary | ICD-10-CM | POA: Insufficient documentation

## 2021-10-01 MED ORDER — OCTREOTIDE ACETATE 30 MG IM KIT
30.0000 mg | PACK | Freq: Once | INTRAMUSCULAR | Status: AC
  Administered 2021-10-01: 30 mg via INTRAMUSCULAR
  Filled 2021-10-01: qty 1

## 2021-10-23 ENCOUNTER — Other Ambulatory Visit: Payer: Self-pay | Admitting: Family Medicine

## 2021-10-23 DIAGNOSIS — Z1231 Encounter for screening mammogram for malignant neoplasm of breast: Secondary | ICD-10-CM

## 2021-10-29 ENCOUNTER — Other Ambulatory Visit: Payer: Self-pay

## 2021-10-29 ENCOUNTER — Inpatient Hospital Stay: Payer: Medicare Other | Attending: Hematology

## 2021-10-29 VITALS — BP 131/78 | HR 81 | Temp 98.2°F | Resp 18

## 2021-10-29 DIAGNOSIS — C7B8 Other secondary neuroendocrine tumors: Secondary | ICD-10-CM | POA: Insufficient documentation

## 2021-10-29 DIAGNOSIS — C7A8 Other malignant neuroendocrine tumors: Secondary | ICD-10-CM | POA: Insufficient documentation

## 2021-10-29 MED ORDER — OCTREOTIDE ACETATE 30 MG IM KIT
30.0000 mg | PACK | Freq: Once | INTRAMUSCULAR | Status: AC
  Administered 2021-10-29: 30 mg via INTRAMUSCULAR
  Filled 2021-10-29: qty 1

## 2021-10-29 NOTE — Patient Instructions (Signed)
Octreotide injection solution What is this medication? OCTREOTIDE (ok TREE oh tide) is used to reduce blood levels of growth hormone in patients with a condition called acromegaly. This medicine also reduces flushing and watery diarrhea caused by certain types of cancer. This medicine may be used for other purposes; ask your health care provider or pharmacist if you have questions. COMMON BRAND NAME(S): Bynfezia, Sandostatin What should I tell my care team before I take this medication? They need to know if you have any of these conditions: diabetes gallbladder disease kidney disease liver disease thyroid disease an unusual or allergic reaction to octreotide, other medicines, foods, dyes, or preservatives pregnant or trying to get pregnant breast-feeding How should I use this medication? This medicine is for injection under the skin or into a vein (only in emergency situations). It is usually given by a health care professional in a hospital or clinic setting. If you get this medicine at home, you will be taught how to prepare and give this medicine. Allow the injection solution to come to room temperature before use. Do not warm it artificially. Use exactly as directed. Take your medicine at regular intervals. Do not take your medicine more often than directed. It is important that you put your used needles and syringes in a special sharps container. Do not put them in a trash can. If you do not have a sharps container, call your pharmacist or healthcare provider to get one. Talk to your pediatrician regarding the use of this medicine in children. Special care may be needed. Overdosage: If you think you have taken too much of this medicine contact a poison control center or emergency room at once. NOTE: This medicine is only for you. Do not share this medicine with others. What if I miss a dose? If you miss a dose, take it as soon as you can. If it is almost time for your next dose, take only  that dose. Do not take double or extra doses. What may interact with this medication? bromocriptine certain medicines for blood pressure, heart disease, irregular heartbeat cyclosporine diuretics medicines for diabetes, including insulin quinidine This list may not describe all possible interactions. Give your health care provider a list of all the medicines, herbs, non-prescription drugs, or dietary supplements you use. Also tell them if you smoke, drink alcohol, or use illegal drugs. Some items may interact with your medicine. What should I watch for while using this medication? Visit your doctor or health care professional for regular checks on your progress. To help reduce irritation at the injection site, use a different site for each injection and make sure the solution is at room temperature before use. This medicine may cause decreases in blood sugar. Signs of low blood sugar include chills, cool, pale skin or cold sweats, drowsiness, extreme hunger, fast heartbeat, headache, nausea, nervousness or anxiety, shakiness, trembling, unsteadiness, tiredness, or weakness. Contact your doctor or health care professional right away if you experience any of these symptoms. This medicine may increase blood sugar. Ask your healthcare provider if changes in diet or medicines are needed if you have diabetes. This medicine may cause a decrease in vitamin B12. You should make sure that you get enough vitamin B12 while you are taking this medicine. Discuss the foods you eat and the vitamins you take with your health care professional. What side effects may I notice from receiving this medication? Side effects that you should report to your doctor or health care professional as soon as   possible: allergic reactions like skin rash, itching or hives, swelling of the face, lips, or tongue fast, slow, or irregular heartbeat right upper belly pain severe stomach pain signs and symptoms of high blood sugar such  as being more thirsty or hungry or having to urinate more than normal. You may also feel very tired or have blurry vision. signs and symptoms of low blood sugar such as feeling anxious; confusion; dizziness; increased hunger; unusually weak or tired; increased sweating; shakiness; cold, clammy skin; irritable; headache; blurred vision; fast heartbeat; loss of consciousness unusually weak or tired Side effects that usually do not require medical attention (report to your doctor or health care professional if they continue or are bothersome): diarrhea dizziness gas headache nausea, vomiting pain, redness, or irritation at site where injected upset stomach This list may not describe all possible side effects. Call your doctor for medical advice about side effects. You may report side effects to FDA at 1-800-FDA-1088. Where should I keep my medication? Keep out of the reach of children. Store in a refrigerator between 2 and 8 degrees C (36 and 46 degrees F). Protect from light. Allow to come to room temperature naturally. Do not use artificial heat. If protected from light, the injection may be stored at room temperature between 20 and 30 degrees C (70 and 86 degrees F) for 14 days. After the initial use, throw away any unused portion of a multiple dose vial after 14 days. Throw away unused portions of the ampules after use. NOTE: This sheet is a summary. It may not cover all possible information. If you have questions about this medicine, talk to your doctor, pharmacist, or health care provider.  2022 [employer]/Gold Standard (2019-03-29 00:00:00)  

## 2021-11-10 ENCOUNTER — Ambulatory Visit
Admission: RE | Admit: 2021-11-10 | Discharge: 2021-11-10 | Disposition: A | Discharge status: Home / Self Care | Payer: Medicare Other | Source: Ambulatory Visit | Attending: Family Medicine | Admitting: Family Medicine

## 2021-11-10 DIAGNOSIS — Z1231 Encounter for screening mammogram for malignant neoplasm of breast: Secondary | ICD-10-CM

## 2021-11-19 ENCOUNTER — Telehealth: Payer: Self-pay | Admitting: Interventional Cardiology

## 2021-11-19 NOTE — Telephone Encounter (Signed)
? ?  Pt c/o BP issue: ? ?1. What are your last 5 BP readings?  ?03/07 - 167/112 ?03/08 - 143/107 ? ?2. Are you having any other symptoms (ex. Dizziness, headache, blurred vision, passed out)? Cramps legs and feels like going to passed out yesterday, lightheadedness  ? ?3. What is your medication issue? Pt said, her BP been elevated. She said yesterday she was feeling her leg cramps and felt she is going to passed out. Today she said she feel lightheaded when she try to move around. She also said she was diagnosed with cancer   ?

## 2021-11-19 NOTE — Telephone Encounter (Signed)
Pt states BP was elevated for the last two days.  Currently takes Losartan/HCTZ 50/12.5 around 8-9am.  Took BP after medication both times but was unsure if it had been at least 2 hours or not.  Yesterday she was bending over to put down some water and thought she was going to fall over and pass out.  She didn't and was able to sit down and let the sensation pass.  Denies CP or SOB.  Does have intermittent leg cramps.  Potassium runs between low and low normal.  Pt states she eats a banana everyday.  Last K level was 3.9 in December.  Pt admits to having salty bacon the day before she started having BP issues.  Advised pt to drink water to help flush the salt out but to increase oral K+ so it doesn't get too low.  She does have some OTC Potassium pills she will start taking.  Scheduled pt to see Dr. Tamala Julian on Monday as she hasn't been seen in about 20 months and was suppose to return in 6 months from the last visit.   ?

## 2021-11-22 NOTE — Progress Notes (Signed)
?Cardiology Office Note:   ? ?Date:  11/22/2021  ? ?ID:  Tanya Walsh, Tanya Walsh Sep 11, 1956, MRN [id number] ? ?PCP:  Katherina Mires, MD  ?Cardiologist:  Sinclair Grooms, MD  ? ?Referring MD: Katherina Mires, MD  ? ?No chief complaint on file. ? ? ?History of Present Illness:   ? ?Tanya Walsh is a 66 y.o. female with a hx of chest pain, hypertension, "history of myocardial infarction" with no verifying data available, and normal coronary angio 2021. ? ? ?She feels well.  She has been getting some abnormal blood pressure recordings.  They were elevated at the home of her employer.  They were in the 160/100 range.  The initial blood pressure here today is 128/90 mmHg.  No chest pain, dyspnea, orthopnea, PND.  No medication side effects. ? ?Past Medical History:  ?Diagnosis Date  ? Coronary artery disease   ? Depression   ? GI bleed 07/17/2015  ? Hypertension   ? Myocardial infarction [facility])   ? Nocturia 08/14/2019  ? Refusal of blood transfusions as patient is Jehovah's Witness   ? ? ?Past Surgical History:  ?Procedure Laterality Date  ? ABDOMINAL HYSTERECTOMY    ? BACK SURGERY    ? BIOPSY  12/10/2020  ? Procedure: BIOPSY;  Surgeon: Ronnette Juniper, MD;  Location: [place];  Service: Gastroenterology;;  ? BIOPSY  12/11/2020  ? Procedure: BIOPSY;  Surgeon: Ronnette Juniper, MD;  Location: [place];  Service: Gastroenterology;;  ? COLONOSCOPY WITH PROPOFOL N/A 12/11/2020  ? Procedure: COLONOSCOPY WITH PROPOFOL;  Surgeon: Ronnette Juniper, MD;  Location: [place];  Service: Gastroenterology;  Laterality: N/A;  ? ESOPHAGOGASTRODUODENOSCOPY N/A 07/17/2015  ? Procedure: ESOPHAGOGASTRODUODENOSCOPY (EGD);  Surgeon: Wonda Horner, MD;  Location: [facility] ENDOSCOPY;  Service: Endoscopy;  Laterality: N/A;  ? ESOPHAGOGASTRODUODENOSCOPY (EGD) WITH PROPOFOL N/A 12/10/2020  ? Procedure: ESOPHAGOGASTRODUODENOSCOPY (EGD) WITH PROPOFOL;  Surgeon: Ronnette Juniper, MD;  Location: [place];  Service: Gastroenterology;  Laterality: N/A;  ? HEMOSTASIS  CLIP PLACEMENT  12/10/2020  ? Procedure: HEMOSTASIS CLIP PLACEMENT;  Surgeon: Ronnette Juniper, MD;  Location: [place];  Service: Gastroenterology;;  ? ? ?Current Medications: ?No outpatient medications have been marked as taking for the 11/23/21 encounter (Appointment) with Belva Crome, MD.  ?  ? ?Allergies:   Patient has no known allergies.  ? ?Social History  ? ?Socioeconomic History  ? Marital status: Widowed  ?  Spouse name: Not on file  ? Number of children: 3  ? Years of education: Not on file  ? Highest education level: Not on file  ?Occupational History  ? Not on file  ?Tobacco Use  ? Smoking status: Never  ? Smokeless tobacco: Never  ?Vaping Use  ? Vaping Use: Never used  ?Substance and Sexual Activity  ? Alcohol use: Yes  ?  Comment: wine rarely  ? Drug use: No  ? Sexual activity: Not on file  ?Other Topics Concern  ? Not on file  ?Social History Narrative  ? Right handed  ? Drinks caffeine  ? One story home  ? ?Social Determinants of Health  ? ?Financial Resource Strain: Not on file  ?Food Insecurity: Not on file  ?Transportation Needs: Not on file  ?Physical Activity: Not on file  ?Stress: Not on file  ?Social Connections: Not on file  ?  ? ?Family History: ?The patient's family history includes Breast cancer in her niece; Cancer in her mother; Heart disease in her brother and father. ? ?ROS:   ?  Please see the history of present illness.    ?Occasional swelling over left eye.  Cramps in legs.  Intermittent dizziness.  Passed out once in the past.  Does have prodrome.  Has an intestinal tumor, metastatic neuroendocrine tumor of small bowel to mesentery and liver stage IV.  That Dr. Annamaria Boots is managing.  All other systems reviewed and are negative. ? ?EKGs/Labs/Other Studies Reviewed:   ? ?The following studies were reviewed today: ? ?CORONARY CTA 09/28/2019 ?IMPRESSION: ?1. Moderate mixed CAD of the proximal RCA, proximal to mid LAD and ?ostial ramus intermedius , CADRADS = 3. CT FFR will be performed  and ?reported separately. ?  ?2. Coronary calcium score of 594. This was 97th percentile for age ?and sex matched control. ?  ?3. Normal coronary origin with right dominance. ?  ?  ?Electronically Signed ?  By: Pixie Casino M.D. ?  On: 09/28/2019 13:07 ? ?EKG:  EKG normal sinus rhythm with normal overall EKG appearance. ? ?Recent Labs: ?12/11/2020: Magnesium 2.0 ?08/27/2021: ALT 13; BUN 8; Creatinine 0.92; Hemoglobin 14.3; Platelet Count 310; Potassium 3.9; Sodium 142  ?Recent Lipid Panel ?   ?Component Value Date/Time  ? CHOL 145 12/25/2019 1005  ? TRIG 62 12/25/2019 1005  ? HDL 61 12/25/2019 1005  ? CHOLHDL 2.4 12/25/2019 1005  ? CHOLHDL 4.3 07/08/2009 0530  ? VLDL 14 07/08/2009 0530  ? [place] 71 12/25/2019 1005  ? ? ?Physical Exam:   ? ?VS:  There were no vitals taken for this visit.   ? ?Wt Readings from Last 3 Encounters:  ?09/03/21 185 lb 8 oz (84.1 kg)  ?07/02/21 188 lb 8 oz (85.5 kg)  ?05/07/21 187 lb 3.2 oz (84.9 kg)  ?  ? ?GEN: Obese. No acute distress ?HEENT: Normal ?NECK: No JVD. ?LYMPHATICS: No lymphadenopathy ?CARDIAC: No murmur. RRR S4 gallop, or edema. ?VASCULAR:  Normal Pulses. No bruits. ?RESPIRATORY:  Clear to auscultation without rales, wheezing or rhonchi  ?ABDOMEN: Soft, non-tender, non-distended, No pulsatile mass, ?MUSCULOSKELETAL: No deformity  ?SKIN: Warm and dry ?NEUROLOGIC:  Alert and oriented x 3 ?PSYCHIATRIC:  Normal affect  ? ?ASSESSMENT:   ? ?1. Coronary artery calcification seen on CT scan   ?2. Essential hypertension   ?3. OSA (obstructive sleep apnea)   ?4. Morbid obesity ([place])   ? ?PLAN:   ? ?In order of problems listed above: ? ?Preventive therapy with blood pressure control and lipid-lowering.  Continue Hyzaar and Crestor. ?Blood pressure is elevated by my determination with right arm blood pressure 160/90 and left arm blood pressure 150/90 mmHg.  We will supply her with a blood pressure cuff.  Will react to blood pressures that she gets in her own environment at least 2  hours after she has her a.m. losartan HCT. ?Encouraged use of CPAP which will help control blood pressure. ?Encouraged aerobic activity. ? ?Target BP: <130/80 mmHg ? ?Diet and lifestyle measures for BP control were reviewed in detail: Low sodium diet (<2.5 gm daily); alcohol restriction (<3 ounces per day); weight loss (Mediterranean); avoid non-steroidal agents; > 6 hours sleep per day; 150 min moderate exercise per week. ?Medical regimen will include at least 2 agents. ?Resistant hypertension if not controlled on 3 agents. Consider further evaluation: Sleep study to r/o OSA; Renal angiogram; Primary hyperaldonism and Pheochromocytoma w/u. ?After 3 agents, consider MRA (spironolactone)/ Epleronone), hydralazine, beta-blocker, and Minoxidil if not already in use due to patient profile. ? ?68-monthfollow-up.  If blood pressure is not consistently below target 130/80 mmHg,  will increase losartan HCTZ to 100/12.5 mg/day.  Additional add on could include amlodipine 5 mg/day.  Pharmacy blood pressure clinic if we make adjustments in medications. ? ?Medication Adjustments/Labs and Tests Ordered: ?Current medicines are reviewed at length with the patient today.  Concerns regarding medicines are outlined above.  ?No orders of the defined types were placed in this encounter. ? ?No orders of the defined types were placed in this encounter. ? ? ?There are no Patient Instructions on file for this visit.  ? ?Signed, ?Sinclair Grooms, MD  ?11/22/2021 3:32 PM    ?[place] ?

## 2021-11-23 ENCOUNTER — Encounter: Payer: Self-pay | Admitting: Interventional Cardiology

## 2021-11-23 ENCOUNTER — Ambulatory Visit: Payer: Medicare Other | Admitting: Interventional Cardiology

## 2021-11-23 ENCOUNTER — Other Ambulatory Visit: Payer: Self-pay

## 2021-11-23 VITALS — BP 128/90 | HR 67 | Ht 63.0 in | Wt 191.0 lb

## 2021-11-23 DIAGNOSIS — I2510 Atherosclerotic heart disease of native coronary artery without angina pectoris: Secondary | ICD-10-CM

## 2021-11-23 DIAGNOSIS — I10 Essential (primary) hypertension: Secondary | ICD-10-CM

## 2021-11-23 DIAGNOSIS — G4733 Obstructive sleep apnea (adult) (pediatric): Secondary | ICD-10-CM | POA: Diagnosis not present

## 2021-11-23 NOTE — Patient Instructions (Signed)
Medication Instructions:  ?Your physician recommends that you continue on your current medications as directed. Please refer to the Current Medication list given to you today. ? ?*If you need a refill on your cardiac medications before your next appointment, please call your pharmacy* ? ? ?Lab Work: ?None ?If you have labs (blood work) drawn today and your tests are completely normal, you will receive your results only by: ?MyChart Message (if you have MyChart) OR ?A paper copy in the mail ?If you have any lab test that is abnormal or we need to change your treatment, we will call you to review the results. ? ? ?Testing/Procedures: ?None ? ? ?Follow-Up: ?At [facility], you and your health needs are our priority.  As part of our continuing mission to provide you with exceptional heart care, we have created designated Provider Care Teams.  These Care Teams include your primary Cardiologist (physician) and Advanced Practice Providers (APPs -  Physician Assistants and Nurse Practitioners) who all work together to provide you with the care you need, when you need it. ? ?We recommend signing up for the patient portal called "MyChart".  Sign up information is provided on this After Visit Summary.  MyChart is used to connect with patients for Virtual Visits (Telemedicine).  Patients are able to view lab/test results, encounter notes, upcoming appointments, etc.  Non-urgent messages can be sent to your provider as well.   ?To learn more about what you can do with MyChart, go to [email or web address].   ? ?Your next appointment:   ?6 month(s) ? ?The format for your next appointment:   ?In Person ? ?Provider:   ?Sinclair Grooms, MD  ? ?  ?

## 2021-11-25 ENCOUNTER — Other Ambulatory Visit: Payer: Self-pay | Admitting: *Deleted

## 2021-11-26 ENCOUNTER — Inpatient Hospital Stay: Payer: Medicare Other

## 2021-11-26 ENCOUNTER — Inpatient Hospital Stay: Payer: Medicare Other | Attending: Hematology

## 2021-11-26 ENCOUNTER — Inpatient Hospital Stay: Payer: Medicare Other | Admitting: Hematology

## 2021-11-26 ENCOUNTER — Other Ambulatory Visit: Payer: Self-pay

## 2021-11-26 ENCOUNTER — Encounter: Payer: Self-pay | Admitting: Hematology

## 2021-11-26 VITALS — BP 143/83 | HR 73 | Temp 97.8°F | Resp 18 | Ht 63.0 in | Wt 191.4 lb

## 2021-11-26 DIAGNOSIS — C7B8 Other secondary neuroendocrine tumors: Secondary | ICD-10-CM | POA: Insufficient documentation

## 2021-11-26 DIAGNOSIS — C7A8 Other malignant neuroendocrine tumors: Secondary | ICD-10-CM

## 2021-11-26 LAB — CBC WITH DIFFERENTIAL (CANCER CENTER ONLY)
Abs Immature Granulocytes: 0.01 10*3/uL (ref 0.00–0.07)
Basophils Absolute: 0 10*3/uL (ref 0.0–0.1)
Basophils Relative: 1 %
Eosinophils Absolute: 0.2 10*3/uL (ref 0.0–0.5)
Eosinophils Relative: 5 %
HCT: 40.6 % (ref 36.0–46.0)
Hemoglobin: 13.5 g/dL (ref 12.0–15.0)
Immature Granulocytes: 0 %
Lymphocytes Relative: 36 %
Lymphs Abs: 1.5 10*3/uL (ref 0.7–4.0)
MCH: 28.9 pg (ref 26.0–34.0)
MCHC: 33.3 g/dL (ref 30.0–36.0)
MCV: 86.9 fL (ref 80.0–100.0)
Monocytes Absolute: 0.3 10*3/uL (ref 0.1–1.0)
Monocytes Relative: 8 %
Neutro Abs: 2 10*3/uL (ref 1.7–7.7)
Neutrophils Relative %: 50 %
Platelet Count: 273 10*3/uL (ref 150–400)
RBC: 4.67 MIL/uL (ref 3.87–5.11)
RDW: 12.8 % (ref 11.5–15.5)
WBC Count: 4.1 10*3/uL (ref 4.0–10.5)
nRBC: 0 % (ref 0.0–0.2)

## 2021-11-26 LAB — CMP (CANCER CENTER ONLY)
ALT: 14 U/L (ref 0–44)
AST: 16 U/L (ref 15–41)
Albumin: 3.9 g/dL (ref 3.5–5.0)
Alkaline Phosphatase: 61 U/L (ref 38–126)
Anion gap: 4 — ABNORMAL LOW (ref 5–15)
BUN: 12 mg/dL (ref 8–23)
CO2: 32 mmol/L (ref 22–32)
Calcium: 9.4 mg/dL (ref 8.9–10.3)
Chloride: 104 mmol/L (ref 98–111)
Creatinine: 0.92 mg/dL (ref 0.44–1.00)
GFR, Estimated: >60 mL/min (ref >60)
Glucose, Bld: 160 mg/dL — ABNORMAL HIGH (ref 70–99)
Potassium: 4.1 mmol/L (ref 3.5–5.1)
Sodium: 140 mmol/L (ref 135–145)
Total Bilirubin: 0.4 mg/dL (ref 0.3–1.2)
Total Protein: 6.8 g/dL (ref 6.5–8.1)

## 2021-11-26 LAB — FERRITIN: Ferritin: 45 ng/mL (ref 11–307)

## 2021-11-26 MED ORDER — OCTREOTIDE ACETATE 30 MG IM KIT
30.0000 mg | PACK | Freq: Once | INTRAMUSCULAR | Status: AC
  Administered 2021-11-26: 30 mg via INTRAMUSCULAR
  Filled 2021-11-26: qty 1

## 2021-11-26 NOTE — Progress Notes (Signed)
?[place]   ?[phone] [phone]   ?Clinic Follow up Note  ? ?Patient Care Team: ?Katherina Mires, MD as PCP - General (Family Medicine) ?Sueanne Margarita, MD as PCP - Sleep Medicine (Cardiology) ?Belva Crome, MD as PCP - Cardiology (Cardiology) ?Pieter Partridge, DO as Consulting Physician (Neurology) ?Alla Feeling, NP as Nurse Practitioner (Nurse Practitioner) ?Truitt Merle, MD as Consulting Physician (Oncology) ?Jonnie Finner, RN (Inactive) as Oncology Nurse Navigator ? ?Date of Service:  11/26/2021 ? ?CHIEF COMPLAINT: f/u of metastatic neuroendocrine tumor ? ?CURRENT THERAPY:  ?Sandostatin, starting 03/12/21 ? ?ASSESSMENT & PLAN:  ?Levora P Siess is a 66 y.o. female with  ? ?1. Metastatic neuroendocrine tumor of small bowel, to mesentery and liver, stage IV ?-she presented with symptomatic anemia from GI bleeding, abdominal pain, and flushing in 11/2020, DOTATATE PET on 02/16/21 showed approximately 7 discrete small bowel primaries and the mesentery implants and a single liver metastasis, findings are diagnostic for metastatic neuroendocrine tumor of small bowel to mesentery and liver.   ?-Liver biopsy was attempted on 03/06/21, but the hepatic lesion could not be localized with ultrasound.  ?-She began Sandostatin on 03/12/21.  ?-Discussion with Dr. Malissa Hippo on 04/08/21 determined that the risks of small bowel endoscopy and biopsy outweigh the benefit and the procedure was cancelled. ?-She tolerated first dose '20mg'$  Sandostatin injection well, but did have more abdominal cramps after second dose '30mg'$ . These have decreased with each injection. ?-CT AP on 08/27/21 showed stable disease. I reviewed the images and discussed with her. Will repeat in 6 months. ?-last 24hr urine on 08/20/21 was normal. I discussed that we can spread these out to every 6 months. ?-labs reviewed, all WNL. She continues to do well on the Sandostatin injections. ?  ?2. Abdominal pain, flushing, leg  cramps ?-Secondary to #1, managed with tylenol ?-Given the multiple small bowel lesions and mesenteric metastasis, she is at high risk for bowel obstruction.  ?-We recommend low residue diet.  She has been seen by our dietician. ?-she reports several instances of flushing, and feeling like she's going to pass out.  ?-she also reports increase in leg cramps since starting the injections. I advised her to try magnesium or calcium supplement. ?  ?3. Back pain ?-She reported worsening chronic back pain, up to 10/10 pain. I prescribed tramadol on 03/02/21. I reviewed that she should take this only as needed. ?-she also has flexeril to use. ?-She is currently on 30 mg of Cymbalta for anxiety/depression. I previously discussed the interaction between cymbalta and tramadol and advised her not to take them at the same time. ?  ?4. CAD, HTN, pre-DM ?-On losartan-HCTZ and rosuvastatin ?-Follow-up with PCP and cardiology ?-I reviewed her high BP could be related to sandostatin injections. ?  ?  ?PLAN: ?-proceed with Sandostatin '30mg'$  today and every 4 weeks ?-f/u in 3 months with lab and CT several days before ? ? ?No problem-specific Assessment & Plan notes found for this encounter. ? ? ?SUMMARY OF ONCOLOGIC HISTORY: ?Oncology History Overview Note  ? Cancer Staging  ?Primary malignant neuroendocrine tumor of small intestine ([place]) ?Staging form: Gastrointestinal Stromal Tumor - Small Intestinal, Esophageal, Colorectal, Mesenteric, and Peritoneal GIST, AJCC 8th Edition ?- Clinical: Stage IV (cTX, cN0, pM1) - Signed by Alla Feeling, NP on 02/26/2021 ? ?  ?Primary malignant neuroendocrine tumor of small intestine ([place])  ?12/10/2020 Procedure  ? Upper endoscopy, Dr. Ronnette Juniper Impression ?- Normal upper third of esophagus,  middle third of esophagus and lower third of ?esophagus. ?- Z-line regular, 38 cm from the incisors. ?- Erythematous mucosa in the antrum. Biopsied. Clip (MR conditional) was placed. ?- Normal examined duodenum.  Biopsied. ?  ?12/10/2020 Pathology Results  ? FINAL MICROSCOPIC DIAGNOSIS:  ? ?A. DUODENUM, BIOPSY:  ?- Duodenal mucosa with no significant pathologic findings.  ?- Negative for increased intraepithelial lymphocytes and villous  ?architectural changes.  ? ?B. STOMACH, ANTRUM, BIOPSY:  ?- Reactive gastropathy.  ?- Warthin-Starry stain is negative for Helicobacter pylori.  ?  ?12/11/2020 Procedure  ? Colonoscopy, Dr. Ronnette Juniper impression ?- One 5 mm polyp in the transverse colon, removed piecemeal using a cold biopsy forceps. Resected and retrieved. ?- Diverticulosis in the sigmoid colon, in the descending colon and in the transverse colon. ?- The examined portion of the ileum was normal. ?- Non-bleeding internal hemorrhoids. ?  ?12/11/2020 Pathology Results  ? FINAL MICROSCOPIC DIAGNOSIS:  ? ?A. COLON, TRANSVERSE, POLYPECTOMY:  ?- Tubular adenoma.  ?- Negative for high grade dysplasia.  ?  ?12/24/2020 Procedure  ? Capsule endoscopy report: ?A polypoid nodular growth was noted at 3 hours and 30 minutes and an ulcerated, polypoid growth was noted at 3 hours and 54 minutes which is likely the cause of obscure GI blood loss ?  ?01/23/2021 Imaging  ? CT entero AP w contrast IMPRESSION: ?1. No acute findings identified within the abdomen or pelvis. ?2. There are several focal areas of intraluminal hyperenhancement ?within the small bowel loops. The largest is in the nondilated mid ?to distal jejunum measuring 1.3 cm. Cannot rule out underlying small ?bowel neoplasm. Correlation with tissue sampling results advised. ?Correlation with results from capsule endoscopy. ?3. There is a enhancing soft tissue nodule with central ?calcification within the central small bowel mesentery. This is ?nonspecific and may represent sequelae of inflammation/infection. ?Primary differential considerations include carcinoid tumor or ?metastatic adenopathy. If there is a clinical concern for carcinoid ?tumor consider further investigation with  DOTATATE PET. ?4. Left adrenal gland adenoma. ?5. Distal colonic diverticulosis without signs of acute ?diverticulitis. ?6. Aortic atherosclerosis. ?  ?02/16/2021 PET scan  ? IMPRESSION: ?1. Evidence of well differentiated small bowel neuroendocrine tumor ?with mesenteric metastasis. ?2. Approximately 7 discrete small bowel lesions with intense ?radiotracer activity. Lesion depicted on comparison CT enterography. ?3. Two intensely radiotracer avid mesenteric implants centrally ?within the small bowel mesentery. ?4. Single hepatic metastasis with a second potential hepatic ?metastasis versus peritoneal implant. ?5. No small bowel obstruction. ?6. No evidence of metastatic disease outside the abdomen pelvis. ?7. Patient may be a candidate for peptide receptor radiotherapy ?(Lu-177 DOTATATE) available at [place] imaging ?department [phone]). ?  ?02/26/2021 Initial Diagnosis  ? Primary malignant neuroendocrine tumor of small intestine ([place]) ?  ?02/26/2021 Cancer Staging  ? Staging form: Gastrointestinal Stromal Tumor - Small Intestinal, Esophageal, Colorectal, Mesenteric, and Peritoneal GIST, AJCC 8th Edition ?- Clinical: Stage IV (cTX, cN0, pM1) - Signed by Alla Feeling, NP on 02/26/2021 ? ?  ?08/27/2021 Imaging  ? EXAM: ?CT ABDOMEN AND PELVIS WITH CONTRAST ? ?IMPRESSION: ?1. No new or progressive interval findings. ?2. Stable appearance of the 17 mm nodule central mesenteric nodule seen to be hypermetabolic on recent PET-CT. The second mesenteric lesion identified on that study is not discernible today. ?3. Stable 1.5 cm subcapsular low-density lesion posterior right ?liver since PET-CT 02/16/2021. ?4. 2 small bowel nodules are identified on imaging today although 7 discrete hypermetabolic small bowel lesions were seen on previous PET-CT. ?5. Left  colonic diverticulosis without diverticulitis. ?6. Aortic Atherosclerosis (ICD10-I70.0). ?  ? ? ? ?INTERVAL HISTORY:  ?Yaslyn P Boeh is here for a follow  up of metastatic NET. She was last seen by me on 09/03/21. She presents to the clinic accompanied by a family member. ?She reports diarrhea and abdominal pain from the sandostatin injections. She notes she does

## 2021-11-27 ENCOUNTER — Telehealth: Payer: Self-pay | Admitting: Hematology

## 2021-11-27 NOTE — Telephone Encounter (Signed)
Left message with follow-up appointments per 3/16 los. ?

## 2021-12-01 LAB — CHROMOGRANIN A: Chromogranin A (ng/mL): 37.2 ng/mL (ref 0.0–101.8)

## 2021-12-24 ENCOUNTER — Inpatient Hospital Stay: Payer: Medicare Other | Attending: Hematology

## 2021-12-24 ENCOUNTER — Other Ambulatory Visit: Payer: Self-pay

## 2021-12-24 VITALS — BP 147/84 | HR 65 | Temp 98.5°F | Resp 18

## 2021-12-24 DIAGNOSIS — C7A8 Other malignant neuroendocrine tumors: Secondary | ICD-10-CM | POA: Diagnosis present

## 2021-12-24 DIAGNOSIS — C7B8 Other secondary neuroendocrine tumors: Secondary | ICD-10-CM | POA: Insufficient documentation

## 2021-12-24 MED ORDER — OCTREOTIDE ACETATE 30 MG IM KIT
30.0000 mg | PACK | Freq: Once | INTRAMUSCULAR | Status: AC
  Administered 2021-12-24: 30 mg via INTRAMUSCULAR
  Filled 2021-12-24: qty 1

## 2021-12-30 ENCOUNTER — Other Ambulatory Visit: Payer: Self-pay

## 2022-01-21 ENCOUNTER — Inpatient Hospital Stay: Payer: Medicare Other

## 2022-02-04 ENCOUNTER — Inpatient Hospital Stay: Payer: Medicare Other | Attending: Hematology

## 2022-02-04 ENCOUNTER — Other Ambulatory Visit: Payer: Self-pay

## 2022-02-04 VITALS — BP 144/96 | HR 76 | Temp 98.8°F | Resp 18

## 2022-02-04 DIAGNOSIS — C7A8 Other malignant neuroendocrine tumors: Secondary | ICD-10-CM | POA: Insufficient documentation

## 2022-02-04 DIAGNOSIS — C7B8 Other secondary neuroendocrine tumors: Secondary | ICD-10-CM | POA: Diagnosis present

## 2022-02-04 MED ORDER — OCTREOTIDE ACETATE 30 MG IM KIT
30.0000 mg | PACK | Freq: Once | INTRAMUSCULAR | Status: AC
  Administered 2022-02-04: 30 mg via INTRAMUSCULAR
  Filled 2022-02-04: qty 1

## 2022-02-11 ENCOUNTER — Telehealth: Payer: Self-pay | Admitting: Hematology

## 2022-02-11 ENCOUNTER — Other Ambulatory Visit: Payer: Self-pay

## 2022-02-11 NOTE — Telephone Encounter (Signed)
.  Called patient to schedule appointment per 6/1 inbasket, patient is aware of date and time.

## 2022-02-15 ENCOUNTER — Inpatient Hospital Stay: Payer: Medicare Other

## 2022-02-15 DIAGNOSIS — G893 Neoplasm related pain (acute) (chronic): Secondary | ICD-10-CM | POA: Insufficient documentation

## 2022-02-15 DIAGNOSIS — I2510 Atherosclerotic heart disease of native coronary artery without angina pectoris: Secondary | ICD-10-CM | POA: Diagnosis not present

## 2022-02-15 DIAGNOSIS — Z79899 Other long term (current) drug therapy: Secondary | ICD-10-CM | POA: Insufficient documentation

## 2022-02-15 DIAGNOSIS — C7A8 Other malignant neuroendocrine tumors: Secondary | ICD-10-CM | POA: Insufficient documentation

## 2022-02-15 DIAGNOSIS — I10 Essential (primary) hypertension: Secondary | ICD-10-CM | POA: Diagnosis not present

## 2022-02-15 DIAGNOSIS — R7303 Prediabetes: Secondary | ICD-10-CM | POA: Insufficient documentation

## 2022-02-15 DIAGNOSIS — C7B8 Other secondary neuroendocrine tumors: Secondary | ICD-10-CM | POA: Diagnosis present

## 2022-02-17 LAB — 5 HIAA, QUANTITATIVE, URINE, 24 HOUR
5-HIAA, Ur: 3.9 mg/L
5-HIAA,Quant.,24 Hr Urine: 3.9 mg/24 hr (ref 0.0–14.9)

## 2022-02-18 ENCOUNTER — Inpatient Hospital Stay: Payer: Medicare Other

## 2022-02-22 ENCOUNTER — Ambulatory Visit (HOSPITAL_COMMUNITY)
Admission: RE | Admit: 2022-02-22 | Discharge: 2022-02-22 | Disposition: A | Discharge status: Home / Self Care | Payer: Medicare Other | Source: Ambulatory Visit | Attending: Hematology | Admitting: Hematology

## 2022-02-22 DIAGNOSIS — C7A8 Other malignant neuroendocrine tumors: Secondary | ICD-10-CM | POA: Diagnosis present

## 2022-02-22 LAB — POCT I-STAT CREATININE: Creatinine, Ser: 0.8 mg/dL (ref 0.44–1.00)

## 2022-02-22 MED ORDER — IOHEXOL 300 MG/ML  SOLN
100.0000 mL | Freq: Once | INTRAMUSCULAR | Status: AC | PRN
  Administered 2022-02-22: 100 mL via INTRAVENOUS

## 2022-02-22 MED ORDER — SODIUM CHLORIDE (PF) 0.9 % IJ SOLN
INTRAMUSCULAR | Status: AC
  Filled 2022-02-22: qty 50

## 2022-02-24 ENCOUNTER — Inpatient Hospital Stay: Payer: Medicare Other | Attending: Hematology | Admitting: Hematology

## 2022-02-24 ENCOUNTER — Other Ambulatory Visit: Payer: Self-pay | Admitting: Lab

## 2022-02-24 ENCOUNTER — Other Ambulatory Visit: Payer: Self-pay

## 2022-02-24 ENCOUNTER — Inpatient Hospital Stay: Payer: Medicare Other

## 2022-02-24 ENCOUNTER — Encounter: Payer: Self-pay | Admitting: Hematology

## 2022-02-24 VITALS — BP 140/86 | HR 84 | Temp 98.8°F | Resp 17 | Ht 63.0 in | Wt 190.1 lb

## 2022-02-24 DIAGNOSIS — C7A8 Other malignant neuroendocrine tumors: Secondary | ICD-10-CM

## 2022-02-24 DIAGNOSIS — C7A019 Malignant carcinoid tumor of the small intestine, unspecified portion: Secondary | ICD-10-CM

## 2022-02-24 LAB — CBC WITH DIFFERENTIAL (CANCER CENTER ONLY)
Abs Immature Granulocytes: 0.01 10*3/uL (ref 0.00–0.07)
Basophils Absolute: 0 10*3/uL (ref 0.0–0.1)
Basophils Relative: 0 %
Eosinophils Absolute: 0.2 10*3/uL (ref 0.0–0.5)
Eosinophils Relative: 3 %
HCT: 43.4 % (ref 36.0–46.0)
Hemoglobin: 14.3 g/dL (ref 12.0–15.0)
Immature Granulocytes: 0 %
Lymphocytes Relative: 34 %
Lymphs Abs: 1.7 10*3/uL (ref 0.7–4.0)
MCH: 28.8 pg (ref 26.0–34.0)
MCHC: 32.9 g/dL (ref 30.0–36.0)
MCV: 87.5 fL (ref 80.0–100.0)
Monocytes Absolute: 0.3 10*3/uL (ref 0.1–1.0)
Monocytes Relative: 7 %
Neutro Abs: 2.7 10*3/uL (ref 1.7–7.7)
Neutrophils Relative %: 56 %
Platelet Count: 318 10*3/uL (ref 150–400)
RBC: 4.96 MIL/uL (ref 3.87–5.11)
RDW: 12.9 % (ref 11.5–15.5)
WBC Count: 5 10*3/uL (ref 4.0–10.5)
nRBC: 0 % (ref 0.0–0.2)

## 2022-02-24 LAB — CMP (CANCER CENTER ONLY)
ALT: 16 U/L (ref 0–44)
AST: 17 U/L (ref 15–41)
Albumin: 4.2 g/dL (ref 3.5–5.0)
Alkaline Phosphatase: 68 U/L (ref 38–126)
Anion gap: 7 (ref 5–15)
BUN: 12 mg/dL (ref 8–23)
CO2: 29 mmol/L (ref 22–32)
Calcium: 9.7 mg/dL (ref 8.9–10.3)
Chloride: 105 mmol/L (ref 98–111)
Creatinine: 0.96 mg/dL (ref 0.44–1.00)
GFR, Estimated: >60 mL/min (ref >60)
Glucose, Bld: 155 mg/dL — ABNORMAL HIGH (ref 70–99)
Potassium: 3.8 mmol/L (ref 3.5–5.1)
Sodium: 141 mmol/L (ref 135–145)
Total Bilirubin: 0.5 mg/dL (ref 0.3–1.2)
Total Protein: 7.5 g/dL (ref 6.5–8.1)

## 2022-02-24 LAB — FERRITIN: Ferritin: 31 ng/mL (ref 11–307)

## 2022-02-24 NOTE — Progress Notes (Signed)
[place]   Telephone:(336) [phone] Fax:(336) [phone]   Clinic Follow up Note   Patient Care Team: Katherina Mires, MD as PCP - General (Family Medicine) Sueanne Margarita, MD as PCP - Sleep Medicine (Cardiology) Belva Crome, MD as PCP - Cardiology (Cardiology) Pieter Partridge, DO as Consulting Physician (Neurology) Alla Feeling, NP as Nurse Practitioner (Nurse Practitioner) Truitt Merle, MD as Consulting Physician (Oncology) Jonnie Finner, RN (Inactive) as Oncology Nurse Navigator  Date of Service:  02/24/2022  CHIEF COMPLAINT: f/u of metastatic neuroendocrine tumor  CURRENT THERAPY:  Sandostatin, starting 03/12/21  ASSESSMENT & PLAN:  Tanya Walsh is a 66 y.o. female with   1. 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   -Liver biopsy was attempted on 03/06/21, but the hepatic lesion could not be localized with ultrasound.  -She began Sandostatin on 03/12/21.  -Discussion with Dr. Malissa Hippo on 04/08/21 determined that the risks of small bowel endoscopy and biopsy outweigh the benefit and the procedure was cancelled. -She tolerated first dose '20mg'$  Sandostatin injection well, but did have more abdominal cramps after second dose '30mg'$ . These have decreased with each injection. -CT AP on 02/22/22 showed overall stable disease. I reviewed the images and discussed the finding in person today with her. Will repeat scan in 6 months. -last 24hr urine on 02/15/22 was normal. Will obtain every 6-12 months. -labs reviewed, all WNL. She continues to do well on the Sandostatin injections.   2. Abdominal pain,  flushing, leg cramps -Secondary to #1, managed with tylenol -Given the multiple small bowel lesions and mesenteric metastasis, she is at high risk for bowel obstruction.  -We recommend low residue diet. I will connect her back with our dietician to discuss further.   3. Back pain -She reported worsening chronic back pain, up to 10/10 pain. I previously prescribed tramadol on 03/02/21. She also has flexeril to use. -She is currently on 30 mg of Cymbalta for anxiety/depression. I previously discussed the interaction between cymbalta and tramadol and advised her not to take them at the same time.   4. CAD, HTN, pre-DM -On losartan-HCTZ and rosuvastatin -Follow-up with PCP and cardiology, will continue to monitor BP on sandostatin     PLAN: -proceed with Sandostatin '30mg'$  next on 03/04/22 and continue every 4 weeks -lab and f/u in 12 weeks   No problem-specific Assessment & Plan notes found for this encounter.   SUMMARY OF ONCOLOGIC HISTORY: Oncology History Overview Note   Cancer Staging  Primary malignant neuroendocrine tumor of small intestine ([place]) Staging form: Gastrointestinal Stromal Tumor - Small Intestinal, Esophageal, Colorectal, Mesenteric, and Peritoneal GIST, AJCC 8th Edition - Clinical: Stage IV (cTX, cN0, pM1) - Signed by Alla Feeling, NP on 02/26/2021    Primary malignant neuroendocrine tumor of small intestine ([place])  12/10/2020 Procedure   Upper endoscopy, Dr. Ronnette Juniper Impression - Normal upper third of esophagus, middle third of esophagus and lower third of esophagus. - Z-line regular, 38 cm from the incisors. - Erythematous mucosa in the antrum. Biopsied. Clip (MR conditional) was placed. - Normal examined duodenum. Biopsied.   12/10/2020 Pathology Results   FINAL MICROSCOPIC DIAGNOSIS:  A. DUODENUM, BIOPSY:  - Duodenal mucosa with no significant pathologic findings.  - Negative for increased intraepithelial lymphocytes and villous  architectural changes.    B. STOMACH, ANTRUM, BIOPSY:  - Reactive gastropathy.  - Warthin-Starry stain is negative for Helicobacter pylori.    12/11/2020 Procedure   Colonoscopy, Dr. Ronnette Juniper impression - One 5 mm polyp in the transverse colon, removed piecemeal using a cold biopsy forceps. Resected and retrieved. - Diverticulosis in the sigmoid colon, in the descending colon and in the transverse colon. - The examined portion of the ileum was normal. - Non-bleeding internal hemorrhoids.   12/11/2020 Pathology Results   FINAL MICROSCOPIC DIAGNOSIS:   A. COLON, TRANSVERSE, POLYPECTOMY:  - Tubular adenoma.  - Negative for high grade dysplasia.    12/24/2020 Procedure   Capsule endoscopy report: A polypoid nodular growth was noted at 3 hours and 30 minutes and an ulcerated, polypoid growth was noted at 3 hours and 54 minutes which is likely the cause of obscure GI blood loss   01/23/2021 Imaging   CT entero AP w contrast IMPRESSION: 1. No acute findings identified within the abdomen or pelvis. 2. There are several focal areas of intraluminal hyperenhancement within the small bowel loops. The largest is in the nondilated mid to distal jejunum measuring 1.3 cm. Cannot rule out underlying small bowel neoplasm. Correlation with tissue sampling results advised. Correlation with results from capsule endoscopy. 3. There is a enhancing soft tissue nodule with central calcification within the central small bowel mesentery. This is nonspecific and may represent sequelae of inflammation/infection. Primary differential considerations include carcinoid tumor or metastatic adenopathy. If there is a clinical concern for carcinoid tumor consider further investigation with DOTATATE PET. 4. Left adrenal gland adenoma. 5. Distal colonic diverticulosis without signs of acute diverticulitis. 6. Aortic atherosclerosis.   02/16/2021 PET scan   IMPRESSION: 1. Evidence of well differentiated small bowel neuroendocrine  tumor with mesenteric metastasis. 2. Approximately 7 discrete small bowel lesions with intense radiotracer activity. Lesion depicted on comparison CT enterography. 3. Two intensely radiotracer avid mesenteric implants centrally within the small bowel mesentery. 4. Single hepatic metastasis with a second potential hepatic metastasis versus peritoneal implant. 5. No small bowel obstruction. 6. No evidence of metastatic disease outside the abdomen pelvis. 7. Patient may be a candidate for peptide receptor radiotherapy (Lu-177 DOTATATE) available at [facility] [phone]).   02/26/2021 Initial Diagnosis   Primary malignant neuroendocrine tumor of small intestine ([place])   02/26/2021 Cancer Staging   Staging form: Gastrointestinal Stromal Tumor - Small Intestinal, Esophageal, Colorectal, Mesenteric, and Peritoneal GIST, AJCC 8th Edition - Clinical: Stage IV (cTX, cN0, pM1) - Signed by Alla Feeling, NP on 02/26/2021   08/27/2021 Imaging   EXAM: CT ABDOMEN AND PELVIS WITH CONTRAST  IMPRESSION: 1. No new or progressive interval findings. 2. Stable appearance of the 17 mm nodule central mesenteric nodule seen to be hypermetabolic on recent PET-CT. The second mesenteric lesion identified on that study is not discernible today. 3. Stable 1.5 cm subcapsular low-density lesion posterior right liver since PET-CT 02/16/2021. 4. 2 small bowel nodules are identified on imaging today although 7 discrete hypermetabolic small bowel lesions were seen on previous PET-CT. 5. Left colonic diverticulosis without diverticulitis. 6. Aortic Atherosclerosis (ICD10-I70.0).   02/22/2022 Imaging   EXAM: CT ABDOMEN AND PELVIS WITH CONTRAST  IMPRESSION: 1. Mural soft tissue nodule of the small bowel in the midline ventral abdomen is unchanged, as is a small adjacent metastatic mesenteric nodule.  2. No evidence of new metastatic disease in the abdomen or pelvis. 3. Diverticulosis  without evidence of acute diverticulitis. 4. Coronary artery disease.      INTERVAL HISTORY:  Tanya Walsh is here for a follow up of metastatic NET. She was last seen by me on 11/26/21. She presents to the clinic alone. She reports continued back pain, causing her difficulty sleeping on her left side.  She also expressed concern with palpable nodule-type tissue near her rectum. She denies bleeding.    All other systems were reviewed with the patient and are negative.  MEDICAL HISTORY:  Past Medical History:  Diagnosis Date   Coronary artery disease    Depression    GI bleed 07/17/2015   Hypertension    Myocardial infarction [facility])    Nocturia 08/14/2019   Refusal of blood transfusions as patient is Jehovah's Witness     SURGICAL HISTORY: Past Surgical History:  Procedure Laterality Date   ABDOMINAL HYSTERECTOMY     BACK SURGERY     BIOPSY  12/10/2020   Procedure: BIOPSY;  Surgeon: Ronnette Juniper, MD;  Location: [facility] ENDOSCOPY;  Service: Gastroenterology;;   BIOPSY  12/11/2020   Procedure: BIOPSY;  Surgeon: Ronnette Juniper, MD;  Location: [facility] ENDOSCOPY;  Service: Gastroenterology;;   COLONOSCOPY WITH PROPOFOL N/A 12/11/2020   Procedure: COLONOSCOPY WITH PROPOFOL;  Surgeon: Ronnette Juniper, MD;  Location: [facility];  Service: Gastroenterology;  Laterality: N/A;   ESOPHAGOGASTRODUODENOSCOPY N/A 07/17/2015   Procedure: ESOPHAGOGASTRODUODENOSCOPY (EGD);  Surgeon: Wonda Horner, MD;  Location[facility] ENDOSCOPY;  Service: Endoscopy;  Laterality: N/A;   ESOPHAGOGASTRODUODENOSCOPY (EGD) WITH PROPOFOL N/A 12/10/2020   Procedure: ESOPHAGOGASTRODUODENOSCOPY (EGD) WITH PROPOFOL;  Surgeon: Ronnette Juniper, MD;  Location: [place];  Service: Gastroenterology;  Laterality: N/A;   HEMOSTASIS CLIP PLACEMENT  12/10/2020   Procedure: HEMOSTASIS CLIP PLACEMENT;  Surgeon: Ronnette Juniper, MD;  Location: [place];  Service: Gastroenterology;;    I have reviewed the social history and family history with the  patient and they are unchanged from previous note.  ALLERGIES:  has No Known Allergies.  MEDICATIONS:  Current Outpatient Medications  Medication Sig Dispense Refill   cyclobenzaprine (FLEXERIL) 5 MG tablet Take 1 tablet (5 mg total) by mouth 3 (three) times daily as needed for muscle spasms. 30 tablet 0   DULoxetine (CYMBALTA) 60 MG capsule Take 60 mg by mouth daily.     fluticasone (FLONASE) 50 MCG/ACT nasal spray Place 1 spray into both nostrils daily.     losartan-hydrochlorothiazide (HYZAAR) 50-12.5 MG tablet Take 1 tablet by mouth daily.     Multiple Vitamins-Minerals (MULTIVITAMIN ADULTS 50+) TABS Take 1 tablet by mouth daily.     nitroGLYCERIN (NITROSTAT) 0.4 MG SL tablet Place 1 tablet (0.4 mg total) under the tongue every 5 (five) minutes as needed for chest pain. 30 tablet 0   pantoprazole (PROTONIX) 40 MG tablet Take 40 mg by mouth daily.     rosuvastatin (CRESTOR) 20 MG tablet Take 20 mg by mouth daily.     traMADol (ULTRAM) 50 MG tablet Take 1 tablet (50 mg total) by mouth every 12 (twelve) hours as needed. 30 tablet 0   trolamine salicylate (ASPERCREME) 10 % cream Apply 1 application topically as needed for muscle pain.     No current facility-administered medications for this visit.    PHYSICAL EXAMINATION: ECOG PERFORMANCE STATUS: 0 - Asymptomatic  Vitals:   02/24/22 1102  BP: 140/86  Pulse: 84  Resp: 17  Temp: 98.8 F (37.1 C)  SpO2: 98%   Wt Readings from Last 3 Encounters:  02/24/22 190 lb 1.6 oz (86.2 kg)  11/26/21 191 lb 6.4 oz (86.8 kg)  11/23/21 191 lb (86.6 kg)     GENERAL:alert, no distress and comfortable SKIN: skin color normal, no rashes or significant lesions EYES: normal, Conjunctiva are pink and non-injected, sclera clear  NEURO: alert & oriented x 3 with fluent speech RECTAL, external only: internal hemorrhoids present  LABORATORY DATA:  I have reviewed the data as listed    Latest Ref Rng & Units 02/24/2022   10:26 AM 11/26/2021    10:52 AM 08/27/2021    8:01 AM  CBC  WBC 4.0 - 10.5 K/uL 5.0  4.1  4.2   Hemoglobin 12.0 - 15.0 g/dL 14.3  13.5  14.3   Hematocrit 36.0 - 46.0 % 43.4  40.6  43.6   Platelets 150 - 400 K/uL 318  273  310         Latest Ref Rng & Units 02/24/2022   10:26 AM 02/22/2022    3:13 PM 11/26/2021   10:52 AM  CMP  Glucose 70 - 99 mg/dL 155   160   BUN 8 - 23 mg/dL 12   12   Creatinine 0.44 - 1.00 mg/dL 0.96  0.80  0.92   Sodium 135 - 145 mmol/L 141   140   Potassium 3.5 - 5.1 mmol/L 3.8   4.1   Chloride 98 - 111 mmol/L 105   104   CO2 22 - 32 mmol/L 29   32   Calcium 8.9 - 10.3 mg/dL 9.7   9.4   Total Protein 6.5 - 8.1 g/dL 7.5   6.8   Total Bilirubin 0.3 - 1.2 mg/dL 0.5   0.4   Alkaline Phos 38 - 126 U/L 68   61   AST 15 - 41 U/L 17   16   ALT 0 - 44 U/L 16   14       RADIOGRAPHIC STUDIES: I have personally reviewed the radiological images as listed and agreed with the findings in the report. No results found.    No orders of the defined types were placed in this encounter.  All questions were answered. The patient knows to call the clinic with any problems, questions or concerns. No barriers to learning was detected. The total time spent in the appointment was 30 minutes.     Truitt Merle, MD 02/24/2022   I, Wilburn Mylar, am acting as scribe for Truitt Merle, MD.   I have reviewed the above documentation for accuracy and completeness, and I agree with the above.

## 2022-02-26 LAB — CHROMOGRANIN A: Chromogranin A (ng/mL): 34.5 ng/mL (ref 0.0–101.8)

## 2022-03-04 ENCOUNTER — Other Ambulatory Visit: Payer: Self-pay

## 2022-03-04 ENCOUNTER — Inpatient Hospital Stay: Payer: Medicare Other

## 2022-03-04 VITALS — BP 135/84 | HR 84 | Temp 98.4°F | Resp 17

## 2022-03-04 DIAGNOSIS — C7A8 Other malignant neuroendocrine tumors: Secondary | ICD-10-CM | POA: Diagnosis not present

## 2022-03-04 MED ORDER — OCTREOTIDE ACETATE 30 MG IM KIT
30.0000 mg | PACK | Freq: Once | INTRAMUSCULAR | Status: AC
  Administered 2022-03-04: 30 mg via INTRAMUSCULAR
  Filled 2022-03-04: qty 1

## 2022-03-04 NOTE — Patient Instructions (Signed)
Octreotide injection solution ?What is this medication? ?OCTREOTIDE (ok TREE oh tide) is used to reduce blood levels of growth hormone in patients with a condition called acromegaly. This medicine also reduces flushing and watery diarrhea caused by certain types of cancer. ?This medicine may be used for other purposes; ask your health care provider or pharmacist if you have questions. ?COMMON BRAND NAME(S): Bynfezia, Sandostatin ?What should I tell my care team before I take this medication? ?They need to know if you have any of these conditions: ?diabetes ?gallbladder disease ?kidney disease ?liver disease ?thyroid disease ?an unusual or allergic reaction to octreotide, other medicines, foods, dyes, or preservatives ?pregnant or trying to get pregnant ?breast-feeding ?How should I use this medication? ?This medication is injected under the skin or into a vein. It is usually given by your care team in a hospital or clinic setting. ?If you get this medication at home, you will be taught how to prepare and give it. Use exactly as directed. Take it as directed on the prescription label at the same time every day. Keep taking it unless your care team tells you to stop. ?Allow the injection solution to come to room temperature before use. Do not warm it artificially. ?It is important that you put your used needles and syringes in a special sharps container. Do not put them in a trash can. If you do not have a sharps container, call your pharmacist or care team to get one. ?Talk to your care team about the use of this medication in children. Special care may be needed. ?Overdosage: If you think you have taken too much of this medicine contact a poison control center or emergency room at once. ?NOTE: This medicine is only for you. Do not share this medicine with others. ?What if I miss a dose? ?If you miss a dose, take it as soon as you can. If it is almost time for your next dose, take only that dose. Do not take double  or extra doses. ?What may interact with this medication? ?bromocriptine ?certain medicines for blood pressure, heart disease, irregular heartbeat ?cyclosporine ?diuretics ?medicines for diabetes, including insulin ?quinidine ?This list may not describe all possible interactions. Give your health care provider a list of all the medicines, herbs, non-prescription drugs, or dietary supplements you use. Also tell them if you smoke, drink alcohol, or use illegal drugs. Some items may interact with your medicine. ?What should I watch for while using this medication? ?Visit your care team for regular checks on your progress. Tell your care team if your symptoms do not start to get better or if they get worse. ?To help reduce irritation at the injection site, use a different site for each injection and make sure the solution is at room temperature before use. ?This medication may cause decreases in blood sugar. Signs of low blood sugar include chills, cool, pale skin or cold sweats, drowsiness, extreme hunger, fast heartbeat, headache, nausea, nervousness or anxiety, shakiness, trembling, unsteadiness, tiredness, or weakness. Contact your care team right away if you experience any of these symptoms. ?This medication may increase blood sugar. The risk may be higher in patients who already have diabetes. Ask your care team what you can do to lower your risk of diabetes while taking this medication. ?You should make sure you get enough vitamin B12 while you are taking this medication. Discuss the foods you eat and the vitamins you take with your care team. ?What side effects may I notice from receiving   this medication? ?Side effects that you should report to your doctor or health care professional as soon as possible: ?allergic reactions like skin rash, itching or hives, swelling of the face, lips, or tongue ?fast, slow, or irregular heartbeat ?right upper belly pain ?severe stomach pain ?signs and symptoms of high blood sugar  such as being more thirsty or hungry or having to urinate more than normal. You may also feel very tired or have blurry vision. ?signs and symptoms of low blood sugar such as feeling anxious; confusion; dizziness; increased hunger; unusually weak or tired; increased sweating; shakiness; cold, clammy skin; irritable; headache; blurred vision; fast heartbeat; loss of consciousness ?unusually weak or tired ?Side effects that usually do not require medical attention (report to your doctor or health care professional if they continue or are bothersome): ?diarrhea ?dizziness ?gas ?headache ?nausea, vomiting ?pain, redness, or irritation at site where injected ?upset stomach ?This list may not describe all possible side effects. Call your doctor for medical advice about side effects. You may report side effects to FDA at 1-800-FDA-1088. ?Where should I keep my medication? ?Keep out of the reach of children and pets. ?Store in the refrigerator. Protect from light. Allow to come to room temperature naturally. Do not use artificial heat. If protected from light, the injection may be stored between 20 and 30 degrees C (70 and 86 degrees F) for 14 days. After the initial use, throw away any unused portion of a multiple dose vial after 14 days. Get rid of any unused portions of the ampules after use. ?To get rid of medications that are no longer needed or have expired: ?Take the medication to a medication take-back program. Ask your pharmacy or law enforcement to find a location. ?If you cannot return the medication, ask your pharmacist or care team how to get rid of the medication safely. ?NOTE: This sheet is a summary. It may not cover all possible information. If you have questions about this medicine, talk to your doctor, pharmacist, or health care provider. ?? 2023 [employer] Standard (2021-08-21 00:00:00) ? ?

## 2022-04-01 ENCOUNTER — Inpatient Hospital Stay: Payer: Medicare Other | Attending: Hematology

## 2022-04-01 ENCOUNTER — Other Ambulatory Visit: Payer: Self-pay

## 2022-04-01 VITALS — BP 148/80 | HR 87 | Temp 98.6°F | Resp 18

## 2022-04-01 DIAGNOSIS — C7A8 Other malignant neuroendocrine tumors: Secondary | ICD-10-CM | POA: Insufficient documentation

## 2022-04-01 DIAGNOSIS — C7B8 Other secondary neuroendocrine tumors: Secondary | ICD-10-CM | POA: Diagnosis present

## 2022-04-01 MED ORDER — OCTREOTIDE ACETATE 30 MG IM KIT
30.0000 mg | PACK | Freq: Once | INTRAMUSCULAR | Status: AC
  Administered 2022-04-01: 30 mg via INTRAMUSCULAR
  Filled 2022-04-01: qty 1

## 2022-04-01 NOTE — Patient Instructions (Signed)
Octreotide injection solution ?What is this medication? ?OCTREOTIDE (ok TREE oh tide) is used to reduce blood levels of growth hormone in patients with a condition called acromegaly. This medicine also reduces flushing and watery diarrhea caused by certain types of cancer. ?This medicine may be used for other purposes; ask your health care provider or pharmacist if you have questions. ?COMMON BRAND NAME(S): Bynfezia, Sandostatin ?What should I tell my care team before I take this medication? ?They need to know if you have any of these conditions: ?diabetes ?gallbladder disease ?kidney disease ?liver disease ?thyroid disease ?an unusual or allergic reaction to octreotide, other medicines, foods, dyes, or preservatives ?pregnant or trying to get pregnant ?breast-feeding ?How should I use this medication? ?This medication is injected under the skin or into a vein. It is usually given by your care team in a hospital or clinic setting. ?If you get this medication at home, you will be taught how to prepare and give it. Use exactly as directed. Take it as directed on the prescription label at the same time every day. Keep taking it unless your care team tells you to stop. ?Allow the injection solution to come to room temperature before use. Do not warm it artificially. ?It is important that you put your used needles and syringes in a special sharps container. Do not put them in a trash can. If you do not have a sharps container, call your pharmacist or care team to get one. ?Talk to your care team about the use of this medication in children. Special care may be needed. ?Overdosage: If you think you have taken too much of this medicine contact a poison control center or emergency room at once. ?NOTE: This medicine is only for you. Do not share this medicine with others. ?What if I miss a dose? ?If you miss a dose, take it as soon as you can. If it is almost time for your next dose, take only that dose. Do not take double  or extra doses. ?What may interact with this medication? ?bromocriptine ?certain medicines for blood pressure, heart disease, irregular heartbeat ?cyclosporine ?diuretics ?medicines for diabetes, including insulin ?quinidine ?This list may not describe all possible interactions. Give your health care provider a list of all the medicines, herbs, non-prescription drugs, or dietary supplements you use. Also tell them if you smoke, drink alcohol, or use illegal drugs. Some items may interact with your medicine. ?What should I watch for while using this medication? ?Visit your care team for regular checks on your progress. Tell your care team if your symptoms do not start to get better or if they get worse. ?To help reduce irritation at the injection site, use a different site for each injection and make sure the solution is at room temperature before use. ?This medication may cause decreases in blood sugar. Signs of low blood sugar include chills, cool, pale skin or cold sweats, drowsiness, extreme hunger, fast heartbeat, headache, nausea, nervousness or anxiety, shakiness, trembling, unsteadiness, tiredness, or weakness. Contact your care team right away if you experience any of these symptoms. ?This medication may increase blood sugar. The risk may be higher in patients who already have diabetes. Ask your care team what you can do to lower your risk of diabetes while taking this medication. ?You should make sure you get enough vitamin B12 while you are taking this medication. Discuss the foods you eat and the vitamins you take with your care team. ?What side effects may I notice from receiving   this medication? ?Side effects that you should report to your doctor or health care professional as soon as possible: ?allergic reactions like skin rash, itching or hives, swelling of the face, lips, or tongue ?fast, slow, or irregular heartbeat ?right upper belly pain ?severe stomach pain ?signs and symptoms of high blood sugar  such as being more thirsty or hungry or having to urinate more than normal. You may also feel very tired or have blurry vision. ?signs and symptoms of low blood sugar such as feeling anxious; confusion; dizziness; increased hunger; unusually weak or tired; increased sweating; shakiness; cold, clammy skin; irritable; headache; blurred vision; fast heartbeat; loss of consciousness ?unusually weak or tired ?Side effects that usually do not require medical attention (report to your doctor or health care professional if they continue or are bothersome): ?diarrhea ?dizziness ?gas ?headache ?nausea, vomiting ?pain, redness, or irritation at site where injected ?upset stomach ?This list may not describe all possible side effects. Call your doctor for medical advice about side effects. You may report side effects to FDA at 1-800-FDA-1088. ?Where should I keep my medication? ?Keep out of the reach of children and pets. ?Store in the refrigerator. Protect from light. Allow to come to room temperature naturally. Do not use artificial heat. If protected from light, the injection may be stored between 20 and 30 degrees C (70 and 86 degrees F) for 14 days. After the initial use, throw away any unused portion of a multiple dose vial after 14 days. Get rid of any unused portions of the ampules after use. ?To get rid of medications that are no longer needed or have expired: ?Take the medication to a medication take-back program. Ask your pharmacy or law enforcement to find a location. ?If you cannot return the medication, ask your pharmacist or care team how to get rid of the medication safely. ?NOTE: This sheet is a summary. It may not cover all possible information. If you have questions about this medicine, talk to your doctor, pharmacist, or health care provider. ?? 2023 [employer] Standard (2021-08-21 00:00:00) ? ?

## 2022-04-29 ENCOUNTER — Inpatient Hospital Stay: Payer: Medicare Other | Attending: Hematology

## 2022-04-29 ENCOUNTER — Other Ambulatory Visit: Payer: Self-pay

## 2022-04-29 VITALS — BP 144/92 | HR 76 | Temp 98.4°F | Resp 18

## 2022-04-29 DIAGNOSIS — C7B8 Other secondary neuroendocrine tumors: Secondary | ICD-10-CM | POA: Insufficient documentation

## 2022-04-29 DIAGNOSIS — C7A8 Other malignant neuroendocrine tumors: Secondary | ICD-10-CM | POA: Insufficient documentation

## 2022-04-29 MED ORDER — OCTREOTIDE ACETATE 30 MG IM KIT
30.0000 mg | PACK | Freq: Once | INTRAMUSCULAR | Status: AC
  Administered 2022-04-29: 30 mg via INTRAMUSCULAR
  Filled 2022-04-29: qty 1

## 2022-04-29 NOTE — Patient Instructions (Signed)
Octreotide Injection Solution What is this medication? OCTREOTIDE (ok TREE oh tide) treats high levels of growth hormone (acromegaly). It works by reducing the amount of growth hormone your body makes. This reduces symptoms and the risk of health problems caused by too much growth hormone, such as diabetes and heart disease. It may also be used to treat diarrhea caused by neuroendocrine tumors. It works by slowing down the release of serotonin from the tumor cells. This reduces the number of bowel movements you have. This medicine may be used for other purposes; ask your health care provider or pharmacist if you have questions. COMMON BRAND NAME(S): Bynfezia, Sandostatin What should I tell my care team before I take this medication? They need to know if you have any of these conditions: Diabetes Gallbladder disease Kidney disease Liver disease Thyroid disease An unusual or allergic reaction to octreotide, other medications, foods, dyes, or preservatives Pregnant or trying to get pregnant Breast-feeding How should I use this medication? This medication is injected under the skin or into a vein. It is usually given by your care team in a hospital or clinic setting. If you get this medication at home, you will be taught how to prepare and give it. Use exactly as directed. Take it as directed on the prescription label at the same time every day. Keep taking it unless your care team tells you to stop. Allow the injection solution to come to room temperature before use. Do not warm it artificially. It is important that you put your used needles and syringes in a special sharps container. Do not put them in a trash can. If you do not have a sharps container, call your pharmacist or care team to get one. Talk to your care team about the use of this medication in children. Special care may be needed. Overdosage: If you think you have taken too much of this medicine contact a poison control center or  emergency room at once. NOTE: This medicine is only for you. Do not share this medicine with others. What if I miss a dose? If you miss a dose, take it as soon as you can. If it is almost time for your next dose, take only that dose. Do not take double or extra doses. What may interact with this medication? Bromocriptine Certain medications for blood pressure, heart disease, irregular heartbeat Cyclosporine Diuretics Medications for diabetes, including insulin Quinidine This list may not describe all possible interactions. Give your health care provider a list of all the medicines, herbs, non-prescription drugs, or dietary supplements you use. Also tell them if you smoke, drink alcohol, or use illegal drugs. Some items may interact with your medicine. What should I watch for while using this medication? Visit your care team for regular checks on your progress. Tell your care team if your symptoms do not start to get better or if they get worse. To help reduce irritation at the injection site, use a different site for each injection and make sure the solution is at room temperature before use. This medication may cause decreases in blood sugar. Signs of low blood sugar include chills, cool, pale skin or cold sweats, drowsiness, extreme hunger, fast heartbeat, headache, nausea, nervousness or anxiety, shakiness, trembling, unsteadiness, tiredness, or weakness. Contact your care team right away if you experience any of these symptoms. This medication may increase blood sugar. The risk may be higher in patients who already have diabetes. Ask your care team what you can do to lower your   risk of diabetes while taking this medication. You should make sure you get enough vitamin B12 while you are taking this medication. Discuss the foods you eat and the vitamins you take with your care team. What side effects may I notice from receiving this medication? Side effects that you should report to your care  team as soon as possible: Allergic reactions--skin rash, itching, hives, swelling of the face, lips, tongue, or throat Gallbladder problems--severe stomach pain, nausea, vomiting, fever Heart rhythm changes--fast or irregular heartbeat, dizziness, feeling faint or lightheaded, chest pain, trouble breathing High blood sugar (hyperglycemia)--increased thirst or amount of urine, unusual weakness or fatigue, blurry vision Low blood sugar (hypoglycemia)--tremors or shaking, anxiety, sweating, cold or clammy skin, confusion, dizziness, rapid heartbeat Low thyroid levels (hypothyroidism)--unusual weakness or fatigue, increased sensitivity to cold, constipation, hair loss, dry skin, weight gain, feelings of depression Low vitamin B12 level--pain, tingling, or numbness in the hands or feet, muscle weakness, dizziness, confusion, trouble concentrating Pancreatitis--severe stomach pain that spreads to your back or gets worse after eating or when touched, fever, nausea, vomiting Side effects that usually do not require medical attention (report to your care team if they continue or are bothersome): Diarrhea Dizziness Gas Headache Pain, redness, or irritation at injection site Stomach pain This list may not describe all possible side effects. Call your doctor for medical advice about side effects. You may report side effects to FDA at 1-800-FDA-1088. Where should I keep my medication? Keep out of the reach of children and pets. Store in the refrigerator. Protect from light. Allow to come to room temperature naturally. Do not use artificial heat. If protected from light, the injection may be stored between 20 and 30 degrees C (70 and 86 degrees F) for 14 days. After the initial use, throw away any unused portion of a multiple dose vial after 14 days. Get rid of any unused portions of the ampules after use. To get rid of medications that are no longer needed or have expired: Take the medication to a medication  take-back program. Ask your pharmacy or law enforcement to find a location. If you cannot return the medication, ask your pharmacist or care team how to get rid of the medication safely. NOTE: This sheet is a summary. It may not cover all possible information. If you have questions about this medicine, talk to your doctor, pharmacist, or health care provider.  2023 [employer] Standard (2007-10-21 00:00:00)  

## 2022-05-26 ENCOUNTER — Other Ambulatory Visit: Payer: Self-pay

## 2022-05-26 DIAGNOSIS — C7A019 Malignant carcinoid tumor of the small intestine, unspecified portion: Secondary | ICD-10-CM

## 2022-05-26 DIAGNOSIS — C7A8 Other malignant neuroendocrine tumors: Secondary | ICD-10-CM

## 2022-05-27 ENCOUNTER — Inpatient Hospital Stay: Payer: Medicare Other

## 2022-05-27 ENCOUNTER — Other Ambulatory Visit: Payer: Self-pay

## 2022-05-27 ENCOUNTER — Inpatient Hospital Stay: Payer: Medicare Other | Attending: Hematology

## 2022-05-27 ENCOUNTER — Inpatient Hospital Stay (HOSPITAL_BASED_OUTPATIENT_CLINIC_OR_DEPARTMENT_OTHER): Payer: Medicare Other | Admitting: Hematology

## 2022-05-27 ENCOUNTER — Other Ambulatory Visit: Payer: Self-pay | Admitting: Hematology

## 2022-05-27 VITALS — BP 113/91 | HR 68 | Temp 98.4°F | Resp 18 | Ht 63.0 in | Wt 189.8 lb

## 2022-05-27 DIAGNOSIS — Z79899 Other long term (current) drug therapy: Secondary | ICD-10-CM | POA: Diagnosis not present

## 2022-05-27 DIAGNOSIS — C7A8 Other malignant neuroendocrine tumors: Secondary | ICD-10-CM

## 2022-05-27 DIAGNOSIS — C7A019 Malignant carcinoid tumor of the small intestine, unspecified portion: Secondary | ICD-10-CM

## 2022-05-27 DIAGNOSIS — C7B8 Other secondary neuroendocrine tumors: Secondary | ICD-10-CM | POA: Diagnosis present

## 2022-05-27 LAB — COMPREHENSIVE METABOLIC PANEL
ALT: 15 U/L (ref 0–44)
AST: 17 U/L (ref 15–41)
Albumin: 4.1 g/dL (ref 3.5–5.0)
Alkaline Phosphatase: 63 U/L (ref 38–126)
Anion gap: 5 (ref 5–15)
BUN: 10 mg/dL (ref 8–23)
CO2: 31 mmol/L (ref 22–32)
Calcium: 9.4 mg/dL (ref 8.9–10.3)
Chloride: 104 mmol/L (ref 98–111)
Creatinine, Ser: 0.91 mg/dL (ref 0.44–1.00)
GFR, Estimated: >60 mL/min (ref >60)
Glucose, Bld: 140 mg/dL — ABNORMAL HIGH (ref 70–99)
Potassium: 3.8 mmol/L (ref 3.5–5.1)
Sodium: 140 mmol/L (ref 135–145)
Total Bilirubin: 0.4 mg/dL (ref 0.3–1.2)
Total Protein: 7 g/dL (ref 6.5–8.1)

## 2022-05-27 LAB — CBC WITH DIFFERENTIAL/PLATELET
Abs Immature Granulocytes: 0 10*3/uL (ref 0.00–0.07)
Basophils Absolute: 0 10*3/uL (ref 0.0–0.1)
Basophils Relative: 1 %
Eosinophils Absolute: 0.2 10*3/uL (ref 0.0–0.5)
Eosinophils Relative: 5 %
HCT: 42.8 % (ref 36.0–46.0)
Hemoglobin: 14 g/dL (ref 12.0–15.0)
Immature Granulocytes: 0 %
Lymphocytes Relative: 43 %
Lymphs Abs: 1.9 10*3/uL (ref 0.7–4.0)
MCH: 28.7 pg (ref 26.0–34.0)
MCHC: 32.7 g/dL (ref 30.0–36.0)
MCV: 87.9 fL (ref 80.0–100.0)
Monocytes Absolute: 0.3 10*3/uL (ref 0.1–1.0)
Monocytes Relative: 7 %
Neutro Abs: 1.9 10*3/uL (ref 1.7–7.7)
Neutrophils Relative %: 44 %
Platelets: 300 10*3/uL (ref 150–400)
RBC: 4.87 MIL/uL (ref 3.87–5.11)
RDW: 12.7 % (ref 11.5–15.5)
WBC: 4.4 10*3/uL (ref 4.0–10.5)
nRBC: 0 % (ref 0.0–0.2)

## 2022-05-27 MED ORDER — OCTREOTIDE ACETATE 30 MG IM KIT
30.0000 mg | PACK | Freq: Once | INTRAMUSCULAR | Status: AC
  Administered 2022-05-27: 30 mg via INTRAMUSCULAR
  Filled 2022-05-27: qty 1

## 2022-05-27 NOTE — Progress Notes (Signed)
[facility]   [phone] Fax:([phone]) [phone]   Clinic Follow up Note   Patient Care Team: Katherina Mires, MD as PCP - General (Family Medicine) Sueanne Margarita, MD as PCP - Sleep Medicine (Cardiology) Belva Crome, MD as PCP - Cardiology (Cardiology) Pieter Partridge, DO as Consulting Physician (Neurology) Alla Feeling, NP as Nurse Practitioner (Nurse Practitioner) Truitt Merle, MD as Consulting Physician (Oncology) Jonnie Finner, RN (Inactive) as Oncology Nurse Navigator  Date of Service:  05/27/2022  CHIEF COMPLAINT: f/u of metastatic neuroendocrine tumor  CURRENT THERAPY:  Sandostatin injection, q28d, starting 03/12/21  ASSESSMENT & PLAN:  Tanya Walsh is a 66 y.o. female with   1. 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   -Liver biopsy was attempted on 03/06/21, but the hepatic lesion could not be localized with ultrasound.  -She began Sandostatin on 03/12/21.  -Discussion with Dr. Malissa Hippo on 04/08/21 determined that the risks of small bowel endoscopy and biopsy outweigh the benefit and the procedure was cancelled. -CT AP on 02/22/22 showed overall stable disease. Will repeat scan in 6 months. -last 24hr urine on 02/15/22 was normal. Will obtain every 6-12 months. -she reports some worsening left flank pain. I recommended repeat DOTATATE PET to rule out cancer recurrence or metastasis. I ordered today to be done in next two weeks. -labs reviewed, all WNL. She continues to do well on the Sandostatin injections, with some abdominal cramps and loose BM.   2. Fibromyalgia, Pain -she  has history of fibromyalgia, previously followed many years ago while living in [place] -today (05/27/22) she reports stable back pain, new left breast pain, and new and worsening left flank pain. She also notes some whole-body pains. -she currently takes tramadol, flexeril, and cymbalta. I encouraged her to discuss fibromyalgia treatment options with her PCP.     PLAN: -proceed with Sandostatin today and continue every 4 weeks -DOTATATE PET scan to be done in next 2 weeks -lab and f/u in 12 weeks   No problem-specific Assessment & Plan notes found for this encounter.   SUMMARY OF ONCOLOGIC HISTORY: Oncology History Overview Note   Cancer Staging  Primary malignant neuroendocrine tumor of small intestine ([place]) Staging form: Gastrointestinal Stromal Tumor - Small Intestinal, Esophageal, Colorectal, Mesenteric, and Peritoneal GIST, AJCC 8th Edition - Clinical: Stage IV (cTX, cN0, pM1) - Signed by Alla Feeling, NP on 02/26/2021    Primary malignant neuroendocrine tumor of small intestine ([place])  12/10/2020 Procedure   Upper endoscopy, Dr. Ronnette Juniper Impression - Normal upper third of esophagus, middle third of esophagus and lower third of esophagus. - Z-line regular, 38 cm from the incisors. - Erythematous mucosa in the antrum. Biopsied. Clip (MR conditional) was placed. - Normal examined duodenum. Biopsied.   12/10/2020 Pathology Results   FINAL MICROSCOPIC DIAGNOSIS:   A. DUODENUM, BIOPSY:  - Duodenal mucosa with no significant pathologic findings.  - Negative for increased intraepithelial lymphocytes and villous  architectural changes.   B. STOMACH, ANTRUM, BIOPSY:  - Reactive gastropathy.  - Warthin-Starry stain is negative for Helicobacter pylori.    12/11/2020 Procedure   Colonoscopy, Dr. Ronnette Juniper impression - One 5  mm polyp in the transverse colon, removed piecemeal using a cold biopsy forceps. Resected and retrieved. - Diverticulosis in the sigmoid colon, in the  descending colon and in the transverse colon. - The examined portion of the ileum was normal. - Non-bleeding internal hemorrhoids.   12/11/2020 Pathology Results   FINAL MICROSCOPIC DIAGNOSIS:   A. COLON, TRANSVERSE, POLYPECTOMY:  - Tubular adenoma.  - Negative for high grade dysplasia.    12/24/2020 Procedure   Capsule endoscopy report: A polypoid nodular growth was noted at 3 hours and 30 minutes and an ulcerated, polypoid growth was noted at 3 hours and 54 minutes which is likely the cause of obscure GI blood loss   01/23/2021 Imaging   CT entero AP w contrast IMPRESSION: 1. No acute findings identified within the abdomen or pelvis. 2. There are several focal areas of intraluminal hyperenhancement within the small bowel loops. The largest is in the nondilated mid to distal jejunum measuring 1.3 cm. Cannot rule out underlying small bowel neoplasm. Correlation with tissue sampling results advised. Correlation with results from capsule endoscopy. 3. There is a enhancing soft tissue nodule with central calcification within the central small bowel mesentery. This is nonspecific and may represent sequelae of inflammation/infection. Primary differential considerations include carcinoid tumor or metastatic adenopathy. If there is a clinical concern for carcinoid tumor consider further investigation with DOTATATE PET. 4. Left adrenal gland adenoma. 5. Distal colonic diverticulosis without signs of acute diverticulitis. 6. Aortic atherosclerosis.   02/16/2021 PET scan   IMPRESSION: 1. Evidence of well differentiated small bowel neuroendocrine tumor with mesenteric metastasis. 2. Approximately 7 discrete small bowel lesions with intense radiotracer activity. Lesion depicted on comparison CT enterography. 3. Two intensely radiotracer avid mesenteric implants centrally within the small bowel mesentery. 4. Single hepatic metastasis with a second potential hepatic metastasis versus  peritoneal implant. 5. No small bowel obstruction. 6. No evidence of metastatic disease outside the abdomen pelvis. 7. Patient may be a candidate for peptide receptor radiotherapy (Lu-177 DOTATATE) available at [facility] [phone]).   02/26/2021 Initial Diagnosis   Primary malignant neuroendocrine tumor of small intestine ([place])   02/26/2021 Cancer Staging   Staging form: Gastrointestinal Stromal Tumor - Small Intestinal, Esophageal, Colorectal, Mesenteric, and Peritoneal GIST, AJCC 8th Edition - Clinical: Stage IV (cTX, cN0, pM1) - Signed by Alla Feeling, NP on 02/26/2021   08/27/2021 Imaging   EXAM: CT ABDOMEN AND PELVIS WITH CONTRAST  IMPRESSION: 1. No new or progressive interval findings. 2. Stable appearance of the 17 mm nodule central mesenteric nodule seen to be hypermetabolic on recent PET-CT. The second mesenteric lesion identified on that study is not discernible today. 3. Stable 1.5 cm subcapsular low-density lesion posterior right liver since PET-CT 02/16/2021. 4. 2 small bowel nodules are identified on imaging today although 7 discrete hypermetabolic small bowel lesions were seen on previous PET-CT. 5. Left colonic diverticulosis without diverticulitis. 6. Aortic Atherosclerosis (ICD10-I70.0).   02/22/2022 Imaging   EXAM: CT ABDOMEN AND PELVIS WITH CONTRAST  IMPRESSION: 1. Mural soft tissue nodule of the small bowel in the midline ventral abdomen is unchanged, as is a small adjacent metastatic mesenteric nodule. 2. No evidence of new metastatic disease in the abdomen or pelvis. 3. Diverticulosis without evidence of acute diverticulitis. 4. Coronary artery disease.      INTERVAL HISTORY:  Tanya Walsh is here for a follow up of metastatic neuroendocrine tumor. She was last seen by me on 02/24/22. She presents to the clinic alone.  She reports she has had left flank pain for several weeks that has worsened lately, to the point  that she cannot lay on that side. She explains this is different from her abdominal and back pains. She also notes some urinary symptoms-- urgency, incomplete emptying, and occasional radiating pain; she denies burning.   All other systems were reviewed with the patient and are negative.  MEDICAL HISTORY:  Past Medical History:  Diagnosis Date   Coronary artery disease    Depression    GI bleed 07/17/2015   Hypertension    Myocardial infarction [facility])    Nocturia 08/14/2019   Refusal of blood transfusions as patient is Jehovah's Witness     SURGICAL HISTORY: Past Surgical History:  Procedure Laterality Date   ABDOMINAL HYSTERECTOMY     BACK SURGERY     BIOPSY  12/10/2020   Procedure: BIOPSY;  Surgeon: Ronnette Juniper, [place];  Location: [facility] ENDOSCOPY;  Service: Gastroenterology;;   BIOPSY  12/11/2020   Procedure: BIOPSY;  Surgeon: Ronnette Juniper, MD;  Location: [facility] ENDOSCOPY;  Service: Gastroenterology;;   COLONOSCOPY WITH PROPOFOL N/A 12/11/2020   Procedure: COLONOSCOPY WITH PROPOFOL;  Surgeon: Ronnette Juniper, MD;  Location: [place];  Service: Gastroenterology;  Laterality: N/A;   ESOPHAGOGASTRODUODENOSCOPY N/A 07/17/2015   Procedure: ESOPHAGOGASTRODUODENOSCOPY (EGD);  Surgeon: Wonda Horner, MD;  Location: [facility] ENDOSCOPY;  Service: Endoscopy;  Laterality: N/A;   ESOPHAGOGASTRODUODENOSCOPY (EGD) WITH PROPOFOL N/A 12/10/2020   Procedure: ESOPHAGOGASTRODUODENOSCOPY (EGD) WITH PROPOFOL;  Surgeon: Ronnette Juniper, MD;  Location: [place];  Service: Gastroenterology;  Laterality: N/A;   HEMOSTASIS CLIP PLACEMENT  12/10/2020   Procedure: HEMOSTASIS CLIP PLACEMENT;  Surgeon: Ronnette Juniper, MD;  Location: [place];  Service: Gastroenterology;;    I have reviewed the social history and family history with the patient and they are unchanged from previous note.  ALLERGIES:  has No Known Allergies.  MEDICATIONS:  Current Outpatient Medications  Medication Sig Dispense Refill   cyclobenzaprine (FLEXERIL)  5 MG tablet Take 1 tablet (5 mg total) by mouth 3 (three) times daily as needed for muscle spasms. 30 tablet 0   DULoxetine (CYMBALTA) 60 MG capsule Take 60 mg by mouth daily.     fluticasone (FLONASE) 50 MCG/ACT nasal spray Place 1 spray into both nostrils daily.     losartan-hydrochlorothiazide (HYZAAR) 50-12.5 MG tablet Take 1 tablet by mouth daily.     Multiple Vitamins-Minerals (MULTIVITAMIN ADULTS 50+) TABS Take 1 tablet by mouth daily.     nitroGLYCERIN (NITROSTAT) 0.4 MG SL tablet Place 1 tablet (0.4 mg total) under the tongue every 5 (five) minutes as needed for chest pain. 30 tablet 0   pantoprazole (PROTONIX) 40 MG tablet Take 40 mg by mouth daily.     rosuvastatin (CRESTOR) 20 MG tablet Take 20 mg by mouth daily.     traMADol (ULTRAM) 50 MG tablet Take 1 tablet (50 mg total) by mouth every 12 (twelve) hours as needed. 30 tablet 0   trolamine salicylate (ASPERCREME) 10 % cream Apply 1 application topically as needed for muscle pain.     No current facility-administered medications for this visit.    PHYSICAL EXAMINATION: ECOG PERFORMANCE STATUS: {CHL ONC ECOG PS:[id number]}  There were no vitals filed for this visit. Wt Readings from Last 3 Encounters:  02/24/22 190 lb 1.6 oz (86.2 kg)  11/26/21 191 lb 6.4 oz (86.8 kg)  11/23/21 191 lb (86.6 kg)     GENERAL:alert, no distress and comfortable SKIN: skin color, texture, turgor are normal, no  rashes or significant lesions EYES: normal, Conjunctiva are pink and non-injected, sclera clear  NECK: supple, thyroid normal size, non-tender, without nodularity LYMPH:  no palpable lymphadenopathy in the cervical, axillary  ABDOMEN:abdomen soft, non-tender and normal bowel sounds Musculoskeletal:no cyanosis of digits and no clubbing, (+) tenderness to left rib cage/chest wall, including deep under breast. NEURO: alert & oriented x 3 with fluent speech, no focal motor/sensory deficits BREAST: No palpable mass, nodules or adenopathy  bilaterally. Breast exam benign.   LABORATORY DATA:  I have reviewed the data as listed    Latest Ref Rng & Units 05/27/2022   10:48 AM 02/24/2022   10:26 AM 11/26/2021   10:52 AM  CBC  WBC 4.0 - 10.5 K/uL 4.4  5.0  4.1   Hemoglobin 12.0 - 15.0 g/dL 14.0  14.3  13.5   Hematocrit 36.0 - 46.0 % 42.8  43.4  40.6   Platelets 150 - 400 K/uL 300  318  273         Latest Ref Rng & Units 02/24/2022   10:26 AM 02/22/2022    3:13 PM 11/26/2021   10:52 AM  CMP  Glucose 70 - 99 mg/dL 155   160   BUN 8 - 23 mg/dL 12   12   Creatinine 0.44 - 1.00 mg/dL 0.96  0.80  0.92   Sodium 135 - 145 mmol/L 141   140   Potassium 3.5 - 5.1 mmol/L 3.8   4.1   Chloride 98 - 111 mmol/L 105   104   CO2 22 - 32 mmol/L 29   32   Calcium 8.9 - 10.3 mg/dL 9.7   9.4   Total Protein 6.5 - 8.1 g/dL 7.5   6.8   Total Bilirubin 0.3 - 1.2 mg/dL 0.5   0.4   Alkaline Phos 38 - 126 U/L 68   61   AST 15 - 41 U/L 17   16   ALT 0 - 44 U/L 16   14       RADIOGRAPHIC STUDIES: I have personally reviewed the radiological images as listed and agreed with the findings in the report. No results found.    No orders of the defined types were placed in this encounter.  All questions were answered. The patient knows to call the clinic with any problems, questions or concerns. No barriers to learning was detected. The total time spent in the appointment was {CHL ONC TIME VISIT - [id number]}.     Aurea Graff 05/27/2022   I, Wilburn Mylar, am acting as scribe for Truitt Merle, MD.   {Add scribe attestation statement}

## 2022-05-28 LAB — CHROMOGRANIN A: Chromogranin A (ng/mL): 34.7 ng/mL (ref 0.0–101.8)

## 2022-05-30 ENCOUNTER — Encounter: Payer: Self-pay | Admitting: Hematology

## 2022-06-01 ENCOUNTER — Other Ambulatory Visit: Payer: Self-pay

## 2022-06-01 DIAGNOSIS — C7A019 Malignant carcinoid tumor of the small intestine, unspecified portion: Secondary | ICD-10-CM

## 2022-06-01 DIAGNOSIS — C7A8 Other malignant neuroendocrine tumors: Secondary | ICD-10-CM

## 2022-06-24 ENCOUNTER — Other Ambulatory Visit: Payer: Self-pay

## 2022-06-24 ENCOUNTER — Inpatient Hospital Stay: Payer: Medicare Other | Attending: Hematology

## 2022-06-24 VITALS — BP 135/91 | HR 71 | Temp 99.1°F | Resp 18

## 2022-06-24 DIAGNOSIS — C7B8 Other secondary neuroendocrine tumors: Secondary | ICD-10-CM | POA: Insufficient documentation

## 2022-06-24 DIAGNOSIS — C7A8 Other malignant neuroendocrine tumors: Secondary | ICD-10-CM | POA: Diagnosis present

## 2022-06-24 MED ORDER — OCTREOTIDE ACETATE 30 MG IM KIT
30.0000 mg | PACK | Freq: Once | INTRAMUSCULAR | Status: AC
  Administered 2022-06-24: 30 mg via INTRAMUSCULAR
  Filled 2022-06-24: qty 1

## 2022-06-28 ENCOUNTER — Other Ambulatory Visit: Payer: Self-pay

## 2022-07-05 ENCOUNTER — Telehealth: Payer: Self-pay

## 2022-07-05 NOTE — Telephone Encounter (Signed)
Patient called stating her PCP is trying to prescribe her prednisone and just wants to know if it was okay with Dr. Burr Medico. Per Dr.Feng, it is okay for her to take prednisone. Patient verbalized understanding.

## 2022-07-13 NOTE — Progress Notes (Unsigned)
Cardiology Office Note:    Date:  07/14/2022   ID:  Tanya Walsh, Tanya Walsh 03-11-1956, MRN [id number]  PCP:  Katherina Mires, MD  [place] Providers Cardiologist:  Sinclair Grooms, MD Sleep Medicine:  Fransico Him, MD     Referring MD: Katherina Mires, MD   Chief Complaint:  Follow-up     History of Present Illness:   Tanya Walsh is a 66 y.o. female  with a hx of chest pain, hypertension, "history of myocardial infarction" with no verifying data available, and normal coronary angio 2021, OSA, HTN, morbid obesity.   Patient last saw Dr. Tamala Julian 11/2021 and BP's were elevated-was to monitor at home.    Patient comes in for f/u. She has been diagnosed with metastatic primary malignant neuroendocrine tumor of small intestines to mesentery and liver. On Sandostatin. She hasn't been checking her BP.  On prednisone dose pack for bone pain. Has occasional chest discomfort-pressure at rest or exertion-it moves around and hard for her to discern with her fibromyalgia. Lasts a few seconds.     Past Medical History:  Diagnosis Date   Coronary artery disease    Depression    GI bleed 07/17/2015   Hypertension    Myocardial infarction [facility])    Nocturia 08/14/2019   Refusal of blood transfusions as patient is Jehovah's Witness    Current Medications: Current Meds  Medication Sig   cyclobenzaprine (FLEXERIL) 5 MG tablet Take 1 tablet (5 mg total) by mouth 3 (three) times daily as needed for muscle spasms.   DULoxetine (CYMBALTA) 60 MG capsule Take 60 mg by mouth daily.   fluticasone (FLONASE) 50 MCG/ACT nasal spray Place 1 spray into both nostrils daily.   losartan-hydrochlorothiazide (HYZAAR) 50-12.5 MG tablet Take 1 tablet by mouth daily.   methylPREDNISolone (MEDROL DOSEPAK) 4 MG TBPK tablet Take 4 mg by mouth daily at 6 (six) AM. Pt take for six day.   Multiple Vitamins-Minerals (MULTIVITAMIN ADULTS 50+) TABS Take 1 tablet by mouth daily.   nitroGLYCERIN (NITROSTAT) 0.4 MG  SL tablet Place 1 tablet (0.4 mg total) under the tongue every 5 (five) minutes as needed for chest pain.   pantoprazole (PROTONIX) 40 MG tablet Take 40 mg by mouth daily.   rosuvastatin (CRESTOR) 20 MG tablet Take 20 mg by mouth daily.   traMADol (ULTRAM) 50 MG tablet Take 1 tablet (50 mg total) by mouth every 12 (twelve) hours as needed.   trolamine salicylate (ASPERCREME) 10 % cream Apply 1 application topically as needed for muscle pain.    Allergies:   Patient has no known allergies.   Social History   Tobacco Use   Smoking status: Never   Smokeless tobacco: Never  Vaping Use   Vaping Use: Never used  Substance Use Topics   Alcohol use: Yes    Comment: wine rarely   Drug use: No    Family Hx: The patient's family history includes Breast cancer in her niece; Cancer in her mother; Heart disease in her brother and father.  ROS     Physical Exam:    VS:  BP (!) 142/82   Pulse 68   Ht '5\' 3"'$  (1.6 m)   Wt 189 lb 12.8 oz (86.1 kg)   SpO2 98%   BMI 33.62 kg/m     Wt Readings from Last 3 Encounters:  07/14/22 189 lb 12.8 oz (86.1 kg)  05/27/22 189 lb 12.8 oz (86.1 kg)  02/24/22 190 lb 1.6 oz (  86.2 kg)    Physical Exam  GEN: Well nourished, well developed, in no acute distress  Neck: no JVD, carotid bruits, or masses Cardiac:RRR; no murmurs, rubs, or gallops  Respiratory:  clear to auscultation bilaterally, normal work of breathing GI: soft, nontender, nondistended, + BS Ext: without cyanosis, clubbing, or edema, Good distal pulses bilaterally Neuro:  Alert and Oriented x 3,  Psych: euthymic mood, full affect        EKGs/Labs/Other Test Reviewed:    EKG:  EKG is not ordered today.    Recent Labs: 05/27/2022: ALT 15; BUN 10; Creatinine, Ser 0.91; Hemoglobin 14.0; Platelets 300; Potassium 3.8; Sodium 140   Recent Lipid Panel No results for input(s): "CHOL", "TRIG", "HDL", "VLDL", "LDLCALC", "LDLDIRECT" in the last 8760 hours.   Prior CV Studies:     CT FFR  09/2019 FINDINGS: 1. Left Main:  No significant stenosis. FFR = 0.98   2. LAD: No significant stenosis. Proximal FFR = 0.91, Mid FFR = 0.84, Distal FFR = 0.80 3. LCX: No significant stenosis. Proximal FFR = 0.92, Distal FFR = 0.81 4. RCA: No significant stenosis. Proximal FFR = 0.99, Mid FFR = 0.88, Distal FFR = 0.85   IMPRESSION: 1.  CT FFR analysis did not show any significant stenosis.     Electronically Signed   By: Pixie Casino M.D.   On: 09/29/2019 13:15   CTA 09/28/19 IMPRESSION: 1. Moderate mixed CAD of the proximal RCA, proximal to mid LAD and ostial ramus intermedius , CADRADS = 3. CT FFR will be performed and reported separately.   2. Coronary calcium score of 594. This was 97th percentile for age and sex matched control.   3. Normal coronary origin with right dominance.     Electronically Signed   By: Pixie Casino M.D.   On: 09/28/2019 13:07  Risk Assessment/Calculations/Metrics:         HYPERTENSION CONTROL Vitals:   07/14/22 1341 07/14/22 1413  BP: (!) 141/78 (!) 142/82    The patient's blood pressure is elevated above target today.  In order to address the patient's elevated BP: Blood pressure will be monitored at home to determine if medication changes need to be made. (Patient on steroid dose pack. she'll monitor at home)       ASSESSMENT & PLAN:   No problem-specific Assessment & Plan notes found for this encounter.   Coronary artery calcification seen on CT scan -occasional fleeting chest pain at rest or with exertion. Nothing prolonged. Will hold off on further w/u unless symptoms progress. Will have her f/u with Dr. Radford Pax for general cardiology since she see for OSA.  Essential hypertension -BP borderline high on steroid dose pack. She'll monitor more closely at home  OSA (obstructive sleep apnea)  followed by Dr. Radford Pax  Metastatic neuroendocrine tumor small intestines on sandostatin. Some DOE. Will order echo          Dispo:   No follow-ups on file.   Medication Adjustments/Labs and Tests Ordered: Current medicines are reviewed at length with the patient today.  Concerns regarding medicines are outlined above.  Tests Ordered: Orders Placed This Encounter  Procedures   ECHOCARDIOGRAM COMPLETE   Medication Changes: No orders of the defined types were placed in this encounter.  Signed, Ermalinda Barrios, PA-C  07/14/2022 2:21 PM    [address], [place], [place]  [address] Phone: [phone]; Fax: [phone]

## 2022-07-14 ENCOUNTER — Ambulatory Visit: Payer: Medicare Other | Attending: Physician Assistant | Admitting: Physician Assistant

## 2022-07-14 ENCOUNTER — Encounter: Payer: Self-pay | Admitting: Physician Assistant

## 2022-07-14 VITALS — BP 142/82 | HR 68 | Ht 63.0 in | Wt 189.8 lb

## 2022-07-14 DIAGNOSIS — C7A8 Other malignant neuroendocrine tumors: Secondary | ICD-10-CM

## 2022-07-14 DIAGNOSIS — R0602 Shortness of breath: Secondary | ICD-10-CM

## 2022-07-14 DIAGNOSIS — I2510 Atherosclerotic heart disease of native coronary artery without angina pectoris: Secondary | ICD-10-CM | POA: Diagnosis not present

## 2022-07-14 DIAGNOSIS — G4733 Obstructive sleep apnea (adult) (pediatric): Secondary | ICD-10-CM | POA: Diagnosis not present

## 2022-07-14 DIAGNOSIS — I10 Essential (primary) hypertension: Secondary | ICD-10-CM | POA: Diagnosis not present

## 2022-07-14 NOTE — Patient Instructions (Signed)
Medication Instructions:  Your physician recommends that you continue on your current medications as directed. Please refer to the Current Medication list given to you today.  *If you need a refill on your cardiac medications before your next appointment, please call your pharmacy*   Lab Work: None If you have labs (blood work) drawn today and your tests are completely normal, you will receive your results only by: [place] (if you have MyChart) OR A paper copy in the mail If you have any lab test that is abnormal or we need to change your treatment, we will call you to review the results.   Testing/Procedures: Your physician has requested that you have an echocardiogram. Echocardiography is a painless test that uses sound waves to create images of your heart. It provides your doctor with information about the size and shape of your heart and how well your heart's chambers and valves are working. This procedure takes approximately one hour. There are no restrictions for this procedure. Please do NOT wear cologne, perfume, aftershave, or lotions (deodorant is allowed). Please arrive 15 minutes prior to your appointment time.   Follow-Up: At [facility], you and your health needs are our priority.  As part of our continuing mission to provide you with exceptional heart care, we have created designated Provider Care Teams.  These Care Teams include your primary Cardiologist (physician) and Advanced Practice Providers (APPs -  Physician Assistants and Nurse Practitioners) who all work together to provide you with the care you need, when you need it.  We recommend signing up for the patient portal called "MyChart".  Sign up information is provided on this After Visit Summary.  MyChart is used to connect with patients for Virtual Visits (Telemedicine).  Patients are able to view lab/test results, encounter notes, upcoming appointments, etc.  Non-urgent messages can be sent to your  provider as well.   To learn more about what you can do with MyChart, go to [email or web address].    Your next appointment:   1 year(s)  The format for your next appointment:   In Person  Provider:   Fransico Him, MD    Important Information About Sugar

## 2022-07-22 ENCOUNTER — Inpatient Hospital Stay: Payer: Medicare Other | Attending: Hematology

## 2022-07-22 VITALS — BP 138/85 | HR 79 | Temp 99.6°F | Resp 18

## 2022-07-22 DIAGNOSIS — C7B8 Other secondary neuroendocrine tumors: Secondary | ICD-10-CM | POA: Diagnosis present

## 2022-07-22 DIAGNOSIS — C7A8 Other malignant neuroendocrine tumors: Secondary | ICD-10-CM | POA: Diagnosis not present

## 2022-07-22 MED ORDER — OCTREOTIDE ACETATE 30 MG IM KIT
30.0000 mg | PACK | Freq: Once | INTRAMUSCULAR | Status: AC
  Administered 2022-07-22: 30 mg via INTRAMUSCULAR
  Filled 2022-07-22: qty 1

## 2022-07-28 ENCOUNTER — Ambulatory Visit (HOSPITAL_COMMUNITY): Payer: Medicare Other | Attending: Physician Assistant

## 2022-07-28 DIAGNOSIS — R0602 Shortness of breath: Secondary | ICD-10-CM

## 2022-07-28 LAB — ECHOCARDIOGRAM COMPLETE
Area-P 1/2: 3.42 cm2
S' Lateral: 1.6 cm

## 2022-08-17 NOTE — Progress Notes (Unsigned)
[facility]   Telephone:([phone]) [phone] Fax:(336) [phone]   Clinic Follow up Note   Patient Care Team: Katherina Mires, MD as PCP - General (Family Medicine) Sueanne Margarita, MD as PCP - Sleep Medicine (Cardiology) Belva Crome, MD as PCP - Cardiology (Cardiology) Pieter Partridge, DO as Consulting Physician (Neurology) Alla Feeling, NP as Nurse Practitioner (Nurse Practitioner) Truitt Merle, MD as Consulting Physician (Oncology) Jonnie Finner, RN (Inactive) as Oncology Nurse Navigator  Date of Service:  08/19/2022  CHIEF COMPLAINT: f/u of metastatic neuroendocrine tumor   CURRENT THERAPY:  Sandostatin injection, q28d, starting 03/12/21   ASSESSMENT:  Tanya Walsh is a 66 y.o. 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  Liver biopsy was attempted, but the liver lesion was not visible on ultrasound. -She started Sandostatin injection in June 2022.  Overall tolerating well. Will continue.  -Repeated CT scan in June 2023 showed stable disease. -she complains of persistent pain in left-sided frank area, also reports dyspnea on exertion in the past months.  I have previously ordered a CT scan to be done in September, he has not been scheduled.  She will call radiology department to schedule it as soon as possible.    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 Ronnette Juniper Impression - Normal upper third of esophagus, middle third of esophagus and lower third of esophagus. - Z-line regular, 38 cm from the incisors. - Erythematous mucosa in the antrum. Biopsied. Clip (MR conditional) was placed. - Normal examined duodenum. Biopsied.   12/10/2020 Pathology Results   FINAL MICROSCOPIC DIAGNOSIS:   A. DUODENUM, BIOPSY:  - Duodenal mucosa with no significant pathologic findings.  - Negative for increased intraepithelial lymphocytes and villous  architectural changes.   B. STOMACH, ANTRUM, BIOPSY:  - Reactive gastropathy.  - Warthin-Starry stain is negative for Helicobacter pylori.    12/11/2020 Procedure   Colonoscopy, Dr. Ronnette Juniper impression - One 5 mm polyp in the transverse colon, removed piecemeal using a cold biopsy forceps. Resected and retrieved. - Diverticulosis in the sigmoid colon, in the descending colon and in the transverse colon. - The examined portion of the ileum was normal. - Non-bleeding internal hemorrhoids.   12/11/2020 Pathology Results   FINAL MICROSCOPIC DIAGNOSIS:   A. COLON, TRANSVERSE, POLYPECTOMY:  - Tubular adenoma.  - Negative for high grade dysplasia.    12/24/2020 Procedure   Capsule endoscopy report: A polypoid nodular growth was noted at 3 hours and 30 minutes and an ulcerated, polypoid growth was noted at 3 hours and 54 minutes which is likely the cause of obscure GI blood loss   01/23/2021 Imaging   CT entero AP w contrast IMPRESSION: 1. No acute findings identified within  the abdomen or pelvis. 2. There are several focal areas of intraluminal hyperenhancement within the small bowel loops. The largest is in the nondilated mid to distal jejunum measuring 1.3 cm. Cannot rule out underlying small bowel neoplasm.  Correlation with tissue sampling results advised. Correlation with results from capsule endoscopy. 3. There is a enhancing soft tissue nodule with central calcification within the central small bowel mesentery. This is nonspecific and may represent sequelae of inflammation/infection. Primary differential considerations include carcinoid tumor or metastatic adenopathy. If there is a clinical concern for carcinoid tumor consider further investigation with DOTATATE PET. 4. Left adrenal gland adenoma. 5. Distal colonic diverticulosis without signs of acute diverticulitis. 6. Aortic atherosclerosis.   02/16/2021 PET scan   IMPRESSION: 1. Evidence of well differentiated small bowel neuroendocrine tumor with mesenteric metastasis. 2. Approximately 7 discrete small bowel lesions with intense radiotracer activity. Lesion depicted on comparison CT enterography. 3. Two intensely radiotracer avid mesenteric implants centrally within the small bowel mesentery. 4. Single hepatic metastasis with a second potential hepatic metastasis versus peritoneal implant. 5. No small bowel obstruction. 6. No evidence of metastatic disease outside the abdomen pelvis. 7. Patient may be a candidate for peptide receptor radiotherapy (Lu-[id number] DOTATATE) available at [facility] [phone]).   02/26/2021 Initial Diagnosis   Primary malignant neuroendocrine tumor of small intestine ([place])   02/26/2021 Cancer Staging   Staging form: Gastrointestinal Stromal Tumor - Small Intestinal, Esophageal, Colorectal, Mesenteric, and Peritoneal GIST, AJCC 8th Edition - Clinical: Stage IV (cTX, cN0, pM1) - Signed by Alla Feeling, NP on 02/26/2021   08/27/2021 Imaging   EXAM: CT ABDOMEN AND PELVIS WITH CONTRAST  IMPRESSION: 1. No new or progressive interval findings. 2. Stable appearance of the 17 mm nodule central mesenteric nodule seen to be hypermetabolic on recent PET-CT. The second mesenteric  lesion identified on that study is not discernible today. 3. Stable 1.5 cm subcapsular low-density lesion posterior right liver since PET-CT 02/16/2021. 4. 2 small bowel nodules are identified on imaging today although 7 discrete hypermetabolic small bowel lesions were seen on previous PET-CT. 5. Left colonic diverticulosis without diverticulitis. 6. Aortic Atherosclerosis (ICD10-I70.0).   02/22/2022 Imaging   EXAM: CT ABDOMEN AND PELVIS WITH CONTRAST  IMPRESSION: 1. Mural soft tissue nodule of the small bowel in the midline ventral abdomen is unchanged, as is a small adjacent metastatic mesenteric nodule. 2. No evidence of new metastatic disease in the abdomen or pelvis. 3. Diverticulosis without evidence of acute diverticulitis. 4. Coronary artery disease.      INTERVAL HISTORY:  Tanya Walsh is here for a follow up of metastatic neuroendocrine tumor  She was last seen by me on 05/27/2022 She presents to the clinic alone. Pt reports she has SOB for about month. She went to her cardiologists and everything was fine, but elevated heart rate. Pt states at night she get a lot of phlegm and also when eating as well. Pt reports having dome diarrhea.She experiencing hot flashes.     All other systems were reviewed with the patient and are negative.  MEDICAL HISTORY:  Past Medical History:  Diagnosis Date   Coronary artery disease    Depression    GI bleed 07/17/2015   Hypertension    Myocardial infarction [facility])    Nocturia 08/14/2019   Refusal of blood transfusions as patient is Jehovah's Witness     SURGICAL HISTORY: Past Surgical History:  Procedure Laterality Date   ABDOMINAL HYSTERECTOMY     BACK SURGERY  BIOPSY  12/10/2020   Procedure: BIOPSY;  Surgeon: Ronnette Juniper, MD;  Location: [facility] ENDOSCOPY;  Service: Gastroenterology;;   BIOPSY  12/11/2020   Procedure: BIOPSY;  Surgeon: Ronnette Juniper, MD;  Location: [facility];  Service: Gastroenterology;;   COLONOSCOPY WITH  PROPOFOL N/A 12/11/2020   Procedure: COLONOSCOPY WITH PROPOFOL;  Surgeon: Ronnette Juniper, MD;  Location: [place];  Service: Gastroenterology;  Laterality: N/A;   ESOPHAGOGASTRODUODENOSCOPY N/A 07/17/2015   Procedure: ESOPHAGOGASTRODUODENOSCOPY (EGD);  Surgeon: Wonda Horner, MD;  Location: [facility] ENDOSCOPY;  Service: Endoscopy;  Laterality: N/A;   ESOPHAGOGASTRODUODENOSCOPY (EGD) WITH PROPOFOL N/A 12/10/2020   Procedure: ESOPHAGOGASTRODUODENOSCOPY (EGD) WITH PROPOFOL;  Surgeon: Ronnette Juniper, MD;  Location: [facility];  Service: Gastroenterology;  Laterality: N/A;   HEMOSTASIS CLIP PLACEMENT  12/10/2020   Procedure: HEMOSTASIS CLIP PLACEMENT;  Surgeon: Ronnette Juniper, MD;  Location: [place];  Service: Gastroenterology;;    I have reviewed the social history and family history with the patient and they are unchanged from previous note.  ALLERGIES:  has No Known Allergies.  MEDICATIONS:  Current Outpatient Medications  Medication Sig Dispense Refill   cyclobenzaprine (FLEXERIL) 5 MG tablet Take 1 tablet (5 mg total) by mouth 3 (three) times daily as needed for muscle spasms. 30 tablet 0   DULoxetine (CYMBALTA) 60 MG capsule Take 60 mg by mouth daily.     fluticasone (FLONASE) 50 MCG/ACT nasal spray Place 1 spray into both nostrils daily.     losartan-hydrochlorothiazide (HYZAAR) 50-12.5 MG tablet Take 1 tablet by mouth daily.     methylPREDNISolone (MEDROL DOSEPAK) 4 MG TBPK tablet Take 4 mg by mouth daily at 6 (six) AM. Pt take for six day.     Multiple Vitamins-Minerals (MULTIVITAMIN ADULTS 50+) TABS Take 1 tablet by mouth daily.     nitroGLYCERIN (NITROSTAT) 0.4 MG SL tablet Place 1 tablet (0.4 mg total) under the tongue every 5 (five) minutes as needed for chest pain. 30 tablet 0   pantoprazole (PROTONIX) 40 MG tablet Take 40 mg by mouth daily.     rosuvastatin (CRESTOR) 20 MG tablet Take 20 mg by mouth daily.     traMADol (ULTRAM) 50 MG tablet Take 1 tablet (50 mg total) by mouth every 12  (twelve) hours as needed. 30 tablet 0   trolamine salicylate (ASPERCREME) 10 % cream Apply 1 application topically as needed for muscle pain.     No current facility-administered medications for this visit.    PHYSICAL EXAMINATION: ECOG PERFORMANCE STATUS: 1 - Symptomatic but completely ambulatory  Vitals:   08/19/22 1047  BP: (!) 142/91  Pulse: 83  Resp: 18  Temp: 98.2 F (36.8 C)  SpO2: 97%   Wt Readings from Last 3 Encounters:  08/19/22 196 lb 3.2 oz (89 kg)  07/14/22 189 lb 12.8 oz (86.1 kg)  05/27/22 189 lb 12.8 oz (86.1 kg)     GENERAL:alert, no distress and comfortable SKIN: skin color, texture, turgor are normal, no rashes or significant lesions EYES: normal, Conjunctiva are pink and non-injected, sclera clear NECK: supple, thyroid normal size, non-tender, without nodularity LYMPH:  no palpable lymphadenopathy in the cervical, axillary  LUNGS: clear to auscultation and percussion with normal breathing effort HEART: regular rate & rhythm and no murmurs and no lower extremity edema ABDOMEN:abdomen soft, non-tender and normal bowel sounds Musculoskeletal:no cyanosis of digits and no clubbing  Left leg potrude below the patella NEURO: alert & oriented x 3 with fluent speech, no focal motor/sensory deficits  LABORATORY DATA:  I have  reviewed the data as listed    Latest Ref Rng & Units 08/19/2022   11:08 AM 05/27/2022   10:48 AM 02/24/2022   10:26 AM  CBC  WBC 4.0 - 10.5 K/uL 5.6  4.4  5.0   Hemoglobin 12.0 - 15.0 g/dL 13.6  14.0  14.3   Hematocrit 36.0 - 46.0 % 41.0  42.8  43.4   Platelets 150 - 400 K/uL 319  300  318         Latest Ref Rng & Units 08/19/2022   11:08 AM 05/27/2022   10:48 AM 02/24/2022   10:26 AM  CMP  Glucose 70 - 99 mg/dL 130  140  155   BUN 8 - 23 mg/dL '13  10  12   '$ Creatinine 0.44 - 1.00 mg/dL 0.86  0.91  0.96   Sodium 135 - 145 mmol/L 141  140  141   Potassium 3.5 - 5.1 mmol/L 4.2  3.8  3.8   Chloride 98 - 111 mmol/L 105  104  105    CO2 22 - 32 mmol/L 32  31  29   Calcium 8.9 - 10.3 mg/dL 9.6  9.4  9.7   Total Protein 6.5 - 8.1 g/dL 6.8  7.0  7.5   Total Bilirubin 0.3 - 1.2 mg/dL 0.4  0.4  0.5   Alkaline Phos 38 - 126 U/L 66  63  68   AST 15 - 41 U/L '14  17  17   '$ ALT 0 - 44 U/L '17  15  16       '$ RADIOGRAPHIC STUDIES: I have personally reviewed the radiological images as listed and agreed with the findings in the report. No results found.    No orders of the defined types were placed in this encounter.  All questions were answered. The patient knows to call the clinic with any problems, questions or concerns. No barriers to learning was detected. The total time spent in the appointment was 30 minutes.     Truitt Merle, MD 08/19/2022   Felicity Coyer, CMA, am acting as scribe for Truitt Merle, MD.   I have reviewed the above documentation for accuracy and completeness, and I agree with the above.

## 2022-08-19 ENCOUNTER — Inpatient Hospital Stay: Payer: Medicare Other

## 2022-08-19 ENCOUNTER — Encounter: Payer: Self-pay | Admitting: Hematology

## 2022-08-19 ENCOUNTER — Inpatient Hospital Stay: Payer: Medicare Other | Attending: Hematology | Admitting: Hematology

## 2022-08-19 ENCOUNTER — Other Ambulatory Visit: Payer: Self-pay

## 2022-08-19 VITALS — BP 142/91 | HR 83 | Temp 98.2°F | Resp 18 | Ht 63.0 in | Wt 196.2 lb

## 2022-08-19 DIAGNOSIS — C7B8 Other secondary neuroendocrine tumors: Secondary | ICD-10-CM | POA: Insufficient documentation

## 2022-08-19 DIAGNOSIS — C7A8 Other malignant neuroendocrine tumors: Secondary | ICD-10-CM

## 2022-08-19 DIAGNOSIS — C7A019 Malignant carcinoid tumor of the small intestine, unspecified portion: Secondary | ICD-10-CM

## 2022-08-19 DIAGNOSIS — Z79899 Other long term (current) drug therapy: Secondary | ICD-10-CM | POA: Insufficient documentation

## 2022-08-19 LAB — CBC WITH DIFFERENTIAL/PLATELET
Abs Immature Granulocytes: 0.02 10*3/uL (ref 0.00–0.07)
Basophils Absolute: 0 10*3/uL (ref 0.0–0.1)
Basophils Relative: 0 %
Eosinophils Absolute: 0.1 10*3/uL (ref 0.0–0.5)
Eosinophils Relative: 2 %
HCT: 41 % (ref 36.0–46.0)
Hemoglobin: 13.6 g/dL (ref 12.0–15.0)
Immature Granulocytes: 0 %
Lymphocytes Relative: 23 %
Lymphs Abs: 1.3 10*3/uL (ref 0.7–4.0)
MCH: 29.6 pg (ref 26.0–34.0)
MCHC: 33.2 g/dL (ref 30.0–36.0)
MCV: 89.1 fL (ref 80.0–100.0)
Monocytes Absolute: 0.4 10*3/uL (ref 0.1–1.0)
Monocytes Relative: 7 %
Neutro Abs: 3.8 10*3/uL (ref 1.7–7.7)
Neutrophils Relative %: 68 %
Platelets: 319 10*3/uL (ref 150–400)
RBC: 4.6 MIL/uL (ref 3.87–5.11)
RDW: 13.4 % (ref 11.5–15.5)
WBC: 5.6 10*3/uL (ref 4.0–10.5)
nRBC: 0 % (ref 0.0–0.2)

## 2022-08-19 LAB — COMPREHENSIVE METABOLIC PANEL
ALT: 17 U/L (ref 0–44)
AST: 14 U/L — ABNORMAL LOW (ref 15–41)
Albumin: 4 g/dL (ref 3.5–5.0)
Alkaline Phosphatase: 66 U/L (ref 38–126)
Anion gap: 4 — ABNORMAL LOW (ref 5–15)
BUN: 13 mg/dL (ref 8–23)
CO2: 32 mmol/L (ref 22–32)
Calcium: 9.6 mg/dL (ref 8.9–10.3)
Chloride: 105 mmol/L (ref 98–111)
Creatinine, Ser: 0.86 mg/dL (ref 0.44–1.00)
GFR, Estimated: >60 mL/min (ref >60)
Glucose, Bld: 130 mg/dL — ABNORMAL HIGH (ref 70–99)
Potassium: 4.2 mmol/L (ref 3.5–5.1)
Sodium: 141 mmol/L (ref 135–145)
Total Bilirubin: 0.4 mg/dL (ref 0.3–1.2)
Total Protein: 6.8 g/dL (ref 6.5–8.1)

## 2022-08-19 MED ORDER — OCTREOTIDE ACETATE 30 MG IM KIT
30.0000 mg | PACK | Freq: Once | INTRAMUSCULAR | Status: AC
  Administered 2022-08-19: 30 mg via INTRAMUSCULAR
  Filled 2022-08-19: qty 1

## 2022-08-19 NOTE — Assessment & Plan Note (Signed)
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  Liver biopsy was attempted, but the liver lesion was not visible on ultrasound. -She started Sandostatin injection in June 2022.  Overall tolerating well. Will continue.  -Repeated CT scan in June 2023 showed stable disease.

## 2022-08-20 ENCOUNTER — Telehealth: Payer: Self-pay | Admitting: Hematology

## 2022-08-20 NOTE — Telephone Encounter (Signed)
Called patient to schedule appointments. Left voicemail with appointment information.

## 2022-08-23 DIAGNOSIS — C7A8 Other malignant neuroendocrine tumors: Secondary | ICD-10-CM | POA: Diagnosis not present

## 2022-08-23 LAB — CHROMOGRANIN A: Chromogranin A (ng/mL): 28.4 ng/mL (ref 0.0–101.8)

## 2022-08-25 LAB — 5 HIAA, QUANTITATIVE, URINE, 24 HOUR
5-HIAA, Ur: 1.7 mg/L
5-HIAA,Quant.,24 Hr Urine: 3.6 mg/24 hr (ref 0.0–14.9)
Total Volume: 2100

## 2022-08-29 ENCOUNTER — Other Ambulatory Visit: Payer: Self-pay

## 2022-08-29 ENCOUNTER — Emergency Department (HOSPITAL_COMMUNITY): Payer: Medicare Other

## 2022-08-29 ENCOUNTER — Emergency Department (HOSPITAL_COMMUNITY)
Admission: EM | Admit: 2022-08-29 | Discharge: 2022-08-30 | Disposition: A | Discharge status: Home / Self Care | Payer: Medicare Other | Attending: Emergency Medicine | Admitting: Emergency Medicine

## 2022-08-29 DIAGNOSIS — R000 Tachycardia, unspecified: Secondary | ICD-10-CM | POA: Insufficient documentation

## 2022-08-29 DIAGNOSIS — I6523 Occlusion and stenosis of bilateral carotid arteries: Secondary | ICD-10-CM | POA: Diagnosis not present

## 2022-08-29 DIAGNOSIS — I2510 Atherosclerotic heart disease of native coronary artery without angina pectoris: Secondary | ICD-10-CM | POA: Diagnosis not present

## 2022-08-29 DIAGNOSIS — E119 Type 2 diabetes mellitus without complications: Secondary | ICD-10-CM | POA: Insufficient documentation

## 2022-08-29 DIAGNOSIS — R202 Paresthesia of skin: Secondary | ICD-10-CM

## 2022-08-29 DIAGNOSIS — I671 Cerebral aneurysm, nonruptured: Secondary | ICD-10-CM

## 2022-08-29 DIAGNOSIS — Z79899 Other long term (current) drug therapy: Secondary | ICD-10-CM | POA: Insufficient documentation

## 2022-08-29 DIAGNOSIS — I10 Essential (primary) hypertension: Secondary | ICD-10-CM | POA: Diagnosis not present

## 2022-08-29 DIAGNOSIS — R200 Anesthesia of skin: Secondary | ICD-10-CM | POA: Diagnosis present

## 2022-08-29 DIAGNOSIS — R531 Weakness: Secondary | ICD-10-CM | POA: Insufficient documentation

## 2022-08-29 LAB — CBC
HCT: 40.9 % (ref 36.0–46.0)
Hemoglobin: 13.6 g/dL (ref 12.0–15.0)
MCH: 29.4 pg (ref 26.0–34.0)
MCHC: 33.3 g/dL (ref 30.0–36.0)
MCV: 88.3 fL (ref 80.0–100.0)
Platelets: 340 10*3/uL (ref 150–400)
RBC: 4.63 MIL/uL (ref 3.87–5.11)
RDW: 13.4 % (ref 11.5–15.5)
WBC: 6.3 10*3/uL (ref 4.0–10.5)
nRBC: 0 % (ref 0.0–0.2)

## 2022-08-29 LAB — URINALYSIS, ROUTINE W REFLEX MICROSCOPIC
Bilirubin Urine: NEGATIVE
Glucose, UA: 50 mg/dL — AB
Hgb urine dipstick: NEGATIVE
Ketones, ur: NEGATIVE mg/dL
Leukocytes,Ua: NEGATIVE
Nitrite: NEGATIVE
Protein, ur: NEGATIVE mg/dL
Specific Gravity, Urine: 1.015 (ref 1.005–1.030)
pH: 6 (ref 5.0–8.0)

## 2022-08-29 LAB — DIFFERENTIAL
Abs Immature Granulocytes: 0.02 10*3/uL (ref 0.00–0.07)
Basophils Absolute: 0 10*3/uL (ref 0.0–0.1)
Basophils Relative: 1 %
Eosinophils Absolute: 0.2 10*3/uL (ref 0.0–0.5)
Eosinophils Relative: 3 %
Immature Granulocytes: 0 %
Lymphocytes Relative: 27 %
Lymphs Abs: 1.7 10*3/uL (ref 0.7–4.0)
Monocytes Absolute: 0.5 10*3/uL (ref 0.1–1.0)
Monocytes Relative: 8 %
Neutro Abs: 3.9 10*3/uL (ref 1.7–7.7)
Neutrophils Relative %: 61 %

## 2022-08-29 LAB — I-STAT CHEM 8, ED
BUN: 16 mg/dL (ref 8–23)
Calcium, Ion: 1.07 mmol/L — ABNORMAL LOW (ref 1.15–1.40)
Chloride: 98 mmol/L (ref 98–111)
Creatinine, Ser: 0.8 mg/dL (ref 0.44–1.00)
Glucose, Bld: 165 mg/dL — ABNORMAL HIGH (ref 70–99)
HCT: 43 % (ref 36.0–46.0)
Hemoglobin: 14.6 g/dL (ref 12.0–15.0)
Potassium: 4.8 mmol/L (ref 3.5–5.1)
Sodium: 135 mmol/L (ref 135–145)
TCO2: 27 mmol/L (ref 22–32)

## 2022-08-29 LAB — RAPID URINE DRUG SCREEN, HOSP PERFORMED
Amphetamines: NOT DETECTED
Barbiturates: NOT DETECTED
Benzodiazepines: NOT DETECTED
Cocaine: NOT DETECTED
Opiates: NOT DETECTED
Tetrahydrocannabinol: NOT DETECTED

## 2022-08-29 LAB — PROTIME-INR
INR: 1 (ref 0.8–1.2)
Prothrombin Time: 13.3 seconds (ref 11.4–15.2)

## 2022-08-29 LAB — APTT: aPTT: 27 seconds (ref 24–36)

## 2022-08-29 MED ORDER — LORAZEPAM 2 MG/ML IJ SOLN
0.5000 mg | Freq: Once | INTRAMUSCULAR | Status: AC
  Administered 2022-08-29: 0.5 mg via INTRAVENOUS
  Filled 2022-08-29: qty 1

## 2022-08-29 MED ORDER — MECLIZINE HCL 25 MG PO TABS
25.0000 mg | ORAL_TABLET | Freq: Once | ORAL | Status: AC
  Administered 2022-08-29: 25 mg via ORAL
  Filled 2022-08-29: qty 1

## 2022-08-29 MED ORDER — SODIUM CHLORIDE 0.9 % IV BOLUS
1000.0000 mL | Freq: Once | INTRAVENOUS | Status: AC
  Administered 2022-08-30: 1000 mL via INTRAVENOUS

## 2022-08-29 MED ORDER — IOHEXOL 350 MG/ML SOLN
75.0000 mL | Freq: Once | INTRAVENOUS | Status: AC | PRN
  Administered 2022-08-29: 75 mL via INTRAVENOUS

## 2022-08-29 MED ORDER — KETOROLAC TROMETHAMINE 30 MG/ML IJ SOLN
30.0000 mg | Freq: Once | INTRAMUSCULAR | Status: AC
  Administered 2022-08-30: 30 mg via INTRAVENOUS
  Filled 2022-08-29: qty 1

## 2022-08-29 MED ORDER — ACETAMINOPHEN 500 MG PO TABS
1000.0000 mg | ORAL_TABLET | Freq: Once | ORAL | Status: AC
  Administered 2022-08-29: 1000 mg via ORAL
  Filled 2022-08-29: qty 2

## 2022-08-29 NOTE — ED Provider Notes (Signed)
[facility] EMERGENCY DEPARTMENT Provider Note   CSN: [id number] Arrival date & time: 08/29/22  1948     History  Chief Complaint  Patient presents with   Facial Tingling / Dizzy    Tanya Walsh is a 66 y.o. female.  Pt is a 66 yo female with a pmhx significant for htn, hld, dm, cad, gi bleed, and depression.  Pt said she took a nap around 2 or 3 pm.  When she woke up (unsure of time), she had right leg weakness and left sided facial numbness.  She also felt dizzy.  She is starting to feel better, but still has some right leg weakness.        Home Medications Prior to Admission medications   Medication Sig Start Date End Date Taking? Authorizing Provider  cyclobenzaprine (FLEXERIL) 5 MG tablet Take 1 tablet (5 mg total) by mouth 3 (three) times daily as needed for muscle spasms. 12/11/20   Kathie Dike, MD  DULoxetine (CYMBALTA) 60 MG capsule Take 60 mg by mouth daily.    [provider]  fluticasone (FLONASE) 50 MCG/ACT nasal spray Place 1 spray into both nostrils daily. 11/11/21   [provider]  losartan-hydrochlorothiazide (HYZAAR) 50-12.5 MG tablet Take 1 tablet by mouth daily. 03/21/18   [provider]  methylPREDNISolone (MEDROL DOSEPAK) 4 MG TBPK tablet Take 4 mg by mouth daily at 6 (six) AM. Pt take for six day. 07/05/22   [provider]  Multiple Vitamins-Minerals (MULTIVITAMIN ADULTS 50+) TABS Take 1 tablet by mouth daily.    [provider]  nitroGLYCERIN (NITROSTAT) 0.4 MG SL tablet Place 1 tablet (0.4 mg total) under the tongue every 5 (five) minutes as needed for chest pain. 5/64/33   Delora Fuel, MD  pantoprazole (PROTONIX) 40 MG tablet Take 40 mg by mouth daily.    [provider]  rosuvastatin (CRESTOR) 20 MG tablet Take 20 mg by mouth daily.    [provider]  traMADol (ULTRAM) 50 MG tablet Take 1 tablet (50 mg total) by mouth every 12 (twelve) hours as needed. 03/12/21    Truitt Merle, MD  trolamine salicylate (ASPERCREME) 10 % cream Apply 1 application topically as needed for muscle pain.    [provider]      Allergies    Patient has no known allergies.    Review of Systems   Review of Systems  Neurological:  Positive for dizziness, weakness and numbness.  All other systems reviewed and are negative.   Physical Exam Updated Vital Signs BP 130/82 (BP Location: Left Arm)   Pulse (!) 57   Temp 98.5 F (36.9 C) (Oral)   Resp 18   Ht '5\' 3"'$  (1.6 m)   Wt 85.7 kg   SpO2 98%   BMI 33.48 kg/m  Physical Exam Vitals and nursing note reviewed.  Constitutional:      Appearance: Normal appearance.  HENT:     Head: Normocephalic and atraumatic.     Right Ear: External ear normal.     Left Ear: External ear normal.     Nose: Nose normal.     Mouth/Throat:     Mouth: Mucous membranes are moist.     Pharynx: Oropharynx is clear.  Eyes:     Extraocular Movements: Extraocular movements intact.     Conjunctiva/sclera: Conjunctivae normal.     Pupils: Pupils are equal, round, and reactive to light.  Cardiovascular:     Rate and Rhythm: Normal rate  and regular rhythm.     Pulses: Normal pulses.     Heart sounds: Normal heart sounds.  Pulmonary:     Effort: Pulmonary effort is normal.     Breath sounds: Normal breath sounds.  Abdominal:     General: Abdomen is flat. Bowel sounds are normal.     Palpations: Abdomen is soft.  Musculoskeletal:        General: Normal range of motion.     Cervical back: Normal range of motion and neck supple.  Skin:    General: Skin is warm.     Capillary Refill: Capillary refill takes less than 2 seconds.  Neurological:     Mental Status: She is alert and oriented to person, place, and time.     Comments: Mild right leg weakness.  No visual or speech disturbances.  Psychiatric:        Mood and Affect: Mood normal.        Behavior: Behavior normal.     ED Results / Procedures / Treatments   Labs (all  labs ordered are listed, but only abnormal results are displayed) Labs Reviewed  URINALYSIS, ROUTINE W REFLEX MICROSCOPIC - Abnormal; Notable for the following components:      Result Value   Color, Urine STRAW (*)    Glucose, UA 50 (*)    All other components within normal limits  I-STAT CHEM 8, ED - Abnormal; Notable for the following components:   Glucose, Bld 165 (*)    Calcium, Ion 1.07 (*)    All other components within normal limits  PROTIME-INR  APTT  CBC  DIFFERENTIAL  RAPID URINE DRUG SCREEN, HOSP PERFORMED  ETHANOL  COMPREHENSIVE METABOLIC PANEL    EKG EKG Interpretation  Date/Time:  Sunday August 29 2022 21:32:51 EST Ventricular Rate:  57 PR Interval:  182 QRS Duration: 88 QT Interval:  440 QTC Calculation: 428 R Axis:   0 Text Interpretation: Sinus bradycardia Otherwise normal ECG When compared with ECG of 29-Aug-2022 19:44, PREVIOUS ECG IS PRESENT No significant change since last tracing Confirmed by Isla Pence [phone]) on 08/29/2022 9:45:20 PM  Radiology CT ANGIO HEAD NECK W WO CM  Result Date: 08/29/2022 CLINICAL DATA:  Stroke suspected EXAM: CT ANGIOGRAPHY HEAD AND NECK TECHNIQUE: Multidetector CT imaging of the head and neck was performed using the standard protocol during bolus administration of intravenous contrast. Multiplanar CT image reconstructions and MIPs were obtained to evaluate the vascular anatomy. Carotid stenosis measurements (when applicable) are obtained utilizing NASCET criteria, using the distal internal carotid diameter as the denominator. RADIATION DOSE REDUCTION: This exam was performed according to the departmental dose-optimization program which includes automated exposure control, adjustment of the mA and/or kV according to patient size and/or use of iterative reconstruction technique. CONTRAST:  52m OMNIPAQUE IOHEXOL 350 MG/ML SOLN COMPARISON:  01/08/2021 CT head, no prior CTA FINDINGS: CT HEAD FINDINGS Brain: No evidence of acute  infarct, hemorrhage, mass, mass effect, or midline shift. No hydrocephalus or extra-axial fluid collection. Vascular: No hyperdense vessel. Skull: Normal. Negative for fracture or focal lesion. Sinuses/Orbits: No acute finding. Other: The mastoid air cells are well aerated. CTA NECK FINDINGS Aortic arch: Two-vessel arch with a common origin of the brachiocephalic and left common carotid arteries. Imaged portion shows no evidence of aneurysm or dissection. No significant stenosis of the major arch vessel origins. Right carotid system: No evidence of stenosis, dissection, or occlusion. Left carotid system: No evidence of stenosis, dissection, or occlusion. Vertebral arteries: No evidence of stenosis,  dissection, or occlusion. Skeleton: No acute osseous abnormality. Other neck: Negative. Upper chest: No focal pulmonary opacity or pleural effusion. Review of the MIP images confirms the above findings CTA HEAD FINDINGS Anterior circulation: Both internal carotid arteries are patent to the termini, with mild stenosis in the right cavernous segment. A1 segments patent. Normal anterior communicating artery. Anterior cerebral arteries are patent to their distal aspects. No M1 stenosis or occlusion. In the proximal more posterior right M2, there is a 2 mm superiorly directed outpouching (series 11, image 103 and series 13, image 67). MCA branches perfused and symmetric. Posterior circulation: Vertebral arteries patent to the vertebrobasilar junction without stenosis. Left PICA patent proximally. Prominent right AICA. Basilar patent to its distal aspect. Superior cerebellar arteries patent proximally. Patent P1 segments. PCAs perfused to their distal aspects without stenosis. The bilateral posterior communicating arteries are patent. Venous sinuses: As permitted by contrast timing, patent. Anatomic variants: None significant. Review of the MIP images confirms the above findings IMPRESSION: 1. No acute intracranial process. 2.  No intracranial large vessel occlusion. 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 3. No hemodynamically significant stenosis in the neck. 4. 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 Electronically Signed   By: Merilyn Baba M.D.   On: 08/29/2022 22:47    Procedures Procedures    Medications Ordered in ED Medications  ketorolac (TORADOL) 30 MG/ML injection 30 mg (has no administration in time range)  sodium chloride 0.9 % bolus 1,000 mL (has no administration in time range)  meclizine (ANTIVERT) tablet 25 mg (has no administration in time range)  iohexol (OMNIPAQUE) 350 MG/ML injection 75 mL (75 mLs Intravenous Contrast Given 08/29/22 2143)  acetaminophen (TYLENOL) tablet 1,000 mg (1,000 mg Oral Given 08/29/22 2226)    ED Course/ Medical Decision Making/ A&P                           Medical Decision Making Amount and/or Complexity of Data Reviewed Radiology: ordered.  Risk OTC drugs. Prescription drug management.   This patient presents to the ED for concern of weakness, this involves an extensive number of treatment options, and is a complaint that carries with it a high risk of complications and morbidity.  The differential diagnosis includes cva, tia, electrolyte abn, anemia   Co morbidities that complicate the patient evaluation  htn, hld, dm, cad, gi bleed, and depression   Additional history obtained:  Additional history obtained from epic chart review  Lab Tests:  I Ordered, and personally interpreted labs.  The pertinent results include:  ua nl, cbc nl, istat chem 8 nl, uds neg   Imaging Studies ordered:  I ordered imaging studies including ct angio head/neck and mri I independently visualized and interpreted imaging which showed  CT angio: 1. No acute intracranial process.  2. No intracranial large vessel occlusion. Mild stenosis in the  right cavernous ICA.  3. No  hemodynamically significant stenosis in the neck.  4. In the proximal more posterior right M2, there is a 2 mm  superiorly directed outpouching, favored to represent a tiny  aneurysm.   I agree with the radiologist interpretation   Cardiac Monitoring:  The patient was maintained on a cardiac monitor.  I personally viewed and interpreted the cardiac monitored which showed an underlying rhythm of: nsr   Medicines ordered and prescription drug management:  I ordered medication including  tylenol  for pain  Reevaluation of the patient after these medicines showed that the patient improved I have reviewed the patients home medicines and have made adjustments as needed   Test Considered:  ct   Critical Interventions:  Fluids/pain control/cta/mri    Problem List / ED Course:  Weakness:  pt is VAN - and is out of the window for TNK.  Stroke work up will be done, but she is not a code stroke.  CTA neg.  I will add on a MRI. Pt also c/o headache, so I will order headache tx.  If MRI nl and pt able to ambulate, I think she can go home.  MRI pending at shift change.  Pt signed out to Dr. Roxanne Mins.   Reevaluation:  After the interventions noted above, I reevaluated the patient and found that they have :improved   Social Determinants of Health:  Lives at home   Dispostion:  Pending MRI        Final Clinical Impression(s) / ED Diagnoses Final diagnoses:  Weakness    Rx / DC Orders ED Discharge Orders     None         Isla Pence, MD 08/29/22 2304

## 2022-08-29 NOTE — ED Notes (Signed)
Pt taken to MRI  

## 2022-08-29 NOTE — ED Provider Notes (Signed)
Patient was seen briefly by myself as well as PA provider in triage.  Briefly this is a 66 year old female with a history of hypertension, hyperlipidemia, borderline diabetes, who reports that she took a nap around 3 PM this afternoon.  When she woke up sometime later (she is not certain exactly when), she noted that she was having left facial tingling and a sensation of vertigo and dizziness, and right leg weakness.  My exam the patient is hypertensive, heart rate within normal limits.  Neurological exam does not show any gaze deviation.  She does not have any visual field deficits or hemineglect.  She does not have aphasia.  She does have some questionable weakness of the right lower extremity with hip flexion compared to the left.  She also reports paresthesias on the left half of her face.  Patient's last known well was 3 PM.  Therefore she is outside the window of thrombolytic therapy and code stroke activation, and she is not positive for [facility] criteria.  However CT imaging has been ordered.   Wyvonnia Dusky, MD 08/29/22 2014

## 2022-08-29 NOTE — ED Provider Triage Note (Signed)
Emergency Medicine Provider Triage Evaluation Note  Tanya Walsh , a 66 y.o. female  was evaluated in triage.  Pt complains of sudden onset dizziness around 1.5 hours ago after waking up from sleep. Some numbness in the right side of the mouth. Last known normal 1500. Previous hx MI, HTN, borderline high blood sugar, HLD.  Reports last known well 3 PM this afternoon, lay down for a nap, woke up perhaps 1 to 2 hours ago.  Review of Systems  Positive: Numbness, weakness, dizziness. Negative: Facial droop, dysarthria  Physical Exam  BP (!) 172/92 (BP Location: Left Arm)   Pulse 62   Temp 98 F (36.7 C)   Resp 18   SpO2 98%  Gen:   Awake, no distress   Resp:  Normal effort  MSK:   Moves extremities without difficulty  Other:  Patient with questionable decreased preference for left-sided gaze, however is able to fully look to the left on her own.  She endorses left-sided facial numbness, left arm, left leg numbness compared to the right.  She can still feel some touch but reports decreased intensity.  Alert and oriented x 3.  Medical Decision Making  Medically screening exam initiated at 8:03 PM.  Appropriate orders placed.  Tanya Walsh was informed that the remainder of the evaluation will be completed by another provider, this initial triage assessment does not replace that evaluation, and the importance of remaining in the ED until their evaluation is complete.  Patient activated as a code medical, but outside of TNK window, with no evidence of LVO   Anselmo Pickler, PA-C 08/29/22 2003

## 2022-08-29 NOTE — ED Notes (Signed)
Pt transported to CT ?

## 2022-08-29 NOTE — ED Provider Notes (Signed)
Care assumed from Dr. Gilford Raid, patient with unusual pattern of numbness and weakness pending MRI to rule out stroke.  MRI shows no evidence of stroke or other acute process.  I have independently viewed the images, and agree with radiologist's interpretation.  Patient still claims that she has left-sided facial numbness and feels weak in her legs.  I have requested patient be ambulated to make sure she is safe for discharge.  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  Patient was able to ambulate although she is still stating that her legs feel weak.  She is felt to be safe for discharge, I am referring her to neurology for further evaluation of her numbness and weakness.  Results for orders placed or performed during the hospital encounter of 08/29/22  Protime-INR  Result Value Ref Range   Prothrombin Time 13.3 11.4 - 15.2 seconds   INR 1.0 0.8 - 1.2  APTT  Result Value Ref Range   aPTT 27 24 - 36 seconds  CBC  Result Value Ref Range   WBC 6.3 4.0 - 10.5 K/uL   RBC 4.63 3.87 - 5.11 MIL/uL   Hemoglobin 13.6 12.0 - 15.0 g/dL   HCT 40.9 36.0 - 46.0 %   MCV 88.3 80.0 - 100.0 fL   MCH 29.4 26.0 - 34.0 pg   MCHC 33.3 30.0 - 36.0 g/dL   RDW 13.4 11.5 - 15.5 %   Platelets 340 150 - 400 K/uL   nRBC 0.0 0.0 - 0.2 %  Differential  Result Value Ref Range   Neutrophils Relative % 61 %   Neutro Abs 3.9 1.7 - 7.7 K/uL   Lymphocytes Relative 27 %   Lymphs Abs 1.7 0.7 - 4.0 K/uL   Monocytes Relative 8 %   Monocytes Absolute 0.5 0.1 - 1.0 K/uL   Eosinophils Relative 3 %   Eosinophils Absolute 0.2 0.0 - 0.5 K/uL   Basophils Relative 1 %   Basophils Absolute 0.0 0.0 - 0.1 K/uL   Immature Granulocytes 0 %   Abs Immature Granulocytes 0.02 0.00 - 0.07 K/uL  Urine rapid drug screen (hosp performed)  Result Value Ref Range   Opiates NONE DETECTED NONE DETECTED   Cocaine NONE DETECTED NONE DETECTED   Benzodiazepines NONE DETECTED NONE DETECTED    Amphetamines NONE DETECTED NONE DETECTED   Tetrahydrocannabinol NONE DETECTED NONE DETECTED   Barbiturates NONE DETECTED NONE DETECTED  Urinalysis, Routine w reflex microscopic Urine, Clean Catch  Result Value Ref Range   Color, Urine STRAW (A) YELLOW   APPearance CLEAR CLEAR   Specific Gravity, Urine 1.015 1.005 - 1.030   pH 6.0 5.0 - 8.0   Glucose, UA 50 (A) NEGATIVE mg/dL   Hgb urine dipstick NEGATIVE NEGATIVE   Bilirubin Urine NEGATIVE NEGATIVE   Ketones, ur NEGATIVE NEGATIVE mg/dL   Protein, ur NEGATIVE NEGATIVE mg/dL   Nitrite NEGATIVE NEGATIVE   Leukocytes,Ua NEGATIVE NEGATIVE  Comprehensive metabolic panel  Result Value Ref Range   Sodium 137 135 - 145 mmol/L   Potassium 3.7 3.5 - 5.1 mmol/L   Chloride 105 98 - 111 mmol/L   CO2 25 22 - 32 mmol/L   Glucose, Bld 155 (H) 70 - 99 mg/dL   BUN 9 8 - 23 mg/dL   Creatinine, Ser 0.84 0.44 - 1.00 mg/dL   Calcium 8.3 (L) 8.9 - 10.3 mg/dL   Total Protein 6.1 (L) 6.5 - 8.1 g/dL   Albumin 3.3 (L) 3.5 -  5.0 g/dL   AST 21 15 - 41 U/L   ALT 14 0 - 44 U/L   Alkaline Phosphatase 59 38 - 126 U/L   Total Bilirubin 0.6 0.3 - 1.2 mg/dL   GFR, Estimated >60 >60 mL/min   Anion gap 7 5 - 15  I-stat chem 8, ED  Result Value Ref Range   Sodium 135 135 - 145 mmol/L   Potassium 4.8 3.5 - 5.1 mmol/L   Chloride 98 98 - 111 mmol/L   BUN 16 8 - 23 mg/dL   Creatinine, Ser 0.80 0.44 - 1.00 mg/dL   Glucose, Bld 165 (H) 70 - 99 mg/dL   Calcium, Ion 1.07 (L) 1.15 - 1.40 mmol/L   TCO2 27 22 - 32 mmol/L   Hemoglobin 14.6 12.0 - 15.0 g/dL   HCT 43.0 36.0 - 46.0 %   MR BRAIN WO CONTRAST  Result Date: 08/30/2022 CLINICAL DATA:  Dizziness, left facial tingling, stroke suspected EXAM: MRI HEAD WITHOUT CONTRAST TECHNIQUE: Multiplanar, multiecho pulse sequences of the brain and surrounding structures were obtained without intravenous contrast. COMPARISON:  05/08/2019 FINDINGS: Brain: No restricted diffusion to suggest acute or subacute infarct.No acute  hemorrhage, mass, mass effect, or midline shift. No hydrocephalus or extra-axial collection.No hemosiderin deposition to suggest remote hemorrhage.Normal pituitary and craniocervical junction.Scattered T2 hyperintense signal in the periventricular white matter, likely the sequela of mild chronic small vessel ischemic disease. Vascular: Patent arterial flow voids. Skull and upper cervical spine: Normal marrow signal. Sinuses/Orbits: Clear paranasal sinuses.No acute finding in the orbits. Other: The mastoid air cells are well aerated. IMPRESSION: No acute intracranial process. No evidence of acute or subacute infarct. Electronically Signed   By: Merilyn Baba M.D.   On: 08/30/2022 00:44   CT ANGIO HEAD NECK W WO CM  Result Date: 08/29/2022 CLINICAL DATA:  Stroke suspected EXAM: CT ANGIOGRAPHY HEAD AND NECK TECHNIQUE: Multidetector CT imaging of the head and neck was performed using the standard protocol during bolus administration of intravenous contrast. Multiplanar CT image reconstructions and MIPs were obtained to evaluate the vascular anatomy. Carotid stenosis measurements (when applicable) are obtained utilizing NASCET criteria, using the distal internal carotid diameter as the denominator. RADIATION DOSE REDUCTION: This exam was performed according to the departmental dose-optimization program which includes automated exposure control, adjustment of the mA and/or kV according to patient size and/or use of iterative reconstruction technique. CONTRAST:  78m OMNIPAQUE IOHEXOL 350 MG/ML SOLN COMPARISON:  01/08/2021 CT head, no prior CTA FINDINGS: CT HEAD FINDINGS Brain: No evidence of acute infarct, hemorrhage, mass, mass effect, or midline shift. No hydrocephalus or extra-axial fluid collection. Vascular: No hyperdense vessel. Skull: Normal. Negative for fracture or focal lesion. Sinuses/Orbits: No acute finding. Other: The mastoid air cells are well aerated. CTA NECK FINDINGS Aortic arch: Two-vessel arch  with a common origin of the brachiocephalic and left common carotid arteries. Imaged portion shows no evidence of aneurysm or dissection. No significant stenosis of the major arch vessel origins. Right carotid system: No evidence of stenosis, dissection, or occlusion. Left carotid system: No evidence of stenosis, dissection, or occlusion. Vertebral arteries: No evidence of stenosis, dissection, or occlusion. Skeleton: No acute osseous abnormality. Other neck: Negative. Upper chest: No focal pulmonary opacity or pleural effusion. Review of the MIP images confirms the above findings CTA HEAD FINDINGS Anterior circulation: Both internal carotid arteries are patent to the termini, with mild stenosis in the right cavernous segment. A1 segments patent. Normal anterior communicating artery. Anterior cerebral arteries are patent to  their distal aspects. No M1 stenosis or occlusion. In the proximal more posterior right M2, there is a 2 mm superiorly directed outpouching (series 11, image 103 and series 13, image 67). MCA branches perfused and symmetric. Posterior circulation: Vertebral arteries patent to the vertebrobasilar junction without stenosis. Left PICA patent proximally. Prominent right AICA. Basilar patent to its distal aspect. Superior cerebellar arteries patent proximally. Patent P1 segments. PCAs perfused to their distal aspects without stenosis. The bilateral posterior communicating arteries are patent. Venous sinuses: As permitted by contrast timing, patent. Anatomic variants: None significant. Review of the MIP images confirms the above findings IMPRESSION: 1. No acute intracranial process. 2. No intracranial large vessel occlusion. 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 3. No hemodynamically significant stenosis in the neck. 4. 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 Electronically Signed   By: Merilyn Baba M.D.   On:  32/95/1884 16:60       Delora Fuel, MD 63/01/60 2242

## 2022-08-29 NOTE — ED Notes (Signed)
[phone] Daughter Hardie Shackleton would like update when results are completed.

## 2022-08-29 NOTE — ED Triage Notes (Signed)
Patient arrived with EMS from home reports left facial tingling and dizziness onset approx. 6 pm this evening . CBG= 272. Evaluated by EDP at arrival .

## 2022-08-30 LAB — COMPREHENSIVE METABOLIC PANEL
ALT: 14 U/L (ref 0–44)
AST: 21 U/L (ref 15–41)
Albumin: 3.3 g/dL — ABNORMAL LOW (ref 3.5–5.0)
Alkaline Phosphatase: 59 U/L (ref 38–126)
Anion gap: 7 (ref 5–15)
BUN: 9 mg/dL (ref 8–23)
CO2: 25 mmol/L (ref 22–32)
Calcium: 8.3 mg/dL — ABNORMAL LOW (ref 8.9–10.3)
Chloride: 105 mmol/L (ref 98–111)
Creatinine, Ser: 0.84 mg/dL (ref 0.44–1.00)
GFR, Estimated: >60 mL/min (ref >60)
Glucose, Bld: 155 mg/dL — ABNORMAL HIGH (ref 70–99)
Potassium: 3.7 mmol/L (ref 3.5–5.1)
Sodium: 137 mmol/L (ref 135–145)
Total Bilirubin: 0.6 mg/dL (ref 0.3–1.2)
Total Protein: 6.1 g/dL — ABNORMAL LOW (ref 6.5–8.1)

## 2022-08-30 NOTE — ED Notes (Signed)
Recollected CMP.

## 2022-08-30 NOTE — Discharge Instructions (Addendum)
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  I am referring you to a neurosurgeon to evaluate it.  Return if your symptoms are worsening.

## 2022-08-30 NOTE — ED Notes (Signed)
Ambulated PT . PT stated she had weakness in her legs and wasn't walking like her usual self

## 2022-08-31 ENCOUNTER — Encounter: Payer: Self-pay | Admitting: Hematology

## 2022-09-03 ENCOUNTER — Ambulatory Visit (HOSPITAL_COMMUNITY)
Admission: RE | Admit: 2022-09-03 | Discharge: 2022-09-03 | Disposition: A | Discharge status: Home / Self Care | Payer: Medicare Other | Source: Ambulatory Visit | Attending: Hematology | Admitting: Hematology

## 2022-09-03 DIAGNOSIS — C7A019 Malignant carcinoid tumor of the small intestine, unspecified portion: Secondary | ICD-10-CM | POA: Diagnosis present

## 2022-09-03 DIAGNOSIS — C7A8 Other malignant neuroendocrine tumors: Secondary | ICD-10-CM

## 2022-09-03 MED ORDER — IOHEXOL 300 MG/ML  SOLN
100.0000 mL | Freq: Once | INTRAMUSCULAR | Status: AC | PRN
  Administered 2022-09-03: 100 mL via INTRAVENOUS

## 2022-09-06 NOTE — Progress Notes (Unsigned)
[place]   [phone]) [phone] [phone]) [phone]   Clinic Follow up Note   Patient Care Team: Katherina Mires, MD as PCP - General (Family Medicine) Sueanne Margarita, MD as PCP - Sleep Medicine (Cardiology) Belva Crome, MD as PCP - Cardiology (Cardiology) Pieter Partridge, DO as Consulting Physician (Neurology) Alla Feeling, NP as Nurse Practitioner (Nurse Practitioner) Truitt Merle, MD as Consulting Physician (Oncology) Jonnie Finner, RN (Inactive) as Oncology Nurse Navigator 09/07/2022  I connected with Tanya Walsh on 09/07/22 at 10:00 AM EST by telephone visit and verified that I am speaking with the correct person using two identifiers.   I discussed the limitations, risks, security and privacy concerns of performing an evaluation and management service by telemedicine and the availability of in-person appointments. I also discussed with the patient that there may be a patient responsible charge related to this service. The patient expressed understanding and agreed to proceed.   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 Ronnette Juniper Impression - Normal upper third of esophagus, middle third of esophagus and lower third of esophagus. - Z-line regular, 38 cm from the incisors. - Erythematous mucosa in  the antrum. Biopsied. Clip (MR conditional) was placed. - Normal examined duodenum. Biopsied.   12/10/2020 Pathology Results   FINAL MICROSCOPIC DIAGNOSIS:   A. DUODENUM, BIOPSY:  - Duodenal mucosa with no significant pathologic findings.  - Negative for increased intraepithelial lymphocytes and villous  architectural changes.   B. STOMACH, ANTRUM, BIOPSY:  - Reactive gastropathy.  - Warthin-Starry stain is negative for Helicobacter pylori.    12/11/2020 Procedure   Colonoscopy, Dr. Ronnette Juniper impression - One 5 mm polyp in the transverse colon, removed piecemeal using a cold biopsy forceps. Resected and retrieved. - Diverticulosis in the sigmoid colon, in the descending colon and in the transverse colon. - The examined portion of the ileum was normal. - Non-bleeding internal hemorrhoids.   12/11/2020 Pathology Results   FINAL MICROSCOPIC DIAGNOSIS:   A. COLON, TRANSVERSE, POLYPECTOMY:  - Tubular adenoma.  - Negative for high grade dysplasia.    12/24/2020 Procedure   Capsule endoscopy report: A polypoid nodular growth was noted at 3 hours and 30 minutes and an ulcerated, polypoid growth was noted at 3 hours and 54 minutes which is likely the cause of obscure GI blood loss   01/23/2021 Imaging   CT entero AP w contrast IMPRESSION: 1. No acute findings identified within the abdomen or pelvis. 2. There are several focal areas of intraluminal hyperenhancement within the small bowel loops. The largest is in the nondilated mid to distal jejunum measuring 1.3 cm. Cannot rule out underlying small bowel neoplasm. Correlation with tissue sampling results advised. Correlation with results from capsule endoscopy. 3. There is a enhancing soft tissue nodule with central calcification within the central small bowel mesentery. This is nonspecific and may represent sequelae of inflammation/infection. Primary  differential considerations include carcinoid tumor or metastatic adenopathy. If  there is a clinical concern for carcinoid tumor consider further investigation with DOTATATE PET. 4. Left adrenal gland adenoma. 5. Distal colonic diverticulosis without signs of acute diverticulitis. 6. Aortic atherosclerosis.   02/16/2021 PET scan   IMPRESSION: 1. Evidence of well differentiated small bowel neuroendocrine tumor with mesenteric metastasis. 2. Approximately 7 discrete small bowel lesions with intense radiotracer activity. Lesion depicted on comparison CT enterography. 3. Two intensely radiotracer avid mesenteric implants centrally within the small bowel mesentery. 4. Single hepatic metastasis with a second potential hepatic metastasis versus peritoneal implant. 5. No small bowel obstruction. 6. No evidence of metastatic disease outside the abdomen pelvis. 7. Patient may be a candidate for peptide receptor radiotherapy (Lu-177 DOTATATE) available at [place] [phone]).   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 No new or progressive interval findings. 2. Stable appearance of the 17 mm nodule central mesenteric nodule seen to be hypermetabolic on recent PET-CT. The second mesenteric lesion identified on that study is not discernible today. 3. Stable 1.5 cm subcapsular low-density lesion posterior right liver since PET-CT 02/16/2021. 4. 2 small bowel nodules are identified on imaging today although 7 discrete hypermetabolic small bowel lesions were seen on previous PET-CT. 5. Left colonic diverticulosis without diverticulitis. 6. Aortic Atherosclerosis  (ICD10-I70.0).   02/22/2022 Imaging   EXAM: CT ABDOMEN AND PELVIS WITH CONTRAST  IMPRESSION: 1. Mural soft tissue nodule of the small bowel in the midline ventral abdomen is unchanged, as is a small adjacent metastatic mesenteric nodule. 2. No evidence of new metastatic disease in the abdomen or pelvis. 3. Diverticulosis without evidence of acute diverticulitis. 4. Coronary artery disease.     CURRENT THERAPY: Sandostatin injection q28 days, starting 03/12/22   INTERVAL HISTORY: Ms. Dueitt presents virtually for scan review. Last seen by Dr. Burr Medico 08/19/22.  She continues monthly Sandostatin injections which she tolerates well.  Bowels moving better, no diarrhea.  Pain/cramping are occasional but overall less often than before treatment.  She developed weakness and left facial numbness and went to ED. Work up showed no ischemia or occlusion but showed possibly tiny aneurysm.  Still having intermittent facial numbness.  She also has occasional fleeting chest pains.  She has mentioned this to her cardiologist.  She also has other body pain which is relieved with tramadol.    REVIEW OF SYSTEMS:   All other systems were reviewed with the patient and are negative.  MEDICAL HISTORY:  Past Medical History:  Diagnosis Date   Coronary artery disease    Depression    GI bleed 07/17/2015   Hypertension    Myocardial infarction [facility])    Nocturia 08/14/2019   Refusal of blood transfusions as patient is Jehovah's Witness     SURGICAL HISTORY: Past Surgical History:  Procedure Laterality Date   ABDOMINAL HYSTERECTOMY     BACK SURGERY     BIOPSY  12/10/2020   Procedure: BIOPSY;  Surgeon: Ronnette Juniper, MD;  Location: [facility] ENDOSCOPY;  Service: Gastroenterology;;   BIOPSY  12/11/2020   Procedure: BIOPSY;  Surgeon: Ronnette Juniper, MD;  Location: [facility]  ENDOSCOPY;  Service: Gastroenterology;;   COLONOSCOPY WITH PROPOFOL N/A 12/11/2020   Procedure: COLONOSCOPY WITH PROPOFOL;  Surgeon: Ronnette Juniper, MD;   Location: [place];  Service: Gastroenterology;  Laterality: N/A;   ESOPHAGOGASTRODUODENOSCOPY N/A 07/17/2015   Procedure: ESOPHAGOGASTRODUODENOSCOPY (EGD);  Surgeon: Wonda Horner, MD;  Location: [facility] ENDOSCOPY;  Service: Endoscopy;  Laterality: N/A;   ESOPHAGOGASTRODUODENOSCOPY (EGD) WITH PROPOFOL N/A 12/10/2020   Procedure: ESOPHAGOGASTRODUODENOSCOPY (EGD) WITH PROPOFOL;  Surgeon: Ronnette Juniper, MD;  Location: [place];  Service: Gastroenterology;  Laterality: N/A;   HEMOSTASIS CLIP PLACEMENT  12/10/2020   Procedure: HEMOSTASIS CLIP PLACEMENT;  Surgeon: Ronnette Juniper, MD;  Location: [place];  Service: Gastroenterology;;    I have reviewed the social history and family history with the patient and they are unchanged from previous note.  ALLERGIES:  has No Known Allergies.  MEDICATIONS:  Current Outpatient Medications  Medication Sig Dispense Refill   cyclobenzaprine (FLEXERIL) 5 MG tablet Take 1 tablet (5 mg total) by mouth 3 (three) times daily as needed for muscle spasms. 30 tablet 0   DULoxetine (CYMBALTA) 60 MG capsule Take 60 mg by mouth daily.     fluticasone (FLONASE) 50 MCG/ACT nasal spray Place 1 spray into both nostrils daily.     losartan-hydrochlorothiazide (HYZAAR) 50-12.5 MG tablet Take 1 tablet by mouth daily.     Multiple Vitamins-Minerals (MULTIVITAMIN ADULTS 50+) TABS Take 1 tablet by mouth daily.     nitroGLYCERIN (NITROSTAT) 0.4 MG SL tablet Place 1 tablet (0.4 mg total) under the tongue every 5 (five) minutes as needed for chest pain. 30 tablet 0   pantoprazole (PROTONIX) 40 MG tablet Take 40 mg by mouth daily.     rosuvastatin (CRESTOR) 20 MG tablet Take 20 mg by mouth daily.     traMADol (ULTRAM) 50 MG tablet Take 1 tablet (50 mg total) by mouth every 12 (twelve) hours as needed. 30 tablet 0   trolamine salicylate (ASPERCREME) 10 % cream Apply 1 application topically as needed for muscle pain.     No current facility-administered medications for this visit.     PHYSICAL EXAMINATION: ECOG PERFORMANCE STATUS: 1 - Symptomatic but completely ambulatory  There were no vitals filed for this visit. There were no vitals filed for this visit.  Patient appears well over the phone.  Voice is strong, speech is clear.  Mood/affect appear normal for situation.  No cough or conversational dyspnea.  LABORATORY DATA:  I have reviewed the data as listed    Latest Ref Rng & Units 08/29/2022    8:49 PM 08/29/2022    8:42 PM 08/19/2022   11:08 AM  CBC  WBC 4.0 - 10.5 K/uL  6.3  5.6   Hemoglobin 12.0 - 15.0 g/dL 14.6  13.6  13.6   Hematocrit 36.0 - 46.0 % 43.0  40.9  41.0   Platelets 150 - 400 K/uL  340  319         Latest Ref Rng & Units 08/30/2022    1:41 AM 08/29/2022    8:49 PM 08/19/2022   11:08 AM  CMP  Glucose 70 - 99 mg/dL 155  165  130   BUN 8 - 23 mg/dL '9  16  13   '$ Creatinine 0.44 - 1.00 mg/dL 0.84  0.80  0.86   Sodium 135 - 145 mmol/L 137  135  141   Potassium 3.5 - 5.1 mmol/L 3.7  4.8  4.2   Chloride 98 - 111 mmol/L 105  98  105  CO2 22 - 32 mmol/L 25   32   Calcium 8.9 - 10.3 mg/dL 8.3   9.6   Total Protein 6.5 - 8.1 g/dL 6.1   6.8   Total Bilirubin 0.3 - 1.2 mg/dL 0.6   0.4   Alkaline Phos 38 - 126 U/L 59   66   AST 15 - 41 U/L 21   14   ALT 0 - 44 U/L 14   17       RADIOGRAPHIC STUDIES: I have personally reviewed the radiological images as listed and agreed with the findings in the report. No results found.   ASSESSMENT & PLAN: 66 year old female   1.Metastatic neuroendocrine tumor of small bowel, to mesentery and liver, stage IV -she presented with symptomatic anemia from GI bleeding, abdominal pain, and flushing in 11/2020, work up showed 2 gastric polyps one of which was ulcerated. -dotatate PET diagnostic for metastatic neuroendocrine tumor of the small bowel to the mesentery and liver.  We do not feel a biopsy is necessary for confirmation but will be needed for Lutathera treatment down the line -Her case was  discussed in GI tumor board, she is not a surgical candidate due to metastatic disease.   -IR attempted liver biopsy but lesion was not visible on ultrasound -She began first line sandostatin injection in 02/2021, tolerating well -Ms. Smithi appears stable by phone. Tolerating Sandostatin without significant side effects. NET symptoms of pain/cramping and frequent BM have improved on treatment -I reviewed her restaging CT CAP which shows stable disease. Will continue same treatment -Next injection 09/17/22 and continue monthly -F/up with inj in March  2. 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 Tomi Likens on 1/9  3. Intermittent chest and body pains -She has fibromyalgia. Some body pains respond to tramadol. She also has flexril and cymbalta -She has intermittent transient chest pains that have been stable. No other red flags. She has discussed with cardiology who recommends to hold further w/up unless symptoms progress -No lung nodules or abnormal image findings on recent CT CAP 12/22   PLAN: -Recent ED work up, labs, and CT CAP reviewed -Stable metastatic NET, continue monthly sandostatin injections, next 1/5 -ED f/up with PCP, neuro, and neurosurgery over the next few days as scheduled -F/up in March     I discussed the assessment and treatment plan with the patient. The patient was provided an opportunity to ask questions and all were answered. The patient agreed with the plan and demonstrated an understanding of the instructions.   The patient was advised to call back or seek an in-person evaluation if the symptoms worsen or if the condition fails to improve as anticipated. All questions were answered. I spent 12 minutes  counseling the patient on the phone non-face to face. The total time spent in the appointment was 20 minutes and more than 50% was on counseling and review of test results.     Alla Feeling, NP 09/07/22

## 2022-09-07 ENCOUNTER — Inpatient Hospital Stay (HOSPITAL_BASED_OUTPATIENT_CLINIC_OR_DEPARTMENT_OTHER): Payer: Medicare Other | Admitting: Nurse Practitioner

## 2022-09-07 ENCOUNTER — Encounter: Payer: Self-pay | Admitting: Nurse Practitioner

## 2022-09-07 DIAGNOSIS — C7A8 Other malignant neuroendocrine tumors: Secondary | ICD-10-CM | POA: Diagnosis not present

## 2022-09-08 ENCOUNTER — Telehealth: Payer: Self-pay | Admitting: Hematology

## 2022-09-08 NOTE — Telephone Encounter (Signed)
Patient called to r/s. Patient notified of new appointments.

## 2022-09-09 ENCOUNTER — Telehealth: Payer: Self-pay

## 2022-09-09 NOTE — Telephone Encounter (Addendum)
Called relayed message below patient voiced understanding      ----- Message from Truitt Merle, MD sent at 09/07/2022  2:19 PM EST ----- Please let pt know her CT scan results, which showed stable disease overall, no new concerns. Let me know if she has any questions. Thanks   Truitt Merle  09/07/2022

## 2022-09-09 NOTE — Telephone Encounter (Addendum)
No answer left message to call back    ----- Message from Truitt Merle, MD sent at 09/07/2022  2:19 PM EST ----- Please let pt know her CT scan results, which showed stable disease overall, no new concerns. Let me know if she has any questions. Thanks   Truitt Merle  09/07/2022

## 2022-09-17 ENCOUNTER — Inpatient Hospital Stay: Payer: Medicare Other

## 2022-09-20 NOTE — Progress Notes (Deleted)
NEUROLOGY FOLLOW UP OFFICE NOTE  Tanya Walsh [id number]  Assessment/Plan:   ***  Subjective:  Tanya Walsh is a 67 year old right-handed female with CAD s/p MI, OSA and HTN who follows up for recent ED visit.   Last seen in July 2021 for recurrent episodes of left sided facial numbness with chest discomfort beginning in 2020.  Numbness radiates down neck and into the left arm and hand.  She says the arm feels weak.  It may be associated with blurred vision, diaphoresis and feeling like she is going to pass out.  She reports associated neck and upper thoracic spinal pain and diffuse headache ("like nerves in my head").  Symptoms last several hours.  This has been evaluated in the ED.  MRI of brain without contrast on 05/08/2019 was normal.  Cardiac workup has been negative, including EKG, troponins and coronary CT angio.  CTA of head and neck on 02/15/2020 was unremarkable.  EEG on 04/30/2020 was normal.   She was seen again in the ED on 08/29/2022 for another episode of left sided facial numbness and dizziness but also endorsed right leg weakness.  No headache.  MRI of brain without contrast personally reviewed showed mild chronic small vessel ischemic changes but no acute findings.  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    PAST MEDICAL HISTORY: Past Medical History:  Diagnosis Date   Coronary artery disease    Depression    GI bleed 07/17/2015   Hypertension    Myocardial infarction [facility])    Nocturia 08/14/2019   Refusal of blood transfusions as patient is Jehovah's Witness     MEDICATIONS: Current Outpatient Medications on File Prior to Visit  Medication Sig Dispense Refill   cyclobenzaprine (FLEXERIL) 5 MG tablet Take 1 tablet (5 mg total) by mouth 3 (three) times daily as needed for muscle spasms. 30 tablet 0   DULoxetine (CYMBALTA) 60 MG capsule Take 60 mg by mouth  daily.     fluticasone (FLONASE) 50 MCG/ACT nasal spray Place 1 spray into both nostrils daily.     losartan-hydrochlorothiazide (HYZAAR) 50-12.5 MG tablet Take 1 tablet by mouth daily.     Multiple Vitamins-Minerals (MULTIVITAMIN ADULTS 50+) TABS Take 1 tablet by mouth daily.     nitroGLYCERIN (NITROSTAT) 0.4 MG SL tablet Place 1 tablet (0.4 mg total) under the tongue every 5 (five) minutes as needed for chest pain. 30 tablet 0   pantoprazole (PROTONIX) 40 MG tablet Take 40 mg by mouth daily.     rosuvastatin (CRESTOR) 20 MG tablet Take 20 mg by mouth daily.     traMADol (ULTRAM) 50 MG tablet Take 1 tablet (50 mg total) by mouth every 12 (twelve) hours as needed. 30 tablet 0   trolamine salicylate (ASPERCREME) 10 % cream Apply 1 application topically as needed for muscle pain.     No current facility-administered medications on file prior to visit.    ALLERGIES: No Known Allergies  FAMILY HISTORY: Family History  Problem Relation Age of Onset   Cancer Mother        brain   Heart disease Father    Heart disease Brother    Breast cancer Niece       Objective:  *** General: No acute distress.  Patient appears ***-groomed.   Head:  Normocephalic/atraumatic Eyes:  Fundi examined but not visualized Neck: supple, no paraspinal tenderness,  full range of motion Heart:  Regular rate and rhythm Lungs:  Clear to auscultation bilaterally Back: No paraspinal tenderness Neurological Exam: alert and oriented to person, place, and time.  Speech fluent and not dysarthric, language intact.  CN II-XII intact. Bulk and tone normal, muscle strength 5/5 throughout.  Sensation to light touch intact.  Deep tendon reflexes 2+ throughout, toes downgoing.  Finger to nose testing intact.  Gait normal, Romberg negative.   Metta Clines, DO  CC: ***

## 2022-09-21 ENCOUNTER — Ambulatory Visit: Payer: Medicare Other | Admitting: Neurology

## 2022-09-23 ENCOUNTER — Other Ambulatory Visit: Payer: Self-pay

## 2022-09-23 ENCOUNTER — Inpatient Hospital Stay: Payer: Medicare Other | Attending: Hematology

## 2022-09-23 VITALS — BP 146/96 | HR 79 | Temp 98.7°F | Resp 18

## 2022-09-23 DIAGNOSIS — C7B8 Other secondary neuroendocrine tumors: Secondary | ICD-10-CM | POA: Diagnosis present

## 2022-09-23 DIAGNOSIS — C7A8 Other malignant neuroendocrine tumors: Secondary | ICD-10-CM | POA: Diagnosis present

## 2022-09-23 MED ORDER — OCTREOTIDE ACETATE 30 MG IM KIT
30.0000 mg | PACK | Freq: Once | INTRAMUSCULAR | Status: AC
  Administered 2022-09-23: 30 mg via INTRAMUSCULAR
  Filled 2022-09-23: qty 1

## 2022-09-28 NOTE — Progress Notes (Signed)
NEUROLOGY FOLLOW UP OFFICE NOTE  Tanya Walsh [id number]  Assessment/Plan:   Recurrent episodes of left sided numbness and dizziness, sometimes with headache, suggestive of migraine.  Small cerebral aneurysm, incidental finding  In case these episodes are migraine, will treat accordingly.  Start topiramate '25mg'$  at bedtime.  Side effects discussed (paresthesias, trouble with concentration, weight loss, kidney stones, glaucoma).  We can increase dose to '50mg'$  at bedtime in 4 weeks if needed Check MRA of head in 6 months to follow up on aneurysm Follow up in 4-5 months.  Subjective:  Tanya Walsh is a 67 year old right-handed female with CAD s/p MI, metastatic malignant endocrine tumor to the liver, DM II, fibromyalgia, OSA and HTN who follows up for recent ED visit.  History supplemented by her accompanying daughter and ED note.   Last seen in July 2021 for recurrent episodes of left sided facial numbness with chest discomfort beginning in 2020.  Numbness radiates down neck and into the left arm and hand.  She says the arm feels weak.  It may be associated with blurred vision, diaphoresis and feeling like she is going to pass out.  She has also actually passed out.  She reports associated neck and upper thoracic spinal pain and sometimes diffuse headache ("like nerves in my head").  No nausea.  Symptoms last several hours.  They occur every one to two months.  This has been evaluated in the ED.  MRI of brain without contrast on 05/08/2019 was normal.  Cardiac workup has been negative, including EKG, troponins and coronary CT angio.  CTA of head and neck on 02/15/2020 was unremarkable.  EEG on 04/30/2020 was normal.   She was seen again in the ED on 08/29/2022 for another episode of left sided facial numbness and dizziness but also endorsed right leg weakness.  No headache.  This time, she had light and sound sensitivity and felt disoriented and has trouble walking.  MRI of brain without  contrast personally reviewed showed mild chronic small vessel ischemic changes but no acute findings.  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  Again, she has some right leg pain and weakness from the back radiating into the groin and down the leg with numbness in the toes.  She has 2 prior back surgeries and has upcoming appointment with orthopedics.  Reports remote history of moderate migraines.  Current medications include:  duloxetine '60mg'$  daily, Flexeril '5mg'$  TID PRN, tramadol '50mg'$  BID PRN Past medications include:  sertraline, tizanidine PAST MEDICAL HISTORY: Past Medical History:  Diagnosis Date   Coronary artery disease    Depression    GI bleed 07/17/2015   Hypertension    Myocardial infarction [facility])    Nocturia 08/14/2019   Refusal of blood transfusions as patient is Jehovah's Witness     MEDICATIONS: Current Outpatient Medications on File Prior to Visit  Medication Sig Dispense Refill   cyclobenzaprine (FLEXERIL) 5 MG tablet Take 1 tablet (5 mg total) by mouth 3 (three) times daily as needed for muscle spasms. 30 tablet 0   DULoxetine (CYMBALTA) 60 MG capsule Take 60 mg by mouth daily.     fluticasone (FLONASE) 50 MCG/ACT nasal spray Place 1 spray into both nostrils daily.     losartan-hydrochlorothiazide (HYZAAR) 50-12.5 MG tablet Take 1 tablet by mouth daily.     Multiple Vitamins-Minerals (MULTIVITAMIN ADULTS 50+) TABS Take  1 tablet by mouth daily.     nitroGLYCERIN (NITROSTAT) 0.4 MG SL tablet Place 1 tablet (0.4 mg total) under the tongue every 5 (five) minutes as needed for chest pain. 30 tablet 0   pantoprazole (PROTONIX) 40 MG tablet Take 40 mg by mouth daily.     rosuvastatin (CRESTOR) 20 MG tablet Take 20 mg by mouth daily.     traMADol (ULTRAM) 50 MG tablet Take 1 tablet (50 mg total) by mouth every 12 (twelve) hours as needed. 30 tablet 0   trolamine salicylate  (ASPERCREME) 10 % cream Apply 1 application topically as needed for muscle pain.     No current facility-administered medications on file prior to visit.    ALLERGIES: No Known Allergies  FAMILY HISTORY: Family History  Problem Relation Age of Onset   Cancer Mother        brain   Heart disease Father    Heart disease Brother    Breast cancer Niece       Objective:  Blood pressure (!) 133/96, pulse 81, height '5\' 3"'$  (1.6 m), weight 189 lb 14.4 oz (86.1 kg), SpO2 97 %. General: No acute distress.  Patient appears well-groomed.   Head:  Normocephalic/atraumatic Eyes:  Fundi examined but not visualized Neck: supple, no paraspinal tenderness, full range of motion Heart:  Regular rate and rhythm Lungs:  Clear to auscultation bilaterally Back: right lower paraspinal tenderness Neurological Exam: alert and oriented to person, place, and time.  Speech fluent and not dysarthric, language intact.  Endorses reduced left V2-V3 sensation.  Otherwise, CN II-XII intact. Bulk and tone normal, muscle strength Some giveway weakness in bilateral hip flexion and knee extension, otherwise 5/5 throughout.  Sensation to pinprick and vibration intact.  Deep tendon reflexes 2+ throughout, toes downgoing.  Finger to nose testing intact.  Gait normal, Romberg negative.   Metta Clines, DO  CC: Suzanna Obey, MD

## 2022-09-29 ENCOUNTER — Encounter: Payer: Self-pay | Admitting: Neurology

## 2022-09-29 ENCOUNTER — Ambulatory Visit: Payer: Medicare Other | Admitting: Neurology

## 2022-09-29 VITALS — BP 133/96 | HR 81 | Ht 63.0 in | Wt 189.9 lb

## 2022-09-29 DIAGNOSIS — R29818 Other symptoms and signs involving the nervous system: Secondary | ICD-10-CM | POA: Diagnosis not present

## 2022-09-29 DIAGNOSIS — I671 Cerebral aneurysm, nonruptured: Secondary | ICD-10-CM | POA: Diagnosis not present

## 2022-09-29 MED ORDER — TOPIRAMATE 25 MG PO TABS: 25.0000 mg | ORAL_TABLET | Freq: Every day | ORAL | 5 refills | Status: DC

## 2022-09-29 NOTE — Patient Instructions (Signed)
I do not know for sure but these spells may be migraine.  Therefore, I would like to start a medication to try and reduce frequency/prevent these episodes.  Start topiramate '25mg'$  at bedtime. We can increase dose in 4 weeks if needed.  Side effects may include numbness and tingling, trouble concentrating, weight loss, increase risk for kidney stones, may increase risk for some types of glaucoma Check MRA of head in 6 months to follow up on tiny aneurysm Follow up in 4-5 months.

## 2022-10-15 ENCOUNTER — Inpatient Hospital Stay: Payer: Medicare Other

## 2022-10-28 ENCOUNTER — Inpatient Hospital Stay: Payer: Medicare Other | Attending: Hematology

## 2022-10-28 ENCOUNTER — Other Ambulatory Visit: Payer: Self-pay

## 2022-10-28 VITALS — BP 139/93 | HR 71 | Resp 18

## 2022-10-28 DIAGNOSIS — C7A8 Other malignant neuroendocrine tumors: Secondary | ICD-10-CM | POA: Insufficient documentation

## 2022-10-28 DIAGNOSIS — C7B8 Other secondary neuroendocrine tumors: Secondary | ICD-10-CM | POA: Insufficient documentation

## 2022-10-28 MED ORDER — OCTREOTIDE ACETATE 30 MG IM KIT
30.0000 mg | PACK | Freq: Once | INTRAMUSCULAR | Status: AC
  Administered 2022-10-28: 30 mg via INTRAMUSCULAR
  Filled 2022-10-28: qty 1

## 2022-11-12 ENCOUNTER — Inpatient Hospital Stay: Payer: Medicare Other

## 2022-11-12 ENCOUNTER — Inpatient Hospital Stay: Payer: Medicare Other | Admitting: Hematology

## 2022-11-24 NOTE — Progress Notes (Unsigned)
[facility]   [phone] [phone]   Clinic Follow up Note   Patient Care Team: Katherina Mires, MD as PCP - General (Family Medicine) Sueanne Margarita, MD as PCP - Sleep Medicine (Cardiology) Belva Crome, MD (Inactive) as PCP - Cardiology (Cardiology) Pieter Partridge, DO as Consulting Physician (Neurology) Alla Feeling, NP as Nurse Practitioner (Nurse Practitioner) Truitt Merle, MD as Consulting Physician (Oncology) Jonnie Finner, RN (Inactive) as Oncology Nurse Navigator Pieter Partridge, DO as Consulting Physician (Neurology)  Date of Service:  11/25/2022  CHIEF COMPLAINT: f/u of metastatic neuroendocrine tumor   CURRENT THERAPY: Sandostatin injection q28 days, starting 03/12/22     ASSESSMENT:  Tanya Walsh is a 67 y.o. 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  Liver biopsy was attempted, but the liver lesion was not visible on ultrasound. -She started Sandostatin injection in June 2022.  Overall tolerating well. Will continue.  -last restaging CT on 09/07/2022 showed stable disease  -She is clinically doing well overall, still has intermittent pain on the left side of abdomen, overall better than last year. -She has developed hyperglycemia, possibly related to Sandostatin injection, she is on metformin now.  Discussed diabetic diet, and I encouraged her to exercise. 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 Ronnette Juniper Impression - Normal upper third of esophagus, middle third of esophagus and lower third of esophagus. - Z-line regular, 38 cm from the incisors. - Erythematous mucosa in the antrum. Biopsied. Clip (MR conditional) was placed. - Normal examined duodenum. Biopsied.   12/10/2020 Pathology Results   FINAL MICROSCOPIC DIAGNOSIS:   A. DUODENUM, BIOPSY:  - Duodenal mucosa with no significant pathologic findings.  - Negative for increased intraepithelial lymphocytes and villous  architectural changes.   B. STOMACH, ANTRUM, BIOPSY:  - Reactive gastropathy.  - Warthin-Starry stain is negative for Helicobacter pylori.    12/11/2020 Procedure   Colonoscopy, Dr. Ronnette Juniper impression - One 5 mm polyp in the transverse colon, removed piecemeal using a cold biopsy forceps. Resected and retrieved. - Diverticulosis in the sigmoid colon, in the descending colon and in the transverse colon. - The examined portion of the ileum was normal. - Non-bleeding internal hemorrhoids.   12/11/2020 Pathology Results   FINAL MICROSCOPIC DIAGNOSIS:   A. COLON, TRANSVERSE, POLYPECTOMY:  - Tubular adenoma.  - Negative for high grade dysplasia.    12/24/2020 Procedure   Capsule endoscopy report: A polypoid nodular growth was noted at 3 hours and 30 minutes and an ulcerated, polypoid growth was noted at 3 hours and 54 minutes which is likely the cause of obscure GI blood loss   01/23/2021 Imaging   CT entero AP w contrast IMPRESSION: 1. No acute findings identified  within the abdomen or pelvis. 2. There are several focal areas of intraluminal hyperenhancement within the small bowel loops. The largest is in the nondilated mid to distal jejunum  measuring 1.3 cm. Cannot rule out underlying small bowel neoplasm. Correlation with tissue sampling results advised. Correlation with results from capsule endoscopy. 3. There is a enhancing soft tissue nodule with central calcification within the central small bowel mesentery. This is nonspecific and may represent sequelae of inflammation/infection. Primary differential considerations include carcinoid tumor or metastatic adenopathy. If there is a clinical concern for carcinoid tumor consider further investigation with DOTATATE PET. 4. Left adrenal gland adenoma. 5. Distal colonic diverticulosis without signs of acute diverticulitis. 6. Aortic atherosclerosis.   02/16/2021 PET scan   IMPRESSION: 1. Evidence of well differentiated small bowel neuroendocrine tumor with mesenteric metastasis. 2. Approximately 7 discrete small bowel lesions with intense radiotracer activity. Lesion depicted on comparison CT enterography. 3. Two intensely radiotracer avid mesenteric implants centrally within the small bowel mesentery. 4. Single hepatic metastasis with a second potential hepatic metastasis versus peritoneal implant. 5. No small bowel obstruction. 6. No evidence of metastatic disease outside the abdomen pelvis. 7. Patient may be a candidate for peptide receptor radiotherapy (Lu-177 DOTATATE) available at [facility] [phone]).   02/26/2021 Initial Diagnosis   Primary malignant neuroendocrine tumor of small intestine ([place])   02/26/2021 Cancer Staging   Staging form: Gastrointestinal Stromal Tumor - Small Intestinal, Esophageal, Colorectal, Mesenteric, and Peritoneal GIST, AJCC 8th Edition - Clinical: Stage IV (cTX, cN0, pM1) - Signed by Alla Feeling, NP on 02/26/2021   08/27/2021 Imaging   EXAM: CT ABDOMEN AND PELVIS WITH CONTRAST  IMPRESSION: 1. No new or progressive interval findings. 2. Stable appearance of the 17 mm nodule central mesenteric nodule  seen to be hypermetabolic on recent PET-CT. The second mesenteric lesion identified on that study is not discernible today. 3. Stable 1.5 cm subcapsular low-density lesion posterior right liver since PET-CT 02/16/2021. 4. 2 small bowel nodules are identified on imaging today although 7 discrete hypermetabolic small bowel lesions were seen on previous PET-CT. 5. Left colonic diverticulosis without diverticulitis. 6. Aortic Atherosclerosis (ICD10-I70.0).   02/22/2022 Imaging   EXAM: CT ABDOMEN AND PELVIS WITH CONTRAST  IMPRESSION: 1. Mural soft tissue nodule of the small bowel in the midline ventral abdomen is unchanged, as is a small adjacent metastatic mesenteric nodule. 2. No evidence of new metastatic disease in the abdomen or pelvis. 3. Diverticulosis without evidence of acute diverticulitis. 4. Coronary artery disease.      INTERVAL HISTORY:  Tanya Walsh is here for a follow up of  metastatic neuroendocrine tumor She was last seen by NP [name] on 09/07/2022 She presents to the clinic alone. Pt report of having a injection in he back. Pt state that her stomach issues are the same pain she gets off and on. Pt state she takes Tylenol and some Ibuprofen for pain. Pt reports of some SOB. Pt reports of having diarrhea some times. Pt state she went to the Neurologist and they found a small Aneurysm.    All other systems were reviewed with the patient and are negative.  MEDICAL HISTORY:  Past Medical History:  Diagnosis Date   Coronary artery disease    Depression    GI bleed 07/17/2015   Hypertension    Myocardial infarction [facility])    Nocturia 08/14/2019   Refusal of blood transfusions as patient is Jehovah's Witness     SURGICAL HISTORY: Past Surgical  History:  Procedure Laterality Date   ABDOMINAL HYSTERECTOMY     BACK SURGERY     BIOPSY  12/10/2020   Procedure: BIOPSY;  Surgeon: Ronnette Juniper, MD;  Location: [facility] ENDOSCOPY;  Service: Gastroenterology;;   BIOPSY  12/11/2020    Procedure: BIOPSY;  Surgeon: Ronnette Juniper, MD;  Location: [facility] ENDOSCOPY;  Service: Gastroenterology;;   COLONOSCOPY WITH PROPOFOL N/A 12/11/2020   Procedure: COLONOSCOPY WITH PROPOFOL;  Surgeon: Ronnette Juniper, MD;  Location: [place];  Service: Gastroenterology;  Laterality: N/A;   ESOPHAGOGASTRODUODENOSCOPY N/A 07/17/2015   Procedure: ESOPHAGOGASTRODUODENOSCOPY (EGD);  Surgeon: Wonda Horner, MD;  Location: [facility] ENDOSCOPY;  Service: Endoscopy;  Laterality: N/A;   ESOPHAGOGASTRODUODENOSCOPY (EGD) WITH PROPOFOL N/A 12/10/2020   Procedure: ESOPHAGOGASTRODUODENOSCOPY (EGD) WITH PROPOFOL;  Surgeon: Ronnette Juniper, MD;  Location: [place];  Service: Gastroenterology;  Laterality: N/A;   HEMOSTASIS CLIP PLACEMENT  12/10/2020   Procedure: HEMOSTASIS CLIP PLACEMENT;  Surgeon: Ronnette Juniper, MD;  Location: [place];  Service: Gastroenterology;;    I have reviewed the social history and family history with the patient and they are unchanged from previous note.  ALLERGIES:  has No Known Allergies.  MEDICATIONS:  Current Outpatient Medications  Medication Sig Dispense Refill   cyclobenzaprine (FLEXERIL) 5 MG tablet Take 1 tablet (5 mg total) by mouth 3 (three) times daily as needed for muscle spasms. 30 tablet 0   DULoxetine (CYMBALTA) 60 MG capsule Take 60 mg by mouth daily.     fluticasone (FLONASE) 50 MCG/ACT nasal spray Place 1 spray into both nostrils daily.     ketorolac (TORADOL) 60 MG/2ML SOLN injection Inject 60 mg into the muscle once.     losartan-hydrochlorothiazide (HYZAAR) 50-12.5 MG tablet Take 1 tablet by mouth daily.     metFORMIN (GLUCOPHAGE-XR) 500 MG 24 hr tablet Take 500 mg by mouth daily with breakfast.     Multiple Vitamins-Minerals (MULTIVITAMIN ADULTS 50+) TABS Take 1 tablet by mouth daily. (Patient not taking: Reported on 09/29/2022)     nitroGLYCERIN (NITROSTAT) 0.4 MG SL tablet Place 1 tablet (0.4 mg total) under the tongue every 5 (five) minutes as needed for chest pain. 30  tablet 0   pantoprazole (PROTONIX) 40 MG tablet Take 40 mg by mouth daily.     rosuvastatin (CRESTOR) 20 MG tablet Take 20 mg by mouth daily.     topiramate (TOPAMAX) 25 MG tablet Take 1 tablet (25 mg total) by mouth at bedtime. 30 tablet 5   traMADol (ULTRAM) 50 MG tablet Take 1 tablet (50 mg total) by mouth every 12 (twelve) hours as needed. 30 tablet 0   trolamine salicylate (ASPERCREME) 10 % cream Apply 1 application topically as needed for muscle pain.     No current facility-administered medications for this visit.   Facility-Administered Medications Ordered in Other Visits  Medication Dose Route Frequency Provider Last Rate Last Admin   octreotide (SANDOSTATIN LAR) IM injection 30 mg  30 mg Intramuscular Once Truitt Merle, MD        PHYSICAL EXAMINATION: ECOG PERFORMANCE STATUS: 1 - Symptomatic but completely ambulatory  Vitals:   11/25/22 1038  BP: (!) 140/92  Pulse: 73  Resp: 18  Temp: 98 F (36.7 C)  SpO2: 100%   Wt Readings from Last 3 Encounters:  11/25/22 192 lb 8 oz (87.3 kg)  09/29/22 189 lb 14.4 oz (86.1 kg)  08/29/22 189 lb (85.7 kg)     GENERAL:alert, no distress and comfortable SKIN: skin color normal, no rashes or significant lesions EYES:  normal, Conjunctiva are pink and non-injected, sclera clear  NEURO: alert & oriented x 3 with fluent speech   LABORATORY DATA:  I have reviewed the data as listed    Latest Ref Rng & Units 11/25/2022   10:13 AM 08/29/2022    8:49 PM 08/29/2022    8:42 PM  CBC  WBC 4.0 - 10.5 K/uL 5.0   6.3   Hemoglobin 12.0 - 15.0 g/dL 13.6  14.6  13.6   Hematocrit 36.0 - 46.0 % 41.0  43.0  40.9   Platelets 150 - 400 K/uL 302   340         Latest Ref Rng & Units 11/25/2022   10:13 AM 08/30/2022    1:41 AM 08/29/2022    8:49 PM  CMP  Glucose 70 - 99 mg/dL 143  155  165   BUN 8 - 23 mg/dL '11  9  16   '$ Creatinine 0.44 - 1.00 mg/dL 0.95  0.84  0.80   Sodium 135 - 145 mmol/L 140  137  135   Potassium 3.5 - 5.1 mmol/L 3.8  3.7   4.8   Chloride 98 - 111 mmol/L 102  105  98   CO2 22 - 32 mmol/L 34  25    Calcium 8.9 - 10.3 mg/dL 9.2  8.3    Total Protein 6.5 - 8.1 g/dL 6.9  6.1    Total Bilirubin 0.3 - 1.2 mg/dL 0.6  0.6    Alkaline Phos 38 - 126 U/L 55  59    AST 15 - 41 U/L 19  21    ALT 0 - 44 U/L 23  14        RADIOGRAPHIC STUDIES: I have personally reviewed the radiological images as listed and agreed with the findings in the report. No results found.    Orders Placed This Encounter  Procedures   CT ABDOMEN PELVIS W CONTRAST    Standing Status:   Future    Standing Expiration Date:   11/25/2023    Order Specific Question:   If indicated for the ordered procedure, I authorize the administration of contrast media per Radiology protocol    Answer:   Yes    Order Specific Question:   Preferred imaging location?    Answer:   [facility]    Order Specific Question:   Is Oral Contrast requested for this exam?    Answer:   Yes, Per Radiology protocol   All questions were answered. The patient knows to call the clinic with any problems, questions or concerns. No barriers to learning was detected. The total time spent in the appointment was 25 minutes.     Truitt Merle, MD 11/25/2022   Felicity Coyer, CMA, am acting as scribe for Truitt Merle, MD.   I have reviewed the above documentation for accuracy and completeness, and I agree with the above.

## 2022-11-25 ENCOUNTER — Inpatient Hospital Stay: Payer: Medicare Other

## 2022-11-25 ENCOUNTER — Inpatient Hospital Stay: Payer: Medicare Other | Attending: Hematology | Admitting: Hematology

## 2022-11-25 ENCOUNTER — Other Ambulatory Visit: Payer: Self-pay

## 2022-11-25 ENCOUNTER — Encounter: Payer: Self-pay | Admitting: Hematology

## 2022-11-25 VITALS — BP 140/92 | HR 73 | Temp 98.0°F | Resp 18 | Ht 63.0 in | Wt 192.5 lb

## 2022-11-25 DIAGNOSIS — C7A8 Other malignant neuroendocrine tumors: Secondary | ICD-10-CM

## 2022-11-25 DIAGNOSIS — C7B8 Other secondary neuroendocrine tumors: Secondary | ICD-10-CM | POA: Diagnosis present

## 2022-11-25 DIAGNOSIS — C7A019 Malignant carcinoid tumor of the small intestine, unspecified portion: Secondary | ICD-10-CM

## 2022-11-25 LAB — CBC WITH DIFFERENTIAL/PLATELET
Abs Immature Granulocytes: 0.02 10*3/uL (ref 0.00–0.07)
Basophils Absolute: 0 10*3/uL (ref 0.0–0.1)
Basophils Relative: 1 %
Eosinophils Absolute: 0.1 10*3/uL (ref 0.0–0.5)
Eosinophils Relative: 3 %
HCT: 41 % (ref 36.0–46.0)
Hemoglobin: 13.6 g/dL (ref 12.0–15.0)
Immature Granulocytes: 0 %
Lymphocytes Relative: 33 %
Lymphs Abs: 1.7 10*3/uL (ref 0.7–4.0)
MCH: 29.4 pg (ref 26.0–34.0)
MCHC: 33.2 g/dL (ref 30.0–36.0)
MCV: 88.6 fL (ref 80.0–100.0)
Monocytes Absolute: 0.4 10*3/uL (ref 0.1–1.0)
Monocytes Relative: 9 %
Neutro Abs: 2.7 10*3/uL (ref 1.7–7.7)
Neutrophils Relative %: 54 %
Platelets: 302 10*3/uL (ref 150–400)
RBC: 4.63 MIL/uL (ref 3.87–5.11)
RDW: 12.7 % (ref 11.5–15.5)
WBC: 5 10*3/uL (ref 4.0–10.5)
nRBC: 0 % (ref 0.0–0.2)

## 2022-11-25 LAB — COMPREHENSIVE METABOLIC PANEL
ALT: 23 U/L (ref 0–44)
AST: 19 U/L (ref 15–41)
Albumin: 4 g/dL (ref 3.5–5.0)
Alkaline Phosphatase: 55 U/L (ref 38–126)
Anion gap: 4 — ABNORMAL LOW (ref 5–15)
BUN: 11 mg/dL (ref 8–23)
CO2: 34 mmol/L — ABNORMAL HIGH (ref 22–32)
Calcium: 9.2 mg/dL (ref 8.9–10.3)
Chloride: 102 mmol/L (ref 98–111)
Creatinine, Ser: 0.95 mg/dL (ref 0.44–1.00)
GFR, Estimated: >60 mL/min (ref >60)
Glucose, Bld: 143 mg/dL — ABNORMAL HIGH (ref 70–99)
Potassium: 3.8 mmol/L (ref 3.5–5.1)
Sodium: 140 mmol/L (ref 135–145)
Total Bilirubin: 0.6 mg/dL (ref 0.3–1.2)
Total Protein: 6.9 g/dL (ref 6.5–8.1)

## 2022-11-25 MED ORDER — OCTREOTIDE ACETATE 30 MG IM KIT
30.0000 mg | PACK | Freq: Once | INTRAMUSCULAR | Status: AC
  Administered 2022-11-25: 30 mg via INTRAMUSCULAR
  Filled 2022-11-25: qty 1

## 2022-11-25 NOTE — Assessment & Plan Note (Signed)
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  Liver biopsy was attempted, but the liver lesion was not visible on ultrasound. -She started Sandostatin injection in June 2022.  Overall tolerating well. Will continue.  -last restaging CT on 09/07/2022 showed stable disease

## 2022-11-30 LAB — CHROMOGRANIN A: Chromogranin A (ng/mL): 34.5 ng/mL (ref 0.0–101.8)

## 2022-12-10 ENCOUNTER — Other Ambulatory Visit: Payer: Self-pay | Admitting: Family Medicine

## 2022-12-10 DIAGNOSIS — Z1231 Encounter for screening mammogram for malignant neoplasm of breast: Secondary | ICD-10-CM

## 2022-12-14 ENCOUNTER — Ambulatory Visit
Admission: RE | Admit: 2022-12-14 | Discharge: 2022-12-14 | Disposition: A | Discharge status: Home / Self Care | Payer: Medicare Other | Source: Ambulatory Visit | Attending: Family Medicine | Admitting: Family Medicine

## 2022-12-14 DIAGNOSIS — Z1231 Encounter for screening mammogram for malignant neoplasm of breast: Secondary | ICD-10-CM

## 2022-12-15 ENCOUNTER — Telehealth: Payer: Self-pay | Admitting: Hematology

## 2022-12-15 NOTE — Telephone Encounter (Signed)
Patient called to correct duplicate injection appointment. Corrected appointments and patient notified.

## 2022-12-21 ENCOUNTER — Other Ambulatory Visit: Payer: Self-pay

## 2022-12-23 ENCOUNTER — Inpatient Hospital Stay: Payer: Medicare Other | Attending: Hematology

## 2022-12-23 VITALS — BP 146/91 | HR 65 | Temp 98.7°F | Resp 18

## 2022-12-23 DIAGNOSIS — C7B8 Other secondary neuroendocrine tumors: Secondary | ICD-10-CM | POA: Insufficient documentation

## 2022-12-23 DIAGNOSIS — C7A8 Other malignant neuroendocrine tumors: Secondary | ICD-10-CM | POA: Insufficient documentation

## 2022-12-23 MED ORDER — OCTREOTIDE ACETATE 30 MG IM KIT
30.0000 mg | PACK | Freq: Once | INTRAMUSCULAR | Status: AC
  Administered 2022-12-23: 30 mg via INTRAMUSCULAR
  Filled 2022-12-23: qty 1

## 2022-12-23 NOTE — Patient Instructions (Signed)
Octreotide Injection Solution What is this medication? OCTREOTIDE (ok TREE oh tide) treats high levels of growth hormone (acromegaly). It works by reducing the amount of growth hormone your body makes. This reduces symptoms and the risk of health problems caused by too much growth hormone, such as diabetes and heart disease. It may also be used to treat diarrhea caused by neuroendocrine tumors. It works by slowing down the release of serotonin from the tumor cells. This reduces the number of bowel movements you have. This medicine may be used for other purposes; ask your health care provider or pharmacist if you have questions. COMMON BRAND NAME(S): Bynfezia, Sandostatin What should I tell my care team before I take this medication? They need to know if you have any of these conditions: Diabetes Gallbladder disease Kidney disease Liver disease Thyroid disease An unusual or allergic reaction to octreotide, other medications, foods, dyes, or preservatives Pregnant or trying to get pregnant Breast-feeding How should I use this medication? This medication is injected under the skin or into a vein. It is usually given by your care team in a hospital or clinic setting. If you get this medication at home, you will be taught how to prepare and give it. Use exactly as directed. Take it as directed on the prescription label at the same time every day. Keep taking it unless your care team tells you to stop. Allow the injection solution to come to room temperature before use. Do not warm it artificially. It is important that you put your used needles and syringes in a special sharps container. Do not put them in a trash can. If you do not have a sharps container, call your pharmacist or care team to get one. Talk to your care team about the use of this medication in children. Special care may be needed. Overdosage: If you think you have taken too much of this medicine contact a poison control center or  emergency room at once. NOTE: This medicine is only for you. Do not share this medicine with others. What if I miss a dose? If you miss a dose, take it as soon as you can. If it is almost time for your next dose, take only that dose. Do not take double or extra doses. What may interact with this medication? Bromocriptine Certain medications for blood pressure, heart disease, irregular heartbeat Cyclosporine Diuretics Medications for diabetes, including insulin Quinidine This list may not describe all possible interactions. Give your health care provider a list of all the medicines, herbs, non-prescription drugs, or dietary supplements you use. Also tell them if you smoke, drink alcohol, or use illegal drugs. Some items may interact with your medicine. What should I watch for while using this medication? Visit your care team for regular checks on your progress. Tell your care team if your symptoms do not start to get better or if they get worse. To help reduce irritation at the injection site, use a different site for each injection and make sure the solution is at room temperature before use. This medication may cause decreases in blood sugar. Signs of low blood sugar include chills, cool, pale skin or cold sweats, drowsiness, extreme hunger, fast heartbeat, headache, nausea, nervousness or anxiety, shakiness, trembling, unsteadiness, tiredness, or weakness. Contact your care team right away if you experience any of these symptoms. This medication may increase blood sugar. The risk may be higher in patients who already have diabetes. Ask your care team what you can do to lower your   risk of diabetes while taking this medication. You should make sure you get enough vitamin B12 while you are taking this medication. Discuss the foods you eat and the vitamins you take with your care team. What side effects may I notice from receiving this medication? Side effects that you should report to your care  team as soon as possible: Allergic reactions--skin rash, itching, hives, swelling of the face, lips, tongue, or throat Gallbladder problems--severe stomach pain, nausea, vomiting, fever Heart rhythm changes--fast or irregular heartbeat, dizziness, feeling faint or lightheaded, chest pain, trouble breathing High blood sugar (hyperglycemia)--increased thirst or amount of urine, unusual weakness or fatigue, blurry vision Low blood sugar (hypoglycemia)--tremors or shaking, anxiety, sweating, cold or clammy skin, confusion, dizziness, rapid heartbeat Low thyroid levels (hypothyroidism)--unusual weakness or fatigue, increased sensitivity to cold, constipation, hair loss, dry skin, weight gain, feelings of depression Low vitamin B12 level--pain, tingling, or numbness in the hands or feet, muscle weakness, dizziness, confusion, trouble concentrating Pancreatitis--severe stomach pain that spreads to your back or gets worse after eating or when touched, fever, nausea, vomiting Side effects that usually do not require medical attention (report to your care team if they continue or are bothersome): Diarrhea Dizziness Gas Headache Pain, redness, or irritation at injection site Stomach pain This list may not describe all possible side effects. Call your doctor for medical advice about side effects. You may report side effects to FDA at 1-800-FDA-1088. Where should I keep my medication? Keep out of the reach of children and pets. Store in the refrigerator. Protect from light. Allow to come to room temperature naturally. Do not use artificial heat. If protected from light, the injection may be stored between 20 and 30 degrees C (70 and 86 degrees F) for 14 days. After the initial use, throw away any unused portion of a multiple dose vial after 14 days. Get rid of any unused portions of the ampules after use. To get rid of medications that are no longer needed or have expired: Take the medication to a medication  take-back program. Ask your pharmacy or law enforcement to find a location. If you cannot return the medication, ask your pharmacist or care team how to get rid of the medication safely. NOTE: This sheet is a summary. It may not cover all possible information. If you have questions about this medicine, talk to your doctor, pharmacist, or health care provider.  2023 Elsevier/Gold Standard (2021-12-02 00:00:00)  

## 2023-01-18 ENCOUNTER — Telehealth: Payer: Self-pay | Admitting: Hematology

## 2023-01-20 ENCOUNTER — Ambulatory Visit: Payer: Medicare Other

## 2023-01-20 ENCOUNTER — Inpatient Hospital Stay: Payer: Medicare Other

## 2023-02-03 ENCOUNTER — Other Ambulatory Visit: Payer: Self-pay

## 2023-02-03 ENCOUNTER — Inpatient Hospital Stay (HOSPITAL_BASED_OUTPATIENT_CLINIC_OR_DEPARTMENT_OTHER): Payer: Medicare Other | Admitting: Hematology

## 2023-02-03 ENCOUNTER — Encounter: Payer: Self-pay | Admitting: Hematology

## 2023-02-03 ENCOUNTER — Inpatient Hospital Stay: Payer: Medicare Other | Attending: Hematology

## 2023-02-03 VITALS — BP 122/89 | HR 69 | Temp 98.5°F | Resp 18

## 2023-02-03 VITALS — BP 136/82 | HR 68 | Temp 98.5°F | Resp 18 | Ht 63.0 in | Wt 187.3 lb

## 2023-02-03 DIAGNOSIS — C7A019 Malignant carcinoid tumor of the small intestine, unspecified portion: Secondary | ICD-10-CM

## 2023-02-03 DIAGNOSIS — C7A8 Other malignant neuroendocrine tumors: Secondary | ICD-10-CM

## 2023-02-03 DIAGNOSIS — C7B8 Other secondary neuroendocrine tumors: Secondary | ICD-10-CM | POA: Insufficient documentation

## 2023-02-03 MED ORDER — OCTREOTIDE ACETATE 30 MG IM KIT
30.0000 mg | PACK | Freq: Once | INTRAMUSCULAR | Status: AC
  Administered 2023-02-03: 30 mg via INTRAMUSCULAR
  Filled 2023-02-03: qty 1

## 2023-02-03 NOTE — Progress Notes (Signed)
[facility]   [phone] [phone]   Clinic Follow up Note   Patient Care Team: Macy Mis, MD as PCP - General (Family Medicine) Quintella Reichert, MD as PCP - Sleep Medicine (Cardiology) Lyn Records, MD (Inactive) as PCP - Cardiology (Cardiology) Drema Dallas, DO as Consulting Physician (Neurology) Pollyann Samples, NP as Nurse Practitioner (Nurse Practitioner) Malachy Mood, MD as Consulting Physician (Oncology) Radonna Ricker, RN (Inactive) as Oncology Nurse Navigator Drema Dallas, DO as Consulting Physician (Neurology)  Date of Service:  02/03/2023  CHIEF COMPLAINT: f/u of  metastatic neuroendocrine tumor     CURRENT THERAPY:  Sandostatin injection q28 days, starting 03/12/22      ASSESSMENT: Tanya Walsh is a 67 y.o. female with   Chest pain -She describes intermittent front chest pain across her low chest, especially with empty stomach, not exertional. -Symptom is not typical for angina.  I suspect that this could be related to her acid reflux -She is on Protonix as needed, I recommended her to stop taking 40 mg daily -If no improvement, I recommended her to see her GI doctor currently.   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  Liver biopsy was attempted, but the liver lesion was not visible on ultrasound. -She started Sandostatin injection in June 2022.  Overall tolerating well. Will continue.  -last restaging CT on 09/07/2022 showed stable disease  -She is clinically doing well overall, still has intermittent pain on the left side of abdomen, overall better than last year. -She has developed hyperglycemia, possibly related to Sandostatin injection, she is on metformin now.  Discussed diabetic diet, and I encouraged her to exercise. -Will repeat CT scan every 6 months   Urinary  urgency -She reports urinary urgency for the past week, no significant dysuria, no hematuria -I ordered a UA and a urine culture for her, was not able to give [place] a urine sample today.  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 Kerin Salen Impression - Normal upper third of esophagus, middle third of esophagus and lower third of esophagus. - Z-line regular, 38 cm from the incisors. - Erythematous mucosa in the antrum. Biopsied. Clip (MR conditional) was placed. - Normal examined duodenum. Biopsied.   12/10/2020 Pathology Results   FINAL MICROSCOPIC DIAGNOSIS:   A. DUODENUM, BIOPSY:  - Duodenal mucosa with no significant pathologic findings.  - Negative for increased intraepithelial lymphocytes and villous  architectural changes.   B. STOMACH, ANTRUM, BIOPSY:  - Reactive gastropathy.  - Warthin-Starry stain is negative for Helicobacter pylori.    12/11/2020 Procedure   Colonoscopy, Dr. Kerin Salen impression - One 5 mm polyp in the transverse colon, removed piecemeal using a cold biopsy forceps. Resected and retrieved. - Diverticulosis in the sigmoid colon, in the descending colon and in the transverse colon. -  The examined portion of the ileum was normal. - Non-bleeding internal hemorrhoids.   12/11/2020 Pathology Results   FINAL MICROSCOPIC DIAGNOSIS:   A. COLON, TRANSVERSE, POLYPECTOMY:  - Tubular adenoma.   - Negative for high grade dysplasia.    12/24/2020 Procedure   Capsule endoscopy report: A polypoid nodular growth was noted at 3 hours and 30 minutes and an ulcerated, polypoid growth was noted at 3 hours and 54 minutes which is likely the cause of obscure GI blood loss   01/23/2021 Imaging   CT entero AP w contrast IMPRESSION: 1. No acute findings identified within the abdomen or pelvis. 2. There are several focal areas of intraluminal hyperenhancement within the small bowel loops. The largest is in the nondilated mid to distal jejunum measuring 1.3 cm. Cannot rule out underlying small bowel neoplasm. Correlation with tissue sampling results advised. Correlation with results from capsule endoscopy. 3. There is a enhancing soft tissue nodule with central calcification within the central small bowel mesentery. This is nonspecific and may represent sequelae of inflammation/infection. Primary differential considerations include carcinoid tumor or metastatic adenopathy. If there is a clinical concern for carcinoid tumor consider further investigation with DOTATATE PET. 4. Left adrenal gland adenoma. 5. Distal colonic diverticulosis without signs of acute diverticulitis. 6. Aortic atherosclerosis.   02/16/2021 PET scan   IMPRESSION: 1. Evidence of well differentiated small bowel neuroendocrine tumor with mesenteric metastasis. 2. Approximately 7 discrete small bowel lesions with intense radiotracer activity. Lesion depicted on comparison CT enterography. 3. Two intensely radiotracer avid mesenteric implants centrally within the small bowel mesentery. 4. Single hepatic metastasis with a second potential hepatic metastasis versus peritoneal implant. 5. No small bowel obstruction. 6. No evidence of metastatic disease outside the abdomen pelvis. 7. Patient may be a candidate for peptide receptor radiotherapy (Lu-177 DOTATATE) available at [facility] molecular imaging department  [phone]).   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 No new or progressive interval findings. 2. Stable appearance of the 17 mm nodule central mesenteric nodule seen to be hypermetabolic on recent PET-CT. The second mesenteric lesion identified on that study is not discernible today. 3. Stable 1.5 cm subcapsular low-density lesion posterior right liver since PET-CT 02/16/2021. 4. 2 small bowel nodules are identified on imaging today although 7 discrete hypermetabolic small bowel lesions were seen on previous PET-CT. 5. Left colonic diverticulosis without diverticulitis. 6. Aortic Atherosclerosis (ICD10-I70.0).   02/22/2022 Imaging   EXAM: CT ABDOMEN AND PELVIS WITH CONTRAST  IMPRESSION: 1. Mural soft tissue nodule of the small bowel in the midline ventral abdomen is unchanged, as is a small adjacent metastatic mesenteric nodule. 2. No evidence of new metastatic disease in the abdomen or pelvis. 3. Diverticulosis without evidence of acute diverticulitis. 4. Coronary artery disease.      INTERVAL HISTORY:  Tanya Walsh is here for a follow up of  metastatic neuroendocrine tumor . She was last seen by me on 11/25/2022. She presents to the clinic alone. Pt state that she gets a burning sensation in her chest. Pain she states its hard to explain and hit shoots across the chest. The pain happen 3-4 days out  the week. Pt take pantoprazole to help with the pain.Pt state that she has lost some weight and she is on metformin. Pt state that she has some bladder issues and she has the urge to go but she sits  for a long time before she release. Pt denies having fever. Pt denies having any issues with the Sandostatin injection.    All other systems were reviewed with the patient and are negative.  MEDICAL HISTORY:  Past Medical History:  Diagnosis Date   Coronary artery disease    Depression    GI bleed 07/17/2015   Hypertension    Myocardial infarction [facility])    Nocturia 08/14/2019   Refusal of blood transfusions as patient is Jehovah's Witness     SURGICAL HISTORY: Past Surgical History:  Procedure Laterality Date   ABDOMINAL HYSTERECTOMY     BACK SURGERY     BIOPSY  12/10/2020   Procedure: BIOPSY;  Surgeon: Kerin Salen, MD;  Location: [facility] ENDOSCOPY;  Service: Gastroenterology;;   BIOPSY  12/11/2020   Procedure: BIOPSY;  Surgeon: Kerin Salen, MD;  Location: [facility] ENDOSCOPY;  Service: Gastroenterology;;   COLONOSCOPY WITH PROPOFOL N/A 12/11/2020   Procedure: COLONOSCOPY WITH PROPOFOL;  Surgeon: Kerin Salen, MD;  Location: [facility] ENDOSCOPY;  Service: Gastroenterology;  Laterality: N/A;   ESOPHAGOGASTRODUODENOSCOPY N/A 07/17/2015   Procedure: ESOPHAGOGASTRODUODENOSCOPY (EGD);  Surgeon: Graylin Shiver, MD;  Location[facility] ENDOSCOPY;  Service: Endoscopy;  Laterality: N/A;   ESOPHAGOGASTRODUODENOSCOPY (EGD) WITH PROPOFOL N/A 12/10/2020   Procedure: ESOPHAGOGASTRODUODENOSCOPY (EGD) WITH PROPOFOL;  Surgeon: Kerin Salen, MD;  Location: [facility] ENDOSCOPY;  Service: Gastroenterology;  Laterality: N/A;   HEMOSTASIS CLIP PLACEMENT  12/10/2020   Procedure: HEMOSTASIS CLIP PLACEMENT;  Surgeon: Kerin Salen, MD;  Location: [facility] ENDOSCOPY;  Service: Gastroenterology;;    I have reviewed the social history and family history with the patient and they are unchanged from previous note.  ALLERGIES:  has No Known Allergies.  MEDICATIONS:  Current Outpatient Medications  Medication Sig Dispense Refill   cyclobenzaprine (FLEXERIL) 5 MG tablet Take 1 tablet (5 mg total) by mouth 3 (three) times daily as needed for muscle  spasms. 30 tablet 0   DULoxetine (CYMBALTA) 60 MG capsule Take 60 mg by mouth daily.     fluticasone (FLONASE) 50 MCG/ACT nasal spray Place 1 spray into both nostrils daily.     ketorolac (TORADOL) 60 MG/2ML SOLN injection Inject 60 mg into the muscle once.     losartan-hydrochlorothiazide (HYZAAR) 50-12.5 MG tablet Take 1 tablet by mouth daily.     metFORMIN (GLUCOPHAGE-XR) 500 MG 24 hr tablet Take 500 mg by mouth daily with breakfast.     Multiple Vitamins-Minerals (MULTIVITAMIN ADULTS 50+) TABS Take 1 tablet by mouth daily. (Patient not taking: Reported on 09/29/2022)     nitroGLYCERIN (NITROSTAT) 0.4 MG SL tablet Place 1 tablet (0.4 mg total) under the tongue every 5 (five) minutes as needed for chest pain. 30 tablet 0   pantoprazole (PROTONIX) 40 MG tablet Take 40 mg by mouth daily.     rosuvastatin (CRESTOR) 20 MG tablet Take 20 mg by mouth daily.     traMADol (ULTRAM) 50 MG tablet Take 1 tablet (50 mg total) by mouth every 12 (twelve) hours as needed. 30 tablet 0   trolamine salicylate (ASPERCREME) 10 % cream Apply 1 application topically as needed for muscle pain.     No current facility-administered medications for this visit.    PHYSICAL EXAMINATION: ECOG PERFORMANCE STATUS: 1 - Symptomatic but completely ambulatory  Vitals:   02/03/23 1247  BP: 136/82  Pulse: 68  Resp: 18  Temp: 98.5 F (36.9 C)  SpO2: 98%   Wt Readings from Last 3 Encounters:  02/03/23 187 lb 4.8 oz (85 kg)  11/25/22 192 lb 8 oz (87.3 kg)  09/29/22 189 lb 14.4 oz (86.1 kg)     GENERAL:alert, no distress and comfortable SKIN: skin color normal, no rashes or significant lesions EYES: normal, Conjunctiva are pink and non-injected, sclera clear  NEURO: alert & oriented x 3 with fluent speech LUNGS: (-) clear to auscultation and percussion with normal breathing effort HEART: (-)regular rate & rhythm and no murmurs and no lower extremity edema   LABORATORY DATA:  I have reviewed the data as listed     Latest Ref Rng & Units 11/25/2022   10:13 AM 08/29/2022    8:49 PM 08/29/2022    8:42 PM  CBC  WBC 4.0 - 10.5 K/uL 5.0   6.3   Hemoglobin 12.0 - 15.0 g/dL 46.9  62.9  52.8   Hematocrit 36.0 - 46.0 % 41.0  43.0  40.9   Platelets 150 - 400 K/uL 302   340         Latest Ref Rng & Units 11/25/2022   10:13 AM 08/30/2022    1:41 AM 08/29/2022    8:49 PM  CMP  Glucose 70 - 99 mg/dL 413  244  010   BUN 8 - 23 mg/dL 11  9  16    Creatinine 0.44 - 1.00 mg/dL 2.72  5.36  6.44   Sodium 135 - 145 mmol/L 140  137  135   Potassium 3.5 - 5.1 mmol/L 3.8  3.7  4.8   Chloride 98 - 111 mmol/L 102  105  98   CO2 22 - 32 mmol/L 34  25    Calcium 8.9 - 10.3 mg/dL 9.2  8.3    Total Protein 6.5 - 8.1 g/dL 6.9  6.1    Total Bilirubin 0.3 - 1.2 mg/dL 0.6  0.6    Alkaline Phos 38 - 126 U/L 55  59    AST 15 - 41 U/L 19  21    ALT 0 - 44 U/L 23  14        RADIOGRAPHIC STUDIES: I have personally reviewed the radiological images as listed and agreed with the findings in the report. No results found.    No orders of the defined types were placed in this encounter.  All questions were answered. The patient knows to call the clinic with any problems, questions or concerns. No barriers to learning was detected. The total time spent in the appointment was 20 minutes.     Malachy Mood, MD 02/03/2023   Carolin Coy, CMA, am acting as scribe for Malachy Mood, MD.   I have reviewed the above documentation for accuracy and completeness, and I agree with the above.

## 2023-02-16 ENCOUNTER — Ambulatory Visit: Payer: Medicare Other | Admitting: Neurology

## 2023-02-17 ENCOUNTER — Ambulatory Visit: Payer: Medicare Other | Admitting: Hematology

## 2023-02-17 ENCOUNTER — Other Ambulatory Visit: Payer: Medicare Other

## 2023-02-17 ENCOUNTER — Ambulatory Visit: Payer: Medicare Other

## 2023-02-23 ENCOUNTER — Ambulatory Visit (HOSPITAL_COMMUNITY)
Admission: RE | Admit: 2023-02-23 | Discharge: 2023-02-23 | Disposition: A | Discharge status: Home / Self Care | Payer: Medicare Other | Source: Ambulatory Visit | Attending: Hematology | Admitting: Hematology

## 2023-02-23 DIAGNOSIS — C7A8 Other malignant neuroendocrine tumors: Secondary | ICD-10-CM | POA: Diagnosis present

## 2023-02-23 MED ORDER — IOHEXOL 300 MG/ML  SOLN
100.0000 mL | Freq: Once | INTRAMUSCULAR | Status: AC | PRN
  Administered 2023-02-23: 100 mL via INTRAVENOUS

## 2023-03-03 ENCOUNTER — Encounter: Payer: Self-pay | Admitting: Hematology

## 2023-03-03 ENCOUNTER — Inpatient Hospital Stay (HOSPITAL_BASED_OUTPATIENT_CLINIC_OR_DEPARTMENT_OTHER): Payer: Medicare Other | Admitting: Hematology

## 2023-03-03 ENCOUNTER — Other Ambulatory Visit: Payer: Self-pay

## 2023-03-03 ENCOUNTER — Inpatient Hospital Stay: Payer: Medicare Other | Attending: Hematology

## 2023-03-03 ENCOUNTER — Inpatient Hospital Stay: Payer: Medicare Other

## 2023-03-03 VITALS — BP 148/89 | HR 69 | Temp 98.6°F | Resp 18 | Ht 63.0 in | Wt 185.0 lb

## 2023-03-03 DIAGNOSIS — C7B8 Other secondary neuroendocrine tumors: Secondary | ICD-10-CM | POA: Diagnosis present

## 2023-03-03 DIAGNOSIS — Z79899 Other long term (current) drug therapy: Secondary | ICD-10-CM | POA: Diagnosis not present

## 2023-03-03 DIAGNOSIS — C7A8 Other malignant neuroendocrine tumors: Secondary | ICD-10-CM

## 2023-03-03 DIAGNOSIS — C7A019 Malignant carcinoid tumor of the small intestine, unspecified portion: Secondary | ICD-10-CM

## 2023-03-03 LAB — COMPREHENSIVE METABOLIC PANEL
ALT: 17 U/L (ref 0–44)
AST: 19 U/L (ref 15–41)
Albumin: 3.8 g/dL (ref 3.5–5.0)
Alkaline Phosphatase: 43 U/L (ref 38–126)
Anion gap: 4 — ABNORMAL LOW (ref 5–15)
BUN: 7 mg/dL — ABNORMAL LOW (ref 8–23)
CO2: 34 mmol/L — ABNORMAL HIGH (ref 22–32)
Calcium: 9.7 mg/dL (ref 8.9–10.3)
Chloride: 103 mmol/L (ref 98–111)
Creatinine, Ser: 0.94 mg/dL (ref 0.44–1.00)
GFR, Estimated: >60 mL/min (ref >60)
Glucose, Bld: 109 mg/dL — ABNORMAL HIGH (ref 70–99)
Potassium: 3.9 mmol/L (ref 3.5–5.1)
Sodium: 141 mmol/L (ref 135–145)
Total Bilirubin: 0.5 mg/dL (ref 0.3–1.2)
Total Protein: 6.4 g/dL — ABNORMAL LOW (ref 6.5–8.1)

## 2023-03-03 LAB — CBC WITH DIFFERENTIAL/PLATELET
Abs Immature Granulocytes: 0.02 10*3/uL (ref 0.00–0.07)
Basophils Absolute: 0 10*3/uL (ref 0.0–0.1)
Basophils Relative: 1 %
Eosinophils Absolute: 0.2 10*3/uL (ref 0.0–0.5)
Eosinophils Relative: 3 %
HCT: 42.5 % (ref 36.0–46.0)
Hemoglobin: 13.7 g/dL (ref 12.0–15.0)
Immature Granulocytes: 0 %
Lymphocytes Relative: 34 %
Lymphs Abs: 2.1 10*3/uL (ref 0.7–4.0)
MCH: 29.5 pg (ref 26.0–34.0)
MCHC: 32.2 g/dL (ref 30.0–36.0)
MCV: 91.6 fL (ref 80.0–100.0)
Monocytes Absolute: 0.5 10*3/uL (ref 0.1–1.0)
Monocytes Relative: 9 %
Neutro Abs: 3.3 10*3/uL (ref 1.7–7.7)
Neutrophils Relative %: 53 %
Platelets: 305 10*3/uL (ref 150–400)
RBC: 4.64 MIL/uL (ref 3.87–5.11)
RDW: 13.1 % (ref 11.5–15.5)
WBC: 6.2 10*3/uL (ref 4.0–10.5)
nRBC: 0 % (ref 0.0–0.2)

## 2023-03-03 MED ORDER — OCTREOTIDE ACETATE 30 MG IM KIT
30.0000 mg | PACK | Freq: Once | INTRAMUSCULAR | Status: AC
  Administered 2023-03-03: 30 mg via INTRAMUSCULAR
  Filled 2023-03-03: qty 1

## 2023-03-03 MED ORDER — TRAMADOL HCL 50 MG PO TABS: 50.0000 mg | ORAL_TABLET | Freq: Two times a day (BID) | ORAL | 0 refills | Status: DC | PRN

## 2023-03-03 NOTE — Assessment & Plan Note (Signed)
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  Liver biopsy was attempted, but the liver lesion was not visible on ultrasound. -She started Sandostatin injection in June 2022.  Overall tolerating well. Will continue.  -last restaging CT on 09/07/2022 showed stable disease  -She is clinically doing well overall, still has intermittent pain on the left side of abdomen, overall better than last year. -She has developed hyperglycemia, possibly related to Sandostatin injection, she is on metformin now.  Discussed diabetic diet, and I encouraged her to exercise. -Restaging CT scan from February 23, 2023 showed stable disease, I personally reviewed her scan images with patient -Continue Sandostatin injection

## 2023-03-03 NOTE — Progress Notes (Signed)
[facility]   [phone] [phone]   Clinic Follow up Note   Patient Care Team: Macy Mis, MD as PCP - General (Family Medicine) Quintella Reichert, MD as PCP - Sleep Medicine (Cardiology) Lyn Records, MD (Inactive) as PCP - Cardiology (Cardiology) Drema Dallas, DO as Consulting Physician (Neurology) Pollyann Samples, NP as Nurse Practitioner (Nurse Practitioner) Malachy Mood, MD as Consulting Physician (Oncology) Radonna Ricker, RN (Inactive) as Oncology Nurse Navigator Drema Dallas, DO as Consulting Physician (Neurology)  Date of Service:  03/03/2023  CHIEF COMPLAINT: f/u of metastatic neuroendocrine tumor   CURRENT THERAPY:  Sandostatin injection q28 days, starting 03/12/22    ASSESSMENT:  Tanya Walsh is a 67 y.o. 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  Liver biopsy was attempted, but the liver lesion was not visible on ultrasound. -She started Sandostatin injection in June 2022.  Overall tolerating well. Will continue.  -last restaging CT on 09/07/2022 showed stable disease  -She is clinically doing well overall, still has intermittent pain on the left side of abdomen, overall better than last year. -She has developed hyperglycemia, possibly related to Sandostatin injection, she is on metformin now.  Discussed diabetic diet, and I encouraged her to exercise. -Restaging CT scan from February 23, 2023 showed stable disease, I personally reviewed her scan images with patient -Continue Sandostatin injection -Patient is clinically doing well, and tolerating Sandostatin injection well.    PLAN: -lab reviewed  - I prescribe Tramadol due to joint pain. 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 Kerin Salen Impression - Normal upper third of esophagus, middle third of esophagus and lower third of esophagus. - Z-line regular, 38 cm from the incisors. - Erythematous mucosa in the antrum. Biopsied. Clip (MR conditional) was placed. - Normal examined duodenum. Biopsied.   12/10/2020 Pathology Results   FINAL MICROSCOPIC DIAGNOSIS:   A. DUODENUM, BIOPSY:  - Duodenal mucosa with no significant pathologic findings.  - Negative for increased intraepithelial lymphocytes and villous  architectural changes.   B. STOMACH, ANTRUM, BIOPSY:  - Reactive gastropathy.  - Warthin-Starry stain is negative for Helicobacter pylori.    12/11/2020 Procedure   Colonoscopy, Dr. Kerin Salen impression - One 5 mm polyp in the transverse colon, removed piecemeal using a cold biopsy forceps. Resected and retrieved. - Diverticulosis in the sigmoid colon, in the descending colon and in the transverse colon. - The examined portion of the ileum was normal. - Non-bleeding internal hemorrhoids.   12/11/2020 Pathology Results   FINAL MICROSCOPIC DIAGNOSIS:   A. COLON, TRANSVERSE, POLYPECTOMY:  - Tubular adenoma.  - Negative for high grade dysplasia.    12/24/2020 Procedure   Capsule endoscopy report: A polypoid nodular growth was noted at 3 hours and 30 minutes and an ulcerated, polypoid growth was noted at 3 hours and 54 minutes which is likely the cause of obscure  GI blood loss   01/23/2021 Imaging   CT entero AP w contrast IMPRESSION: 1. No acute findings identified within the abdomen or  pelvis. 2. There are several focal areas of intraluminal hyperenhancement within the small bowel loops. The largest is in the nondilated mid to distal jejunum measuring 1.3 cm. Cannot rule out underlying small bowel neoplasm. Correlation with tissue sampling results advised. Correlation with results from capsule endoscopy. 3. There is a enhancing soft tissue nodule with central calcification within the central small bowel mesentery. This is nonspecific and may represent sequelae of inflammation/infection. Primary differential considerations include carcinoid tumor or metastatic adenopathy. If there is a clinical concern for carcinoid tumor consider further investigation with DOTATATE PET. 4. Left adrenal gland adenoma. 5. Distal colonic diverticulosis without signs of acute diverticulitis. 6. Aortic atherosclerosis.   02/16/2021 PET scan   IMPRESSION: 1. Evidence of well differentiated small bowel neuroendocrine tumor with mesenteric metastasis. 2. Approximately 7 discrete small bowel lesions with intense radiotracer activity. Lesion depicted on comparison CT enterography. 3. Two intensely radiotracer avid mesenteric implants centrally within the small bowel mesentery. 4. Single hepatic metastasis with a second potential hepatic metastasis versus peritoneal implant. 5. No small bowel obstruction. 6. No evidence of metastatic disease outside the abdomen pelvis. 7. Patient may be a candidate for peptide receptor radiotherapy (Lu-177 DOTATATE) available at [facility] molecular imaging department [id number]).   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 No new or progressive interval findings. 2. Stable appearance of the 17 mm nodule central mesenteric nodule seen to be hypermetabolic on recent PET-CT. The second mesenteric lesion identified on that study is not discernible today. 3. Stable 1.5 cm subcapsular low-density lesion posterior right liver since PET-CT 02/16/2021. 4. 2 small bowel nodules are identified on imaging today although 7 discrete hypermetabolic small bowel lesions were seen on previous PET-CT. 5. Left colonic diverticulosis without diverticulitis. 6. Aortic Atherosclerosis (ICD10-I70.0).   02/22/2022 Imaging   EXAM: CT ABDOMEN AND PELVIS WITH CONTRAST  IMPRESSION: 1. Mural soft tissue nodule of the small bowel in the midline ventral abdomen is unchanged, as is a small adjacent metastatic mesenteric nodule. 2. No evidence of new metastatic disease in the abdomen or pelvis. 3. Diverticulosis without evidence of acute diverticulitis. 4. Coronary artery disease.      INTERVAL HISTORY:  Tanya Walsh is here for a follow up of metastatic neuroendocrine tumor . She was last seen by me on 02/03/2023. She presents to the clinic alone. Pt state that she has some stomach pains and some diarrhea when she gets the injection. Pt state that she does feel some benefits from the injections.   All other systems were reviewed with the patient and are negative.  MEDICAL HISTORY:  Past Medical History:  Diagnosis Date   Coronary artery disease    Depression    GI bleed 07/17/2015   Hypertension    Myocardial infarction [facility])    Nocturia 08/14/2019   Refusal of blood transfusions as patient is Jehovah's Witness     SURGICAL HISTORY: Past Surgical History:  Procedure Laterality Date   ABDOMINAL HYSTERECTOMY     BACK  SURGERY     BIOPSY  12/10/2020   Procedure: BIOPSY;  Surgeon: Kerin Salen, MD;  Location[facility] ENDOSCOPY;  Service: Gastroenterology;;   BIOPSY  12/11/2020    Procedure: BIOPSY;  Surgeon: Kerin Salen, MD;  Location: [facility] ENDOSCOPY;  Service: Gastroenterology;;   COLONOSCOPY WITH PROPOFOL N/A 12/11/2020   Procedure: COLONOSCOPY WITH PROPOFOL;  Surgeon: Kerin Salen, MD;  Location: [facility] ENDOSCOPY;  Service: Gastroenterology;  Laterality: N/A;   ESOPHAGOGASTRODUODENOSCOPY N/A 07/17/2015   Procedure: ESOPHAGOGASTRODUODENOSCOPY (EGD);  Surgeon: Graylin Shiver, MD;  Location: [facility] ENDOSCOPY;  Service: Endoscopy;  Laterality: N/A;   ESOPHAGOGASTRODUODENOSCOPY (EGD) WITH PROPOFOL N/A 12/10/2020   Procedure: ESOPHAGOGASTRODUODENOSCOPY (EGD) WITH PROPOFOL;  Surgeon: Kerin Salen, MD;  Location: [place] [facility] ENDOSCOPY;  Service: Gastroenterology;  Laterality: N/A;   HEMOSTASIS CLIP PLACEMENT  12/10/2020   Procedure: HEMOSTASIS CLIP PLACEMENT;  Surgeon: Kerin Salen, MD;  Location: [facility] ENDOSCOPY;  Service: Gastroenterology;;    I have reviewed the social history and family history with the patient and they are unchanged from previous note.  ALLERGIES:  has No Known Allergies.  MEDICATIONS:  Current Outpatient Medications  Medication Sig Dispense Refill   cyclobenzaprine (FLEXERIL) 5 MG tablet Take 1 tablet (5 mg total) by mouth 3 (three) times daily as needed for muscle spasms. 30 tablet 0   DULoxetine (CYMBALTA) 60 MG capsule Take 60 mg by mouth daily.     fluticasone (FLONASE) 50 MCG/ACT nasal spray Place 1 spray into both nostrils daily.     ketorolac (TORADOL) 60 MG/2ML SOLN injection Inject 60 mg into the muscle once.     losartan-hydrochlorothiazide (HYZAAR) 50-12.5 MG tablet Take 1 tablet by mouth daily.     metFORMIN (GLUCOPHAGE-XR) 500 MG 24 hr tablet Take 500 mg by mouth daily with breakfast.     Multiple Vitamins-Minerals (MULTIVITAMIN ADULTS 50+) TABS Take 1 tablet by mouth daily. (Patient not taking: Reported on 09/29/2022)     nitroGLYCERIN (NITROSTAT) 0.4 MG SL tablet Place 1 tablet (0.4 mg total) under the tongue every 5 (five) minutes as needed for chest pain. 30  tablet 0   pantoprazole (PROTONIX) 40 MG tablet Take 40 mg by mouth daily.     rosuvastatin (CRESTOR) 20 MG tablet Take 20 mg by mouth daily.     traMADol (ULTRAM) 50 MG tablet Take 1 tablet (50 mg total) by mouth every 12 (twelve) hours as needed. 20 tablet 0   trolamine salicylate (ASPERCREME) 10 % cream Apply 1 application topically as needed for muscle pain.     No current facility-administered medications for this visit.    PHYSICAL EXAMINATION: ECOG PERFORMANCE STATUS: 0 - Asymptomatic  Vitals:   03/03/23 1318  BP: (!) 148/89  Pulse: 69  Resp: 18  Temp: 98.6 F (37 C)  SpO2: 99%   Wt Readings from Last 3 Encounters:  03/03/23 185 lb (83.9 kg)  02/03/23 187 lb 4.8 oz (85 kg)  11/25/22 192 lb 8 oz (87.3 kg)     GENERAL:alert, no distress and comfortable SKIN: skin color normal, no rashes or significant lesions EYES: normal, Conjunctiva are pink and non-injected, sclera clear  NEURO: alert & oriented x 3 with fluent speech LABORATORY DATA:  I have reviewed the data as listed    Latest Ref Rng & Units 03/03/2023   12:41 PM 11/25/2022   10:13 AM 08/29/2022    8:49 PM  CBC  WBC 4.0 - 10.5 K/uL 6.2  5.0    Hemoglobin 12.0 - 15.0 g/dL 16.1  09.6  04.5  Hematocrit 36.0 - 46.0 % 42.5  41.0  43.0   Platelets 150 - 400 K/uL 305  302          Latest Ref Rng & Units 03/03/2023   12:41 PM 11/25/2022   10:13 AM 08/30/2022    1:41 AM  CMP  Glucose 70 - 99 mg/dL 161  096  045   BUN 8 - 23 mg/dL 7  11  9    Creatinine 0.44 - 1.00 mg/dL 4.09  8.11  9.14   Sodium 135 - 145 mmol/L 141  140  137   Potassium 3.5 - 5.1 mmol/L 3.9  3.8  3.7   Chloride 98 - 111 mmol/L 103  102  105   CO2 22 - 32 mmol/L 34  34  25   Calcium 8.9 - 10.3 mg/dL 9.7  9.2  8.3   Total Protein 6.5 - 8.1 g/dL 6.4  6.9  6.1   Total Bilirubin 0.3 - 1.2 mg/dL 0.5  0.6  0.6   Alkaline Phos 38 - 126 U/L 43  55  59   AST 15 - 41 U/L 19  19  21    ALT 0 - 44 U/L 17  23  14        RADIOGRAPHIC STUDIES: I  have personally reviewed the radiological images as listed and agreed with the findings in the report. No results found.    No orders of the defined types were placed in this encounter.  All questions were answered. The patient knows to call the clinic with any problems, questions or concerns. No barriers to learning was detected. The total time spent in the appointment was 25 minutes.     Malachy Mood, MD 03/03/2023   Carolin Coy, CMA, am acting as scribe for Malachy Mood, MD.   I have reviewed the above documentation for accuracy and completeness, and I agree with the above.

## 2023-03-04 LAB — CHROMOGRANIN A: Chromogranin A (ng/mL): 43.2 ng/mL (ref 0.0–101.8)

## 2023-03-22 ENCOUNTER — Encounter (HOSPITAL_COMMUNITY): Payer: Self-pay

## 2023-03-22 ENCOUNTER — Emergency Department (HOSPITAL_COMMUNITY)
Admission: EM | Admit: 2023-03-22 | Discharge: 2023-03-22 | Disposition: A | Discharge status: Home / Self Care | Payer: Medicare Other | Attending: Emergency Medicine | Admitting: Emergency Medicine

## 2023-03-22 DIAGNOSIS — Z79899 Other long term (current) drug therapy: Secondary | ICD-10-CM | POA: Diagnosis not present

## 2023-03-22 DIAGNOSIS — Z7984 Long term (current) use of oral hypoglycemic drugs: Secondary | ICD-10-CM | POA: Diagnosis not present

## 2023-03-22 DIAGNOSIS — R220 Localized swelling, mass and lump, head: Secondary | ICD-10-CM | POA: Diagnosis present

## 2023-03-22 DIAGNOSIS — T7840XA Allergy, unspecified, initial encounter: Secondary | ICD-10-CM | POA: Insufficient documentation

## 2023-03-22 DIAGNOSIS — L0201 Cutaneous abscess of face: Secondary | ICD-10-CM | POA: Diagnosis not present

## 2023-03-22 MED ORDER — DOXYCYCLINE HYCLATE 100 MG PO CAPS: 100.0000 mg | ORAL_CAPSULE | Freq: Two times a day (BID) | ORAL | 0 refills | Status: DC

## 2023-03-22 MED ORDER — DEXAMETHASONE SODIUM PHOSPHATE 10 MG/ML IJ SOLN
10.0000 mg | Freq: Once | INTRAMUSCULAR | Status: AC
  Administered 2023-03-22: 10 mg via INTRAMUSCULAR
  Filled 2023-03-22: qty 1

## 2023-03-22 NOTE — ED Provider Notes (Signed)
[place] EMERGENCY DEPARTMENT AT [facility] Provider Note   CSN: [id number] Arrival date & time: 03/22/23  1034     History  Chief Complaint  Patient presents with   Facial Swelling    Tanya Walsh is a 67 y.o. female.  Patient is a 67 year old female who presents with facial swelling.  She recently has had a small abscess to her right upper face/forehead.  She went to urgent care and was started on Bactrim.  She took the first dose last night.  She woke up this morning with swelling to the left side of her lip and face with some itching to her lips.  She has had 1 prior angioedema type reaction several years ago in [place].  She has no known antibiotic allergies.  She denies any other rash or itching.  She says the swelling has improved throughout the morning.  She denies any trouble swallowing.  No shortness of breath.  No swelling of her tongue or throat.  She overall feels like the small abscess to her right forehead has improved.       Home Medications Prior to Admission medications   Medication Sig Start Date End Date Taking? Authorizing Provider  doxycycline (VIBRAMYCIN) 100 MG capsule Take 1 capsule (100 mg total) by mouth 2 (two) times daily. One po bid x 7 days 03/22/23  Yes Rolan Bucco, MD  cyclobenzaprine (FLEXERIL) 5 MG tablet Take 1 tablet (5 mg total) by mouth 3 (three) times daily as needed for muscle spasms. 12/11/20   Erick Blinks, MD  DULoxetine (CYMBALTA) 60 MG capsule Take 60 mg by mouth daily.    [provider]  fluticasone (FLONASE) 50 MCG/ACT nasal spray Place 1 spray into both nostrils daily. 11/11/21   [provider]  ketorolac (TORADOL) 60 MG/2ML SOLN injection Inject 60 mg into the muscle once. 08/18/22   [provider]  losartan-hydrochlorothiazide (HYZAAR) 50-12.5 MG tablet Take 1 tablet by mouth daily. 03/21/18   [provider]  metFORMIN (GLUCOPHAGE-XR) 500 MG 24 hr tablet Take 500 mg by mouth  daily with breakfast. 09/07/22   [provider]  Multiple Vitamins-Minerals (MULTIVITAMIN ADULTS 50+) TABS Take 1 tablet by mouth daily. Patient not taking: Reported on 09/29/2022    [provider]  nitroGLYCERIN (NITROSTAT) 0.4 MG SL tablet Place 1 tablet (0.4 mg total) under the tongue every 5 (five) minutes as needed for chest pain. 03/29/18   Dione Booze, MD  pantoprazole (PROTONIX) 40 MG tablet Take 40 mg by mouth daily.    [provider]  rosuvastatin (CRESTOR) 20 MG tablet Take 20 mg by mouth daily.    [provider]  traMADol (ULTRAM) 50 MG tablet Take 1 tablet (50 mg total) by mouth every 12 (twelve) hours as needed. 03/03/23   Malachy Mood, MD  trolamine salicylate (ASPERCREME) 10 % cream Apply 1 application topically as needed for muscle pain.    [provider]      Allergies    Patient has no known allergies.    Review of Systems   Review of Systems  Constitutional:  Negative for fatigue and fever.  HENT:  Positive for facial swelling. Negative for sore throat, trouble swallowing and voice change.   Respiratory:  Negative for shortness of breath.   Cardiovascular:  Negative for chest pain.  Gastrointestinal:  Negative for nausea and vomiting.  Skin:  Positive for wound.  Neurological:  Negative for headaches.    Physical Exam Updated Vital  Signs BP (!) 162/93 (BP Location: Left Arm)   Pulse 80   Temp 98.2 F (36.8 C) (Oral)   Resp 16   SpO2 99%  Physical Exam Constitutional:      Appearance: She is well-developed.  HENT:     Head: Normocephalic and atraumatic.     Comments: Small abscess to her right forehead.  There is no induration or fluctuance.  There is is gab at the top of it.  No surrounding signs of cellulitis.  There are some swelling to the left side of the cheek and lips.  Oropharynx is clear without other evidence of angioedema. Eyes:     Pupils: Pupils are equal, round, and reactive to light.   Cardiovascular:     Rate and Rhythm: Normal rate and regular rhythm.     Heart sounds: Normal heart sounds.  Pulmonary:     Effort: Pulmonary effort is normal. No respiratory distress.     Breath sounds: Normal breath sounds. No stridor. No wheezing or rales.  Chest:     Chest wall: No tenderness.  Abdominal:     General: Bowel sounds are normal.     Palpations: Abdomen is soft.     Tenderness: There is no abdominal tenderness. There is no guarding or rebound.  Musculoskeletal:        General: Normal range of motion.     Cervical back: Normal range of motion and neck supple.  Lymphadenopathy:     Cervical: No cervical adenopathy.  Skin:    General: Skin is warm and dry.     Findings: No rash.  Neurological:     Mental Status: She is alert and oriented to person, place, and time.        ED Results / Procedures / Treatments   Labs (all labs ordered are listed, but only abnormal results are displayed) Labs Reviewed - No data to display  EKG None  Radiology No results found.  Procedures Procedures    Medications Ordered in ED Medications  dexamethasone (DECADRON) injection 10 mg (has no administration in time range)    ED Course/ Medical Decision Making/ A&P                             Medical Decision Making Risk Prescription drug management.   Patient is a 67 year old who presents with left-sided facial swelling.  She has a small abscess to her right forehead which seems to be improving.  I suspect the left-sided facial swelling is more related to an antibiotic/allergic reaction rather than worsening infection.  Seems to be isolated from the small abscess.  She does not look systemically ill.  No fevers.  No airway compromise.  Overall seems to be improving.  Will give her dose of Decadron.  She was advised that she can use Benadryl as needed.  Will switch her antibiotic to doxycycline.  She was discharged home in good condition.  Return precautions  given.  Final Clinical Impression(s) / ED Diagnoses Final diagnoses:  Allergic reaction, initial encounter  Facial abscess    Rx / DC Orders ED Discharge Orders          Ordered    doxycycline (VIBRAMYCIN) 100 MG capsule  2 times daily        03/22/23 1209              Rolan Bucco, MD 03/22/23 1213

## 2023-03-22 NOTE — ED Triage Notes (Signed)
Pt arrived via POV, c/o facial swelling, left sided. States she started bactrim yesterday. Itching.

## 2023-04-06 ENCOUNTER — Telehealth: Payer: Self-pay | Admitting: Hematology

## 2023-04-07 ENCOUNTER — Inpatient Hospital Stay: Payer: Medicare Other

## 2023-04-09 ENCOUNTER — Ambulatory Visit (HOSPITAL_COMMUNITY)
Admission: EM | Admit: 2023-04-09 | Discharge: 2023-04-09 | Disposition: A | Discharge status: Home / Self Care | Payer: Medicare Other | Attending: Physician Assistant | Admitting: Physician Assistant

## 2023-04-09 ENCOUNTER — Encounter (HOSPITAL_COMMUNITY): Payer: Self-pay

## 2023-04-09 DIAGNOSIS — J069 Acute upper respiratory infection, unspecified: Secondary | ICD-10-CM | POA: Diagnosis present

## 2023-04-09 DIAGNOSIS — U071 COVID-19: Secondary | ICD-10-CM | POA: Insufficient documentation

## 2023-04-09 LAB — POCT INFLUENZA A/B
Influenza A, POC: NEGATIVE
Influenza B, POC: NEGATIVE

## 2023-04-09 NOTE — ED Triage Notes (Signed)
Patient here today with c/o cough, body aches, headaches, weakness, runny nose, congestion, and ST X 2 days. Symptoms seem to be worsening. She has been using a cough syrup but not helping. She went to a convention last week where there were a lot of people.

## 2023-04-09 NOTE — Discharge Instructions (Addendum)
Good to meet you today.  Your flu test was negative.  Your COVID-19 test is still pending.  If this is positive, you will be contacted.  Please try to rest and hydrate at home.  You can take Tylenol or ibuprofen as you need for your symptoms.  If any acute worsening symptoms such as severe headache or dizziness, shortness of breath, chest pain, etc., please present to the nearest emergency department.

## 2023-04-09 NOTE — ED Provider Notes (Signed)
[facility]   MRN: [id number] DOB: 05/09/1956  Subjective:   Tanya Walsh is a 67 y.o. female presenting for 2 days of cough, body aches, headaches, generalized weakness and congestion.  She feels tired today.  Denies any shortness of breath or chest pain.  She was recently at a convention and not sure if she picked up sickness there.  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  No current facility-administered medications for this encounter.  Current Outpatient Medications:    losartan-hydrochlorothiazide (HYZAAR) 50-12.5 MG tablet, Take 1 tablet by mouth daily., Disp: , Rfl:    cyclobenzaprine (FLEXERIL) 5 MG tablet, Take 1 tablet (5 mg total) by mouth 3 (three) times daily as needed for muscle spasms., Disp: 30 tablet, Rfl: 0   doxycycline (VIBRAMYCIN) 100 MG capsule, Take 1 capsule (100 mg total) by mouth 2 (two) times daily. One po bid x 7 days, Disp: 14 capsule, Rfl: 0   DULoxetine (CYMBALTA) 60 MG capsule, Take 60 mg by mouth daily., Disp: , Rfl:    fluticasone (FLONASE) 50 MCG/ACT nasal spray, Place 1 spray into both nostrils daily., Disp: , Rfl:    ketorolac (TORADOL) 60 MG/2ML SOLN injection, Inject 60 mg into the muscle once., Disp: , Rfl:    metFORMIN (GLUCOPHAGE-XR) 500 MG 24 hr tablet, Take 500 mg by mouth daily with breakfast., Disp: , Rfl:    Multiple Vitamins-Minerals (MULTIVITAMIN ADULTS 50+) TABS, Take 1 tablet by mouth daily. (Patient not taking: Reported on 09/29/2022), Disp: , Rfl:    nitroGLYCERIN (NITROSTAT) 0.4 MG SL tablet, Place 1 tablet (0.4 mg total) under the tongue every 5 (five) minutes as needed for chest pain., Disp: 30 tablet, Rfl: 0   pantoprazole (PROTONIX) 40 MG tablet, Take 40 mg by mouth daily., Disp: , Rfl:    rosuvastatin (CRESTOR) 20 MG tablet, Take 20 mg by mouth daily., Disp: , Rfl:    traMADol (ULTRAM) 50 MG tablet, Take 1 tablet (50 mg total) by mouth every 12 (twelve) hours as needed., Disp: 20 tablet,  Rfl: 0   trolamine salicylate (ASPERCREME) 10 % cream, Apply 1 application topically as needed for muscle pain., Disp: , Rfl:    Allergies  Allergen Reactions   Bactrim [Sulfamethoxazole-Trimethoprim] Swelling    Past Medical History:  Diagnosis Date   Coronary artery disease    Depression    GI bleed 07/17/2015   Hypertension    Myocardial infarction [facility])    Nocturia 08/14/2019   Refusal of blood transfusions as patient is Jehovah's Witness      Past Surgical History:  Procedure Laterality Date   ABDOMINAL HYSTERECTOMY     BACK SURGERY     BIOPSY  12/10/2020   Procedure: BIOPSY;  Surgeon: Kerin Salen, MD;  Location: [facility] ENDOSCOPY;  Service: Gastroenterology;;   BIOPSY  12/11/2020   Procedure: BIOPSY;  Surgeon: Kerin Salen, MD;  Location: [facility] ENDOSCOPY;  Service: Gastroenterology;;   COLONOSCOPY WITH PROPOFOL N/A 12/11/2020   Procedure: COLONOSCOPY WITH PROPOFOL;  Surgeon: Kerin Salen, MD;  Location: [facility] ENDOSCOPY;  Service: Gastroenterology;  Laterality: N/A;   ESOPHAGOGASTRODUODENOSCOPY N/A 07/17/2015   Procedure: ESOPHAGOGASTRODUODENOSCOPY (EGD);  Surgeon: Graylin Shiver, MD;  Location: [facility] ENDOSCOPY;  Service: Endoscopy;  Laterality: N/A;   ESOPHAGOGASTRODUODENOSCOPY (EGD) WITH PROPOFOL N/A 12/10/2020   Procedure: ESOPHAGOGASTRODUODENOSCOPY (EGD) WITH PROPOFOL;  Surgeon: Kerin Salen, MD;  Location: [facility] ENDOSCOPY;  Service: Gastroenterology;  Laterality: N/A;   HEMOSTASIS CLIP PLACEMENT  12/10/2020   Procedure: HEMOSTASIS  CLIP PLACEMENT;  Surgeon: Kerin Salen, MD;  Location: [facility] ENDOSCOPY;  Service: Gastroenterology;;    Family History  Problem Relation Age of Onset   Stroke Mother    Cancer Mother        brain   Heart disease Father    Heart disease Brother    Breast cancer Niece     Social History   Tobacco Use   Smoking status: Never   Smokeless tobacco: Never  Vaping Use   Vaping status: Never Used  Substance Use Topics   Alcohol use: Yes    Comment: wine rarely    Drug use: No    ROS REFER TO HPI FOR PERTINENT POSITIVES AND NEGATIVES   Objective:   Vitals: BP (!) 152/91 (BP Location: Left Arm)   Pulse 82   Temp 99.6 F (37.6 C) (Oral)   Resp 16   Ht 5\' 3"  (1.6 m)   Wt 187 lb (84.8 kg)   SpO2 95%   BMI 33.13 kg/m   Physical Exam Vitals and nursing note reviewed.  Constitutional:      General: She is not in acute distress.    Appearance: Normal appearance. She is ill-appearing.  HENT:     Head: Normocephalic.     Right Ear: Tympanic membrane, ear canal and external ear normal.     Left Ear: Tympanic membrane, ear canal and external ear normal.     Nose: Congestion present.     Mouth/Throat:     Mouth: Mucous membranes are moist.     Pharynx: No oropharyngeal exudate or posterior oropharyngeal erythema.  Eyes:     Extraocular Movements: Extraocular movements intact.     Conjunctiva/sclera: Conjunctivae normal.     Pupils: Pupils are equal, round, and reactive to light.  Cardiovascular:     Rate and Rhythm: Normal rate and regular rhythm.     Pulses: Normal pulses.     Heart sounds: Normal heart sounds. No murmur heard. Pulmonary:     Effort: Pulmonary effort is normal. No respiratory distress.     Breath sounds: Normal breath sounds. No wheezing or rhonchi.  Musculoskeletal:     Cervical back: Normal range of motion.  Skin:    General: Skin is warm.  Neurological:     Mental Status: She is alert and oriented to person, place, and time.  Psychiatric:        Mood and Affect: Mood normal.        Behavior: Behavior normal.     Results for orders placed or performed during the hospital encounter of 04/09/23 (from the past 24 hour(s))  POC Influenza A/B     Status: None   Collection Time: 04/09/23  5:27 PM  Result Value Ref Range   Influenza A, POC Negative Negative   Influenza B, POC Negative Negative    Assessment and Plan :   PDMP not reviewed this encounter.  1. Acute upper respiratory infection    Reassured  the patient that her point-of-care influenza test was negative.  She may have COVID-19, especially being at a convention and local rates are on the rise. Discussed that if her COVID test is positive, we will need to speak with her oncologist about antiviral therapy options. She will go home and rest, hydrate, take Tylenol or ibuprofen as needed.  Return to care precautions advised.  ER precautions advised.  Patient agreeable and understanding of plan.     AllwardtCrist Infante, PA-C 04/09/23 1748

## 2023-04-10 LAB — SARS CORONAVIRUS 2 (TAT 6-24 HRS): SARS Coronavirus 2: POSITIVE — AB

## 2023-04-11 ENCOUNTER — Telehealth: Payer: Self-pay

## 2023-04-11 NOTE — Telephone Encounter (Signed)
Pt called stating she tested positive for Covid on 04/09/2023.  Pt stated she was seen on 04/09/2023 at the Urgent Care where they tested pt for Covid.  Pt wanted to know if it was OK to get medication for Covid.  Stated "Yes".  Pt will contact urgent care or PCP to obtain prescription for Covid.

## 2023-04-12 ENCOUNTER — Other Ambulatory Visit: Payer: Self-pay | Admitting: Physician Assistant

## 2023-04-12 MED ORDER — NIRMATRELVIR/RITONAVIR (PAXLOVID)TABLET: 3.0000 | ORAL_TABLET | Freq: Two times a day (BID) | ORAL | 0 refills | Status: AC

## 2023-04-18 ENCOUNTER — Other Ambulatory Visit: Payer: Self-pay

## 2023-04-18 ENCOUNTER — Emergency Department (HOSPITAL_COMMUNITY)
Admission: EM | Admit: 2023-04-18 | Discharge: 2023-04-18 | Disposition: A | Discharge status: Home / Self Care | Payer: Medicare Other | Attending: Emergency Medicine | Admitting: Emergency Medicine

## 2023-04-18 ENCOUNTER — Emergency Department (HOSPITAL_COMMUNITY): Payer: Medicare Other

## 2023-04-18 ENCOUNTER — Encounter (HOSPITAL_COMMUNITY): Payer: Self-pay

## 2023-04-18 DIAGNOSIS — Z79899 Other long term (current) drug therapy: Secondary | ICD-10-CM | POA: Diagnosis not present

## 2023-04-18 DIAGNOSIS — R739 Hyperglycemia, unspecified: Secondary | ICD-10-CM | POA: Diagnosis not present

## 2023-04-18 DIAGNOSIS — R42 Dizziness and giddiness: Secondary | ICD-10-CM

## 2023-04-18 DIAGNOSIS — R5383 Other fatigue: Secondary | ICD-10-CM

## 2023-04-18 DIAGNOSIS — I2510 Atherosclerotic heart disease of native coronary artery without angina pectoris: Secondary | ICD-10-CM | POA: Diagnosis not present

## 2023-04-18 DIAGNOSIS — E860 Dehydration: Secondary | ICD-10-CM

## 2023-04-18 DIAGNOSIS — I10 Essential (primary) hypertension: Secondary | ICD-10-CM | POA: Insufficient documentation

## 2023-04-18 DIAGNOSIS — U071 COVID-19: Secondary | ICD-10-CM | POA: Diagnosis not present

## 2023-04-18 DIAGNOSIS — Z7984 Long term (current) use of oral hypoglycemic drugs: Secondary | ICD-10-CM | POA: Diagnosis not present

## 2023-04-18 DIAGNOSIS — R0602 Shortness of breath: Secondary | ICD-10-CM | POA: Insufficient documentation

## 2023-04-18 LAB — BASIC METABOLIC PANEL
Anion gap: 11 (ref 5–15)
BUN: 11 mg/dL (ref 8–23)
CO2: 27 mmol/L (ref 22–32)
Calcium: 9.3 mg/dL (ref 8.9–10.3)
Chloride: 98 mmol/L (ref 98–111)
Creatinine, Ser: 0.93 mg/dL (ref 0.44–1.00)
GFR, Estimated: >60 mL/min (ref >60)
Glucose, Bld: 168 mg/dL — ABNORMAL HIGH (ref 70–99)
Potassium: 3.7 mmol/L (ref 3.5–5.1)
Sodium: 136 mmol/L (ref 135–145)

## 2023-04-18 LAB — CBC
HCT: 47.2 % — ABNORMAL HIGH (ref 36.0–46.0)
Hemoglobin: 15.1 g/dL — ABNORMAL HIGH (ref 12.0–15.0)
MCH: 29.3 pg (ref 26.0–34.0)
MCHC: 32 g/dL (ref 30.0–36.0)
MCV: 91.7 fL (ref 80.0–100.0)
Platelets: 409 10*3/uL — ABNORMAL HIGH (ref 150–400)
RBC: 5.15 MIL/uL — ABNORMAL HIGH (ref 3.87–5.11)
RDW: 12.4 % (ref 11.5–15.5)
WBC: 6.2 10*3/uL (ref 4.0–10.5)
nRBC: 0 % (ref 0.0–0.2)

## 2023-04-18 LAB — URINALYSIS, ROUTINE W REFLEX MICROSCOPIC
Bilirubin Urine: NEGATIVE
Glucose, UA: NEGATIVE mg/dL
Hgb urine dipstick: NEGATIVE
Ketones, ur: NEGATIVE mg/dL
Nitrite: NEGATIVE
Protein, ur: NEGATIVE mg/dL
Specific Gravity, Urine: 1.011 (ref 1.005–1.030)
pH: 7 (ref 5.0–8.0)

## 2023-04-18 LAB — BRAIN NATRIURETIC PEPTIDE: B Natriuretic Peptide: 24.7 pg/mL (ref 0.0–100.0)

## 2023-04-18 LAB — HEPATIC FUNCTION PANEL
ALT: 24 U/L (ref 0–44)
AST: 20 U/L (ref 15–41)
Albumin: 4.2 g/dL (ref 3.5–5.0)
Alkaline Phosphatase: 43 U/L (ref 38–126)
Bilirubin, Direct: <0.1 mg/dL (ref 0.0–0.2)
Total Bilirubin: 0.8 mg/dL (ref 0.3–1.2)
Total Protein: 7.8 g/dL (ref 6.5–8.1)

## 2023-04-18 LAB — CBG MONITORING, ED: Glucose-Capillary: 169 mg/dL — ABNORMAL HIGH (ref 70–99)

## 2023-04-18 LAB — MAGNESIUM: Magnesium: 2.2 mg/dL (ref 1.7–2.4)

## 2023-04-18 LAB — TROPONIN I (HIGH SENSITIVITY): Troponin I (High Sensitivity): 5 ng/L (ref <18)

## 2023-04-18 LAB — LIPASE, BLOOD: Lipase: 24 U/L (ref 11–51)

## 2023-04-18 MED ORDER — IOHEXOL 350 MG/ML SOLN
100.0000 mL | Freq: Once | INTRAVENOUS | Status: AC | PRN
  Administered 2023-04-18: 75 mL via INTRAVENOUS

## 2023-04-18 MED ORDER — SODIUM CHLORIDE (PF) 0.9 % IJ SOLN
INTRAMUSCULAR | Status: AC
  Filled 2023-04-18: qty 50

## 2023-04-18 MED ORDER — LACTATED RINGERS IV BOLUS
1000.0000 mL | Freq: Once | INTRAVENOUS | Status: AC
  Administered 2023-04-18: 1000 mL via INTRAVENOUS

## 2023-04-18 NOTE — ED Notes (Signed)
Pt ambulated around the ED with oxygen saturation maintaining at 100%  Pt advised she did feel weaker while walking and said she continues to have a feeling of "passing out"

## 2023-04-18 NOTE — ED Triage Notes (Signed)
Pt coming in today after waking up and feeling weak, and as if she is going to pass out. Pt dx with Covid last week. Currently receiving treatment for cancer, last chemo treatment in June. Denies nausea/vomiting/diarrhea, or new pain.

## 2023-04-18 NOTE — Discharge Instructions (Addendum)
Your EKG, CT imaging and laboratory evaluation was overall reassuring.  Your symptoms are likely due to mild dehydration in the setting of your COVID-19 infection.

## 2023-04-18 NOTE — ED Provider Notes (Signed)
[place] EMERGENCY DEPARTMENT AT [facility] Provider Note   CSN: [id number] Arrival date & time: 04/18/23  4098     History  Chief Complaint  Patient presents with   Fatigue    Tanya Walsh is a 67 y.o. female.  HPI   68 year old female with medical history significant for CAD/MI, HTN, GI bleed with refusal of blood transfusions as the patient is a Jehovah's Witness who presents to the emergency department with fatigue.  The patient states that she has been feeling lightheaded, fatigued, dehydrated.  She was diagnosed with COVID 1 week ago.  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  She states that she has had a dry cough, persistent shortness of breath for the past week.  She denies any chest pain.  No pleuritic discomfort.  She has had decreased oral intake over the past week and feels lightheaded and as if she might pass out.  Home Medications Prior to Admission medications   Medication Sig Start Date End Date Taking? Authorizing Provider  cyclobenzaprine (FLEXERIL) 5 MG tablet Take 1 tablet (5 mg total) by mouth 3 (three) times daily as needed for muscle spasms. 12/11/20   Erick Blinks, MD  doxycycline (VIBRAMYCIN) 100 MG capsule Take 1 capsule (100 mg total) by mouth 2 (two) times daily. One po bid x 7 days 03/22/23   Rolan Bucco, MD  DULoxetine (CYMBALTA) 60 MG capsule Take 60 mg by mouth daily.    [provider]  fluticasone (FLONASE) 50 MCG/ACT nasal spray Place 1 spray into both nostrils daily. 11/11/21   [provider]  ketorolac (TORADOL) 60 MG/2ML SOLN injection Inject 60 mg into the muscle once. 08/18/22   [provider]  losartan-hydrochlorothiazide (HYZAAR) 50-12.5 MG tablet Take 1 tablet by mouth daily. 03/21/18   [provider]  metFORMIN (GLUCOPHAGE-XR) 500 MG 24 hr tablet Take 500 mg by mouth daily with  breakfast. 09/07/22   [provider]  Multiple Vitamins-Minerals (MULTIVITAMIN ADULTS 50+) TABS Take 1 tablet by mouth daily. Patient not taking: Reported on 09/29/2022    [provider]  nitroGLYCERIN (NITROSTAT) 0.4 MG SL tablet Place 1 tablet (0.4 mg total) under the tongue every 5 (five) minutes as needed for chest pain. 03/29/18   Dione Booze, MD  pantoprazole (PROTONIX) 40 MG tablet Take 40 mg by mouth daily.    [provider]  rosuvastatin (CRESTOR) 20 MG tablet Take 20 mg by mouth daily.    [provider]  traMADol (ULTRAM) 50 MG tablet Take 1 tablet (50 mg total) by mouth every 12 (twelve) hours as needed. 03/03/23   Malachy Mood, MD  trolamine salicylate (ASPERCREME) 10 % cream Apply 1 application topically as needed for muscle pain.    [provider]      Allergies    Bactrim [sulfamethoxazole-trimethoprim]    Review of Systems   Review of Systems  All other systems reviewed and are negative.   Physical Exam Updated Vital Signs BP (!) 164/88   Pulse 67   Temp 98.6 F (37 C) (Oral)   Resp 18   Ht 5\' 3"  (1.6 m)   Wt 84.8 kg   SpO2 100%   BMI 33.13 kg/m  Physical Exam Vitals and nursing note reviewed.  Constitutional:      General: She is not in acute distress.    Appearance: She is well-developed.  HENT:  Head: Normocephalic and atraumatic.  Eyes:     Conjunctiva/sclera: Conjunctivae normal.  Cardiovascular:     Rate and Rhythm: Normal rate and regular rhythm.  Pulmonary:     Effort: Pulmonary effort is normal. No respiratory distress.     Breath sounds: Normal breath sounds.  Abdominal:     Palpations: Abdomen is soft.     Tenderness: There is no abdominal tenderness.  Musculoskeletal:        General: No swelling.     Cervical back: Neck supple.  Skin:    General: Skin is warm and dry.     Capillary Refill: Capillary refill takes less than 2 seconds.  Neurological:     General: No focal deficit present.      Mental Status: She is alert and oriented to person, place, and time. Mental status is at baseline.  Psychiatric:        Mood and Affect: Mood normal.     ED Results / Procedures / Treatments   Labs (all labs ordered are listed, but only abnormal results are displayed) Labs Reviewed  BASIC METABOLIC PANEL - Abnormal; Notable for the following components:      Result Value   Glucose, Bld 168 (*)    All other components within normal limits  CBC - Abnormal; Notable for the following components:   RBC 5.15 (*)    Hemoglobin 15.1 (*)    HCT 47.2 (*)    Platelets 409 (*)    All other components within normal limits  URINALYSIS, ROUTINE W REFLEX MICROSCOPIC - Abnormal; Notable for the following components:   Leukocytes,Ua TRACE (*)    Bacteria, UA RARE (*)    All other components within normal limits  CBG MONITORING, ED - Abnormal; Notable for the following components:   Glucose-Capillary 169 (*)    All other components within normal limits  HEPATIC FUNCTION PANEL  BRAIN NATRIURETIC PEPTIDE  MAGNESIUM  LIPASE, BLOOD  TROPONIN I (HIGH SENSITIVITY)    EKG EKG Interpretation Date/Time:  Monday April 18 2023 08:25:18 EDT Ventricular Rate:  71 PR Interval:  155 QRS Duration:  82 QT Interval:  392 QTC Calculation: 426 R Axis:   2  Text Interpretation: Sinus rhythm No significant change since prior 12/23 Confirmed by Meridee Score [phone]) on 04/18/2023 8:35:57 AM  Radiology CT Angio Chest PE W and/or Wo Contrast  Result Date: 04/18/2023 CLINICAL DATA:  Pulmonary embolism (PE) suspected, high prob today after waking up and feeling weak, and as if she is going to pass out. Pt dx with Covid last week. Currently receiving treatment for cancer, last chemo treatment in June. Denies nausea/vomiting/diarrhea, or new pain" EXAM: CT ANGIOGRAPHY CHEST WITH CONTRAST TECHNIQUE: Multidetector CT imaging of the chest was performed using the standard protocol during bolus administration of  intravenous contrast. Multiplanar CT image reconstructions and MIPs were obtained to evaluate the vascular anatomy. RADIATION DOSE REDUCTION: This exam was performed according to the departmental dose-optimization program which includes automated exposure control, adjustment of the mA and/or kV according to patient size and/or use of iterative reconstruction technique. CONTRAST:  75mL OMNIPAQUE IOHEXOL 350 MG/ML SOLN COMPARISON:  Chest x-ray 04/18/2023, CT abdomen pelvis 02/23/2023, PET CT 02/16/2021 FINDINGS: Cardiovascular: Satisfactory opacification of the pulmonary arteries to the segmental level. No evidence of pulmonary embolism. Normal heart size. No significant pericardial effusion. The thoracic aorta is normal in caliber. Mild atherosclerotic plaque of the thoracic aorta. Four-vessel coronary artery calcifications. Mediastinum/Nodes: No enlarged mediastinal, hilar, or axillary lymph nodes.  Thyroid gland, trachea, and esophagus demonstrate no significant findings. Lungs/Pleura: No focal consolidation. No pulmonary nodule. No pulmonary mass. No pleural effusion. No pneumothorax. Upper Abdomen: Redemonstration of poorly visualized peripheral right posterior hepatic lobe hypodensity measuring 2.3 x 1.7 cm. Partially visualized possible haziness of the proximal pancreatic parenchyma (5:139). Musculoskeletal: No chest wall abnormality. No suspicious lytic or blastic osseous lesions. No acute displaced fracture. Multilevel mild degenerative changes of the spine. Review of the MIP images confirms the above findings. IMPRESSION: 1. No pulmonary embolus. 2. No acute intrathoracic abnormality. 3. Aortic Atherosclerosis (ICD10-I70.0) including four-vessel coronary calcification. 4. Partially visualized possible haziness of the proximal pancreatic parenchyma. Finding may be artifactual. Consider correlation with lipase levels. 5. Redemonstration of poorly visualized peripheral right posterior hepatic lobe hypodensity  measuring 2.3 x 1.7 cm. Electronically Signed   By: Tish Frederickson M.D.   On: 04/18/2023 13:15   DG Chest 2 View  Result Date: 04/18/2023 CLINICAL DATA:  weakness EXAM: CHEST - 2 VIEW COMPARISON:  01/08/2021 FINDINGS: Lungs are clear. Heart size and mediastinal contours are within normal limits. Aortic Atherosclerosis (ICD10-170.0). No effusion. Mild thoracic dextroscoliosis. IMPRESSION: No acute cardiopulmonary disease. Electronically Signed   By: Corlis Leak M.D.   On: 04/18/2023 09:28    Procedures Procedures    Medications Ordered in ED Medications  lactated ringers bolus 1,000 mL (0 mLs Intravenous Stopped 04/18/23 1611)  iohexol (OMNIPAQUE) 350 MG/ML injection 100 mL (75 mLs Intravenous Contrast Given 04/18/23 1220)    ED Course/ Medical Decision Making/ A&P                                 Medical Decision Making Amount and/or Complexity of Data Reviewed Labs: ordered. Radiology: ordered.  Risk Prescription drug management.    67 year old female with medical history significant for CAD/MI, HTN, GI bleed with refusal of blood transfusions as the patient is a Jehovah's Witness who presents to the emergency department with fatigue.  The patient states that she has been feeling lightheaded, fatigued, dehydrated.  She was diagnosed with COVID 1 week ago.  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  She states that she has had a dry cough, persistent shortness of breath for the past week.  She denies any chest pain.  No pleuritic discomfort.  She has had decreased oral intake over the past week and feels lightheaded and as if she might pass out.  On arrival, the patient was vitally stable, afebrile, not tachycardic or tachypneic, saturating well on room air.  An ambulatory pulse oximetry was performed with the patient ambulating around the emergency department with no desaturations noted on  room air.  She is hemodynamically stable.  Given the patient's lightheadedness, fatigue, considered vasovagal syncope, electrolyte abnormality, orthostatic hypotension, dehydration, less likely cardiogenic arrhythmia, PE although considered.  Patient presenting with likely symptoms of COVID-19.  Laboratory evaluation significant for CBG 169, CBC without a leukocytosis, evidence of hemoconcentration with a hemoglobin of 15.1, urinalysis without evidence of UTI, BMP generally unremarkable with mild hyperglycemia to 168, no significant electrolyte abnormality, normal renal function, troponin normal, BNP normal, lipase normal, magnesium normal.  The patient was administered an LR bolus of 1 L.  She denies any chest pain at this time.  Initial chest x-ray revealed clear lungs.  CTA PE study to rule out PE as the cause of the  patient's lightheadedness and near syncope was performed: IMPRESSION:  1. No pulmonary embolus.  2. No acute intrathoracic abnormality.  3. Aortic Atherosclerosis (ICD10-I70.0) including four-vessel  coronary calcification.  4. Partially visualized possible haziness of the proximal pancreatic  parenchyma. Finding may be artifactual. Consider correlation with  lipase levels.  5. Redemonstration of poorly visualized peripheral right posterior  hepatic lobe hypodensity measuring 2.3 x 1.7 cm.    No evidence of PE or other acute intrathoracic abnormality noted on CT imaging.  Laboratory evaluation has been reassuring.  On repeat assessment, the patient was tolerating oral intake, feeling symptomatically improved.  Patient symptoms are likely due to mild dehydration in the setting of COVID-19.  She was tolerating oral intake, orthostatic vitals were reassuring and she is ambulatory in the emergency department with no hypoxia.  I recommended continued symptomatic management at home, return precautions provided.   Final Clinical Impression(s) / ED Diagnoses Final diagnoses:  COVID-19   Other fatigue  Lightheadedness  Dehydration    Rx / DC Orders ED Discharge Orders     None         Ernie Avena, MD 04/20/23 [phone]

## 2023-05-05 ENCOUNTER — Inpatient Hospital Stay: Payer: Medicare Other

## 2023-05-12 ENCOUNTER — Inpatient Hospital Stay: Payer: Medicare Other | Attending: Hematology

## 2023-05-12 VITALS — BP 124/78 | HR 84 | Temp 98.5°F | Resp 20

## 2023-05-12 DIAGNOSIS — C7B8 Other secondary neuroendocrine tumors: Secondary | ICD-10-CM | POA: Insufficient documentation

## 2023-05-12 DIAGNOSIS — C7A8 Other malignant neuroendocrine tumors: Secondary | ICD-10-CM | POA: Diagnosis present

## 2023-05-12 MED ORDER — OCTREOTIDE ACETATE 30 MG IM KIT
30.0000 mg | PACK | Freq: Once | INTRAMUSCULAR | Status: AC
  Administered 2023-05-12 (×2): 30 mg via INTRAMUSCULAR
  Filled 2023-05-12: qty 1

## 2023-06-02 ENCOUNTER — Other Ambulatory Visit: Payer: Medicare Other

## 2023-06-02 ENCOUNTER — Ambulatory Visit: Payer: Medicare Other | Admitting: Hematology

## 2023-06-02 ENCOUNTER — Ambulatory Visit: Payer: Medicare Other

## 2023-06-09 ENCOUNTER — Encounter: Payer: Self-pay | Admitting: Hematology

## 2023-06-09 ENCOUNTER — Inpatient Hospital Stay: Payer: Medicare Other | Attending: Hematology

## 2023-06-09 ENCOUNTER — Inpatient Hospital Stay: Payer: Medicare Other

## 2023-06-09 ENCOUNTER — Other Ambulatory Visit: Payer: Self-pay

## 2023-06-09 ENCOUNTER — Inpatient Hospital Stay: Payer: Medicare Other | Admitting: Hematology

## 2023-06-09 VITALS — BP 133/95 | HR 72 | Temp 98.1°F | Resp 17 | Ht 63.0 in | Wt 183.2 lb

## 2023-06-09 DIAGNOSIS — C7B8 Other secondary neuroendocrine tumors: Secondary | ICD-10-CM | POA: Insufficient documentation

## 2023-06-09 DIAGNOSIS — C7A019 Malignant carcinoid tumor of the small intestine, unspecified portion: Secondary | ICD-10-CM

## 2023-06-09 DIAGNOSIS — C7A8 Other malignant neuroendocrine tumors: Secondary | ICD-10-CM

## 2023-06-09 DIAGNOSIS — Z79899 Other long term (current) drug therapy: Secondary | ICD-10-CM | POA: Diagnosis not present

## 2023-06-09 LAB — CMP (CANCER CENTER ONLY)
ALT: 23 U/L (ref 0–44)
AST: 20 U/L (ref 15–41)
Albumin: 4.2 g/dL (ref 3.5–5.0)
Alkaline Phosphatase: 49 U/L (ref 38–126)
Anion gap: 5 (ref 5–15)
BUN: 11 mg/dL (ref 8–23)
CO2: 32 mmol/L (ref 22–32)
Calcium: 9.6 mg/dL (ref 8.9–10.3)
Chloride: 103 mmol/L (ref 98–111)
Creatinine: 0.84 mg/dL (ref 0.44–1.00)
GFR, Estimated: >60 mL/min (ref >60)
Glucose, Bld: 137 mg/dL — ABNORMAL HIGH (ref 70–99)
Potassium: 3.6 mmol/L (ref 3.5–5.1)
Sodium: 140 mmol/L (ref 135–145)
Total Bilirubin: 0.6 mg/dL (ref 0.3–1.2)
Total Protein: 7.2 g/dL (ref 6.5–8.1)

## 2023-06-09 LAB — CBC WITH DIFFERENTIAL (CANCER CENTER ONLY)
Abs Immature Granulocytes: 0.01 10*3/uL (ref 0.00–0.07)
Basophils Absolute: 0 10*3/uL (ref 0.0–0.1)
Basophils Relative: 1 %
Eosinophils Absolute: 0.2 10*3/uL (ref 0.0–0.5)
Eosinophils Relative: 3 %
HCT: 41.4 % (ref 36.0–46.0)
Hemoglobin: 13.3 g/dL (ref 12.0–15.0)
Immature Granulocytes: 0 %
Lymphocytes Relative: 38 %
Lymphs Abs: 2.1 10*3/uL (ref 0.7–4.0)
MCH: 29.1 pg (ref 26.0–34.0)
MCHC: 32.1 g/dL (ref 30.0–36.0)
MCV: 90.6 fL (ref 80.0–100.0)
Monocytes Absolute: 0.5 10*3/uL (ref 0.1–1.0)
Monocytes Relative: 9 %
Neutro Abs: 2.8 10*3/uL (ref 1.7–7.7)
Neutrophils Relative %: 49 %
Platelet Count: 303 10*3/uL (ref 150–400)
RBC: 4.57 MIL/uL (ref 3.87–5.11)
RDW: 13.2 % (ref 11.5–15.5)
WBC Count: 5.5 10*3/uL (ref 4.0–10.5)
nRBC: 0 % (ref 0.0–0.2)

## 2023-06-09 MED ORDER — OCTREOTIDE ACETATE 30 MG IM KIT
30.0000 mg | PACK | Freq: Once | INTRAMUSCULAR | Status: AC
  Administered 2023-06-09: 30 mg via INTRAMUSCULAR
  Filled 2023-06-09: qty 1

## 2023-06-09 NOTE — Patient Instructions (Signed)
Octreotide Injection Solution What is this medication? OCTREOTIDE (ok TREE oh tide) treats high levels of growth hormone (acromegaly). It works by reducing the amount of growth hormone your body makes. This reduces symptoms and the risk of health problems caused by too much growth hormone, such as diabetes and heart disease. It may also be used to treat diarrhea caused by neuroendocrine tumors. It works by slowing down the release of serotonin from the tumor cells. This reduces the number of bowel movements you have. This medicine may be used for other purposes; ask your health care provider or pharmacist if you have questions. COMMON BRAND NAME(S): Berline Lopes, Sandostatin What should I tell my care team before I take this medication? They need to know if you have any of these conditions: Diabetes Gallbladder disease Kidney disease Liver disease Thyroid disease An unusual or allergic reaction to octreotide, other medications, foods, dyes, or preservatives Pregnant or trying to get pregnant Breast-feeding How should I use this medication? This medication is injected under the skin or into a vein. It is usually given by your care team in a hospital or clinic setting. If you get this medication at home, you will be taught how to prepare and give it. Use exactly as directed. Take it as directed on the prescription label at the same time every day. Keep taking it unless your care team tells you to stop. Allow the injection solution to come to room temperature before use. Do not warm it artificially. It is important that you put your used needles and syringes in a special sharps container. Do not put them in a trash can. If you do not have a sharps container, call your pharmacist or care team to get one. Talk to your care team about the use of this medication in children. Special care may be needed. Overdosage: If you think you have taken too much of this medicine contact a poison control center or  emergency room at once. NOTE: This medicine is only for you. Do not share this medicine with others. What if I miss a dose? If you miss a dose, take it as soon as you can. If it is almost time for your next dose, take only that dose. Do not take double or extra doses. What may interact with this medication? Bromocriptine Certain medications for blood pressure, heart disease, irregular heartbeat Cyclosporine Diuretics Medications for diabetes, including insulin Quinidine This list may not describe all possible interactions. Give your health care provider a list of all the medicines, herbs, non-prescription drugs, or dietary supplements you use. Also tell them if you smoke, drink alcohol, or use illegal drugs. Some items may interact with your medicine. What should I watch for while using this medication? Visit your care team for regular checks on your progress. Tell your care team if your symptoms do not start to get better or if they get worse. To help reduce irritation at the injection site, use a different site for each injection and make sure the solution is at room temperature before use. This medication may cause decreases in blood sugar. Signs of low blood sugar include chills, cool, pale skin or cold sweats, drowsiness, extreme hunger, fast heartbeat, headache, nausea, nervousness or anxiety, shakiness, trembling, unsteadiness, tiredness, or weakness. Contact your care team right away if you experience any of these symptoms. This medication may increase blood sugar. The risk may be higher in patients who already have diabetes. Ask your care team what you can do to lower your  risk of diabetes while taking this medication. You should make sure you get enough vitamin B12 while you are taking this medication. Discuss the foods you eat and the vitamins you take with your care team. What side effects may I notice from receiving this medication? Side effects that you should report to your care  team as soon as possible: Allergic reactions--skin rash, itching, hives, swelling of the face, lips, tongue, or throat Gallbladder problems--severe stomach pain, nausea, vomiting, fever Heart rhythm changes--fast or irregular heartbeat, dizziness, feeling faint or lightheaded, chest pain, trouble breathing High blood sugar (hyperglycemia)--increased thirst or amount of urine, unusual weakness or fatigue, blurry vision Low blood sugar (hypoglycemia)--tremors or shaking, anxiety, sweating, cold or clammy skin, confusion, dizziness, rapid heartbeat Low thyroid levels (hypothyroidism)--unusual weakness or fatigue, increased sensitivity to cold, constipation, hair loss, dry skin, weight gain, feelings of depression Low vitamin B12 level--pain, tingling, or numbness in the hands or feet, muscle weakness, dizziness, confusion, trouble concentrating Pancreatitis--severe stomach pain that spreads to your back or gets worse after eating or when touched, fever, nausea, vomiting Side effects that usually do not require medical attention (report to your care team if they continue or are bothersome): Diarrhea Dizziness Gas Headache Pain, redness, or irritation at injection site Stomach pain This list may not describe all possible side effects. Call your doctor for medical advice about side effects. You may report side effects to FDA at 1-800-FDA-1088. Where should I keep my medication? Keep out of the reach of children and pets. Store in the refrigerator. Protect from light. Allow to come to room temperature naturally. Do not use artificial heat. If protected from light, the injection may be stored between 20 and 30 degrees C (70 and 86 degrees F) for 14 days. After the initial use, throw away any unused portion of a multiple dose vial after 14 days. Get rid of any unused portions of the ampules after use. To get rid of medications that are no longer needed or have expired: Take the medication to a medication  take-back program. Ask your pharmacy or law enforcement to find a location. If you cannot return the medication, ask your pharmacist or care team how to get rid of the medication safely. NOTE: This sheet is a summary. It may not cover all possible information. If you have questions about this medicine, talk to your doctor, pharmacist, or health care provider.  2023 Elsevier/Gold Standard (2021-12-02 00:00:00)

## 2023-06-09 NOTE — Progress Notes (Signed)
[facility]   Telephone:(336) [phone] [phone]) [phone]   Clinic Follow up Note   Patient Care Team: Macy Mis, MD as PCP - General (Family Medicine) Quintella Reichert, MD as PCP - Sleep Medicine (Cardiology) Lyn Records, MD (Inactive) as PCP - Cardiology (Cardiology) Drema Dallas, DO as Consulting Physician (Neurology) Pollyann Samples, NP as Nurse Practitioner (Nurse Practitioner) Malachy Mood, MD as Consulting Physician (Oncology) Radonna Ricker, RN (Inactive) as Oncology Nurse Navigator Drema Dallas, DO as Consulting Physician (Neurology)  Date of Service:  06/09/2023  CHIEF COMPLAINT: f/u of neuroendocrine tumor  CURRENT THERAPY:  Sandostatin injection monthly  Oncology History   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  Liver biopsy was attempted, but the liver lesion was not visible on ultrasound. -She started Sandostatin injection in June 2022.  Overall tolerating well. Will continue.  -last restaging CT on 09/07/2022 showed stable disease  -She is clinically doing well overall, still has intermittent pain on the left side of abdomen, overall better than last year. -She has developed hyperglycemia, possibly related to Sandostatin injection, she is on metformin now.  Discussed diabetic diet, and I encouraged her to exercise. -Restaging CT scan from February 23, 2023 showed stable disease -Continue Sandostatin injection    Assessment and Plan    Metastatic Neuroendocrine Tumor -She is clinically stable with ongoing treatment. Patient reports loose bowel movements and abdominal discomfort, possibly related to treatment. -Continue current treatment regimen. -Consider trial of Imodium to manage loose bowel movements. -Order CT scan in December for disease monitoring.  Recent COVID-19 Infection Recovered with no  ongoing symptoms. -No further action required.  Diabetes Patient on Metformin, no recent issues reported. -Continue Metformin.  Dehydration Patient has history of dehydration episodes, possibly contributing to cramping and discomfort. -Increase intake of electrolyte-rich fluids like Gatorade. -Continue drinking water daily.  PLAN -Follow-up in 3 months with labs and scan to be done a week prior to the appointment.  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 Kerin Salen Impression - Normal upper third of esophagus, middle third of esophagus and lower third of esophagus. - Z-line regular, 38 cm from the incisors. - Erythematous mucosa in the antrum. Biopsied. Clip (MR conditional) was placed. - Normal examined duodenum. Biopsied.   12/10/2020 Pathology Results   FINAL MICROSCOPIC DIAGNOSIS:   A. DUODENUM, BIOPSY:  - Duodenal mucosa with no significant pathologic findings.  - Negative for increased intraepithelial lymphocytes and villous  architectural changes.   B. STOMACH, ANTRUM, BIOPSY:  - Reactive gastropathy.  - Warthin-Starry stain is negative for Helicobacter pylori.    12/11/2020 Procedure   Colonoscopy, Dr. Kerin Salen impression - One 5 mm polyp in the transverse colon, removed piecemeal using a cold biopsy forceps. Resected and retrieved. - Diverticulosis in the sigmoid colon, in the descending colon and in the transverse colon. - The examined portion of the ileum was normal. -  Non-bleeding internal hemorrhoids.   12/11/2020 Pathology Results   FINAL MICROSCOPIC DIAGNOSIS:   A. COLON, TRANSVERSE, POLYPECTOMY:  - Tubular adenoma.  - Negative for high grade dysplasia.    12/24/2020 Procedure   Capsule endoscopy report: A polypoid nodular growth was noted at 3 hours and 30 minutes and an ulcerated, polypoid growth was noted at 3 hours and 54 minutes which is likely the cause of obscure GI blood loss   01/23/2021 Imaging   CT entero AP w contrast IMPRESSION: 1. No acute findings identified within the abdomen or pelvis. 2. There are several focal areas of intraluminal hyperenhancement within the small bowel loops. The largest is in the nondilated mid to distal jejunum measuring 1.3 cm. Cannot rule out underlying small bowel neoplasm. Correlation with tissue sampling results advised. Correlation with results from capsule endoscopy. 3. There is a enhancing soft tissue nodule with central calcification within the central small bowel mesentery. This is nonspecific and may represent sequelae of inflammation/infection. Primary differential considerations include carcinoid tumor or metastatic adenopathy. If there is a clinical concern for carcinoid tumor consider further investigation with DOTATATE PET. 4. Left adrenal gland adenoma. 5. Distal colonic diverticulosis without signs of acute diverticulitis. 6. Aortic atherosclerosis.   02/16/2021 PET scan   IMPRESSION: 1. Evidence of well differentiated small bowel neuroendocrine tumor with mesenteric metastasis. 2. Approximately 7 discrete small bowel lesions with intense radiotracer activity. Lesion depicted on comparison CT enterography. 3. Two intensely radiotracer avid mesenteric implants centrally within the small bowel mesentery. 4. Single hepatic metastasis with a second potential hepatic metastasis versus peritoneal implant. 5. No small bowel obstruction. 6. No evidence of metastatic disease outside the  abdomen pelvis. 7. Patient may be a candidate for peptide receptor radiotherapy (Lu-177 DOTATATE) available at [facility] molecular imaging department [phone]).   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 No new or progressive interval findings. 2. Stable appearance of the 17 mm nodule central mesenteric nodule seen to be hypermetabolic on recent PET-CT. The second mesenteric lesion identified on that study is not discernible today. 3. Stable 1.5 cm subcapsular low-density lesion posterior right liver since PET-CT 02/16/2021. 4. 2 small bowel nodules are identified on imaging today although 7 discrete hypermetabolic small bowel lesions were seen on previous PET-CT. 5. Left colonic diverticulosis without diverticulitis. 6. Aortic Atherosclerosis (ICD10-I70.0).   02/22/2022 Imaging   EXAM: CT ABDOMEN AND PELVIS WITH CONTRAST  IMPRESSION: 1. Mural soft tissue nodule of the small bowel in the midline ventral abdomen is unchanged, as is a small adjacent metastatic mesenteric nodule. 2. No evidence of new metastatic disease in the abdomen or pelvis. 3. Diverticulosis without evidence of acute diverticulitis. 4. Coronary artery disease.      Discussed the use of AI scribe software for clinical note transcription with the patient, who gave verbal consent to proceed.  History of Present Illness   The patient, a 67 year old female with a history of metastatic and neuroendocrine tumor, presents for a follow-up visit. Over the past three months, the patient has experienced several health issues including  COVID-19, an ear infection, pink eye, and an unspecified infection that required incision  and drainage. The patient reports that the COVID-19 infection was "terrible" but feels she has recovered well. The patient also reports having an injection today for her condition.  The patient has been experiencing loose bowel movements, sometimes three times a day, which she attributes to the injections. She also reports occasional stomach pain and pain in the groin area and left leg, which sometimes goes numb. The patient has a history of back surgeries and is unsure if she has arthritis in the left hip.  The patient also reports occasional pain in different parts of the body, including the head, and cramping in the fingers. She has been advised to stay hydrated to manage these symptoms. The patient is also on metformin for diabetes and has had episodes of feeling like she is going to pass out, which has resulted in emergency room visits.  The patient is also considering cataract surgery and dental work, and has been given the go-ahead for these procedures by the doctor.         All other systems were reviewed with the patient and are negative.  MEDICAL HISTORY:  Past Medical History:  Diagnosis Date   Coronary artery disease    Depression    GI bleed 07/17/2015   Hypertension    Myocardial infarction [facility])    Nocturia 08/14/2019   Refusal of blood transfusions as patient is Jehovah's Witness     SURGICAL HISTORY: Past Surgical History:  Procedure Laterality Date   ABDOMINAL HYSTERECTOMY     BACK SURGERY     BIOPSY  12/10/2020   Procedure: BIOPSY;  Surgeon: Kerin Salen, MD;  Location: [facility] ENDOSCOPY;  Service: Gastroenterology;;   BIOPSY  12/11/2020   Procedure: BIOPSY;  Surgeon: Kerin Salen, MD;  Location: [facility] ENDOSCOPY;  Service: Gastroenterology;;   COLONOSCOPY WITH PROPOFOL N/A 12/11/2020   Procedure: COLONOSCOPY WITH PROPOFOL;  Surgeon: Kerin Salen, MD;  Location: [facility] ENDOSCOPY;  Service:  Gastroenterology;  Laterality: N/A;   ESOPHAGOGASTRODUODENOSCOPY N/A 07/17/2015   Procedure: ESOPHAGOGASTRODUODENOSCOPY (EGD);  Surgeon: Graylin Shiver, MD;  Location: [facility] ENDOSCOPY;  Service: Endoscopy;  Laterality: N/A;   ESOPHAGOGASTRODUODENOSCOPY (EGD) WITH PROPOFOL N/A 12/10/2020   Procedure: ESOPHAGOGASTRODUODENOSCOPY (EGD) WITH PROPOFOL;  Surgeon: Kerin Salen, MD;  Location: [facility] ENDOSCOPY;  Service: Gastroenterology;  Laterality: N/A;   HEMOSTASIS CLIP PLACEMENT  12/10/2020   Procedure: HEMOSTASIS CLIP PLACEMENT;  Surgeon: Kerin Salen, MD;  Location: [facility] ENDOSCOPY;  Service: Gastroenterology;;    I have reviewed the social history and family history with the patient and they are unchanged from previous note.  ALLERGIES:  is allergic to bactrim [sulfamethoxazole-trimethoprim].  MEDICATIONS:  Current Outpatient Medications  Medication Sig Dispense Refill   cyclobenzaprine (FLEXERIL) 5 MG tablet Take 1 tablet (5 mg total) by mouth 3 (three) times daily as needed for muscle spasms. 30 tablet 0   doxycycline (VIBRAMYCIN) 100 MG capsule Take 1 capsule (100 mg total) by mouth 2 (two) times daily. One po bid x 7 days 14 capsule 0   DULoxetine (CYMBALTA) 60 MG capsule Take 60 mg by mouth daily.     fluticasone (FLONASE) 50 MCG/ACT nasal spray Place 1 spray into both nostrils daily.     ketorolac (TORADOL) 60 MG/2ML SOLN injection Inject 60 mg into the muscle once.     losartan-hydrochlorothiazide (HYZAAR) 50-12.5 MG tablet Take 1 tablet by mouth daily.     metFORMIN (GLUCOPHAGE-XR) 500 MG 24 hr tablet Take 500 mg by mouth daily with breakfast.     Multiple Vitamins-Minerals (MULTIVITAMIN ADULTS  50+) TABS Take 1 tablet by mouth daily. (Patient not taking: Reported on 09/29/2022)     nitroGLYCERIN (NITROSTAT) 0.4 MG SL tablet Place 1 tablet (0.4 mg total) under the tongue every 5 (five) minutes as needed for chest pain. 30 tablet 0   pantoprazole (PROTONIX) 40 MG tablet Take 40 mg by mouth daily.      rosuvastatin (CRESTOR) 20 MG tablet Take 20 mg by mouth daily.     traMADol (ULTRAM) 50 MG tablet Take 1 tablet (50 mg total) by mouth every 12 (twelve) hours as needed. 20 tablet 0   trolamine salicylate (ASPERCREME) 10 % cream Apply 1 application topically as needed for muscle pain.     No current facility-administered medications for this visit.    PHYSICAL EXAMINATION: ECOG PERFORMANCE STATUS: 1 - Symptomatic but completely ambulatory  Vitals:   06/09/23 1212 06/09/23 1213  BP: (!) 155/93 (!) 133/95  Pulse: 72   Resp: 17   Temp: 98.1 F (36.7 C)   SpO2: 97%    Wt Readings from Last 3 Encounters:  06/09/23 183 lb 3.2 oz (83.1 kg)  04/18/23 187 lb (84.8 kg)  04/09/23 187 lb (84.8 kg)     GENERAL:alert, no distress and comfortable SKIN: skin color, texture, turgor are normal, no rashes or significant lesions EYES: normal, Conjunctiva are pink and non-injected, sclera clear NECK: supple, thyroid normal size, non-tender, without nodularity LYMPH:  no palpable lymphadenopathy in the cervical, axillary  LUNGS: clear to auscultation and percussion with normal breathing effort HEART: regular rate & rhythm and no murmurs and no lower extremity edema ABDOMEN:abdomen soft, non-tender and normal bowel sounds Musculoskeletal:no cyanosis of digits and no clubbing  NEURO: alert & oriented x 3 with fluent speech, no focal motor/sensory deficits   LABORATORY DATA:  I have reviewed the data as listed    Latest Ref Rng & Units 06/09/2023    1:52 PM 04/18/2023    8:35 AM 03/03/2023   12:41 PM  CBC  WBC 4.0 - 10.5 K/uL 5.5  6.2  6.2   Hemoglobin 12.0 - 15.0 g/dL 82.9  56.2  13.0   Hematocrit 36.0 - 46.0 % 41.4  47.2  42.5   Platelets 150 - 400 K/uL 303  409  305         Latest Ref Rng & Units 04/18/2023    8:35 AM 03/03/2023   12:41 PM 11/25/2022   10:13 AM  CMP  Glucose 70 - 99 mg/dL 865  784  696   BUN 8 - 23 mg/dL 11  7  11    Creatinine 0.44 - 1.00 mg/dL 2.95  2.84  1.32    Sodium 135 - 145 mmol/L 136  141  140   Potassium 3.5 - 5.1 mmol/L 3.7  3.9  3.8   Chloride 98 - 111 mmol/L 98  103  102   CO2 22 - 32 mmol/L 27  34  34   Calcium 8.9 - 10.3 mg/dL 9.3  9.7  9.2   Total Protein 6.5 - 8.1 g/dL 7.8  6.4  6.9   Total Bilirubin 0.3 - 1.2 mg/dL 0.8  0.5  0.6   Alkaline Phos 38 - 126 U/L 43  43  55   AST 15 - 41 U/L 20  19  19    ALT 0 - 44 U/L 24  17  23        RADIOGRAPHIC STUDIES: I have personally reviewed the radiological images as listed and agreed with the findings  in the report. No results found.    Orders Placed This Encounter  Procedures   CT CHEST ABDOMEN PELVIS W CONTRAST    Standing Status:   Future    Standing Expiration Date:   06/08/2024    Order Specific Question:   If indicated for the ordered procedure, I authorize the administration of contrast media per Radiology protocol    Answer:   Yes    Order Specific Question:   Does the patient have a contrast media/X-ray dye allergy?    Answer:   No    Order Specific Question:   Preferred imaging location?    Answer:   [facility]    Order Specific Question:   If indicated for the ordered procedure, I authorize the administration of oral contrast media per Radiology protocol    Answer:   Yes   All questions were answered. The patient knows to call the clinic with any problems, questions or concerns. No barriers to learning was detected. The total time spent in the appointment was 25 minutes.     Malachy Mood, MD 06/09/2023

## 2023-06-09 NOTE — Assessment & Plan Note (Signed)
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  Liver biopsy was attempted, but the liver lesion was not visible on ultrasound. -She started Sandostatin injection in June 2022.  Overall tolerating well. Will continue.  -last restaging CT on 09/07/2022 showed stable disease  -She is clinically doing well overall, still has intermittent pain on the left side of abdomen, overall better than last year. -She has developed hyperglycemia, possibly related to Sandostatin injection, she is on metformin now.  Discussed diabetic diet, and I encouraged her to exercise. -Restaging CT scan from February 23, 2023 showed stable disease -Continue Sandostatin injection

## 2023-06-13 ENCOUNTER — Telehealth: Payer: Self-pay | Admitting: Hematology

## 2023-06-21 NOTE — Progress Notes (Unsigned)
NEUROLOGY FOLLOW UP OFFICE NOTE  Tanya Walsh [id number]  Assessment/Plan:   Recurrent episodes of left sided numbness and dizziness, sometimes with headache, suggestive of migraine.  Small cerebral aneurysm, incidental finding  In case these episodes are migraine, will treat accordingly.  Start topiramate 25mg  at bedtime.  Side effects discussed (paresthesias, trouble with concentration, weight loss, kidney stones, glaucoma).  We can increase dose to 50mg  at bedtime in 4 weeks if needed *** Check MRA of head now to follow up on aneurysm Follow up in 4-5 months. 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  History supplemented by her accompanying daughter and ED note.   UPDATE: Repeat brain MRA not performed.   In case episodes were migraine, started topiramate 25mg  at bedtime.  ***  HISTORY: History of left sided facial numbness with chest discomfort beginning in 2020.  Numbness radiates down neck and into the left arm and hand.  She says the arm feels weak.  It may be associated with blurred vision, diaphoresis and feeling like she is going to pass out.  She has also actually passed out.  She reports associated neck and upper thoracic spinal pain and sometimes diffuse headache ("like nerves in my head").  No nausea.  Symptoms last several hours.  They occur every one to two months.  This has been evaluated in the ED.  MRI of brain without contrast on 05/08/2019 was normal.  Cardiac workup has been negative, including EKG, troponins and coronary CT angio.  CTA of head and neck on 02/15/2020 was unremarkable.  EEG on 04/30/2020 was normal.   She was seen again in the ED on 08/29/2022 for another episode of left sided facial numbness and dizziness but also endorsed right leg weakness.  No headache.  This time, she had light  and sound sensitivity and felt disoriented and has trouble walking.  MRI of brain without contrast personally reviewed showed mild chronic small vessel ischemic changes but no acute findings.  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  Again, she has some right leg pain and weakness from the back radiating into the groin and down the leg with numbness in the toes.  She has 2 prior back surgeries and has upcoming appointment with orthopedics.  Reports remote history of moderate migraines.  Current medications include:  duloxetine 60mg  daily, Flexeril 5mg  TID PRN, tramadol 50mg  BID PRN Past medications include:  sertraline, tizanidine PAST MEDICAL HISTORY: Past Medical History:  Diagnosis Date   Coronary artery disease    Depression    GI bleed 07/17/2015   Hypertension    Myocardial infarction [facility])    Nocturia 08/14/2019   Refusal of blood transfusions as patient is Jehovah's Witness     MEDICATIONS: Current Outpatient Medications on File Prior to Visit  Medication Sig Dispense Refill   cyclobenzaprine (FLEXERIL) 5 MG tablet Take 1 tablet (5 mg total) by mouth 3 (three) times daily as needed for muscle spasms. 30 tablet 0   doxycycline (VIBRAMYCIN) 100 MG capsule Take 1 capsule (100 mg total) by mouth 2 (two) times daily. One po bid x 7 days 14 capsule 0   DULoxetine (CYMBALTA) 60 MG capsule Take 60 mg by mouth daily.  fluticasone (FLONASE) 50 MCG/ACT nasal spray Place 1 spray into both nostrils daily.     ketorolac (TORADOL) 60 MG/2ML SOLN injection Inject 60 mg into the muscle once.     losartan-hydrochlorothiazide (HYZAAR) 50-12.5 MG tablet Take 1 tablet by mouth daily.     metFORMIN (GLUCOPHAGE-XR) 500 MG 24 hr tablet Take 500 mg by mouth daily with breakfast.     Multiple Vitamins-Minerals (MULTIVITAMIN ADULTS 50+) TABS Take 1 tablet by mouth daily. (Patient not taking: Reported  on 09/29/2022)     nitroGLYCERIN (NITROSTAT) 0.4 MG SL tablet Place 1 tablet (0.4 mg total) under the tongue every 5 (five) minutes as needed for chest pain. 30 tablet 0   pantoprazole (PROTONIX) 40 MG tablet Take 40 mg by mouth daily.     rosuvastatin (CRESTOR) 20 MG tablet Take 20 mg by mouth daily.     traMADol (ULTRAM) 50 MG tablet Take 1 tablet (50 mg total) by mouth every 12 (twelve) hours as needed. 20 tablet 0   trolamine salicylate (ASPERCREME) 10 % cream Apply 1 application topically as needed for muscle pain.     No current facility-administered medications on file prior to visit.    ALLERGIES: Allergies  Allergen Reactions   Bactrim [Sulfamethoxazole-Trimethoprim] Swelling    FAMILY HISTORY: Family History  Problem Relation Age of Onset   Stroke Mother    Cancer Mother        brain   Heart disease Father    Heart disease Brother    Breast cancer Niece       Objective:  *** General: No acute distress.  Patient appears well-groomed.   Head:  Normocephalic/atraumatic Eyes:  Fundi examined but not visualized Neck: supple, no paraspinal tenderness, full range of motion Heart:  Regular rate and rhythm Lungs:  Clear to auscultation bilaterally Back: right lower paraspinal tenderness Neurological Exam: Alert and oriented.  Speech fluent and not dysarthric.  Language intact.  Endorses reduced left V2-V3 sensation.  Otherwise, CN II-XII intact.  Muscle strength with some give away weakness in bilateral hip flexion and knee extension, otherwise 5/5.  Sensation to light touch intact.  Deep tendon reflexes 2+ throughout.  Finger to nose testing intact.  Gait normal.  Romberg negative.   alert and oriented to person, place, and time.  Speech fluent and not dysarthric, language intact.  Endorses reduced left V2-V3 sensation.  Otherwise, CN II-XII intact. Bulk and tone normal, muscle strength Some giveway weakness in bilateral hip flexion and knee extension, otherwise 5/5  throughout.  Sensation to pinprick and vibration intact.  Deep tendon reflexes 2+ throughout, toes downgoing.  Finger to nose testing intact.  Gait normal, Romberg negative.   Shon Millet, DO  CC: Delbert Harness, MD

## 2023-06-22 ENCOUNTER — Ambulatory Visit (INDEPENDENT_AMBULATORY_CARE_PROVIDER_SITE_OTHER): Payer: Medicare Other | Admitting: Neurology

## 2023-06-22 ENCOUNTER — Encounter: Payer: Self-pay | Admitting: Neurology

## 2023-06-22 VITALS — BP 157/99 | HR 71 | Ht 63.0 in | Wt 183.6 lb

## 2023-06-22 DIAGNOSIS — R200 Anesthesia of skin: Secondary | ICD-10-CM

## 2023-06-22 DIAGNOSIS — R202 Paresthesia of skin: Secondary | ICD-10-CM | POA: Diagnosis not present

## 2023-06-22 DIAGNOSIS — I10 Essential (primary) hypertension: Secondary | ICD-10-CM | POA: Diagnosis not present

## 2023-06-22 DIAGNOSIS — I671 Cerebral aneurysm, nonruptured: Secondary | ICD-10-CM | POA: Diagnosis not present

## 2023-06-22 NOTE — Patient Instructions (Signed)
Check MRA of head to follow up on aneurysm Check nerve study of bilateral upper extremities Follow up with the orthopedist regarding the knee Follow up with me in 6 months.

## 2023-07-04 ENCOUNTER — Ambulatory Visit
Admission: RE | Admit: 2023-07-04 | Discharge: 2023-07-04 | Disposition: A | Discharge status: Home / Self Care | Payer: Medicare Other | Source: Ambulatory Visit | Attending: Neurology

## 2023-07-04 DIAGNOSIS — I671 Cerebral aneurysm, nonruptured: Secondary | ICD-10-CM

## 2023-07-07 ENCOUNTER — Inpatient Hospital Stay: Payer: Medicare Other

## 2023-07-13 ENCOUNTER — Inpatient Hospital Stay: Payer: Medicare Other

## 2023-07-15 ENCOUNTER — Other Ambulatory Visit: Payer: Self-pay

## 2023-07-15 NOTE — Progress Notes (Signed)
Patient called in stating she is out of town and she started having bleeding with urination. Patient went to urgent care and they sent her to the ER to be seen. They done test and stated she had an infection and gave her antibiotics. They told her she needed to follow up with a urologist when she gets back home. Patient is not having symptoms at this time wanted to let Dr. Mosetta Putt know. I informed patient that she would need to call her insurance to find out who they cover.

## 2023-08-01 ENCOUNTER — Telehealth: Payer: Self-pay

## 2023-08-01 ENCOUNTER — Telehealth: Payer: Self-pay | Admitting: Neurology

## 2023-08-01 DIAGNOSIS — I671 Cerebral aneurysm, nonruptured: Secondary | ICD-10-CM

## 2023-08-01 NOTE — Telephone Encounter (Signed)
 Patient advised referral added.

## 2023-08-01 NOTE — Telephone Encounter (Signed)
-----   Message from Cira Servant sent at 07/28/2023 12:09 PM EST ----- The aneurysm is still small but it has grown a little bit.  I would like to refer to neuro-interventional radiology for evaluation and their opinion in regards to monitoring or treatment.

## 2023-08-01 NOTE — Telephone Encounter (Signed)
See results note. 

## 2023-08-01 NOTE — Telephone Encounter (Signed)
Patient is returning a call about some results.

## 2023-08-04 ENCOUNTER — Inpatient Hospital Stay: Payer: Medicare Other

## 2023-08-10 ENCOUNTER — Inpatient Hospital Stay: Payer: Medicare Other | Attending: Hematology

## 2023-08-10 VITALS — BP 132/94 | HR 86 | Temp 98.2°F | Resp 18

## 2023-08-10 DIAGNOSIS — C7A8 Other malignant neuroendocrine tumors: Secondary | ICD-10-CM | POA: Diagnosis present

## 2023-08-10 DIAGNOSIS — C7B8 Other secondary neuroendocrine tumors: Secondary | ICD-10-CM | POA: Insufficient documentation

## 2023-08-10 MED ORDER — OCTREOTIDE ACETATE 30 MG IM KIT
30.0000 mg | PACK | Freq: Once | INTRAMUSCULAR | Status: AC
  Administered 2023-08-10: 30 mg via INTRAMUSCULAR
  Filled 2023-08-10: qty 1

## 2023-08-25 ENCOUNTER — Ambulatory Visit (HOSPITAL_COMMUNITY)
Admission: RE | Admit: 2023-08-25 | Discharge: 2023-08-25 | Disposition: A | Discharge status: Home / Self Care | Payer: Medicare Other | Source: Ambulatory Visit | Attending: Hematology | Admitting: Hematology

## 2023-08-25 DIAGNOSIS — C7A8 Other malignant neuroendocrine tumors: Secondary | ICD-10-CM | POA: Diagnosis present

## 2023-08-25 MED ORDER — IOHEXOL 300 MG/ML  SOLN
100.0000 mL | Freq: Once | INTRAMUSCULAR | Status: AC | PRN
  Administered 2023-08-25: 100 mL via INTRAVENOUS

## 2023-09-01 ENCOUNTER — Inpatient Hospital Stay: Payer: Medicare Other

## 2023-09-01 ENCOUNTER — Inpatient Hospital Stay: Payer: Medicare Other | Admitting: Nurse Practitioner

## 2023-09-08 ENCOUNTER — Inpatient Hospital Stay: Payer: Medicare Other | Attending: Hematology

## 2023-09-08 ENCOUNTER — Other Ambulatory Visit: Payer: Self-pay

## 2023-09-08 ENCOUNTER — Inpatient Hospital Stay: Payer: Medicare Other | Admitting: Nurse Practitioner

## 2023-09-08 ENCOUNTER — Inpatient Hospital Stay: Payer: Medicare Other

## 2023-09-08 VITALS — BP 134/92 | HR 72 | Temp 97.4°F | Resp 15 | Wt 180.8 lb

## 2023-09-08 VITALS — BP 148/97 | HR 68 | Temp 98.3°F | Resp 17

## 2023-09-08 DIAGNOSIS — C7A8 Other malignant neuroendocrine tumors: Secondary | ICD-10-CM | POA: Insufficient documentation

## 2023-09-08 DIAGNOSIS — Z79899 Other long term (current) drug therapy: Secondary | ICD-10-CM | POA: Insufficient documentation

## 2023-09-08 DIAGNOSIS — K769 Liver disease, unspecified: Secondary | ICD-10-CM

## 2023-09-08 DIAGNOSIS — C7B8 Other secondary neuroendocrine tumors: Secondary | ICD-10-CM | POA: Diagnosis present

## 2023-09-08 DIAGNOSIS — C7A019 Malignant carcinoid tumor of the small intestine, unspecified portion: Secondary | ICD-10-CM

## 2023-09-08 LAB — CMP (CANCER CENTER ONLY)
ALT: 15 U/L (ref 0–44)
AST: 16 U/L (ref 15–41)
Albumin: 4.2 g/dL (ref 3.5–5.0)
Alkaline Phosphatase: 58 U/L (ref 38–126)
Anion gap: 5 (ref 5–15)
BUN: 10 mg/dL (ref 8–23)
CO2: 31 mmol/L (ref 22–32)
Calcium: 9.6 mg/dL (ref 8.9–10.3)
Chloride: 103 mmol/L (ref 98–111)
Creatinine: 0.89 mg/dL (ref 0.44–1.00)
GFR, Estimated: >60 mL/min (ref >60)
Glucose, Bld: 122 mg/dL — ABNORMAL HIGH (ref 70–99)
Potassium: 3.7 mmol/L (ref 3.5–5.1)
Sodium: 139 mmol/L (ref 135–145)
Total Bilirubin: 0.5 mg/dL (ref <1.2)
Total Protein: 7.4 g/dL (ref 6.5–8.1)

## 2023-09-08 LAB — CBC WITH DIFFERENTIAL (CANCER CENTER ONLY)
Abs Immature Granulocytes: 0 10*3/uL (ref 0.00–0.07)
Basophils Absolute: 0 10*3/uL (ref 0.0–0.1)
Basophils Relative: 0 %
Eosinophils Absolute: 0.2 10*3/uL (ref 0.0–0.5)
Eosinophils Relative: 3 %
HCT: 43.3 % (ref 36.0–46.0)
Hemoglobin: 13.9 g/dL (ref 12.0–15.0)
Immature Granulocytes: 0 %
Lymphocytes Relative: 35 %
Lymphs Abs: 1.7 10*3/uL (ref 0.7–4.0)
MCH: 28.4 pg (ref 26.0–34.0)
MCHC: 32.1 g/dL (ref 30.0–36.0)
MCV: 88.5 fL (ref 80.0–100.0)
Monocytes Absolute: 0.4 10*3/uL (ref 0.1–1.0)
Monocytes Relative: 8 %
Neutro Abs: 2.7 10*3/uL (ref 1.7–7.7)
Neutrophils Relative %: 54 %
Platelet Count: 317 10*3/uL (ref 150–400)
RBC: 4.89 MIL/uL (ref 3.87–5.11)
RDW: 12.7 % (ref 11.5–15.5)
WBC Count: 4.9 10*3/uL (ref 4.0–10.5)
nRBC: 0 % (ref 0.0–0.2)

## 2023-09-08 MED ORDER — OCTREOTIDE ACETATE 30 MG IM KIT
30.0000 mg | PACK | Freq: Once | INTRAMUSCULAR | Status: AC
  Administered 2023-09-08: 30 mg via INTRAMUSCULAR
  Filled 2023-09-08: qty 1

## 2023-09-08 NOTE — Progress Notes (Signed)
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  Liver biopsy was attempted, but the liver lesion was not visible on ultrasound. -She started Sandostatin injection in June 2022.  Overall tolerating well. Will continue.  -last restaging CT on 09/07/2022 showed stable disease  -She is clinically doing well overall, still has intermittent pain on the left side of abdomen, overall better than last year. -She has developed hyperglycemia, possibly related to Sandostatin injection, she is on metformin now.  Discussed diabetic diet, and I encouraged her to exercise. -Restaging CT scan from February 23, 2023 showed stable disease -Reviewed scan from 08/25/2023 with patient.  There is an increased size of neuroendocrine metastases of the right hepatic lobe of the liver.  It now measures 2.1 cm in diameter.  There is stable, nonobstructing small bowel lesions, a stable mesenteric nodule, and stable, tiny, soft tissue nodules in the posterior gluteal subcutaneous space. -Dotatate PET scan scheduled for 09/17/2023   Plan: Labs reviewed  -CBC showing WBC 4.9; Hgb 13.9; Hct 43.3; Plt 317; Anc 2.7. -CMP - K 3.7; glucose 122.; BUN 10; Creatinine 0.89; eGFR > 60; Ca 9.6; LFTs normal.   Reviewed results of  recent CT CAP with patient.  There is an increased size of neuroendocrine metastases of the right hepatic lobe of the liver.  It now measures 2.1 cm in diameter.  There is stable, nonobstructing small bowel lesions, a stable mesenteric nodule, and stable, tiny, soft tissue nodules in the posterior gluteal subcutaneous space. -NM PET dotatate scan has been ordered for further evaluation. Patient condition and labs are satisfactory for treatment.  Proceed with Sandostatin injection today. Repeat labs, follow-up, and treatment as scheduled. on 10/13/2023.  The patient understands the plans discussed today and is in agreement with them.  She knows to contact our office if she develops concerns prior to her next appointment.  I provided 25 minutes of face-to-face time during this encounter and > 50% was spent counseling as documented under my assessment and plan.    Tanya Jews, NP  [facility] - A DEPT OF [facility] [address] [place] [address] Dept: [phone] Dept Fax: [phone]   No orders of the defined types were placed in this encounter.     CHIEF COMPLAINT:  CC: primary neuroendocrine tumor of small intestine  Current Treatment:  monthly Sandostatin injections  INTERVAL HISTORY:  Tanya Walsh is here today for repeat clinical assessment. Last seen by Dr. Mosetta Putt on 06/09/2023. Continues monthly Sandostatin injections. CT Chest abdomen and pelvis with contrast was done 08/25/2023 showed increased size of neuroendocrine metastatic tumor, which now measures 2.1 cm, located in right hepatic lobe. Stable, nonobstructive small bowel lesions. Stable mesenteric lesion, and stable, tiny soft tissue nodulus in the bilateral posterior gluteal subcutaneous  space.  She denies fevers or chills. She denies pain. Her appetite is good. Her weight has decreased 3 pounds over last 2 pounds .  I have reviewed the past medical  history, past surgical history, social history and family history with the patient and they are unchanged from previous note.  ALLERGIES:  is allergic to bactrim [sulfamethoxazole-trimethoprim].  MEDICATIONS:  Current Outpatient Medications  Medication Sig Dispense Refill   cyclobenzaprine (FLEXERIL) 5 MG tablet Take 1 tablet (5 mg total) by mouth 3 (three) times daily as needed for muscle spasms. 30 tablet 0   DULoxetine (CYMBALTA) 60 MG capsule Take 60 mg by mouth daily.     fluticasone (FLONASE) 50 MCG/ACT nasal spray Place 1 spray into both nostrils daily.     losartan-hydrochlorothiazide (HYZAAR) 50-12.5 MG tablet Take 1 tablet by mouth daily.     metFORMIN (GLUCOPHAGE-XR) 500 MG 24 hr tablet Take 500 mg by mouth daily with breakfast.     Multiple Vitamins-Minerals (MULTIVITAMIN ADULTS 50+) TABS Take 1 tablet by mouth daily.     nitroGLYCERIN (NITROSTAT) 0.4 MG SL tablet Place 1 tablet (0.4 mg total) under the tongue every 5 (five) minutes as needed for chest pain. 30 tablet 0   pantoprazole (PROTONIX) 40 MG tablet Take 40 mg by mouth daily.     rosuvastatin (CRESTOR) 20 MG tablet Take 20 mg by mouth daily.     traMADol (ULTRAM) 50 MG tablet Take 1 tablet (50 mg total) by mouth every 12 (twelve) hours as needed. 20 tablet 0   trolamine salicylate (ASPERCREME) 10 % cream Apply 1 application topically as needed for muscle pain.     No current facility-administered medications for this visit.    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 Kerin Salen  Impression - Normal upper third of esophagus, middle third of esophagus and lower third of esophagus. - Z-line regular, 38 cm from the incisors. - Erythematous mucosa in the antrum. Biopsied. Clip (MR conditional) was placed. - Normal examined duodenum. Biopsied.   12/10/2020 Pathology Results   FINAL MICROSCOPIC DIAGNOSIS:   A. DUODENUM, BIOPSY:  - Duodenal mucosa with no significant pathologic findings.  - Negative for increased intraepithelial lymphocytes and villous  architectural changes.   B. STOMACH, ANTRUM, BIOPSY:  - Reactive gastropathy.  - Warthin-Starry stain is negative for Helicobacter pylori.    12/11/2020 Procedure   Colonoscopy, Dr. Kerin Salen impression - One 5 mm polyp in the transverse colon, removed piecemeal using a cold biopsy forceps. Resected and retrieved. - Diverticulosis in the sigmoid colon, in the descending colon and in the transverse colon. - The examined portion of the ileum was normal. - Non-bleeding internal hemorrhoids.   12/11/2020 Pathology Results   FINAL MICROSCOPIC DIAGNOSIS:   A. COLON, TRANSVERSE, POLYPECTOMY:  - Tubular adenoma.  - Negative for high grade dysplasia.    12/24/2020 Procedure   Capsule endoscopy report: A polypoid nodular growth was noted at 3 hours and 30 minutes and an ulcerated, polypoid growth was noted at 3 hours and 54 minutes which is likely the cause of obscure GI blood loss  01/23/2021 Imaging   CT entero AP w contrast IMPRESSION: 1. No acute findings identified within the abdomen or pelvis. 2. There are several focal areas of intraluminal hyperenhancement within the small bowel loops. The largest is in the nondilated mid to distal jejunum measuring 1.3 cm. Cannot rule out underlying small bowel neoplasm. Correlation with tissue sampling results advised. Correlation with results from capsule endoscopy. 3. There is a enhancing soft tissue nodule with central calcification within the central small bowel  mesentery. This is nonspecific and may represent sequelae of inflammation/infection. Primary differential considerations include carcinoid tumor or metastatic adenopathy. If there is a clinical concern for carcinoid tumor consider further investigation with DOTATATE PET. 4. Left adrenal gland adenoma. 5. Distal colonic diverticulosis without signs of acute diverticulitis. 6. Aortic atherosclerosis.   02/16/2021 PET scan   IMPRESSION: 1. Evidence of well differentiated small bowel neuroendocrine tumor with mesenteric metastasis. 2. Approximately 7 discrete small bowel lesions with intense radiotracer activity. Lesion depicted on comparison CT enterography. 3. Two intensely radiotracer avid mesenteric implants centrally within the small bowel mesentery. 4. Single hepatic metastasis with a second potential hepatic metastasis versus peritoneal implant. 5. No small bowel obstruction. 6. No evidence of metastatic disease outside the abdomen pelvis. 7. Patient may be a candidate for peptide receptor radiotherapy (Lu-177 DOTATATE) available at [facility] molecular imaging department [phone]).   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 No new or progressive interval findings. 2. Stable appearance of the 17 mm nodule central mesenteric nodule seen to be hypermetabolic on recent PET-CT. The second mesenteric lesion identified on that study is not discernible today. 3. Stable 1.5 cm subcapsular low-density lesion posterior right liver since PET-CT 02/16/2021. 4. 2 small bowel nodules are identified on imaging  today although 7 discrete hypermetabolic small bowel lesions were seen on previous PET-CT. 5. Left colonic diverticulosis without diverticulitis. 6. Aortic Atherosclerosis (ICD10-I70.0).   02/22/2022 Imaging   EXAM: CT ABDOMEN AND PELVIS WITH CONTRAST  IMPRESSION: 1. Mural soft tissue nodule of the small bowel in the midline ventral abdomen is unchanged, as is a small adjacent metastatic mesenteric nodule. 2. No evidence of new metastatic disease in the abdomen or pelvis. 3. Diverticulosis without evidence of acute diverticulitis. 4. Coronary artery disease.   08/25/2023 Imaging   CT chest, abdomen, and pelvis with contrast  IMPRESSION: 1. Increased size of the neuroendocrine metastasis measuring 2.1 cm in the right hepatic lobe. 2. Stable enhancing nonobstructive small bowel lesions. 3. Stable mesenteric nodule. 4. Stable tiny soft tissue nodules in the bilateral posterior gluteal subcutaneous space.       REVIEW OF SYSTEMS:   Constitutional: Denies fevers, chills or abnormal weight loss Eyes: Denies blurriness of vision Ears, nose, mouth, throat, and face: Denies mucositis or sore throat Respiratory: Denies cough, dyspnea or wheezes Cardiovascular: Denies palpitation, chest discomfort or lower extremity swelling Gastrointestinal:  Denies nausea, heartburn or change in bowel habits Skin: Denies abnormal skin rashes Lymphatics: Denies new lymphadenopathy or easy bruising Neurological:Denies numbness, tingling or new weaknesses Behavioral/Psych: Mood is stable, no new changes  All other systems were reviewed with the patient and are negative.  VITALS:   Today's Vitals   09/08/23 1108 09/08/23 1114  BP: (!) 134/92   Pulse: 72   Resp: 15   Temp: (!) 97.4 F (36.3 C)   TempSrc: Temporal   SpO2: 100%   Weight: 180 lb 12.8 oz (82 kg)   PainSc:  0-No pain   Body mass index is 32.03 kg/m.   Wt Readings from Last 3 Encounters:  09/08/23 180 lb 12.8 oz (82 kg)   06/22/23 183 lb 9.6 oz (83.3 kg)  06/09/23 183 lb 3.2 oz (83.1 kg)    Body mass index is 32.03 kg/m.  Performance status (ECOG): 1 - Symptomatic but completely ambulatory  PHYSICAL EXAM:   GENERAL:alert, no distress and comfortable SKIN: skin color, texture, turgor are normal, no rashes or significant lesions EYES: normal, Conjunctiva are pink and non-injected, sclera clear OROPHARYNX:no exudate, no erythema and lips, buccal mucosa, and tongue normal  NECK: supple, thyroid normal size, non-tender, without nodularity LYMPH:  no palpable lymphadenopathy in the cervical, axillary or inguinal LUNGS: clear to auscultation and percussion with normal breathing effort HEART: regular rate & rhythm and no murmurs and no lower extremity edema ABDOMEN:abdomen soft, non-tender and normal bowel sounds Musculoskeletal:no cyanosis of digits and no clubbing  NEURO: alert & oriented x 3 with fluent speech, no focal motor/sensory deficits  LABORATORY DATA:  I have reviewed the data as listed    Component Value Date/Time   NA 139 09/08/2023 1046   NA 141 09/03/2019 1439   K 3.7 09/08/2023 1046   CL 103 09/08/2023 1046   CO2 31 09/08/2023 1046   GLUCOSE 122 (H) 09/08/2023 1046   BUN 10 09/08/2023 1046   BUN 12 09/03/2019 1439   CREATININE 0.89 09/08/2023 1046   CALCIUM 9.6 09/08/2023 1046   PROT 7.4 09/08/2023 1046   PROT 6.6 12/25/2019 1005   ALBUMIN 4.2 09/08/2023 1046   ALBUMIN 4.0 12/25/2019 1005   AST 16 09/08/2023 1046   ALT 15 09/08/2023 1046   ALKPHOS 58 09/08/2023 1046   BILITOT 0.5 09/08/2023 1046   GFRNONAA >60 09/08/2023 1046   GFRAA >60 03/04/2020 1210   6  Lab Results  Component Value Date   WBC 4.9 09/08/2023   NEUTROABS 2.7 09/08/2023   HGB 13.9 09/08/2023   HCT 43.3 09/08/2023   MCV 88.5 09/08/2023   PLT 317 09/08/2023    RADIOGRAPHIC STUDIES: CT CHEST ABDOMEN PELVIS W CONTRAST Result Date: 08/26/2023 CLINICAL DATA:  History of neuroendocrine tumor,  follow-up. * Tracking Code: BO * EXAM: CT CHEST, ABDOMEN, AND PELVIS WITH CONTRAST TECHNIQUE: Multidetector CT imaging of the chest, abdomen and pelvis was performed following the standard protocol during bolus administration of intravenous contrast. RADIATION DOSE REDUCTION: This exam was performed according to the departmental dose-optimization program which includes automated exposure control, adjustment of the mA and/or kV according to patient size and/or use of iterative reconstruction technique. CONTRAST:  OMNIPAQUE IOHEXOL 300 MG/ML  SOLN COMPARISON:  None Available. FINDINGS: CT CHEST FINDINGS Cardiovascular: No significant vascular findings. Normal heart size. No pericardial effusion. Mediastinum/Nodes: No enlarged mediastinal, hilar, or axillary lymph nodes. Thyroid gland, trachea, and esophagus demonstrate no significant findings. Lungs/Pleura: Stable 2 mm right lower lobe pulmonary nodule on image 67/6 and 2 mm right upper lobe pulmonary nodule image 48/6. No new suspicious pulmonary nodules or masses. Musculoskeletal: No aggressive lytic or blastic lesion of bone. Multilevel degenerative change of the spine. CT ABDOMEN PELVIS FINDINGS Hepatobiliary: Increased size of the right hepatic lobe lesion  now measuring 2.1 cm previously 1.6 cm. Gallbladder is unremarkable. No biliary ductal dilation. Pancreas: No pancreatic ductal dilation or evidence of acute inflammation. Spleen: No splenomegaly. Adrenals/Urinary Tract: Stable tiny left adrenal adenoma. Right adrenal gland appears normal. No hydronephrosis. Symmetric enhancement. Urinary bladder is unremarkable for degree of distension. Stomach/Bowel: No radiopaque enteric contrast material was administered. Stomach is unremarkable for degree of distension. Normal appendix. Several enhancing nonobstructive small bowel lesions are similar prior examination for instance on image 65/2. No new suspicious lesion identified. Vascular/Lymphatic: Normal caliber  abdominal aorta. Smooth IVC contours. Mixing artifact in the IVC at the level of the renal veins. No pathologically enlarged abdominal or pelvic lymph nodes. Reproductive: Status post hysterectomy. No adnexal masses. Other: Stable mesenteric nodule measuring 12 mm on image 73/2. Tiny soft tissue nodules in the bilateral posterior gluteal subcutaneous space for instance measuring 12 mm on the left on image 83/2 Musculoskeletal: No aggressive lytic or blastic lesion of bone. IMPRESSION: 1. Increased size of the neuroendocrine metastasis measuring 2.1 cm in the right hepatic lobe. 2. Stable enhancing nonobstructive small bowel lesions. 3. Stable mesenteric nodule. 4. Stable tiny soft tissue nodules in the bilateral posterior gluteal subcutaneous space. Electronically Signed   By: Maudry Mayhew M.D.   On: 08/26/2023 11:16

## 2023-09-08 NOTE — Progress Notes (Signed)
Spoke with pt regarding PET Dotate Dr. Mosetta Putt recommended for cancer staging.  Pt is in agreement with having PET done.  Informed pt that someone from our Nuclear Medicine Team will be contacting pt to get her scheduled for her PET Scan.  Order for PET placed and has been approved by pt's insurance.

## 2023-09-11 ENCOUNTER — Encounter: Payer: Self-pay | Admitting: Hematology

## 2023-09-11 ENCOUNTER — Encounter: Payer: Self-pay | Admitting: Nurse Practitioner

## 2023-09-11 NOTE — Assessment & Plan Note (Addendum)
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  Liver biopsy was attempted, but the liver lesion was not visible on ultrasound. -She started Sandostatin injection in June 2022.  Overall tolerating well. Will continue.  -last restaging CT on 09/07/2022 showed stable disease  -She is clinically doing well overall, still has intermittent pain on the left side of abdomen, overall better than last year. -She has developed hyperglycemia, possibly related to Sandostatin injection, she is on metformin now.  Discussed diabetic diet, and I encouraged her to exercise. -Restaging CT scan from February 23, 2023 showed stable disease -Reviewed scan from 08/25/2023 with patient.  There is an increased size of neuroendocrine metastases of the right hepatic lobe of the liver.  It now measures 2.1 cm in diameter.  There is stable, nonobstructing small bowel lesions, a stable mesenteric nodule, and stable, tiny, soft tissue nodules in the posterior gluteal subcutaneous space. -Dotatate PET scan scheduled for 09/17/2023

## 2023-09-23 ENCOUNTER — Other Ambulatory Visit: Payer: Self-pay

## 2023-10-07 ENCOUNTER — Encounter (HOSPITAL_COMMUNITY)
Admission: RE | Admit: 2023-10-07 | Discharge: 2023-10-07 | Disposition: A | Discharge status: Home / Self Care | Payer: Medicare Other | Source: Ambulatory Visit | Attending: Nurse Practitioner | Admitting: Nurse Practitioner

## 2023-10-07 DIAGNOSIS — C787 Secondary malignant neoplasm of liver and intrahepatic bile duct: Secondary | ICD-10-CM | POA: Insufficient documentation

## 2023-10-07 DIAGNOSIS — K769 Liver disease, unspecified: Secondary | ICD-10-CM | POA: Insufficient documentation

## 2023-10-07 DIAGNOSIS — C801 Malignant (primary) neoplasm, unspecified: Secondary | ICD-10-CM | POA: Diagnosis not present

## 2023-10-07 MED ORDER — COPPER CU 64 DOTATATE 1 MCI/ML IV SOLN
4.0000 | Freq: Once | INTRAVENOUS | Status: AC
  Administered 2023-10-07: 4 via INTRAVENOUS

## 2023-10-13 ENCOUNTER — Encounter: Payer: Self-pay | Admitting: Hematology

## 2023-10-13 ENCOUNTER — Inpatient Hospital Stay: Payer: Medicare Other | Admitting: Hematology

## 2023-10-13 ENCOUNTER — Inpatient Hospital Stay: Payer: Medicare Other | Attending: Hematology

## 2023-10-13 ENCOUNTER — Inpatient Hospital Stay: Payer: Medicare Other

## 2023-10-13 VITALS — BP 137/91 | HR 65 | Temp 97.5°F | Resp 18 | Wt 181.4 lb

## 2023-10-13 VITALS — BP 144/90 | HR 64 | Temp 98.5°F | Resp 18

## 2023-10-13 DIAGNOSIS — C7A8 Other malignant neuroendocrine tumors: Secondary | ICD-10-CM | POA: Insufficient documentation

## 2023-10-13 DIAGNOSIS — C7B8 Other secondary neuroendocrine tumors: Secondary | ICD-10-CM | POA: Insufficient documentation

## 2023-10-13 DIAGNOSIS — Z79899 Other long term (current) drug therapy: Secondary | ICD-10-CM | POA: Insufficient documentation

## 2023-10-13 DIAGNOSIS — C7A019 Malignant carcinoid tumor of the small intestine, unspecified portion: Secondary | ICD-10-CM

## 2023-10-13 LAB — CMP (CANCER CENTER ONLY)
ALT: 12 U/L (ref 0–44)
AST: 16 U/L (ref 15–41)
Albumin: 4 g/dL (ref 3.5–5.0)
Alkaline Phosphatase: 53 U/L (ref 38–126)
Anion gap: 4 — ABNORMAL LOW (ref 5–15)
BUN: 7 mg/dL — ABNORMAL LOW (ref 8–23)
CO2: 30 mmol/L (ref 22–32)
Calcium: 9.2 mg/dL (ref 8.9–10.3)
Chloride: 105 mmol/L (ref 98–111)
Creatinine: 0.91 mg/dL (ref 0.44–1.00)
GFR, Estimated: >60 mL/min (ref >60)
Glucose, Bld: 136 mg/dL — ABNORMAL HIGH (ref 70–99)
Potassium: 4.2 mmol/L (ref 3.5–5.1)
Sodium: 139 mmol/L (ref 135–145)
Total Bilirubin: 0.6 mg/dL (ref 0.0–1.2)
Total Protein: 6.7 g/dL (ref 6.5–8.1)

## 2023-10-13 LAB — CBC WITH DIFFERENTIAL (CANCER CENTER ONLY)
Abs Immature Granulocytes: 0.01 10*3/uL (ref 0.00–0.07)
Basophils Absolute: 0 10*3/uL (ref 0.0–0.1)
Basophils Relative: 1 %
Eosinophils Absolute: 0.1 10*3/uL (ref 0.0–0.5)
Eosinophils Relative: 2 %
HCT: 41.2 % (ref 36.0–46.0)
Hemoglobin: 13.4 g/dL (ref 12.0–15.0)
Immature Granulocytes: 0 %
Lymphocytes Relative: 35 %
Lymphs Abs: 1.6 10*3/uL (ref 0.7–4.0)
MCH: 28.9 pg (ref 26.0–34.0)
MCHC: 32.5 g/dL (ref 30.0–36.0)
MCV: 89 fL (ref 80.0–100.0)
Monocytes Absolute: 0.4 10*3/uL (ref 0.1–1.0)
Monocytes Relative: 9 %
Neutro Abs: 2.4 10*3/uL (ref 1.7–7.7)
Neutrophils Relative %: 53 %
Platelet Count: 287 10*3/uL (ref 150–400)
RBC: 4.63 MIL/uL (ref 3.87–5.11)
RDW: 12.4 % (ref 11.5–15.5)
WBC Count: 4.5 10*3/uL (ref 4.0–10.5)
nRBC: 0 % (ref 0.0–0.2)

## 2023-10-13 MED ORDER — OCTREOTIDE ACETATE 30 MG IM KIT
30.0000 mg | PACK | Freq: Once | INTRAMUSCULAR | Status: AC
  Administered 2023-10-13: 30 mg via INTRAMUSCULAR
  Filled 2023-10-13: qty 1

## 2023-10-13 NOTE — Assessment & Plan Note (Signed)
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  Liver biopsy was attempted, but the liver lesion was not visible on ultrasound. -She started Sandostatin injection in June 2022.  Overall tolerating well. Will continue.  -last restaging CT on 09/07/2022 showed stable disease  -She is clinically doing well overall, still has intermittent pain on the left side of abdomen, overall better than last year. -She has developed hyperglycemia, possibly related to Sandostatin injection, she is on metformin now.  Discussed diabetic diet, and I encouraged her to exercise. -Restaging CT scan from February 23, 2023 showed stable disease -CT in 08/2023 and dotatate PET scan in 09/2023 showed progression in liver  -will refer her for Lutathera therapy

## 2023-10-13 NOTE — Progress Notes (Signed)
[facility]   [phone] [phone]   Clinic Follow up Note   Patient Care Team: Macy Mis, MD as PCP - General (Family Medicine) Quintella Reichert, MD as PCP - Sleep Medicine (Cardiology) Lyn Records, MD (Inactive) as PCP - Cardiology (Cardiology) Drema Dallas, DO as Consulting Physician (Neurology) Pollyann Samples, NP as Nurse Practitioner (Nurse Practitioner) Malachy Mood, MD as Consulting Physician (Oncology) Radonna Ricker, RN (Inactive) as Oncology Nurse Navigator Drema Dallas, DO as Consulting Physician (Neurology)  Date of Service:  10/13/2023  CHIEF COMPLAINT: f/u of neuroendocrine tumor  CURRENT THERAPY:  Sandostatin injection every 2 months  Oncology History   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  Liver biopsy was attempted, but the liver lesion was not visible on ultrasound. -She started Sandostatin injection in June 2022.  Overall tolerating well. Will continue.  -last restaging CT on 09/07/2022 showed stable disease  -She is clinically doing well overall, still has intermittent pain on the left side of abdomen, overall better than last year. -She has developed hyperglycemia, possibly related to Sandostatin injection, she is on metformin now.  Discussed diabetic diet, and I encouraged her to exercise. -Restaging CT scan from February 23, 2023 showed stable disease -CT in 08/2023 and dotatate PET scan in 09/2023 showed progression in liver  -will refer her for Lutathera therapy     Assessment and Plan    Metastatic Neuroendocrine Tumor Metastatic neuroendocrine tumor originating from the small intestine with metastasis to the liver and mesenteric and retroperitoneal lymph nodes. Recent DOTA PET scan shows increased uptake in the right hepatic lobe lesion, indicating progression from  1.3 cm to 2.2 cm. No new lesions identified; Reports increased body pain, particularly in the right leg, back, and stomach, and softer bowel movements. Current treatment with Sandostatin injections has been ongoing for 2-3 years without significant tumor reduction. Discussed alternative treatments including Lutathera (Lutathera) and Afinitor. Lutathera is an infusion treatment administered every two months for eight months, which can shrink the tumor and is more effective than the current injection. Afinitor is an oral medication that can cause fatigue, low blood counts, and mucositis but is generally well-tolerated. Emphasized the need for proactive treatment to prevent potential bowel obstruction due to tumor growth in the small intestine. - Administer Sandostatin injection therapy today - Schedule next injection in one to two months - Provide printed information on Lutathera and Afinitor - she will discuss treatment options with family and call me back with her decision  - Schedule follow-up appointment in two months  General Health Maintenance Emphasized the importance of regular follow-ups and monitoring to prevent complications such as bowel obstruction. - Continue current injection therapy - Consider alternative treatments (Lutathera or Afinitor) - Schedule follow-up appointment in two months  Plan -Dotatate PET scan reviewed, mild disease progression in liver and small intestine -Will proceed Sandostatin injection today and continue every months for now -I recommend Lutathera, also discussed option of Afinitor -Patient would like to discuss with her family, and coming back with her decision - Schedule follow-up appointment in two months - Adjust follow-up schedule if proceeding with Lutathera treatment.         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 Kerin Salen Impression - Normal upper third of esophagus, middle third of esophagus and lower third of esophagus. - Z-line regular, 38 cm from the incisors. - Erythematous mucosa in the antrum. Biopsied. Clip (MR conditional) was placed. - Normal examined duodenum. Biopsied.   12/10/2020 Pathology Results   FINAL MICROSCOPIC DIAGNOSIS:   A. DUODENUM, BIOPSY:  - Duodenal mucosa with no significant pathologic findings.  - Negative for increased intraepithelial lymphocytes and villous  architectural changes.   B. STOMACH, ANTRUM, BIOPSY:  - Reactive gastropathy.  - Warthin-Starry stain is negative for Helicobacter pylori.    12/11/2020 Procedure   Colonoscopy, Dr. Kerin Salen impression - One 5 mm polyp in the transverse colon, removed piecemeal using a cold biopsy forceps. Resected and retrieved. - Diverticulosis in the sigmoid colon, in the descending colon and in the transverse colon. - The examined portion of the ileum was normal. - Non-bleeding internal hemorrhoids.   12/11/2020 Pathology Results   FINAL MICROSCOPIC DIAGNOSIS:   A. COLON, TRANSVERSE, POLYPECTOMY:  - Tubular adenoma.  - Negative for high grade dysplasia.    12/24/2020 Procedure   Capsule endoscopy report: A polypoid nodular growth was noted at 3 hours and 30 minutes and an ulcerated, polypoid growth was noted at 3 hours and 54 minutes which is likely the cause of obscure GI blood loss   01/23/2021 Imaging   CT entero AP w contrast IMPRESSION: 1. No acute findings identified within the abdomen or pelvis. 2. There are several focal areas of intraluminal hyperenhancement within the small bowel loops.  The largest is in the nondilated mid to distal jejunum measuring 1.3 cm. Cannot rule out underlying small bowel neoplasm. Correlation with tissue sampling results advised. Correlation with results from capsule endoscopy. 3. There is a enhancing soft tissue nodule with central calcification within the central small bowel mesentery. This is nonspecific and may represent sequelae of inflammation/infection. Primary differential considerations include carcinoid tumor or metastatic adenopathy. If there is a clinical concern for carcinoid tumor consider further investigation with DOTATATE PET. 4. Left adrenal gland adenoma. 5. Distal colonic diverticulosis without signs of acute diverticulitis. 6. Aortic atherosclerosis.   02/16/2021 PET scan   IMPRESSION: 1. Evidence of well differentiated small bowel neuroendocrine tumor with mesenteric metastasis. 2. Approximately 7 discrete small bowel lesions with intense radiotracer activity. Lesion depicted on comparison CT enterography. 3. Two intensely radiotracer avid mesenteric implants centrally within the small bowel mesentery. 4. Single hepatic metastasis with a second potential hepatic metastasis versus peritoneal implant. 5. No small bowel obstruction. 6. No evidence of metastatic disease outside the abdomen pelvis. 7. Patient may be a candidate for peptide receptor radiotherapy (Lu-177 DOTATATE) available at [facility] molecular imaging department [phone]).   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 No new or progressive interval findings. 2. Stable  appearance of the 17 mm nodule central mesenteric nodule seen to  be hypermetabolic on recent PET-CT. The second mesenteric lesion identified on that study is not discernible today. 3. Stable 1.5 cm subcapsular low-density lesion posterior right liver since PET-CT 02/16/2021. 4. 2 small bowel nodules are identified on imaging today although 7 discrete hypermetabolic small bowel lesions were seen on previous PET-CT. 5. Left colonic diverticulosis without diverticulitis. 6. Aortic Atherosclerosis (ICD10-I70.0).   02/22/2022 Imaging   EXAM: CT ABDOMEN AND PELVIS WITH CONTRAST  IMPRESSION: 1. Mural soft tissue nodule of the small bowel in the midline ventral abdomen is unchanged, as is a small adjacent metastatic mesenteric nodule. 2. No evidence of new metastatic disease in the abdomen or pelvis. 3. Diverticulosis without evidence of acute diverticulitis. 4. Coronary artery disease.   08/25/2023 Imaging   CT chest, abdomen, and pelvis with contrast  IMPRESSION: 1. Increased size of the neuroendocrine metastasis measuring 2.1 cm in the right hepatic lobe. 2. Stable enhancing nonobstructive small bowel lesions. 3. Stable mesenteric nodule. 4. Stable tiny soft tissue nodules in the bilateral posterior gluteal subcutaneous space.      Discussed the use of AI scribe software for clinical note transcription with the patient, who gave verbal consent to proceed.  History of Present Illness   The patient, a 68 year old with a history of metastatic neuroendocrine tumor, presents with increased body pain, particularly in the right leg and back. The pain is not constant but was severe about one to two weeks ago. The patient also reports changes in bowel movements, which have become softer and occur one to two times a day. The patient also reports feeling more tired than usual. The patient denies any problems from the injection treatment she is currently receiving.         All other systems were  reviewed with the patient and are negative.  MEDICAL HISTORY:  Past Medical History:  Diagnosis Date   Coronary artery disease    Depression    GI bleed 07/17/2015   Hypertension    Myocardial infarction [facility])    Nocturia 08/14/2019   Refusal of blood transfusions as patient is Jehovah's Witness     SURGICAL HISTORY: Past Surgical History:  Procedure Laterality Date   ABDOMINAL HYSTERECTOMY     BACK SURGERY     BIOPSY  12/10/2020   Procedure: BIOPSY;  Surgeon: Kerin Salen, MD;  Location: [facility] ENDOSCOPY;  Service: Gastroenterology;;   BIOPSY  12/11/2020   Procedure: BIOPSY;  Surgeon: Kerin Salen, MD;  Location: [facility] ENDOSCOPY;  Service: Gastroenterology;;   COLONOSCOPY WITH PROPOFOL N/A 12/11/2020   Procedure: COLONOSCOPY WITH PROPOFOL;  Surgeon: Kerin Salen, MD;  Location: [facility] ENDOSCOPY;  Service: Gastroenterology;  Laterality: N/A;   ESOPHAGOGASTRODUODENOSCOPY N/A 07/17/2015   Procedure: ESOPHAGOGASTRODUODENOSCOPY (EGD);  Surgeon: Graylin Shiver, MD;  Location: [facility] ENDOSCOPY;  Service: Endoscopy;  Laterality: N/A;   ESOPHAGOGASTRODUODENOSCOPY (EGD) WITH PROPOFOL N/A 12/10/2020   Procedure: ESOPHAGOGASTRODUODENOSCOPY (EGD) WITH PROPOFOL;  Surgeon: Kerin Salen, MD;  Location: [facility] ENDOSCOPY;  Service: Gastroenterology;  Laterality: N/A;   HEMOSTASIS CLIP PLACEMENT  12/10/2020   Procedure: HEMOSTASIS CLIP PLACEMENT;  Surgeon: Kerin Salen, MD;  Location: [facility] ENDOSCOPY;  Service: Gastroenterology;;    I have reviewed the social history and family history with the patient and they are unchanged from previous note.  ALLERGIES:  is allergic to bactrim [sulfamethoxazole-trimethoprim].  MEDICATIONS:  Current Outpatient Medications  Medication Sig Dispense Refill   cyclobenzaprine (FLEXERIL) 5 MG tablet Take 1 tablet (5 mg total) by mouth 3 (three) times daily as needed for muscle spasms.  30 tablet 0   DULoxetine (CYMBALTA) 60 MG capsule Take 60 mg by mouth daily.     fluticasone (FLONASE) 50 MCG/ACT  nasal spray Place 1 spray into both nostrils daily.     losartan-hydrochlorothiazide (HYZAAR) 50-12.5 MG tablet Take 1 tablet by mouth daily.     metFORMIN (GLUCOPHAGE-XR) 500 MG 24 hr tablet Take 500 mg by mouth daily with breakfast.     Multiple Vitamins-Minerals (MULTIVITAMIN ADULTS 50+) TABS Take 1 tablet by mouth daily.     nitroGLYCERIN (NITROSTAT) 0.4 MG SL tablet Place 1 tablet (0.4 mg total) under the tongue every 5 (five) minutes as needed for chest pain. 30 tablet 0   pantoprazole (PROTONIX) 40 MG tablet Take 40 mg by mouth daily.     rosuvastatin (CRESTOR) 20 MG tablet Take 20 mg by mouth daily.     traMADol (ULTRAM) 50 MG tablet Take 1 tablet (50 mg total) by mouth every 12 (twelve) hours as needed. 20 tablet 0   trolamine salicylate (ASPERCREME) 10 % cream Apply 1 application topically as needed for muscle pain.     No current facility-administered medications for this visit.    PHYSICAL EXAMINATION: ECOG PERFORMANCE STATUS: 1 - Symptomatic but completely ambulatory  Vitals:   10/13/23 1044 10/13/23 1046  BP: (!) 143/96 (!) 137/91  Pulse: 65   Resp: 18   Temp: (!) 97.5 F (36.4 C)   SpO2: 100%    Wt Readings from Last 3 Encounters:  10/13/23 181 lb 6.4 oz (82.3 kg)  09/08/23 180 lb 12.8 oz (82 kg)  06/22/23 183 lb 9.6 oz (83.3 kg)     GENERAL:alert, no distress and comfortable SKIN: skin color, texture, turgor are normal, no rashes or significant lesions EYES: normal, Conjunctiva are pink and non-injected, sclera clear NECK: supple, thyroid normal size, non-tender, without nodularity LYMPH:  no palpable lymphadenopathy in the cervical, axillary  LUNGS: clear to auscultation and percussion with normal breathing effort HEART: regular rate & rhythm and no murmurs and no lower extremity edema ABDOMEN:abdomen soft, non-tender and normal bowel sounds Musculoskeletal:no cyanosis of digits and no clubbing  NEURO: alert & oriented x 3 with fluent speech, no focal  motor/sensory deficits    LABORATORY DATA:  I have reviewed the data as listed    Latest Ref Rng & Units 10/13/2023   10:24 AM 09/08/2023   10:46 AM 06/09/2023    1:52 PM  CBC  WBC 4.0 - 10.5 K/uL 4.5  4.9  5.5   Hemoglobin 12.0 - 15.0 g/dL 16.1  09.6  04.5   Hematocrit 36.0 - 46.0 % 41.2  43.3  41.4   Platelets 150 - 400 K/uL 287  317  303         Latest Ref Rng & Units 10/13/2023   10:24 AM 09/08/2023   10:46 AM 06/09/2023    1:52 PM  CMP  Glucose 70 - 99 mg/dL 409  811  914   BUN 8 - 23 mg/dL 7  10  11    Creatinine 0.44 - 1.00 mg/dL 7.82  9.56  2.13   Sodium 135 - 145 mmol/L 139  139  140   Potassium 3.5 - 5.1 mmol/L 4.2  3.7  3.6   Chloride 98 - 111 mmol/L 105  103  103   CO2 22 - 32 mmol/L 30  31  32   Calcium 8.9 - 10.3 mg/dL 9.2  9.6  9.6   Total Protein 6.5 - 8.1 g/dL 6.7  7.4  7.2   Total Bilirubin 0.0 - 1.2 mg/dL 0.6  0.5  0.6   Alkaline Phos 38 - 126 U/L 53  58  49   AST 15 - 41 U/L 16  16  20    ALT 0 - 44 U/L 12  15  23        RADIOGRAPHIC STUDIES: I have personally reviewed the radiological images as listed and agreed with the findings in the report. No results found.    No orders of the defined types were placed in this encounter.  All questions were answered. The patient knows to call the clinic with any problems, questions or concerns. No barriers to learning was detected. The total time spent in the appointment was 30 minutes.     Malachy Mood, MD 10/13/2023

## 2023-11-08 ENCOUNTER — Other Ambulatory Visit: Payer: Self-pay

## 2023-11-10 ENCOUNTER — Encounter: Payer: Self-pay | Admitting: Hematology

## 2023-11-10 ENCOUNTER — Inpatient Hospital Stay: Payer: Medicare Other | Attending: Hematology

## 2023-11-10 VITALS — BP 143/98 | HR 78 | Temp 98.9°F | Resp 18

## 2023-11-10 DIAGNOSIS — C7A8 Other malignant neuroendocrine tumors: Secondary | ICD-10-CM | POA: Diagnosis present

## 2023-11-10 DIAGNOSIS — C7B8 Other secondary neuroendocrine tumors: Secondary | ICD-10-CM | POA: Insufficient documentation

## 2023-11-10 MED ORDER — OCTREOTIDE ACETATE 30 MG IM KIT
30.0000 mg | PACK | Freq: Once | INTRAMUSCULAR | Status: AC
Start: 2023-11-10 — End: 2023-11-10
  Administered 2023-11-10: 30 mg via INTRAMUSCULAR
  Filled 2023-11-10: qty 1

## 2023-11-15 ENCOUNTER — Other Ambulatory Visit: Payer: Self-pay | Admitting: Hematology

## 2023-11-15 ENCOUNTER — Telehealth: Payer: Self-pay

## 2023-11-15 ENCOUNTER — Other Ambulatory Visit: Payer: Self-pay

## 2023-11-15 MED ORDER — TRAMADOL HCL 50 MG PO TABS: 50.0000 mg | ORAL_TABLET | Freq: Two times a day (BID) | ORAL | 0 refills | Status: AC | PRN

## 2023-11-15 NOTE — Telephone Encounter (Signed)
 Patient called in stating she needs a refill of her Tramadol 50 mg. Please call into the pharmacy [facility] on [address].

## 2023-11-16 ENCOUNTER — Encounter: Payer: Self-pay | Admitting: Hematology

## 2023-12-08 ENCOUNTER — Other Ambulatory Visit (HOSPITAL_COMMUNITY): Payer: Self-pay | Admitting: Hematology

## 2023-12-08 ENCOUNTER — Encounter: Payer: Self-pay | Admitting: Hematology

## 2023-12-08 ENCOUNTER — Inpatient Hospital Stay: Payer: Medicare Other | Attending: Hematology

## 2023-12-08 ENCOUNTER — Inpatient Hospital Stay: Payer: Medicare Other | Admitting: Hematology

## 2023-12-08 ENCOUNTER — Inpatient Hospital Stay: Payer: Medicare Other

## 2023-12-08 VITALS — BP 163/97 | HR 84 | Temp 98.0°F | Resp 17 | Wt 177.5 lb

## 2023-12-08 DIAGNOSIS — C7A8 Other malignant neuroendocrine tumors: Secondary | ICD-10-CM

## 2023-12-08 DIAGNOSIS — C7B8 Other secondary neuroendocrine tumors: Secondary | ICD-10-CM | POA: Diagnosis present

## 2023-12-08 DIAGNOSIS — C7A019 Malignant carcinoid tumor of the small intestine, unspecified portion: Secondary | ICD-10-CM

## 2023-12-08 DIAGNOSIS — Z79899 Other long term (current) drug therapy: Secondary | ICD-10-CM | POA: Diagnosis not present

## 2023-12-08 LAB — CBC WITH DIFFERENTIAL (CANCER CENTER ONLY)
Abs Immature Granulocytes: 0.01 10*3/uL (ref 0.00–0.07)
Basophils Absolute: 0 10*3/uL (ref 0.0–0.1)
Basophils Relative: 1 %
Eosinophils Absolute: 0.2 10*3/uL (ref 0.0–0.5)
Eosinophils Relative: 3 %
HCT: 44.6 % (ref 36.0–46.0)
Hemoglobin: 14.5 g/dL (ref 12.0–15.0)
Immature Granulocytes: 0 %
Lymphocytes Relative: 39 %
Lymphs Abs: 2.1 10*3/uL (ref 0.7–4.0)
MCH: 28.5 pg (ref 26.0–34.0)
MCHC: 32.5 g/dL (ref 30.0–36.0)
MCV: 87.8 fL (ref 80.0–100.0)
Monocytes Absolute: 0.4 10*3/uL (ref 0.1–1.0)
Monocytes Relative: 8 %
Neutro Abs: 2.5 10*3/uL (ref 1.7–7.7)
Neutrophils Relative %: 49 %
Platelet Count: 334 10*3/uL (ref 150–400)
RBC: 5.08 MIL/uL (ref 3.87–5.11)
RDW: 12.7 % (ref 11.5–15.5)
WBC Count: 5.2 10*3/uL (ref 4.0–10.5)
nRBC: 0 % (ref 0.0–0.2)

## 2023-12-08 LAB — CMP (CANCER CENTER ONLY)
ALT: 14 U/L (ref 0–44)
AST: 18 U/L (ref 15–41)
Albumin: 4.4 g/dL (ref 3.5–5.0)
Alkaline Phosphatase: 61 U/L (ref 38–126)
Anion gap: 5 (ref 5–15)
BUN: 13 mg/dL (ref 8–23)
CO2: 31 mmol/L (ref 22–32)
Calcium: 9.6 mg/dL (ref 8.9–10.3)
Chloride: 104 mmol/L (ref 98–111)
Creatinine: 0.93 mg/dL (ref 0.44–1.00)
GFR, Estimated: >60 mL/min (ref >60)
Glucose, Bld: 126 mg/dL — ABNORMAL HIGH (ref 70–99)
Potassium: 3.8 mmol/L (ref 3.5–5.1)
Sodium: 140 mmol/L (ref 135–145)
Total Bilirubin: 0.6 mg/dL (ref 0.0–1.2)
Total Protein: 7.6 g/dL (ref 6.5–8.1)

## 2023-12-08 MED ORDER — OCTREOTIDE ACETATE 30 MG IM KIT
30.0000 mg | PACK | Freq: Once | INTRAMUSCULAR | Status: AC
  Administered 2023-12-08: 30 mg via INTRAMUSCULAR
  Filled 2023-12-08: qty 1

## 2023-12-08 NOTE — Progress Notes (Signed)
 [facility]   Telephone:([phone]) [phone] Fax:(336) [phone]   Clinic Follow up Note   Patient Care Team: Macy Mis, MD as PCP - General (Family Medicine) Quintella Reichert, MD as PCP - Sleep Medicine (Cardiology) Lyn Records, MD (Inactive) as PCP - Cardiology (Cardiology) Drema Dallas, DO as Consulting Physician (Neurology) Pollyann Samples, NP as Nurse Practitioner (Nurse Practitioner) Malachy Mood, MD as Consulting Physician (Oncology) Radonna Ricker, RN (Inactive) as Oncology Nurse Navigator Drema Dallas, DO as Consulting Physician (Neurology)  Date of Service:  12/08/2023  CHIEF COMPLAINT: f/u of metastatic neuroendocrine tumor  CURRENT THERAPY:  Sandostatin injection monthly  Oncology History   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  Liver biopsy was attempted, but the liver lesion was not visible on ultrasound. -She started Sandostatin injection in June 2022.  Overall tolerating well. Will continue.  -last restaging CT on 09/07/2022 showed stable disease  -She is clinically doing well overall, still has intermittent pain on the left side of abdomen, overall better than last year. -She has developed hyperglycemia, possibly related to Sandostatin injection, she is on metformin now.  Discussed diabetic diet, and I encouraged her to exercise. -Restaging CT scan from February 23, 2023 showed stable disease -CT in 08/2023 and dotatate PET scan in 09/2023 showed progression in liver  -will refer her for Lutathera therapy    Assessment and Plan    Metastatic neuroendocrine tumor She has a metastatic neuroendocrine tumor, initially diagnosed in 2022, with progression in the liver, small intestine, and lymph nodes. Recent scans show new liver lesions and increased small intestine lesions. The tumor is absent in the colon and  bones. She is considering Lutathera (infusion therapy) and Afinitor (oral medication).  Benefit and potential side effects of this 2 treatments were discussed with her and her friend in detail. As a Jehovah's Witness, she avoids blood transfusions, influencing her treatment choices.  After lengthy discussion, she agreed to try Lutathera - Refer to Dr. Amil Amen for [facility] treatment consultation. - Administer monthly injection for symptom management  - Schedule follow-up appointment in three months.  Goals of Care She prioritizes quality of life while managing her metastatic neuroendocrine tumor. As a Jehovah's Witness, she avoids blood transfusions, affecting her treatment decisions. She is open to [place] due to its potential benefits in stabilizing the disease and possibly extending life expectancy, despite not being curative. - Discuss quality of life expectations and treatment outcomes with Dr. Charlott Holler during Francina Ames consultation.      Plan -I again reviewed her recent PET scan images, which showed disease progression in liver. -We discussed the treatment options of Lutathera and Afinitor, she agreed with Lutathera therapy -Will refer her to nuclear medicine Dr. Amil Amen  -Will proceed Sandostatin injection today and continue every months.  Follow-up in 3 months.     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 Kerin Salen Impression - Normal upper third of esophagus, middle third of esophagus and lower third of  esophagus. - Z-line regular, 38 cm from the  incisors. - Erythematous mucosa in the antrum. Biopsied. Clip (MR conditional) was placed. - Normal examined duodenum. Biopsied.   12/10/2020 Pathology Results   FINAL MICROSCOPIC DIAGNOSIS:   A. DUODENUM, BIOPSY:  - Duodenal mucosa with no significant pathologic findings.  - Negative for increased intraepithelial lymphocytes and villous  architectural changes.   B. STOMACH, ANTRUM, BIOPSY:  - Reactive gastropathy.  - Warthin-Starry stain is negative for Helicobacter pylori.    12/11/2020 Procedure   Colonoscopy, Dr. Kerin Salen impression - One 5 mm polyp in the transverse colon, removed piecemeal using a cold biopsy forceps. Resected and retrieved. - Diverticulosis in the sigmoid colon, in the descending colon and in the transverse colon. - The examined portion of the ileum was normal. - Non-bleeding internal hemorrhoids.   12/11/2020 Pathology Results   FINAL MICROSCOPIC DIAGNOSIS:   A. COLON, TRANSVERSE, POLYPECTOMY:  - Tubular adenoma.  - Negative for high grade dysplasia.    12/24/2020 Procedure   Capsule endoscopy report: A polypoid nodular growth was noted at 3 hours and 30 minutes and an ulcerated, polypoid growth was noted at 3 hours and 54 minutes which is likely the cause of obscure GI blood loss   01/23/2021 Imaging   CT entero AP w contrast IMPRESSION: 1. No acute findings identified within the abdomen or pelvis. 2. There are several focal areas of intraluminal hyperenhancement within the small bowel loops. The largest is in the nondilated mid to distal jejunum measuring 1.3 cm. Cannot rule out underlying small bowel neoplasm. Correlation with tissue sampling results advised. Correlation with results from capsule endoscopy. 3. There is a enhancing soft tissue nodule with central calcification within the central small bowel mesentery. This is nonspecific and may represent sequelae of inflammation/infection. Primary differential considerations include carcinoid  tumor or metastatic adenopathy. If there is a clinical concern for carcinoid tumor consider further investigation with DOTATATE PET. 4. Left adrenal gland adenoma. 5. Distal colonic diverticulosis without signs of acute diverticulitis. 6. Aortic atherosclerosis.   02/16/2021 PET scan   IMPRESSION: 1. Evidence of well differentiated small bowel neuroendocrine tumor with mesenteric metastasis. 2. Approximately 7 discrete small bowel lesions with intense radiotracer activity. Lesion depicted on comparison CT enterography. 3. Two intensely radiotracer avid mesenteric implants centrally within the small bowel mesentery. 4. Single hepatic metastasis with a second potential hepatic metastasis versus peritoneal implant. 5. No small bowel obstruction. 6. No evidence of metastatic disease outside the abdomen pelvis. 7. Patient may be a candidate for peptide receptor radiotherapy (Lu-177 DOTATATE) available at [facility] molecular imaging department [phone]).   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 No new or progressive interval findings. 2. Stable appearance of the 17 mm nodule central mesenteric nodule seen to be hypermetabolic on recent PET-CT. The second mesenteric lesion identified on that study is not discernible today. 3. Stable 1.5 cm subcapsular low-density lesion posterior right liver since PET-CT 02/16/2021. 4. 2 small bowel nodules are identified on imaging today although 7 discrete hypermetabolic small bowel lesions were seen on previous PET-CT. 5. Left colonic diverticulosis without  diverticulitis. 6. Aortic Atherosclerosis (ICD10-I70.0).  02/22/2022 Imaging   EXAM: CT ABDOMEN AND PELVIS WITH CONTRAST  IMPRESSION: 1. Mural soft tissue nodule of the small bowel in the midline ventral abdomen is unchanged, as is a small adjacent metastatic mesenteric nodule. 2. No evidence of new metastatic disease in the abdomen or pelvis. 3. Diverticulosis without evidence of acute diverticulitis. 4. Coronary artery disease.   08/25/2023 Imaging   CT chest, abdomen, and pelvis with contrast  IMPRESSION: 1. Increased size of the neuroendocrine metastasis measuring 2.1 cm in the right hepatic lobe. 2. Stable enhancing nonobstructive small bowel lesions. 3. Stable mesenteric nodule. 4. Stable tiny soft tissue nodules in the bilateral posterior gluteal subcutaneous space.      Discussed the use of AI scribe software for clinical note transcription with the patient, who gave verbal consent to proceed.  History of Present Illness   The patient, a 68 year old female with a known diagnosis of metastatic neuroendocrine tumor, presents for a follow-up visit. The patient was initially diagnosed in 2022 and has been managing the disease for almost three years. The disease has shown some progression, with new multiple lesions within the small intestine and an enlargement of the intensive avid lesion within the right lobe of the liver. The patient reports changes in bowel movements, with stools becoming softer and less solid than before. The patient expresses concerns about the quality of life with potential treatments and the progression of the disease.         All other systems were reviewed with the patient and are negative.  MEDICAL HISTORY:  Past Medical History:  Diagnosis Date   Coronary artery disease    Depression    GI bleed 07/17/2015   Hypertension    Myocardial infarction [facility])    Nocturia 08/14/2019   Refusal of blood transfusions as patient is Jehovah's Witness      SURGICAL HISTORY: Past Surgical History:  Procedure Laterality Date   ABDOMINAL HYSTERECTOMY     BACK SURGERY     BIOPSY  12/10/2020   Procedure: BIOPSY;  Surgeon: Kerin Salen, MD;  Location: [facility] ENDOSCOPY;  Service: Gastroenterology;;   BIOPSY  12/11/2020   Procedure: BIOPSY;  Surgeon: Kerin Salen, MD;  Location: [facility] ENDOSCOPY;  Service: Gastroenterology;;   COLONOSCOPY WITH PROPOFOL N/A 12/11/2020   Procedure: COLONOSCOPY WITH PROPOFOL;  Surgeon: Kerin Salen, MD;  Location: [facility] ENDOSCOPY;  Service: Gastroenterology;  Laterality: N/A;   ESOPHAGOGASTRODUODENOSCOPY N/A 07/17/2015   Procedure: ESOPHAGOGASTRODUODENOSCOPY (EGD);  Surgeon: Graylin Shiver, MD;  Location: [facility] ENDOSCOPY;  Service: Endoscopy;  Laterality: N/A;   ESOPHAGOGASTRODUODENOSCOPY (EGD) WITH PROPOFOL N/A 12/10/2020   Procedure: ESOPHAGOGASTRODUODENOSCOPY (EGD) WITH PROPOFOL;  Surgeon: Kerin Salen, MD;  Location: [facility] ENDOSCOPY;  Service: Gastroenterology;  Laterality: N/A;   HEMOSTASIS CLIP PLACEMENT  12/10/2020   Procedure: HEMOSTASIS CLIP PLACEMENT;  Surgeon: Kerin Salen, MD;  Location: [facility] ENDOSCOPY;  Service: Gastroenterology;;    I have reviewed the social history and family history with the patient and they are unchanged from previous note.  ALLERGIES:  is allergic to bactrim [sulfamethoxazole-trimethoprim].  MEDICATIONS:  Current Outpatient Medications  Medication Sig Dispense Refill   cyclobenzaprine (FLEXERIL) 5 MG tablet Take 1 tablet (5 mg total) by mouth 3 (three) times daily as needed for muscle spasms. 30 tablet 0   DULoxetine (CYMBALTA) 60 MG capsule Take 60 mg by mouth daily.     fluticasone (FLONASE) 50 MCG/ACT nasal spray Place 1 spray into both nostrils daily.     losartan-hydrochlorothiazide (HYZAAR) 50-12.5 MG tablet Take 1 tablet  by mouth daily.     metFORMIN (GLUCOPHAGE-XR) 500 MG 24 hr tablet Take 500 mg by mouth daily with breakfast.     Multiple Vitamins-Minerals (MULTIVITAMIN ADULTS 50+) TABS Take 1  tablet by mouth daily.     nitroGLYCERIN (NITROSTAT) 0.4 MG SL tablet Place 1 tablet (0.4 mg total) under the tongue every 5 (five) minutes as needed for chest pain. 30 tablet 0   pantoprazole (PROTONIX) 40 MG tablet Take 40 mg by mouth daily.     rosuvastatin (CRESTOR) 20 MG tablet Take 20 mg by mouth daily.     traMADol (ULTRAM) 50 MG tablet Take 1 tablet (50 mg total) by mouth every 12 (twelve) hours as needed. 20 tablet 0   trolamine salicylate (ASPERCREME) 10 % cream Apply 1 application topically as needed for muscle pain.     No current facility-administered medications for this visit.    PHYSICAL EXAMINATION: ECOG PERFORMANCE STATUS: 0 - Asymptomatic  Vitals:   12/08/23 1104 12/08/23 1105  BP: (!) 175/113 (!) 163/97  Pulse: 84   Resp: 17   Temp: 98 F (36.7 C)   SpO2: 98%    Wt Readings from Last 3 Encounters:  12/08/23 177 lb 8 oz (80.5 kg)  10/13/23 181 lb 6.4 oz (82.3 kg)  09/08/23 180 lb 12.8 oz (82 kg)     GENERAL:alert, no distress and comfortable SKIN: skin color, texture, turgor are normal, no rashes or significant lesions EYES: normal, Conjunctiva are pink and non-injected, sclera clear Musculoskeletal:no cyanosis of digits and no clubbing  NEURO: alert & oriented x 3 with fluent speech, no focal motor/sensory deficits    LABORATORY DATA:  I have reviewed the data as listed    Latest Ref Rng & Units 12/08/2023   10:32 AM 10/13/2023   10:24 AM 09/08/2023   10:46 AM  CBC  WBC 4.0 - 10.5 K/uL 5.2  4.5  4.9   Hemoglobin 12.0 - 15.0 g/dL 16.1  09.6  04.5   Hematocrit 36.0 - 46.0 % 44.6  41.2  43.3   Platelets 150 - 400 K/uL 334  287  317         Latest Ref Rng & Units 12/08/2023   10:32 AM 10/13/2023   10:24 AM 09/08/2023   10:46 AM  CMP  Glucose 70 - 99 mg/dL 409  811  914   BUN 8 - 23 mg/dL 13  7  10    Creatinine 0.44 - 1.00 mg/dL 7.82  9.56  2.13   Sodium 135 - 145 mmol/L 140  139  139   Potassium 3.5 - 5.1 mmol/L 3.8  4.2  3.7   Chloride 98 -  111 mmol/L 104  105  103   CO2 22 - 32 mmol/L 31  30  31    Calcium 8.9 - 10.3 mg/dL 9.6  9.2  9.6   Total Protein 6.5 - 8.1 g/dL 7.6  6.7  7.4   Total Bilirubin 0.0 - 1.2 mg/dL 0.6  0.6  0.5   Alkaline Phos 38 - 126 U/L 61  53  58   AST 15 - 41 U/L 18  16  16    ALT 0 - 44 U/L 14  12  15        RADIOGRAPHIC STUDIES: I have personally reviewed the radiological images as listed and agreed with the findings in the report. No results found.    No orders of the defined types were placed in this encounter.  All questions were answered.  The patient knows to call the clinic with any problems, questions or concerns. No barriers to learning was detected. The total time spent in the appointment was 30 minutes.     Malachy Mood, MD 12/08/2023

## 2023-12-08 NOTE — Assessment & Plan Note (Signed)
 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  Liver biopsy was attempted, but the liver lesion was not visible on ultrasound. -She started Sandostatin injection in June 2022.  Overall tolerating well. Will continue.  -last restaging CT on 09/07/2022 showed stable disease  -She is clinically doing well overall, still has intermittent pain on the left side of abdomen, overall better than last year. -She has developed hyperglycemia, possibly related to Sandostatin injection, she is on metformin now.  Discussed diabetic diet, and I encouraged her to exercise. -Restaging CT scan from February 23, 2023 showed stable disease -CT in 08/2023 and dotatate PET scan in 09/2023 showed progression in liver  -will refer her for Lutathera therapy

## 2023-12-09 ENCOUNTER — Telehealth: Payer: Self-pay | Admitting: Hematology

## 2023-12-09 ENCOUNTER — Other Ambulatory Visit: Payer: Self-pay

## 2023-12-09 NOTE — Telephone Encounter (Signed)
 Scheduled appointments per 3/27 los. Called and left VM with appointment details.

## 2023-12-14 DIAGNOSIS — I671 Cerebral aneurysm, nonruptured: Secondary | ICD-10-CM | POA: Insufficient documentation

## 2023-12-15 ENCOUNTER — Encounter (HOSPITAL_COMMUNITY)

## 2023-12-20 NOTE — Progress Notes (Unsigned)
 NEUROLOGY FOLLOW UP OFFICE NOTE  Tanya Walsh [id number]  Assessment/Plan:   Bilateral upper extremity numbness and tingling, primarily left sided -  evaluate for carpal tunnel syndrome Left left numbness in setting of patient with chronic low back pain s/p surgeries - may be residual Questionable migraine with aura, stable Small cerebral aneurysm, incidental finding Hypertension  Check NCV-EMG of upper extremities Check MRA of head Request records/imaging reports from her orthopedist Follow up with orthopedist regarding left knee Follow up with PCP regarding blood pressure Follow up in 6 months Total time spent in chart and face to face with patient:  31 minutes.  Subjective:  Tanya Walsh is a 68 year old right-handed female with CAD s/p MI, metastatic malignant endocrine tumor to the liver, DM II, fibromyalgia, OSA and HTN who follows up for recent ED visit.  History supplemented by her accompanying daughter and ED note.   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  She was referred to neuro-endovascular radiology ***  Still has numbness in the arms/hands and sometimes the left leg.  NCV-EMG of upper extremities was ordered but never scheduled.  ***   HISTORY: History of left sided facial numbness with chest discomfort beginning in 2020.  Numbness radiates down neck and into the left arm and hand.  She says the arm feels weak.  It may be associated with blurred vision, diaphoresis and feeling like she is going to pass out.  She has also actually passed out.  She reports associated neck and upper thoracic spinal pain and sometimes diffuse headache ("like nerves in my head").  No nausea.  Symptoms last several hours.  They occur every one to two months.  This has been evaluated in the ED.  MRI of brain without contrast on  05/08/2019 was normal.  Cardiac workup has been negative, including EKG, troponins and coronary CT angio.  CTA of head and neck on 02/15/2020 was unremarkable.  EEG on 04/30/2020 was normal.   She was seen again in the ED on 08/29/2022 for another episode of left sided facial numbness and dizziness but also endorsed right leg weakness.  No headache.  This time, she had light and sound sensitivity and felt disoriented and has trouble walking.  MRI of brain without contrast personally reviewed showed mild chronic small vessel ischemic changes but no acute findings.  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  Again, she has some right leg pain and weakness from the back radiating into the groin and down the leg with numbness in the toes.  She has 2 prior back surgeries.  Reports remote history of moderate migraines.  Still has numbness in the arms/hands.  She has history of left hip pain, left knee pain and bilateral lumbar radiculopathy.  She did see her orthopedist and reportedly had imaging of her lumbar spine performed (not available for review).  She received left trochanteric bursa injection and bilateral L4-5 transforaminal epidural injection, as well as left knee intra-articular injection by her orthopedist which reportedly wee effective.  Current medications include:  duloxetine 60mg  daily, Flexeril 5mg  TID PRN, tramadol 50mg  BID PRN Past medications include:  sertraline, tizanidine PAST MEDICAL HISTORY: Past Medical History:  Diagnosis Date   Coronary artery disease  Depression    GI bleed 07/17/2015   Hypertension    Myocardial infarction [facility])    Nocturia 08/14/2019   Refusal of blood transfusions as patient is Jehovah's Witness     MEDICATIONS: Current Outpatient Medications on File Prior to Visit  Medication Sig Dispense Refill   cyclobenzaprine (FLEXERIL) 5 MG tablet Take 1  tablet (5 mg total) by mouth 3 (three) times daily as needed for muscle spasms. 30 tablet 0   DULoxetine (CYMBALTA) 60 MG capsule Take 60 mg by mouth daily.     fluticasone (FLONASE) 50 MCG/ACT nasal spray Place 1 spray into both nostrils daily.     losartan-hydrochlorothiazide (HYZAAR) 50-12.5 MG tablet Take 1 tablet by mouth daily.     metFORMIN (GLUCOPHAGE-XR) 500 MG 24 hr tablet Take 500 mg by mouth daily with breakfast.     Multiple Vitamins-Minerals (MULTIVITAMIN ADULTS 50+) TABS Take 1 tablet by mouth daily.     nitroGLYCERIN (NITROSTAT) 0.4 MG SL tablet Place 1 tablet (0.4 mg total) under the tongue every 5 (five) minutes as needed for chest pain. 30 tablet 0   pantoprazole (PROTONIX) 40 MG tablet Take 40 mg by mouth daily.     rosuvastatin (CRESTOR) 20 MG tablet Take 20 mg by mouth daily.     traMADol (ULTRAM) 50 MG tablet Take 1 tablet (50 mg total) by mouth every 12 (twelve) hours as needed. 20 tablet 0   trolamine salicylate (ASPERCREME) 10 % cream Apply 1 application topically as needed for muscle pain.     No current facility-administered medications on file prior to visit.    ALLERGIES: Allergies  Allergen Reactions   Bactrim [Sulfamethoxazole-Trimethoprim] Swelling    FAMILY HISTORY: Family History  Problem Relation Age of Onset   Stroke Mother    Cancer Mother        brain   Heart disease Father    Heart disease Brother    Breast cancer Niece       Objective:  *** General: No acute distress.  Patient appears well-groomed.   ***  Shon Millet, DO  CC: Delbert Harness, MD

## 2023-12-21 ENCOUNTER — Other Ambulatory Visit: Payer: Self-pay | Admitting: Hematology

## 2023-12-21 ENCOUNTER — Encounter: Payer: Self-pay | Admitting: Neurology

## 2023-12-21 ENCOUNTER — Ambulatory Visit: Payer: Medicare Other | Admitting: Neurology

## 2023-12-21 VITALS — BP 148/70 | HR 70 | Ht 63.0 in | Wt 186.3 lb

## 2023-12-21 DIAGNOSIS — R519 Headache, unspecified: Secondary | ICD-10-CM | POA: Diagnosis not present

## 2023-12-21 DIAGNOSIS — I10 Essential (primary) hypertension: Secondary | ICD-10-CM

## 2023-12-21 DIAGNOSIS — R200 Anesthesia of skin: Secondary | ICD-10-CM

## 2023-12-21 DIAGNOSIS — C7A8 Other malignant neuroendocrine tumors: Secondary | ICD-10-CM

## 2023-12-21 DIAGNOSIS — M5416 Radiculopathy, lumbar region: Secondary | ICD-10-CM | POA: Diagnosis not present

## 2023-12-21 DIAGNOSIS — I671 Cerebral aneurysm, nonruptured: Secondary | ICD-10-CM

## 2023-12-21 DIAGNOSIS — R202 Paresthesia of skin: Secondary | ICD-10-CM

## 2023-12-21 MED ORDER — GABAPENTIN 100 MG PO CAPS: 100.0000 mg | ORAL_CAPSULE | Freq: Every day | ORAL | 5 refills | Status: DC

## 2023-12-21 NOTE — Patient Instructions (Signed)
 Start gabapentin 100mg  at bedtime.  If no improvement in one week, contact me Nerve conduction study of both hands Follow up 6 months.

## 2023-12-22 ENCOUNTER — Ambulatory Visit (HOSPITAL_COMMUNITY)
Admission: RE | Admit: 2023-12-22 | Discharge: 2023-12-22 | Disposition: A | Discharge status: Home / Self Care | Source: Ambulatory Visit | Attending: Hematology | Admitting: Hematology

## 2023-12-22 DIAGNOSIS — C7A8 Other malignant neuroendocrine tumors: Secondary | ICD-10-CM | POA: Insufficient documentation

## 2023-12-22 NOTE — Consult Note (Addendum)
 Chief Complaint: Metastatic well differentiated neuroendocrine tumor.  Assessment for Peptide receptor radiotherapy.  Referring Physician(s):    Patient Status: [facility] - Out-pt  History of Present Illness: Tanya Walsh is a 68 y.o. female Well differentiated neuroendocrine tumor diagnosed 2022.  Patient underwent upper endoscopy for abdominal pain.  Gastric polyp was biopsied and revealed well differentiated neuroendocrine tumor.     Subsequent DOTATATE PET (February 16, 2021) scan revealed multiple foci metastatic disease associated the small bowel.  Liver metastasis noted.     Patient has been on Sandostatin monthly injections.     Chromogranin A  relatively stable at 30-40.     Recently patient has experienced fatigue.   Follow-up DOTATATE PET scan 10/07/2023 reveal progression well differentiated neuroendocrine tumor within liver and bowel.     Patient presents for evaluation for LU  177 DOTATATE therapy.    Past Medical History:  Diagnosis Date   Coronary artery disease    Depression    GI bleed 07/17/2015   Hypertension    Myocardial infarction [facility])    Nocturia 08/14/2019   Refusal of blood transfusions as patient is Jehovah's Witness     Past Surgical History:  Procedure Laterality Date   ABDOMINAL HYSTERECTOMY     BACK SURGERY     BIOPSY  12/10/2020   Procedure: BIOPSY;  Surgeon: Genell Ken, MD;  Location: [facility] ENDOSCOPY;  Service: Gastroenterology;;   BIOPSY  12/11/2020   Procedure: BIOPSY;  Surgeon: Genell Ken, MD;  Location: [facility] ENDOSCOPY;  Service: Gastroenterology;;   COLONOSCOPY WITH PROPOFOL N/A 12/11/2020   Procedure: COLONOSCOPY WITH PROPOFOL;  Surgeon: Genell Ken, MD;  Location: [facility] ENDOSCOPY;  Service: Gastroenterology;  Laterality: N/A;   ESOPHAGOGASTRODUODENOSCOPY N/A 07/17/2015   Procedure: ESOPHAGOGASTRODUODENOSCOPY (EGD);  Surgeon: Celedonio Coil, MD;  Location: [facility] ENDOSCOPY;  Service: Endoscopy;  Laterality: N/A;    ESOPHAGOGASTRODUODENOSCOPY (EGD) WITH PROPOFOL N/A 12/10/2020   Procedure: ESOPHAGOGASTRODUODENOSCOPY (EGD) WITH PROPOFOL;  Surgeon: Genell Ken, MD;  Location: [facility] ENDOSCOPY;  Service: Gastroenterology;  Laterality: N/A;   HEMOSTASIS CLIP PLACEMENT  12/10/2020   Procedure: HEMOSTASIS CLIP PLACEMENT;  Surgeon: Genell Ken, MD;  Location: [facility] ENDOSCOPY;  Service: Gastroenterology;;    Allergies: Bactrim [sulfamethoxazole-trimethoprim]  Medications: Prior to Admission medications   Medication Sig Start Date End Date Taking? Authorizing Provider  cyclobenzaprine (FLEXERIL) 5 MG tablet Take 1 tablet (5 mg total) by mouth 3 (three) times daily as needed for muscle spasms. 12/11/20   Memon, Jehanzeb, MD  DULoxetine (CYMBALTA) 60 MG capsule Take 60 mg by mouth daily.    [provider]  fluticasone (FLONASE) 50 MCG/ACT nasal spray Place 1 spray into both nostrils daily. 11/11/21   [provider]  gabapentin (NEURONTIN) 100 MG capsule Take 1 capsule (100 mg total) by mouth at bedtime. 12/21/23   Merriam Abbey, DO  losartan-hydrochlorothiazide (HYZAAR) 50-12.5 MG tablet Take 1 tablet by mouth daily. 03/21/18   [provider]  metFORMIN (GLUCOPHAGE-XR) 500 MG 24 hr tablet Take 500 mg by mouth daily with breakfast. 09/07/22   [provider]  Multiple Vitamins-Minerals (MULTIVITAMIN ADULTS 50+) TABS Take 1 tablet by mouth daily.    [provider]  nitroGLYCERIN (NITROSTAT) 0.4 MG SL tablet Place 1 tablet (0.4 mg total) under the tongue every 5 (five) minutes as needed for chest pain. Patient not taking: Reported on 12/21/2023 03/29/18   Alissa April, MD  pantoprazole (PROTONIX) 40 MG tablet Take 40 mg by mouth daily.    [provider]  rosuvastatin (CRESTOR) 20 MG tablet Take 20 mg by mouth daily.    [provider]  traMADol (ULTRAM) 50 MG tablet Take 1 tablet (50 mg total) by mouth every 12 (twelve) hours as needed. 11/15/23   Sonja Clontarf, MD  trolamine  salicylate (ASPERCREME) 10 % cream Apply 1 application topically as needed for muscle pain.    [provider]     Family History  Problem Relation Age of Onset   Stroke Mother    Cancer Mother        brain   Heart disease Father    Heart disease Brother    Breast cancer Niece     Social History   Socioeconomic History   Marital status: Widowed    Spouse name: Not on file   Number of children: 3   Years of education: Not on file   Highest education level: Not on file  Occupational History   Not on file  Tobacco Use   Smoking status: Never   Smokeless tobacco: Never  Vaping Use   Vaping status: Never Used  Substance and Sexual Activity   Alcohol use: Yes    Comment: wine rarely   Drug use: No   Sexual activity: Not on file  Other Topics Concern   Not on file  Social History Narrative   Right handed   Drinks caffeine   One story home   Social Drivers of Health   Financial Resource Strain: Low Risk  (12/14/2023)   Received from [facility]   Overall Financial Resource Strain (CARDIA)    Difficulty of Paying Living Expenses: Not hard at all  Food Insecurity: No Food Insecurity (12/14/2023)   Received from [facility]   Hunger Vital Sign    Worried About Running Out of Food in the Last Year: Never true    Ran Out of Food in the Last Year: Never true  Transportation Needs: No Transportation Needs (12/14/2023)   Received from [facility] - Transportation    Lack of Transportation (Medical): No    Lack of Transportation (Non-Medical): No  Physical Activity: Insufficiently Active (12/14/2023)   Received from [facility]   Exercise Vital Sign    Days of Exercise per Week: 1 day    Minutes of Exercise per Session: 10 min  Stress: Stress Concern Present (12/14/2023)   Received from [facility] - Occupational Stress Questionnaire    Feeling of Stress : To some extent  Social Connections: Socially  Integrated (12/14/2023)   Received from [facility]   Social Network    How would you rate your social network (family, work, friends)?: Good participation with social networks    ECOG Status: 1 - Symptomatic but completely ambulatory    Review of Systems  Mild diarrhea. No flushing. Fatigue.  Change in stool consistency  Vital Signs: There were no vitals taken for this visit.  Physical Exam Well appearing female.    Imaging: NM PET DOTATATE SKULL BASE TO MID THIGH Result Date: 10/07/2023 CLINICAL DATA:  History of neuroendocrine tumor.  3.9 EXAM: NUCLEAR MEDICINE PET SKULL BASE TO THIGH TECHNIQUE: 3.9 mCi copper 64 DOTATATE was injected intravenously. Full-ring PET imaging was performed from the skull base to thigh after the radiotracer. CT data was obtained and used for attenuation correction and anatomic localization. COMPARISON:  CT 09/02/2023, DOTATATE PET scan 02/16/2021 FINDINGS: NECK No radiotracer activity in neck lymph nodes. Incidental CT findings:  None CHEST No radiotracer accumulation within mediastinal or hilar lymph nodes. No suspicious pulmonary nodules on the CT scan. Incidental CT finding:None ABDOMEN/PELVIS Within the subcapsular RIGHT hepatic lobe there is a low-density lesion measuring 2.2 cm with intense radiotracer activity (SUV max equal 26.5). This is enlarged from 13 mm on comparison DOTATATE PET scan which time lesion was also a radiotracer avid SUV max equal 15. No new lesions within liver. Several mesenteric implants and small-bowel lesion again noted in central abdomen. For example partially calcified central mesenteric nodule measuring 10 mm (image 132) with SUV max equal 25.9 compares to 12 mm with SUV max equal 16.7. Adjacent lesion in the small bowel which is difficult define on CT but fairly intense with SUV max equal 18.9 on image 131 comparison 27. There multiple small bowel lesions. Example lesion in the RIGHT lower quadrant/iliac fossa with SUV max of  17.5. No clear CT correlation. The number of lesions small bowel appears slightly increased but difficult to correlate directly with the small bowel changes in orientation. No evidence of bowel small bowel obstruction. Physiologic activity noted in the liver, spleen, adrenal glands and kidneys. Incidental CT findings:None SKELETON No focal activity to suggest skeletal metastasis. Incidental CT findings:None IMPRESSION: 1. Clear enlargement of intensely radiotracer avid lesions within the RIGHT hepatic lobe. New hepatic metastasis. 2. Multifocal lesions within the small bowel appears slightly increased in number compared to prior but difficult to compare directly to small-bowel migration. 3. Intensely radiotracer avid mesenteric and retroperitoneal nodes not changed from prior. 4. No evidence of bowel obstruction. 5. No evidence skeletal metastasis. Electronically Signed   By: Deboraha Fallow M.D.   On: 10/07/2023 12:09    Labs: No results for input(s): "PSA", "PSA1" in the last 8760 hours.  CBC: Recent Labs    06/09/23 1352 09/08/23 1046 10/13/23 1024 12/08/23 1032  WBC 5.5 4.9 4.5 5.2  HGB 13.3 13.9 13.4 14.5  HCT 41.4 43.3 41.2 44.6  PLT 303 317 287 334    COAGS: No results for input(s): "INR", "APTT" in the last 8760 hours.  BMP: Recent Labs    06/09/23 1352 09/08/23 1046 10/13/23 1024 12/08/23 1032  NA 140 139 139 140  K 3.6 3.7 4.2 3.8  CL 103 103 105 104  CO2 32 31 30 31   GLUCOSE 137* 122* 136* 126*  BUN 11 10 7* 13  CALCIUM 9.6 9.6 9.2 9.6  CREATININE 0.84 0.89 0.91 0.93  GFRNONAA >60 >60 >60 >60    LIVER FUNCTION TESTS: Recent Labs    06/09/23 1352 09/08/23 1046 10/13/23 1024 12/08/23 1032  BILITOT 0.6 0.5 0.6 0.6  AST 20 16 16 18   ALT 23 15 12 14   ALKPHOS 49 58 53 61  PROT 7.2 7.4 6.7 7.6  ALBUMIN 4.2 4.2 4.0 4.4    TUMOR MARKERS: Recent Labs    03/03/23 1241  CHROMOGA 43.2    Assessment and Plan:  [Patient is a candidate for peptide receptor  radiotherapy.  Patient has well differentiated neuroendocrine tumor identified within the liver and small bowel.  The tumor is positive for DOTATATE/ somatostatin receptors.  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   The patient was counseled on the primary goal of therapy which is prolongation of progression free survival (79% improvement over standard therapy).  Secondary goals would include decrease in tumor burden and decrease in carcinoid symptoms.    Primary of toxicities of therapy were explained to patient including  marrow suppression, renal toxicity and hepatic toxicity.  Patient accompanied by friend. Potential toxicity will be monitored throughout the course of therapy with interval  CBC and CMP laboratory evaluation.    Four therapies will be scheduled  2 months apart over a  six-month interval.  Patient will receive IM Sandostatin injection in the molecular imaging department after each therapy.  Patient will return to oncology clinic 1 month following each therapy for CBC and CMP and IM Sandostatin injection.   First therapy scheduled for 12/27/23.  Thank you for this interesting consult.  I greatly enjoyed meeting Clydean P Josten and look forward to participating in their care.  A copy of this report was sent to the requesting provider on this date.  Electronically Signed: Reino Carbo, MD 12/22/2023, 12:52 PM   I spent a total of  15 Minutes   in face to face in clinical consultation, greater than 50% of which was counseling/coordinating care for metastatic neuroendocrine tumor.

## 2023-12-23 ENCOUNTER — Ambulatory Visit: Admitting: Neurology

## 2023-12-23 ENCOUNTER — Encounter: Admitting: Neurology

## 2023-12-23 DIAGNOSIS — R200 Anesthesia of skin: Secondary | ICD-10-CM | POA: Diagnosis not present

## 2023-12-23 DIAGNOSIS — R202 Paresthesia of skin: Secondary | ICD-10-CM

## 2023-12-23 NOTE — Procedures (Signed)
 [facility] Neurology  [address] [place], [address]  [place], [place] [address] Tel: [phone] Fax: [phone] Test Date:  12/23/2023  Patient: Tanya Walsh DOB: 1956/01/11 Physician: Nita Sickle, DO  Sex: Female Height: 5\' 3"  Ref Phys: Shon Millet, DO  ID#: [id number]   Technician:    History: This is a 68 year old female referred for evaluation of bilateral upper extremity paresthesias.  NCV & EMG Findings: Extensive electrodiagnostic testing of the right upper extremity and additional studies of the left shows: Bilateral median, ulnar, and mixed palmar sensory responses are within normal limits. Bilateral median and ulnar motor responses are within normal limits. There is no evidence of active or chronic motor axonal loss changes affecting any of the tested muscles.  Motor unit configuration and recruitment pattern is within normal limits.   Impression: This is a normal study of the upper extremities.  In particular, there is no evidence of carpal tunnel syndrome or a cervical radiculopathy.   ___________________________ Nita Sickle, DO    Nerve Conduction Studies   Stim Site NR Peak (ms) Norm Peak (ms) O-P Amp (V) Norm O-P Amp  Left Median Anti Sensory (2nd Digit)  32 C  Wrist    3.4 <3.8 26.5 >10  Right Median Anti Sensory (2nd Digit)  32 C  Wrist    3.4 <3.8 20.8 >10  Left Ulnar Anti Sensory (5th Digit)  32 C  Wrist    3.2 <3.2 23.6 >5  Right Ulnar Anti Sensory (5th Digit)  32 C  Wrist    3.0 <3.2 20.0 >5     Stim Site NR Onset (ms) Norm Onset (ms) O-P Amp (mV) Norm O-P Amp Site1 Site2 Delta-0 (ms) Dist (cm) Vel (m/s) Norm Vel (m/s)  Left Median Motor (Abd Poll Brev)  32 C  Wrist    3.1 <4.0 11.6 >5 Elbow Wrist 5.4 30.0 56 >50  Elbow    8.5  11.0         Right Median Motor (Abd Poll Brev)  32 C  Wrist    2.9 <4.0 13.4 >5 Elbow Wrist 5.6 30.0 54 >50  Elbow    8.5  12.4         Left Ulnar Motor (Abd Dig Minimi)  32 C  Wrist    2.5 <3.1 10.3 >7 B  Elbow Wrist 3.8 24.0 63 >50  B Elbow    6.3  9.9  A Elbow B Elbow 1.7 10.0 59 >50  A Elbow    8.0  9.5         Right Ulnar Motor (Abd Dig Minimi)  32 C  Wrist    2.3 <3.1 9.7 >7 B Elbow Wrist 3.6 24.0 67 >50  B Elbow    5.9  9.6  A Elbow B Elbow 1.6 10.0 63 >50  A Elbow    7.5  9.2            Stim Site NR Peak (ms) Norm Peak (ms) P-T Amp (V) Site1 Site2 Delta-P (ms) Norm Delta (ms)  Left Median/Ulnar Palm Comparison (Wrist - 8cm)  32 C  Median Palm    1.6 <2.2 37.1 Median Palm Ulnar Palm 0.2   Ulnar Palm    1.8 <2.2 19.9      Right Median/Ulnar Palm Comparison (Wrist - 8cm)  32 C  Median Palm    1.8 <2.2 31.7 Median Palm Ulnar Palm 0.1   Ulnar Palm    1.7 <2.2 8.2  Electromyography   Side Muscle Ins.Act Fibs Fasc Recrt Amp Dur Poly Activation Comment  Right 1stDorInt Nml Nml Nml Nml Nml Nml Nml Nml N/A  Right PronatorTeres Nml Nml Nml Nml Nml Nml Nml Nml N/A  Right Biceps Nml Nml Nml Nml Nml Nml Nml Nml N/A  Right Triceps Nml Nml Nml Nml Nml Nml Nml Nml N/A  Right Deltoid Nml Nml Nml Nml Nml Nml Nml Nml N/A  Left 1stDorInt Nml Nml Nml Nml Nml Nml Nml Nml N/A  Left PronatorTeres Nml Nml Nml Nml Nml Nml Nml Nml N/A  Left Biceps Nml Nml Nml Nml Nml Nml Nml Nml N/A  Left Triceps Nml Nml Nml Nml Nml Nml Nml Nml N/A  Left Deltoid Nml Nml Nml Nml Nml Nml Nml Nml N/A      Waveforms:

## 2023-12-26 NOTE — Written Directive (Cosign Needed)
 LUTATHERA THERAPY   RADIOPHARMACEUTICAL:  Lutetium 177 Dotatate (Lutathera)     PRESCRIBED DOSE FOR ADMINISTRATION:  200 mCi   ROUTE OFADMINISTRATION:  IV   DIAGNOSIS:  Primary malignant neuroendocrine tumor of small intestine    REFERRING PHYSICIAN: Dr. Maryalice Smaller    TREATMENT #: 1    DATE OF LAST LONG LIVED SOMATOSTATIN INJECTION: Octreotide 12/08/2023    ADDITIONAL PHYSICIAN COMMENTS/NOTES:   AUTHORIZED USER SIGNATURE & TIME STAMP:  Reino Carbo, MD   12/27/23    8:10 AM

## 2023-12-27 ENCOUNTER — Encounter (HOSPITAL_COMMUNITY)
Admission: RE | Admit: 2023-12-27 | Discharge: 2023-12-27 | Disposition: A | Discharge status: Home / Self Care | Source: Ambulatory Visit | Attending: Hematology | Admitting: Hematology

## 2023-12-27 VITALS — BP 130/60 | HR 68

## 2023-12-27 DIAGNOSIS — I214 Non-ST elevation (NSTEMI) myocardial infarction: Secondary | ICD-10-CM | POA: Diagnosis not present

## 2023-12-27 DIAGNOSIS — R079 Chest pain, unspecified: Secondary | ICD-10-CM | POA: Diagnosis not present

## 2023-12-27 DIAGNOSIS — C7A8 Other malignant neuroendocrine tumors: Secondary | ICD-10-CM | POA: Insufficient documentation

## 2023-12-27 LAB — CBC WITH DIFFERENTIAL/PLATELET
Abs Immature Granulocytes: 0.01 10*3/uL (ref 0.00–0.07)
Basophils Absolute: 0 10*3/uL (ref 0.0–0.1)
Basophils Relative: 1 %
Eosinophils Absolute: 0.2 10*3/uL (ref 0.0–0.5)
Eosinophils Relative: 4 %
HCT: 44.4 % (ref 36.0–46.0)
Hemoglobin: 14.2 g/dL (ref 12.0–15.0)
Immature Granulocytes: 0 %
Lymphocytes Relative: 32 %
Lymphs Abs: 1.5 10*3/uL (ref 0.7–4.0)
MCH: 29 pg (ref 26.0–34.0)
MCHC: 32 g/dL (ref 30.0–36.0)
MCV: 90.8 fL (ref 80.0–100.0)
Monocytes Absolute: 0.3 10*3/uL (ref 0.1–1.0)
Monocytes Relative: 7 %
Neutro Abs: 2.8 10*3/uL (ref 1.7–7.7)
Neutrophils Relative %: 56 %
Platelets: 292 10*3/uL (ref 150–400)
RBC: 4.89 MIL/uL (ref 3.87–5.11)
RDW: 12.9 % (ref 11.5–15.5)
WBC: 4.9 10*3/uL (ref 4.0–10.5)
nRBC: 0 % (ref 0.0–0.2)

## 2023-12-27 LAB — COMPREHENSIVE METABOLIC PANEL WITH GFR
ALT: 24 U/L (ref 0–44)
AST: 25 U/L (ref 15–41)
Albumin: 4.3 g/dL (ref 3.5–5.0)
Alkaline Phosphatase: 60 U/L (ref 38–126)
Anion gap: 9 (ref 5–15)
BUN: 12 mg/dL (ref 8–23)
CO2: 29 mmol/L (ref 22–32)
Calcium: 9.4 mg/dL (ref 8.9–10.3)
Chloride: 100 mmol/L (ref 98–111)
Creatinine, Ser: 0.89 mg/dL (ref 0.44–1.00)
GFR, Estimated: >60 mL/min (ref >60)
Glucose, Bld: 163 mg/dL — ABNORMAL HIGH (ref 70–99)
Potassium: 3.6 mmol/L (ref 3.5–5.1)
Sodium: 138 mmol/L (ref 135–145)
Total Bilirubin: 0.6 mg/dL (ref 0.0–1.2)
Total Protein: 7.8 g/dL (ref 6.5–8.1)

## 2023-12-27 LAB — GLUCOSE, CAPILLARY: Glucose-Capillary: 74 mg/dL (ref 70–99)

## 2023-12-27 MED ORDER — SODIUM CHLORIDE 0.9 % IV SOLN
8.0000 mg | Freq: Once | INTRAVENOUS | Status: AC
  Administered 2023-12-27: 8 mg via INTRAVENOUS
  Filled 2023-12-27: qty 4

## 2023-12-27 MED ORDER — AMINO ACID RADIOPROTECTANT - L-LYSINE 2.5%/L-ARGININE 2.5% IN NS
250.0000 mL/h | INTRAVENOUS | Status: AC
  Administered 2023-12-27: 250 mL/h via INTRAVENOUS
  Filled 2023-12-27: qty 1000

## 2023-12-27 MED ORDER — LUTETIUM LU 177 DOTATATE 370 MBQ/ML IV SOLN
200.0000 | Freq: Once | INTRAVENOUS | Status: AC
  Administered 2023-12-27: 202 via INTRAVENOUS

## 2023-12-27 MED ORDER — OCTREOTIDE ACETATE 30 MG IM KIT: 30.0000 mg | PACK | Freq: Once | INTRAMUSCULAR | Status: DC

## 2023-12-27 MED ORDER — SODIUM CHLORIDE 0.9 % IV SOLN
500.0000 mL | Freq: Once | INTRAVENOUS | Status: DC
Start: 2023-12-27 — End: 2024-01-02

## 2023-12-27 MED ORDER — ONDANSETRON HCL 8 MG PO TABS
8.0000 mg | ORAL_TABLET | Freq: Two times a day (BID) | ORAL | 0 refills | Status: DC | PRN
Start: 2023-12-27 — End: 2024-02-21

## 2023-12-27 MED ORDER — OCTREOTIDE ACETATE 500 MCG/ML IJ SOLN: 500.0000 ug | Freq: Once | INTRAMUSCULAR | Status: DC | PRN

## 2023-12-27 MED ORDER — PROCHLORPERAZINE EDISYLATE 10 MG/2ML IJ SOLN: 10.0000 mg | Freq: Four times a day (QID) | INTRAMUSCULAR | Status: DC | PRN

## 2023-12-27 NOTE — Progress Notes (Signed)
 Pt felt weak after treatment.  VS WNL.  Checked CBG  74.  Pt drank ginger ale and felt better after sitting up for a little while

## 2023-12-27 NOTE — Progress Notes (Signed)
 CLINICAL DATA: [[age]] year-old [female] with metastatic neuroendocrine tumor. Well differentiated tumor with somatostatin receptor is identified within the [liver and associated small bowel lesions] by DOTATATE PET CT scan.    EXAM: NUCLEAR MEDICINE LUTATHERA ADMINISTRATION  TECHNIQUE: Infusion: The nuclear medicine technologist and I personally verified the dose activity ([202] mCi) to be delivered as specified in the written directive (200 mCi), and verified the patient identification via 2 separate methods.  20 gauge IV were started in the antecubital veins. Anti-emetics were administered by nursing staff. Amino acid renal protection was initiated 30 minutes prior to Lu 177 DOTATATE (Lutathera) infusion and continued continuously for 4 hours. Lutathera infusion was administered over 30 minutes.      The total administered dose was [202] mCi Lu 177 DOTATATE.    The entire IV tubing, venocatheter, stopcock and syringes was removed in total, placed in a disposal bag and sent for assay of the residual activity, which will be reported at a later time in our EMR by the physics staff. Pressure was applied to the venipuncture sites, and a compression bandage placed. Radiation Safety personnel were present to perform the discharge survey, as detailed on their documentation.      RADIOPHARMACEUTICALS:   [Two hundred two] mCi Lu 177 DOTATATE   FINDINGS: Diagnosis: [Metastatic neuroendocrine tumor.]    Current Infusion: [1]    Planned Infusions: [4]    Patient and patient's friend were again informed of the risks and benefits procedure.  All questions were answered.  Consent forms signed.  The patient's most recent blood counts were reviewed and remains a good candidate to proceed with Lutathera. The patient was situated in an infusion suite and administered Lutathera as above. Patient will follow-up with referring oncologist for interval serum laboratories (CBC and CMP) in  approximately 4 weeks.       Sandostatin injection deferred as patient received Sandostatin on 12/08/2023.     IMPRESSION: [First]  Lu 177 DOTATATE treatment for metastatic neuroendocrine tumor. The patient tolerated the infusion well. The patient will return in 8 weeks for ongoing care.

## 2023-12-29 ENCOUNTER — Other Ambulatory Visit: Payer: Self-pay

## 2023-12-29 ENCOUNTER — Emergency Department (HOSPITAL_COMMUNITY)

## 2023-12-29 ENCOUNTER — Encounter (HOSPITAL_COMMUNITY): Payer: Self-pay | Admitting: Emergency Medicine

## 2023-12-29 ENCOUNTER — Inpatient Hospital Stay (HOSPITAL_COMMUNITY)
Admission: EM | Admit: 2023-12-29 | Discharge: 2023-12-31 | DRG: 281 | Disposition: A | Discharge status: Home / Self Care | Attending: Family Medicine | Admitting: Family Medicine

## 2023-12-29 DIAGNOSIS — F329 Major depressive disorder, single episode, unspecified: Secondary | ICD-10-CM | POA: Diagnosis present

## 2023-12-29 DIAGNOSIS — Z803 Family history of malignant neoplasm of breast: Secondary | ICD-10-CM

## 2023-12-29 DIAGNOSIS — I252 Old myocardial infarction: Secondary | ICD-10-CM

## 2023-12-29 DIAGNOSIS — C7A8 Other malignant neuroendocrine tumors: Secondary | ICD-10-CM | POA: Diagnosis present

## 2023-12-29 DIAGNOSIS — Z823 Family history of stroke: Secondary | ICD-10-CM

## 2023-12-29 DIAGNOSIS — G4733 Obstructive sleep apnea (adult) (pediatric): Secondary | ICD-10-CM | POA: Diagnosis present

## 2023-12-29 DIAGNOSIS — Z8249 Family history of ischemic heart disease and other diseases of the circulatory system: Secondary | ICD-10-CM | POA: Diagnosis not present

## 2023-12-29 DIAGNOSIS — R079 Chest pain, unspecified: Secondary | ICD-10-CM | POA: Diagnosis present

## 2023-12-29 DIAGNOSIS — I2510 Atherosclerotic heart disease of native coronary artery without angina pectoris: Secondary | ICD-10-CM | POA: Diagnosis present

## 2023-12-29 DIAGNOSIS — Z882 Allergy status to sulfonamides status: Secondary | ICD-10-CM | POA: Diagnosis not present

## 2023-12-29 DIAGNOSIS — C7B8 Other secondary neuroendocrine tumors: Secondary | ICD-10-CM | POA: Diagnosis present

## 2023-12-29 DIAGNOSIS — I214 Non-ST elevation (NSTEMI) myocardial infarction: Secondary | ICD-10-CM | POA: Diagnosis present

## 2023-12-29 DIAGNOSIS — E66811 Obesity, class 1: Secondary | ICD-10-CM | POA: Diagnosis present

## 2023-12-29 DIAGNOSIS — Z808 Family history of malignant neoplasm of other organs or systems: Secondary | ICD-10-CM

## 2023-12-29 DIAGNOSIS — Z6831 Body mass index (BMI) 31.0-31.9, adult: Secondary | ICD-10-CM

## 2023-12-29 DIAGNOSIS — Z79899 Other long term (current) drug therapy: Secondary | ICD-10-CM

## 2023-12-29 DIAGNOSIS — Z90710 Acquired absence of both cervix and uterus: Secondary | ICD-10-CM

## 2023-12-29 DIAGNOSIS — Z881 Allergy status to other antibiotic agents status: Secondary | ICD-10-CM

## 2023-12-29 DIAGNOSIS — E669 Obesity, unspecified: Secondary | ICD-10-CM | POA: Diagnosis present

## 2023-12-29 DIAGNOSIS — I249 Acute ischemic heart disease, unspecified: Principal | ICD-10-CM

## 2023-12-29 DIAGNOSIS — E1165 Type 2 diabetes mellitus with hyperglycemia: Secondary | ICD-10-CM | POA: Diagnosis present

## 2023-12-29 DIAGNOSIS — Z7984 Long term (current) use of oral hypoglycemic drugs: Secondary | ICD-10-CM | POA: Diagnosis not present

## 2023-12-29 DIAGNOSIS — Z531 Procedure and treatment not carried out because of patient's decision for reasons of belief and group pressure: Secondary | ICD-10-CM | POA: Diagnosis present

## 2023-12-29 DIAGNOSIS — K219 Gastro-esophageal reflux disease without esophagitis: Secondary | ICD-10-CM | POA: Diagnosis present

## 2023-12-29 DIAGNOSIS — I2489 Other forms of acute ischemic heart disease: Secondary | ICD-10-CM | POA: Diagnosis not present

## 2023-12-29 DIAGNOSIS — I10 Essential (primary) hypertension: Secondary | ICD-10-CM | POA: Diagnosis present

## 2023-12-29 LAB — URINALYSIS, W/ REFLEX TO CULTURE (INFECTION SUSPECTED)
Bacteria, UA: NONE SEEN
Bilirubin Urine: NEGATIVE
Glucose, UA: 50 mg/dL — AB
Hgb urine dipstick: NEGATIVE
Ketones, ur: NEGATIVE mg/dL
Nitrite: NEGATIVE
Protein, ur: NEGATIVE mg/dL
Specific Gravity, Urine: 1.012 (ref 1.005–1.030)
pH: 5 (ref 5.0–8.0)

## 2023-12-29 LAB — I-STAT CHEM 8, ED
BUN: 7 mg/dL — ABNORMAL LOW (ref 8–23)
Calcium, Ion: 1.04 mmol/L — ABNORMAL LOW (ref 1.15–1.40)
Chloride: 108 mmol/L (ref 98–111)
Creatinine, Ser: 0.8 mg/dL (ref 0.44–1.00)
Glucose, Bld: 150 mg/dL — ABNORMAL HIGH (ref 70–99)
HCT: 41 % (ref 36.0–46.0)
Hemoglobin: 13.9 g/dL (ref 12.0–15.0)
Potassium: 3.5 mmol/L (ref 3.5–5.1)
Sodium: 140 mmol/L (ref 135–145)
TCO2: 22 mmol/L (ref 22–32)

## 2023-12-29 LAB — COMPREHENSIVE METABOLIC PANEL WITH GFR
ALT: 25 U/L (ref 0–44)
AST: 23 U/L (ref 15–41)
Albumin: 3.8 g/dL (ref 3.5–5.0)
Alkaline Phosphatase: 52 U/L (ref 38–126)
Anion gap: 7 (ref 5–15)
BUN: 10 mg/dL (ref 8–23)
CO2: 27 mmol/L (ref 22–32)
Calcium: 8.9 mg/dL (ref 8.9–10.3)
Chloride: 103 mmol/L (ref 98–111)
Creatinine, Ser: 0.85 mg/dL (ref 0.44–1.00)
GFR, Estimated: >60 mL/min (ref >60)
Glucose, Bld: 173 mg/dL — ABNORMAL HIGH (ref 70–99)
Potassium: 3.5 mmol/L (ref 3.5–5.1)
Sodium: 137 mmol/L (ref 135–145)
Total Bilirubin: 0.7 mg/dL (ref 0.0–1.2)
Total Protein: 6.9 g/dL (ref 6.5–8.1)

## 2023-12-29 LAB — CBC WITH DIFFERENTIAL/PLATELET
Abs Immature Granulocytes: 0.02 10*3/uL (ref 0.00–0.07)
Basophils Absolute: 0 10*3/uL (ref 0.0–0.1)
Basophils Relative: 1 %
Eosinophils Absolute: 0.1 10*3/uL (ref 0.0–0.5)
Eosinophils Relative: 2 %
HCT: 39.7 % (ref 36.0–46.0)
Hemoglobin: 12.5 g/dL (ref 12.0–15.0)
Immature Granulocytes: 0 %
Lymphocytes Relative: 15 %
Lymphs Abs: 0.8 10*3/uL (ref 0.7–4.0)
MCH: 28.8 pg (ref 26.0–34.0)
MCHC: 31.5 g/dL (ref 30.0–36.0)
MCV: 91.5 fL (ref 80.0–100.0)
Monocytes Absolute: 0.4 10*3/uL (ref 0.1–1.0)
Monocytes Relative: 6 %
Neutro Abs: 4.3 10*3/uL (ref 1.7–7.7)
Neutrophils Relative %: 76 %
Platelets: 265 10*3/uL (ref 150–400)
RBC: 4.34 MIL/uL (ref 3.87–5.11)
RDW: 13 % (ref 11.5–15.5)
WBC: 5.6 10*3/uL (ref 4.0–10.5)
nRBC: 0 % (ref 0.0–0.2)

## 2023-12-29 LAB — LIPASE, BLOOD: Lipase: 21 U/L (ref 11–51)

## 2023-12-29 LAB — CBG MONITORING, ED: Glucose-Capillary: 159 mg/dL — ABNORMAL HIGH (ref 70–99)

## 2023-12-29 LAB — TROPONIN I (HIGH SENSITIVITY)
Troponin I (High Sensitivity): 18 ng/L — ABNORMAL HIGH (ref <18)
Troponin I (High Sensitivity): 730 ng/L (ref <18)
Troponin I (High Sensitivity): 1670 ng/L (ref <18)

## 2023-12-29 LAB — I-STAT CG4 LACTIC ACID, ED: Lactic Acid, Venous: 2.1 mmol/L (ref 0.5–1.9)

## 2023-12-29 LAB — GLUCOSE, CAPILLARY: Glucose-Capillary: 125 mg/dL — ABNORMAL HIGH (ref 70–99)

## 2023-12-29 LAB — MAGNESIUM: Magnesium: 2.1 mg/dL (ref 1.7–2.4)

## 2023-12-29 LAB — PHOSPHORUS: Phosphorus: 2.6 mg/dL (ref 2.5–4.6)

## 2023-12-29 MED ORDER — ASPIRIN 81 MG PO CHEW: 324.0000 mg | CHEWABLE_TABLET | ORAL | Status: AC

## 2023-12-29 MED ORDER — FOLIC ACID 1 MG PO TABS
1000.0000 ug | ORAL_TABLET | Freq: Every morning | ORAL | Status: DC
  Administered 2023-12-30 – 2023-12-31 (×2): 1 mg via ORAL
  Filled 2023-12-29 (×2): qty 1

## 2023-12-29 MED ORDER — GABAPENTIN 100 MG PO CAPS
100.0000 mg | ORAL_CAPSULE | Freq: Every day | ORAL | Status: DC
  Administered 2023-12-29 – 2023-12-30 (×2): 100 mg via ORAL
  Filled 2023-12-29 (×2): qty 1

## 2023-12-29 MED ORDER — HYDROCHLOROTHIAZIDE 12.5 MG PO TABS
12.5000 mg | ORAL_TABLET | Freq: Every day | ORAL | Status: DC
  Administered 2023-12-30 – 2023-12-31 (×2): 12.5 mg via ORAL
  Filled 2023-12-29 (×2): qty 1

## 2023-12-29 MED ORDER — ASPIRIN 81 MG PO TBEC
81.0000 mg | DELAYED_RELEASE_TABLET | Freq: Every day | ORAL | Status: DC
  Administered 2023-12-30 – 2023-12-31 (×2): 81 mg via ORAL
  Filled 2023-12-29 (×2): qty 1

## 2023-12-29 MED ORDER — NITROGLYCERIN 0.4 MG SL SUBL
0.4000 mg | SUBLINGUAL_TABLET | SUBLINGUAL | Status: DC | PRN
  Administered 2023-12-29: 0.4 mg via SUBLINGUAL
  Filled 2023-12-29: qty 1

## 2023-12-29 MED ORDER — ORAL CARE MOUTH RINSE: 15.0000 mL | OROMUCOSAL | Status: DC | PRN

## 2023-12-29 MED ORDER — LOSARTAN POTASSIUM 50 MG PO TABS
50.0000 mg | ORAL_TABLET | Freq: Every day | ORAL | Status: DC
  Administered 2023-12-30 – 2023-12-31 (×2): 50 mg via ORAL
  Filled 2023-12-29 (×2): qty 1

## 2023-12-29 MED ORDER — ONDANSETRON HCL 4 MG/2ML IJ SOLN
4.0000 mg | Freq: Four times a day (QID) | INTRAMUSCULAR | Status: DC | PRN
Start: 2023-12-29 — End: 2023-12-31

## 2023-12-29 MED ORDER — LOSARTAN POTASSIUM-HCTZ 50-12.5 MG PO TABS: 1.0000 | ORAL_TABLET | Freq: Every morning | ORAL | Status: DC

## 2023-12-29 MED ORDER — SODIUM CHLORIDE 0.9 % IV BOLUS
1000.0000 mL | Freq: Once | INTRAVENOUS | Status: AC
  Administered 2023-12-29: 1000 mL via INTRAVENOUS

## 2023-12-29 MED ORDER — HEPARIN BOLUS VIA INFUSION
4000.0000 [IU] | Freq: Once | INTRAVENOUS | Status: AC
  Administered 2023-12-29: 4000 [IU] via INTRAVENOUS
  Filled 2023-12-29: qty 4000

## 2023-12-29 MED ORDER — IOHEXOL 350 MG/ML SOLN
100.0000 mL | Freq: Once | INTRAVENOUS | Status: AC | PRN
  Administered 2023-12-29: 100 mL via INTRAVENOUS

## 2023-12-29 MED ORDER — VITAMIN B-12 1000 MCG PO TABS
1000.0000 ug | ORAL_TABLET | Freq: Every morning | ORAL | Status: DC
  Administered 2023-12-30 – 2023-12-31 (×2): 1000 ug via ORAL
  Filled 2023-12-29 (×2): qty 1

## 2023-12-29 MED ORDER — METFORMIN HCL ER 500 MG PO TB24: 500.0000 mg | ORAL_TABLET | Freq: Every day | ORAL | Status: DC

## 2023-12-29 MED ORDER — TRAMADOL HCL 50 MG PO TABS: 50.0000 mg | ORAL_TABLET | Freq: Two times a day (BID) | ORAL | Status: DC | PRN

## 2023-12-29 MED ORDER — ACETAMINOPHEN 650 MG RE SUPP: 650.0000 mg | Freq: Four times a day (QID) | RECTAL | Status: DC | PRN

## 2023-12-29 MED ORDER — ROSUVASTATIN CALCIUM 20 MG PO TABS
20.0000 mg | ORAL_TABLET | Freq: Every morning | ORAL | Status: DC
  Administered 2023-12-30 – 2023-12-31 (×2): 20 mg via ORAL
  Filled 2023-12-29 (×2): qty 1

## 2023-12-29 MED ORDER — HEPARIN (PORCINE) 25000 UT/250ML-% IV SOLN
850.0000 [IU]/h | INTRAVENOUS | Status: DC
  Administered 2023-12-29: 850 [IU]/h via INTRAVENOUS
  Filled 2023-12-29 (×2): qty 250

## 2023-12-29 MED ORDER — ACETAMINOPHEN 325 MG PO TABS: 650.0000 mg | ORAL_TABLET | ORAL | Status: DC | PRN

## 2023-12-29 MED ORDER — DULOXETINE HCL 60 MG PO CPEP
60.0000 mg | ORAL_CAPSULE | Freq: Every morning | ORAL | Status: DC
  Administered 2023-12-30 – 2023-12-31 (×2): 60 mg via ORAL
  Filled 2023-12-29 (×2): qty 1

## 2023-12-29 MED ORDER — ACETAMINOPHEN 325 MG PO TABS
650.0000 mg | ORAL_TABLET | Freq: Four times a day (QID) | ORAL | Status: DC | PRN
  Administered 2023-12-30 (×2): 650 mg via ORAL
  Filled 2023-12-29 (×2): qty 2

## 2023-12-29 MED ORDER — POTASSIUM CHLORIDE CRYS ER 20 MEQ PO TBCR
40.0000 meq | EXTENDED_RELEASE_TABLET | Freq: Once | ORAL | Status: AC
  Administered 2023-12-29: 40 meq via ORAL
  Filled 2023-12-29: qty 2

## 2023-12-29 MED ORDER — ASPIRIN 300 MG RE SUPP: 300.0000 mg | RECTAL | Status: AC

## 2023-12-29 MED ORDER — INSULIN ASPART 100 UNIT/ML IJ SOLN
0.0000 [IU] | Freq: Three times a day (TID) | INTRAMUSCULAR | Status: DC
Start: 2023-12-30 — End: 2023-12-31
  Administered 2023-12-30: 2 [IU] via SUBCUTANEOUS
  Filled 2023-12-29: qty 0.15

## 2023-12-29 MED ORDER — PANTOPRAZOLE SODIUM 40 MG PO TBEC
40.0000 mg | DELAYED_RELEASE_TABLET | Freq: Every morning | ORAL | Status: DC
Start: 2023-12-30 — End: 2023-12-31
  Administered 2023-12-30 – 2023-12-31 (×2): 40 mg via ORAL
  Filled 2023-12-29 (×2): qty 1

## 2023-12-29 MED ORDER — CYCLOBENZAPRINE HCL 10 MG PO TABS: 5.0000 mg | ORAL_TABLET | Freq: Three times a day (TID) | ORAL | Status: DC | PRN

## 2023-12-29 NOTE — ED Notes (Signed)
 Phlebotomy called to stick pt for second troponin due to multiple sticks

## 2023-12-29 NOTE — ED Notes (Signed)
 [facility] called to set up transport

## 2023-12-29 NOTE — H&P (Signed)
 History and Physical    Patient: Tanya Walsh DOB: 1956-02-01 DOA: 12/29/2023 DOS: the patient was seen and examined on 12/29/2023 PCP: Macy Mis, MD  Patient coming from: Home  Chief Complaint:  Chief Complaint  Patient presents with   Abdominal Pain   Chest Pain   HPI: Tanya Walsh is a 68 y.o. 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 She has been having diarrhea. The patient stated that she took sublingual nitroglycerin and aspirin which provided some relief. No abdominal pain, nausea, emesis,  constipation, melena or hematochezia.denied fever, chills, rhinorrhea, sore throat, wheezing or hemoptysis. No chest pain, palpitations, diaphoresis, PND, orthopnea or pitting edema of the lower extremities. No flank pain, dysuria, frequency or hematuria.  No polyuria, polydipsia, polyphagia or blurred vision.   Lab work: Urinalysis showed glucose of 50 mg/dL and trace leukocyte esterase.  Unremarkable CBC.  Lactic acid was 2.1 mmol/L.  Lipase was normal.  CMP with a glucose of 173 mg/dL, but otherwise normal.  Troponin was 18 then 730 ng/L.  Imaging: Portable 1 view chest radiograph with no acute cardiopulmonary abnormality.  CTA chest no PE no inflammatory process, mass consolidation, pleural effusion or pneumothorax.  Redemonstration of ill-defined hypodensity lesion in the right hepatic lobe.  Previously seen mesenteric and retroperitoneal nodules.  Aortic atherosclerosis.  ED course: Initial vital signs were temperature 97.7 F, pulse 54, respiration 14, BP 176/90 mmHg O2 sat 100% on room air.  The patient was started on heparin infusion after 4000  units bolus.  She also received 1000 mL of normal saline bolus.   Review of Systems: As mentioned in the history of present illness. All other systems reviewed and are negative.  Past Medical History:  Diagnosis Date   Coronary artery disease    Depression    GI bleed 07/17/2015   Hypertension    Myocardial infarction [facility])    Nocturia 08/14/2019   Refusal of blood transfusions as patient is Jehovah's Witness    Past Surgical History:  Procedure Laterality Date   ABDOMINAL HYSTERECTOMY     BACK SURGERY     BIOPSY  12/10/2020   Procedure: BIOPSY;  Surgeon: Kerin Salen, MD;  Location: [facility] ENDOSCOPY;  Service: Gastroenterology;;   BIOPSY  12/11/2020   Procedure: BIOPSY;  Surgeon: Kerin Salen, MD;  Location: [facility] ENDOSCOPY;  Service: Gastroenterology;;   COLONOSCOPY WITH PROPOFOL N/A 12/11/2020   Procedure: COLONOSCOPY WITH PROPOFOL;  Surgeon: Kerin Salen, MD;  Location: [facility] ENDOSCOPY;  Service: Gastroenterology;  Laterality: N/A;   ESOPHAGOGASTRODUODENOSCOPY N/A 07/17/2015   Procedure: ESOPHAGOGASTRODUODENOSCOPY (EGD);  Surgeon: Graylin Shiver, MD;  Location: [facility] ENDOSCOPY;  Service: Endoscopy;  Laterality: N/A;   ESOPHAGOGASTRODUODENOSCOPY (EGD) WITH PROPOFOL N/A 12/10/2020   Procedure: ESOPHAGOGASTRODUODENOSCOPY (EGD) WITH PROPOFOL;  Surgeon: Kerin Salen, MD;  Location: [facility] ENDOSCOPY;  Service: Gastroenterology;  Laterality: N/A;   HEMOSTASIS CLIP PLACEMENT  12/10/2020   Procedure: HEMOSTASIS CLIP PLACEMENT;  Surgeon: Kerin Salen, MD;  Location: [facility] ENDOSCOPY;  Service: Gastroenterology;;   Social History:  reports that she has never smoked. She has never used smokeless tobacco. She reports current alcohol use. She reports that she does not use drugs.  Allergies  Allergen Reactions  Bactrim [Sulfamethoxazole-Trimethoprim] Swelling    Family History  Problem Relation Age of Onset   Stroke Mother    Cancer Mother        brain   Heart disease Father    Heart disease Brother    Breast  cancer Niece     Prior to Admission medications   Medication Sig Start Date End Date Taking? Authorizing Provider  cyclobenzaprine (FLEXERIL) 5 MG tablet Take 1 tablet (5 mg total) by mouth 3 (three) times daily as needed for muscle spasms. 12/11/20   Erick Blinks, MD  DULoxetine (CYMBALTA) 60 MG capsule Take 60 mg by mouth daily.    [provider]  fluticasone (FLONASE) 50 MCG/ACT nasal spray Place 1 spray into both nostrils daily. 11/11/21   [provider]  gabapentin (NEURONTIN) 100 MG capsule Take 1 capsule (100 mg total) by mouth at bedtime. 12/21/23   Drema [place], DO  losartan-hydrochlorothiazide (HYZAAR) 50-12.5 MG tablet Take 1 tablet by mouth daily. 03/21/18   [provider]  metFORMIN (GLUCOPHAGE-XR) 500 MG 24 hr tablet Take 500 mg by mouth daily with breakfast. 09/07/22   [provider]  Multiple Vitamins-Minerals (MULTIVITAMIN ADULTS 50+) TABS Take 1 tablet by mouth daily.    [provider]  nitroGLYCERIN (NITROSTAT) 0.4 MG SL tablet Place 1 tablet (0.4 mg total) under the tongue every 5 (five) minutes as needed for chest pain. Patient not taking: Reported on 12/21/2023 03/29/18   Dione Booze, MD  ondansetron (ZOFRAN) 8 MG tablet Take 1 tablet (8 mg total) by mouth 2 (two) times daily as needed for nausea or vomiting. 12/27/23   Lorna Few, MD  pantoprazole (PROTONIX) 40 MG tablet Take 40 mg by mouth daily.    [provider]  rosuvastatin (CRESTOR) 20 MG tablet Take 20 mg by mouth daily.    [provider]  traMADol (ULTRAM) 50 MG tablet Take 1 tablet (50 mg total) by mouth every 12 (twelve) hours as needed. 11/15/23   Malachy Mood, MD  trolamine salicylate (ASPERCREME) 10 % cream Apply 1 application topically as needed for muscle pain.    [provider]    Physical Exam: Vitals:   12/29/23 1500 12/29/23 1600 12/29/23 1640 12/29/23 1700  BP: (!) 147/80 (!) 115/91 (!) 139/94 139/88  Pulse: 61 65 79 61  Resp: 17  18 12 14   Temp:      TempSrc:      SpO2: 99% 98% 100% 100%   Physical Exam Vitals and nursing note reviewed.  Constitutional:      General: She is awake. She is not in acute distress.    Appearance: She is well-developed. She is obese. She is ill-appearing.  HENT:     Head: Normocephalic.     Nose: No rhinorrhea.     Mouth/Throat:     Mouth: Mucous membranes are moist.  Eyes:     General: No scleral icterus.    Pupils: Pupils are equal, round, and reactive to light.  Neck:     Vascular: No JVD.  Cardiovascular:     Rate and Rhythm: Normal rate and regular rhythm.     Heart sounds: S1 normal and S2 normal.  Pulmonary:     Effort: Pulmonary effort is normal.     Breath sounds: Normal breath sounds. No wheezing, rhonchi or rales.  Abdominal:     General: Bowel sounds are normal.     Palpations: Abdomen is soft.     Tenderness: There is no  abdominal tenderness.  Musculoskeletal:     Cervical back: Neck supple.     Right lower leg: No edema.     Left lower leg: No edema.  Skin:    General: Skin is warm and dry.  Neurological:     General: No focal deficit present.     Mental Status: She is alert and oriented to person, place, and time.  Psychiatric:        Mood and Affect: Mood normal.        Behavior: Behavior normal. Behavior is cooperative.     Data Reviewed:  Results are pending, will review when available.  EKG: Vent. rate 53 BPM PR interval 166 ms QRS duration 89 ms QT/QTcB 428/402 ms P-R-T axes 52 41 52 Sinus rhythm  Assessment and Plan: Principal Problem:   CAD (coronary artery disease) Presenting with:   ACS (acute coronary syndrome) (HCC) IP/PCU. Supplemental oxygen as needed. Continue heparin infusion. Continue daily aspirin. Sublingual nitroglycerin as needed. Morphine as needed for pain. On rosuvastatin. Trend troponin level. Serial EKG. Obtain echocardiogram. Cardiology has been consulted.  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    Essential hypertension Continue losartan 50 mg p.o. daily. Continue hydrochlorothiazide 12.5 mg p.o. daily.    GERD (gastroesophageal reflux disease) Continue pantoprazole 40 mg p.o. every morning.    MDD (major depressive disorder) On duloxetine 60 mg p.o. in the morning.    OSA (obstructive sleep apnea) CPAP at bedtime.    Type 2 diabetes mellitus with hyperglycemia,  without long-term current use of insulin (HCC) Carbohydrate modified diet. CBG monitoring with RI SS. Check hemoglobin A1c.     Advance Care Planning:   Code Status: Full Code   Consults:   Family Communication:   Severity of Illness: The appropriate patient status for this patient is INPATIENT. Inpatient status is judged to be reasonable and necessary in order to provide the required intensity of service to ensure the patient's safety. The patient's presenting symptoms, physical exam findings, and initial radiographic and laboratory data in the context of their chronic comorbidities is felt to place them at high risk for further clinical deterioration. Furthermore, it is not anticipated that the patient will be medically stable for discharge from the hospital within 2 midnights of admission.   * I certify that at the point of admission it is my clinical judgment that the patient will require inpatient hospital care spanning beyond 2 midnights from the point of admission due to high intensity of service, high risk for further deterioration and high frequency of surveillance required.*  Author: Danice Dural, MD 12/29/2023 5:59 PM  For on call review [email or web address].   This document was prepared using Dragon voice recognition software and may contain some unintended transcription errors.

## 2023-12-29 NOTE — ED Notes (Signed)
 IV team consulted again for additional IV

## 2023-12-29 NOTE — ED Provider Notes (Signed)
 Patient signed out to me by Dr. Morris Arch pending results of labs.  Patient's initial troponin elevated at 18.  Repeat was 730.  There was a long delay in getting this due to patient being a difficult IV stick.  Patient remains pain-free at this time.  Will recheck EKG.  Will also order heparin per pharmacy protocol.  Will consult cardiology and likely admit to the medicine service   Lind Repine, MD 12/29/23 1450

## 2023-12-29 NOTE — ED Provider Notes (Signed)
 [place] EMERGENCY DEPARTMENT AT [facility] Provider Note  CSN: [id number] Arrival date & time: 12/29/23 0541  Chief Complaint(s) Abdominal Pain and Chest Pain  HPI Tanya Walsh is a 68 y.o. female with a past medical history listed below including malignant neuroendocrine tumor of the small intestine metastasized to liver despite chemotherapy who started Lutathera radiation therapy yesterday presents for several hours of abdominal pain associated with diarrhea.  Patient was having a bowel movement that was normally formed that progressed to diarrhea.  It was nonbloody.  Patient reported feeling lightheaded, clammy and generally weak during the episode.  She did not develop substernal chest pressure that radiated into the left arm.  Pain improved with nitroglycerin.  She was also given aspirin.   She did have associated shortness of breath.  Patient reports a remote history of MI not requiring stenting or bypass. Patient has had several CT coronary with no significant blockage.  Last of which was in 2021.  The history is provided by the patient.    Past Medical History Past Medical History:  Diagnosis Date   Coronary artery disease    Depression    GI bleed 07/17/2015   Hypertension    Myocardial infarction [facility])    Nocturia 08/14/2019   Refusal of blood transfusions as patient is Jehovah's Witness    Patient Active Problem List   Diagnosis Date Noted   Primary malignant neuroendocrine tumor of small intestine (HCC) 02/26/2021   GIB (gastrointestinal bleeding) 12/09/2020   Nocturia 08/14/2019   Melena 07/17/2015   Incidental lung nodule, less than or equal to 3mm 07/17/2015   Chest pain at rest 04/16/2014   Essential hypertension 04/16/2014   Morbid obesity (HCC) 04/16/2014   Family history of early CAD 04/16/2014   Home Medication(s) Prior to Admission medications   Medication Sig Start Date End Date Taking? Authorizing Provider  cyclobenzaprine  (FLEXERIL) 5 MG tablet Take 1 tablet (5 mg total) by mouth 3 (three) times daily as needed for muscle spasms. 12/11/20   Erick Blinks, MD  DULoxetine (CYMBALTA) 60 MG capsule Take 60 mg by mouth daily.    [provider]  fluticasone (FLONASE) 50 MCG/ACT nasal spray Place 1 spray into both nostrils daily. 11/11/21   [provider]  gabapentin (NEURONTIN) 100 MG capsule Take 1 capsule (100 mg total) by mouth at bedtime. 12/21/23   Drema [place], DO  losartan-hydrochlorothiazide (HYZAAR) 50-12.5 MG tablet Take 1 tablet by mouth daily. 03/21/18   [provider]  metFORMIN (GLUCOPHAGE-XR) 500 MG 24 hr tablet Take 500 mg by mouth daily with breakfast. 09/07/22   [provider]  Multiple Vitamins-Minerals (MULTIVITAMIN ADULTS 50+) TABS Take 1 tablet by mouth daily.    [provider]  nitroGLYCERIN (NITROSTAT) 0.4 MG SL tablet Place 1 tablet (0.4 mg total) under the tongue every 5 (five) minutes as needed for chest pain. Patient not taking: Reported on 12/21/2023 03/29/18   Dione Booze, MD  ondansetron (ZOFRAN) 8 MG tablet Take 1 tablet (8 mg total) by mouth 2 (two) times daily as needed for nausea or vomiting. 12/27/23   Lorna Few, MD  pantoprazole (PROTONIX) 40 MG tablet Take 40 mg by mouth daily.    [provider]  rosuvastatin (CRESTOR) 20 MG tablet Take 20 mg by mouth daily.    [provider]  traMADol (ULTRAM) 50 MG tablet Take 1 tablet (50 mg total) by mouth every 12 (twelve) hours as needed. 11/15/23   Malachy Mood,  MD  trolamine salicylate (ASPERCREME) 10 % cream Apply 1 application topically as needed for muscle pain.    [provider]                                                                                                                                    Allergies Bactrim [sulfamethoxazole-trimethoprim]  Review of Systems Review of Systems As noted in HPI  Physical Exam Vital Signs  I have reviewed the triage  vital signs BP (!) 160/91   Pulse (!) 49   Temp 97.7 F (36.5 C) (Oral)   Resp (!) 23   SpO2 100%   Physical Exam Vitals reviewed.  Constitutional:      General: She is not in acute distress.    Appearance: She is well-developed. She is not diaphoretic.  HENT:     Head: Normocephalic and atraumatic.     Nose: Nose normal.  Eyes:     General: No scleral icterus.       Right eye: No discharge.        Left eye: No discharge.     Conjunctiva/sclera: Conjunctivae normal.     Pupils: Pupils are equal, round, and reactive to light.  Cardiovascular:     Rate and Rhythm: Normal rate and regular rhythm.     Heart sounds: No murmur heard.    No friction rub. No gallop.  Pulmonary:     Effort: Pulmonary effort is normal. No respiratory distress.     Breath sounds: Normal breath sounds. No stridor. No rales.  Abdominal:     General: There is no distension.     Palpations: Abdomen is soft.     Tenderness: There is abdominal tenderness in the suprapubic area.  Musculoskeletal:        General: No tenderness.     Cervical back: Normal range of motion and neck supple.  Skin:    General: Skin is warm and dry.     Findings: No erythema or rash.  Neurological:     Mental Status: She is alert and oriented to person, place, and time.     ED Results and Treatments Labs (all labs ordered are listed, but only abnormal results are displayed) Labs Reviewed  COMPREHENSIVE METABOLIC PANEL WITH GFR - Abnormal; Notable for the following components:      Result Value   Glucose, Bld 173 (*)    All other components within normal limits  URINALYSIS, W/ REFLEX TO CULTURE (INFECTION SUSPECTED) - Abnormal; Notable for the following components:   Glucose, UA 50 (*)    Leukocytes,Ua TRACE (*)    All other components within normal limits  CBG MONITORING, ED - Abnormal; Notable for the following components:   Glucose-Capillary 159 (*)    All other components within normal limits  I-STAT CHEM 8, ED -  Abnormal; Notable for the following components:   BUN 7 (*)    Glucose, Bld  150 (*)    Calcium, Ion 1.04 (*)    All other components within normal limits  I-STAT CG4 LACTIC ACID, ED - Abnormal; Notable for the following components:   Lactic Acid, Venous 2.1 (*)    All other components within normal limits  TROPONIN I (HIGH SENSITIVITY) - Abnormal; Notable for the following components:   Troponin I (High Sensitivity) 18 (*)    All other components within normal limits  LIPASE, BLOOD  CBC WITH DIFFERENTIAL/PLATELET                                                                                                                         EKG   Radiology DG Chest Portable 1 View Result Date: 12/29/2023 CLINICAL DATA:  68 year old female with chest and abdominal pain. Metastatic neuroendocrine tumor. EXAM: PORTABLE CHEST 1 VIEW COMPARISON:  Restaging CT Chest, Abdomen, and Pelvis 08/25/2023. Chest radiographs 04/18/2023. FINDINGS: Portable AP semi upright view at 0657 hours. Stable tortuosity of the thoracic aorta with otherwise normal heart and mediastinal contours. Visualized tracheal air column is within normal limits. Lung volumes are stable and within normal limits. Allowing for portable technique the lungs are clear. No pneumothorax or pleural effusion. Stable visualized osseous structures. Nonobstructed visible bowel gas pattern. IMPRESSION: No acute cardiopulmonary abnormality. Electronically Signed   By: Odessa Fleming M.D.   On: 12/29/2023 07:04    Medications Ordered in ED Medications  sodium chloride 0.9 % bolus 1,000 mL (1,000 mLs Intravenous New Bag/Given 12/29/23 1610)   Procedures Procedures  (including critical care time) Medical Decision Making / ED Course   Medical Decision Making Amount and/or Complexity of Data Reviewed Labs: ordered. Decision-making details documented in ED Course. Radiology: ordered and independent interpretation performed. Decision-making details documented  in ED Course. ECG/medicine tests: ordered and independent interpretation performed. Decision-making details documented in ED Course.    Differential diagnosis and workup considered.  Abdominal pain Likely related to recent radiation therapy.  Will assess for any acute complication.  Will require CT abdomen. CBC without leukocytosis or anemia.  Metabolic panel without significant electrolyte derangements or renal insufficiency.  No evidence of bili obstruction or pancreatitis.  Chest pain With typical features.  EKG without acute ischemic changes or evidence of pericarditis. Will need ACS rule out.  Will also need to consider PE given her history of cancer.  Patient care turned over to oncoming provider. Patient case and results discussed in detail; please see their note for further ED managment.       Final Clinical Impression(s) / ED Diagnoses Final diagnoses:  None    This chart was dictated using voice recognition software.  Despite best efforts to proofread,  errors can occur which can change the documentation meaning.    Nira Conn, MD 12/29/23 [phone]

## 2023-12-29 NOTE — ED Notes (Addendum)
 Pt states relief in chest pressure after nitroglycerin administration. Pain went from a 7/10 to 2/10.  Dr. Bonita Bussing notified

## 2023-12-29 NOTE — ED Notes (Signed)
Pt ambulatory to bathroom with standby assistance 

## 2023-12-29 NOTE — ED Notes (Addendum)
 IV team at bedside, IV team did not start the IV due to pt having an IV already. IV team notified that additional IV is needed for second troponin. Pt has been stuck 6 times by other staff in the ER. Lab called to see if they could stick pt due to complications.

## 2023-12-29 NOTE — ED Notes (Addendum)
 Second troponin sent, lab called to notify that blood hemolyzed.

## 2023-12-29 NOTE — ED Triage Notes (Signed)
 Pt BIBA from home c/o abd pain and chest pain from radiation site. Hx stage 4 small intestine & liver cancer. Pt received first radiation tx on 12/27/23. Took SL nitroglycerin and 325mg  aspirin at home PTA, pain decreased from 7/10 to 5/10. Endorses diarrhea. Denies fever, N/V, dizziness.

## 2023-12-29 NOTE — Progress Notes (Signed)
 PHARMACY - ANTICOAGULATION CONSULT NOTE  Pharmacy Consult for heparin  Indication: chest pain/ACS  Allergies  Allergen Reactions   Bactrim [Sulfamethoxazole-Trimethoprim] Swelling    Patient Measurements:  HDW  = 71.2 Kg TBW 84.5 kg  Vital Signs: Temp: 97.9 F (36.6 C) (04/17 1438) Temp Source: Oral (04/17 0924) BP: 144/73 (04/17 1400) Pulse Rate: 61 (04/17 1400)  Labs: Recent Labs    12/27/23 0848 12/29/23 0648 12/29/23 0748 12/29/23 1255  HGB 14.2 12.5 13.9  --   HCT 44.4 39.7 41.0  --   PLT 292 265  --   --   CREATININE 0.89 0.85 0.80  --   TROPONINIHS  --  18*  --  730*    Estimated Creatinine Clearance: 67.4 mL/min (by C-G formula based on SCr of 0.8 mg/dL).   Medical History: Past Medical History:  Diagnosis Date   Coronary artery disease    Depression    GI bleed 07/17/2015   Hypertension    Myocardial infarction [facility])    Nocturia 08/14/2019   Refusal of blood transfusions as patient is Jehovah's Witness     Medications:  No anticoagulants PTA  Assessment: 68 yo F in ED with c/o abd pain and chest pain from radiation site.  Pharmacy consulted to dose heparin for elevated troponin.  No anticoagulants PTA. CBC WNL.  SCr WNL Trop 18>730  Goal of Therapy:  Heparin level 0.3-0.7 units/ml Monitor platelets by anticoagulation protocol: Yes   Plan:  Give 4000 units bolus x 1 Start heparin infusion at 850 units/hr Check anti-Xa level in 8 hours and daily while on heparin Continue to monitor H&H and platelets   Rubie Corona, Pharm.D Use secure chat for questions 12/29/2023 3:04 PM

## 2023-12-29 NOTE — ED Notes (Signed)
 Pt c/o acid reflux and chest pressure

## 2023-12-29 NOTE — ED Notes (Signed)
 Report called to [place] RN on [place].

## 2023-12-30 ENCOUNTER — Other Ambulatory Visit (HOSPITAL_COMMUNITY): Payer: Self-pay

## 2023-12-30 ENCOUNTER — Encounter (HOSPITAL_COMMUNITY)
Admission: EM | Disposition: A | Discharge status: Home / Self Care | Payer: Self-pay | Source: Home / Self Care | Attending: Family Medicine

## 2023-12-30 ENCOUNTER — Encounter: Payer: Self-pay | Admitting: Hematology

## 2023-12-30 ENCOUNTER — Inpatient Hospital Stay (HOSPITAL_COMMUNITY)

## 2023-12-30 ENCOUNTER — Telehealth (HOSPITAL_COMMUNITY): Payer: Self-pay | Admitting: Pharmacy Technician

## 2023-12-30 DIAGNOSIS — I249 Acute ischemic heart disease, unspecified: Secondary | ICD-10-CM | POA: Diagnosis not present

## 2023-12-30 DIAGNOSIS — I2510 Atherosclerotic heart disease of native coronary artery without angina pectoris: Secondary | ICD-10-CM

## 2023-12-30 DIAGNOSIS — I214 Non-ST elevation (NSTEMI) myocardial infarction: Principal | ICD-10-CM

## 2023-12-30 DIAGNOSIS — I2489 Other forms of acute ischemic heart disease: Secondary | ICD-10-CM | POA: Diagnosis not present

## 2023-12-30 DIAGNOSIS — E669 Obesity, unspecified: Secondary | ICD-10-CM | POA: Insufficient documentation

## 2023-12-30 DIAGNOSIS — I10 Essential (primary) hypertension: Secondary | ICD-10-CM

## 2023-12-30 HISTORY — PX: LEFT HEART CATH AND CORONARY ANGIOGRAPHY: CATH118249

## 2023-12-30 LAB — COMPREHENSIVE METABOLIC PANEL WITH GFR
ALT: 21 U/L (ref 0–44)
AST: 24 U/L (ref 15–41)
Albumin: 3.3 g/dL — ABNORMAL LOW (ref 3.5–5.0)
Alkaline Phosphatase: 45 U/L (ref 38–126)
Anion gap: 9 (ref 5–15)
BUN: 5 mg/dL — ABNORMAL LOW (ref 8–23)
CO2: 25 mmol/L (ref 22–32)
Calcium: 9 mg/dL (ref 8.9–10.3)
Chloride: 106 mmol/L (ref 98–111)
Creatinine, Ser: 0.85 mg/dL (ref 0.44–1.00)
GFR, Estimated: >60 mL/min (ref >60)
Glucose, Bld: 149 mg/dL — ABNORMAL HIGH (ref 70–99)
Potassium: 3.6 mmol/L (ref 3.5–5.1)
Sodium: 140 mmol/L (ref 135–145)
Total Bilirubin: 0.9 mg/dL (ref 0.0–1.2)
Total Protein: 6.6 g/dL (ref 6.5–8.1)

## 2023-12-30 LAB — CBC
HCT: 40.2 % (ref 36.0–46.0)
HCT: 39.4 % (ref 36.0–46.0)
Hemoglobin: 13.1 g/dL (ref 12.0–15.0)
Hemoglobin: 12.9 g/dL (ref 12.0–15.0)
MCH: 28.4 pg (ref 26.0–34.0)
MCH: 28.9 pg (ref 26.0–34.0)
MCHC: 32.6 g/dL (ref 30.0–36.0)
MCHC: 32.7 g/dL (ref 30.0–36.0)
MCV: 87.2 fL (ref 80.0–100.0)
MCV: 88.1 fL (ref 80.0–100.0)
Platelets: 286 10*3/uL (ref 150–400)
Platelets: 270 10*3/uL (ref 150–400)
RBC: 4.61 MIL/uL (ref 3.87–5.11)
RBC: 4.47 MIL/uL (ref 3.87–5.11)
RDW: 13 % (ref 11.5–15.5)
RDW: 12.9 % (ref 11.5–15.5)
WBC: 5.1 10*3/uL (ref 4.0–10.5)
WBC: 5.1 10*3/uL (ref 4.0–10.5)
nRBC: 0 % (ref 0.0–0.2)
nRBC: 0 % (ref 0.0–0.2)

## 2023-12-30 LAB — TROPONIN I (HIGH SENSITIVITY): Troponin I (High Sensitivity): 1609 ng/L (ref <18)

## 2023-12-30 LAB — ECHOCARDIOGRAM COMPLETE
AR max vel: 2.88 cm2
AV Area VTI: 3 cm2
AV Area mean vel: 2.84 cm2
AV Mean grad: 4 mmHg
AV Peak grad: 8 mmHg
Ao pk vel: 1.41 m/s
Area-P 1/2: 2.83 cm2
Height: 63 in
S' Lateral: 2.85 cm
Weight: 2838.4 [oz_av]

## 2023-12-30 LAB — GLUCOSE, CAPILLARY
Glucose-Capillary: 114 mg/dL — ABNORMAL HIGH (ref 70–99)
Glucose-Capillary: 126 mg/dL — ABNORMAL HIGH (ref 70–99)
Glucose-Capillary: 170 mg/dL — ABNORMAL HIGH (ref 70–99)
Glucose-Capillary: 123 mg/dL — ABNORMAL HIGH (ref 70–99)

## 2023-12-30 LAB — HIV ANTIBODY (ROUTINE TESTING W REFLEX): HIV Screen 4th Generation wRfx: NONREACTIVE

## 2023-12-30 LAB — HEMOGLOBIN A1C
Hgb A1c MFr Bld: 7.5 % — ABNORMAL HIGH (ref 4.8–5.6)
Mean Plasma Glucose: 169 mg/dL

## 2023-12-30 LAB — CREATININE, SERUM
Creatinine, Ser: 0.8 mg/dL (ref 0.44–1.00)
GFR, Estimated: >60 mL/min (ref >60)

## 2023-12-30 LAB — HEPARIN LEVEL (UNFRACTIONATED): Heparin Unfractionated: 0.58 [IU]/mL (ref 0.30–0.70)

## 2023-12-30 SURGERY — LEFT HEART CATH AND CORONARY ANGIOGRAPHY
Anesthesia: LOCAL

## 2023-12-30 MED ORDER — FENTANYL CITRATE (PF) 100 MCG/2ML IJ SOLN
INTRAMUSCULAR | Status: DC | PRN
  Administered 2023-12-30: 25 ug via INTRAVENOUS

## 2023-12-30 MED ORDER — SODIUM CHLORIDE 0.9 % WEIGHT BASED INFUSION: 1.0000 mL/kg/h | INTRAVENOUS | Status: DC

## 2023-12-30 MED ORDER — ENOXAPARIN SODIUM 40 MG/0.4ML IJ SOSY
40.0000 mg | PREFILLED_SYRINGE | INTRAMUSCULAR | Status: DC
  Administered 2023-12-31: 40 mg via SUBCUTANEOUS
  Filled 2023-12-30: qty 0.4

## 2023-12-30 MED ORDER — CLOPIDOGREL BISULFATE 75 MG PO TABS
75.0000 mg | ORAL_TABLET | Freq: Every day | ORAL | Status: DC
  Administered 2023-12-30 – 2023-12-31 (×2): 75 mg via ORAL
  Filled 2023-12-30 (×2): qty 1

## 2023-12-30 MED ORDER — ISOSORBIDE MONONITRATE ER 30 MG PO TB24
30.0000 mg | ORAL_TABLET | Freq: Every day | ORAL | Status: DC
  Administered 2023-12-30 – 2023-12-31 (×2): 30 mg via ORAL
  Filled 2023-12-30 (×2): qty 1

## 2023-12-30 MED ORDER — IOHEXOL 350 MG/ML SOLN
INTRAVENOUS | Status: DC | PRN
  Administered 2023-12-30: 40 mL

## 2023-12-30 MED ORDER — FENTANYL CITRATE (PF) 100 MCG/2ML IJ SOLN
INTRAMUSCULAR | Status: AC
Start: 2023-12-30 — End: ?
  Filled 2023-12-30: qty 2

## 2023-12-30 MED ORDER — HEPARIN SODIUM (PORCINE) 1000 UNIT/ML IJ SOLN
INTRAMUSCULAR | Status: DC | PRN
  Administered 2023-12-30: 4000 [IU] via INTRAVENOUS

## 2023-12-30 MED ORDER — MIDAZOLAM HCL 2 MG/2ML IJ SOLN
INTRAMUSCULAR | Status: AC
  Filled 2023-12-30: qty 2

## 2023-12-30 MED ORDER — MIDAZOLAM HCL 2 MG/2ML IJ SOLN
INTRAMUSCULAR | Status: DC | PRN
  Administered 2023-12-30: 2 mg via INTRAVENOUS

## 2023-12-30 MED ORDER — HEPARIN SODIUM (PORCINE) 1000 UNIT/ML IJ SOLN
INTRAMUSCULAR | Status: AC
  Filled 2023-12-30: qty 10

## 2023-12-30 MED ORDER — VERAPAMIL HCL 2.5 MG/ML IV SOLN
INTRAVENOUS | Status: DC | PRN
  Administered 2023-12-30: 10 mL via INTRA_ARTERIAL

## 2023-12-30 MED ORDER — ASPIRIN 81 MG PO CHEW: 81.0000 mg | CHEWABLE_TABLET | ORAL | Status: DC

## 2023-12-30 MED ORDER — SODIUM CHLORIDE 0.9 % IV SOLN: 250.0000 mL | INTRAVENOUS | Status: DC | PRN

## 2023-12-30 MED ORDER — SODIUM CHLORIDE 0.9 % WEIGHT BASED INFUSION
3.0000 mL/kg/h | INTRAVENOUS | Status: DC
  Administered 2023-12-30: 3 mL/kg/h via INTRAVENOUS

## 2023-12-30 MED ORDER — LIDOCAINE HCL (PF) 1 % IJ SOLN
INTRAMUSCULAR | Status: AC
  Filled 2023-12-30: qty 30

## 2023-12-30 MED ORDER — VERAPAMIL HCL 2.5 MG/ML IV SOLN
INTRAVENOUS | Status: AC
  Filled 2023-12-30: qty 2

## 2023-12-30 MED ORDER — SODIUM CHLORIDE 0.9% FLUSH
3.0000 mL | Freq: Two times a day (BID) | INTRAVENOUS | Status: DC
  Administered 2023-12-30 – 2023-12-31 (×2): 3 mL via INTRAVENOUS

## 2023-12-30 MED ORDER — HEPARIN (PORCINE) IN NACL 1000-0.9 UT/500ML-% IV SOLN
INTRAVENOUS | Status: DC | PRN
  Administered 2023-12-30 (×2): 500 mL

## 2023-12-30 MED ORDER — LIDOCAINE HCL (PF) 1 % IJ SOLN
INTRAMUSCULAR | Status: DC | PRN
  Administered 2023-12-30: 2 mL

## 2023-12-30 MED ORDER — SODIUM CHLORIDE 0.9% FLUSH: 3.0000 mL | INTRAVENOUS | Status: DC | PRN

## 2023-12-30 SURGICAL SUPPLY — 12 items
CATH 5FR JL3.5 JR4 ANG PIG MP (CATHETERS) IMPLANT
CATH LAUNCHER 5F 3DRIGHT (CATHETERS) IMPLANT
CATHETER LAUNCHER 5F 3DRIGHT (CATHETERS) ×1 IMPLANT
DEVICE RAD COMP TR BAND LRG (VASCULAR PRODUCTS) IMPLANT
GLIDESHEATH SLEND SS 6F .021 (SHEATH) IMPLANT
GUIDEWIRE INQWIRE 1.5J.035X260 (WIRE) IMPLANT
GUIDEWIRE TIGER .035X300 (WIRE) IMPLANT
INQWIRE 1.5J .035X260CM (WIRE) ×1 IMPLANT
KIT SYRINGE INJ CVI SPIKEX1 (MISCELLANEOUS) IMPLANT
PACK CARDIAC CATHETERIZATION (CUSTOM PROCEDURE TRAY) ×2 IMPLANT
SET ATX-X65L (MISCELLANEOUS) IMPLANT
SHEATH PROBE COVER 6X72 (BAG) IMPLANT

## 2023-12-30 NOTE — Consult Note (Signed)
 Cardiology Consultation   Patient ID: [name] MRN: [id number]; DOB: 1956-04-16  Admit date: 12/29/2023 Date of Consult: 12/30/2023  PCP:  [name], MD   [facility] Providers Cardiologist:  None  Sleep Medicine:  Wilbert Bihari, MD  {   Patient Profile:   Tanya Walsh is a 68 y.o. 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 Celinda.  History of Present Illness:   Tanya Walsh patient with history as above.  Has had patient with history as above.  Has had coronary CTA in 2021 that demonstrated mixed CAD of the proximal RCA, proximal to mid LAD and ostial ramus intermedius.  FFR was performed, not hemodynamically significant.  CAC score 594.  She was last seen in office November 2023.  In regards to her cancer history she is followed by oncology and has stage IV disease.  First noted in June 2022.  Based off recent PET scan shows that she has progressive disease into the liver.  She has recently started Lutathera  therapy.  She also gets Sandostatin  injections.  Currently patient being evaluated for NSTEMI.  She reports that yesterday morning around 3 AM she started having significant diarrhea.  Started developing abdominal discomfort as well and started having chest pain.  Throughout the day became progressive and started radiating into her left arm.  No associated symptoms such as shortness of breath or any diaphoresis.  This has been intermittent but has had strong intensity.  Still reports some mild discomfort right now, however denies any shortness of breath, peripheral edema, orthopnea, palpitations, falls.    Troponins have gradually increased [phone].  Potassium 2.6.  Creatinine 0.85.  Hemoglobin 13.1.   Normal platelets 286.  CTA negative for PE.  CT of the abdominal pelvis showing no acute pathology in chronic changes noted on prior CT.   Past Medical History:  Diagnosis Date   Coronary artery disease    Depression    GI bleed 07/17/2015   Hypertension    Myocardial infarction [facility])    Nocturia 08/14/2019   Refusal of blood transfusions as patient is Jehovah's Witness     Past Surgical History:  Procedure Laterality Date   ABDOMINAL HYSTERECTOMY     BACK SURGERY     BIOPSY  12/10/2020   Procedure: BIOPSY;  Surgeon: Saintclair Jasper, MD;  Location: [facility] ENDOSCOPY;  Service: Gastroenterology;;   BIOPSY  12/11/2020   Procedure: BIOPSY;  Surgeon: Saintclair Jasper, MD;  Location: [facility] ENDOSCOPY;  Service: Gastroenterology;;   COLONOSCOPY WITH PROPOFOL  N/A 12/11/2020   Procedure: COLONOSCOPY WITH PROPOFOL ;  Surgeon: Saintclair Jasper, MD;  Location: [facility] ENDOSCOPY;  Service: Gastroenterology;  Laterality: N/A;   ESOPHAGOGASTRODUODENOSCOPY N/A 07/17/2015   Procedure: ESOPHAGOGASTRODUODENOSCOPY (EGD);  Surgeon: Lesta JULIANNA Fitz, MD;  Location: [facility] ENDOSCOPY;  Service: Endoscopy;  Laterality: N/A;   ESOPHAGOGASTRODUODENOSCOPY (EGD) WITH PROPOFOL  N/A 12/10/2020   Procedure: ESOPHAGOGASTRODUODENOSCOPY (EGD) WITH PROPOFOL ;  Surgeon: Saintclair Jasper, MD;  Location: [facility] ENDOSCOPY;  Service: Gastroenterology;  Laterality: N/A;   HEMOSTASIS CLIP PLACEMENT  12/10/2020   Procedure: HEMOSTASIS CLIP PLACEMENT;  Surgeon: Saintclair Jasper, MD;  Location: [facility] ENDOSCOPY;  Service: Gastroenterology;;    Inpatient Medications: Scheduled Meds:  aspirin   324 mg Oral NOW   Or   aspirin   300 mg Rectal NOW  aspirin  EC  81 mg Oral Daily   cyanocobalamin   1,000 mcg Oral q AM   DULoxetine   60 mg Oral q AM   folic acid   1,000 mcg Oral q AM   gabapentin   100 mg Oral QHS   losartan   50 mg Oral Daily   And   hydrochlorothiazide   12.5 mg Oral Daily   insulin  aspart  0-15 Units Subcutaneous TID WC   [START ON 01/01/2024] metFORMIN   500 mg Oral Q  breakfast   pantoprazole   40 mg Oral q AM   rosuvastatin   20 mg Oral q AM   Continuous Infusions:  heparin  850 Units/hr (12/29/23 2318)   PRN Meds: acetaminophen  **OR** acetaminophen , cyclobenzaprine , nitroGLYCERIN , ondansetron  (ZOFRAN ) IV, mouth rinse, traMADol   Allergies:    Allergies  Allergen Reactions   Bactrim [Sulfamethoxazole-Trimethoprim] Swelling    Social History:   Social History   Socioeconomic History   Marital status: Widowed    Spouse name: Not on file   Number of children: 3   Years of education: Not on file   Highest education level: Not on file  Occupational History   Not on file  Tobacco Use   Smoking status: Never   Smokeless tobacco: Never  Vaping Use   Vaping status: Never Used  Substance and Sexual Activity   Alcohol use: Yes    Comment: wine rarely   Drug use: No   Sexual activity: Not on file  Other Topics Concern   Not on file  Social History Narrative   Right handed   Drinks caffeine   One story home   Social Drivers of Health   Financial Resource Strain: Low Risk  (12/14/2023)   Received from [facility]   Overall Financial Resource Strain (CARDIA)    Difficulty of Paying Living Expenses: Not hard at all  Food Insecurity: No Food Insecurity (12/29/2023)   Hunger Vital Sign    Worried About Running Out of Food in the Last Year: Never true    Ran Out of Food in the Last Year: Never true  Transportation Needs: No Transportation Needs (12/29/2023)   PRAPARE - Administrator, Civil Service (Medical): No    Lack of Transportation (Non-Medical): No  Physical Activity: Insufficiently Active (12/14/2023)   Received from [facility]   Exercise Vital Sign    Days of Exercise per Week: 1 day    Minutes of Exercise per Session: 10 min  Stress: Stress Concern Present (12/14/2023)   Received from [facility] - Occupational Stress Questionnaire    Feeling of Stress : To some extent   Social Connections: Patient Unable To Answer (12/29/2023)   Social Connection and Isolation Panel [NHANES]    Frequency of Communication with Friends and Family: Patient unable to answer    Frequency of Social Gatherings with Friends and Family: Patient unable to answer    Attends Religious Services: Patient unable to answer    Active Member of Clubs or Organizations: Patient unable to answer    Attends Banker Meetings: Patient unable to answer    Marital Status: Patient unable to answer  Intimate Partner Violence: Not At Risk (12/29/2023)   Humiliation, Afraid, Rape, and Kick questionnaire    Fear of Current or Ex-Partner: No    Emotionally Abused: No    Physically Abused: No    Sexually Abused: No    Family History:   Family History  Problem Relation Age of Onset  Stroke Mother    Cancer Mother        brain   Heart disease Father    Heart disease Brother    Breast cancer Niece      ROS:  Please see the history of present illness.  All other ROS reviewed and negative.     Physical Exam/Data:   Vitals:   12/29/23 2100 12/29/23 2200 12/29/23 2250 12/30/23 0424  BP: (!) 164/93 130/77 129/78 (!) 147/72  Pulse:  61 61 71  Resp: 18 16 18 16   Temp:   98.8 F (37.1 C) 98.4 F (36.9 C)  TempSrc:   Oral Oral  SpO2:  90%  95%  Weight:   80.5 kg   Height:   5' 3 (1.6 m)     Intake/Output Summary (Last 24 hours) at 12/30/2023 0824 Last data filed at 12/29/2023 2318 Gross per 24 hour  Intake 1105.2 ml  Output --  Net 1105.2 ml      12/29/2023   10:50 PM 12/21/2023   10:42 AM 12/08/2023   11:04 AM  Last 3 Weights  Weight (lbs) 177 lb 6.4 oz 186 lb 4.8 oz 177 lb 8 oz  Weight (kg) 80.468 kg 84.505 kg 80.513 kg     Body mass index is 31.42 kg/m.  General:  Well nourished, well developed, in no acute distress HEENT: normal Neck: no JVD Vascular: No carotid bruits; Distal pulses 2+ bilaterally Cardiac:  normal S1, S2; RRR; no murmur  Lungs:  clear to  auscultation bilaterally, no wheezing, rhonchi or rales  Abd: soft, nontender, no hepatomegaly  Ext: no edema Musculoskeletal:  No deformities, BUE and BLE strength normal and equal Skin: warm and dry  Neuro:  CNs 2-12 intact, no focal abnormalities noted Psych:  Normal affect   EKG:  The EKG was personally reviewed and demonstrates: Sinus rhythm heart rate 65.  Overall no acute ST-T wave changes. Telemetry:  Telemetry was personally reviewed and demonstrates: Sinus rhythm heart rate 60s  Relevant CV Studies: Coronary CT 09/28/2019 1. Left Main:  No significant stenosis. FFR = 0.98   2. LAD: No significant stenosis. Proximal FFR = 0.91, Mid FFR = 0.84, Distal FFR = 0.80 3. LCX: No significant stenosis. Proximal FFR = 0.92, Distal FFR = 0.81 4. RCA: No significant stenosis. Proximal FFR = 0.99, Mid FFR = 0.88, Distal FFR = 0.85   IMPRESSION: 1.  CT FFR analysis did not show any significant stenosis.  1. Moderate mixed CAD of the proximal RCA, proximal to mid LAD and ostial ramus intermedius , CADRADS = 3. CT FFR will be performed and reported separately.   2. Coronary calcium  score of 594. This was 97th percentile for age and sex matched control.   3. Normal coronary origin with right dominance.  Laboratory Data:  High Sensitivity Troponin:   Recent Labs  Lab 12/29/23 0648 12/29/23 1255 12/29/23 1854 12/30/23 0440  TROPONINIHS 18* 730* 1,670* 1,609*     Chemistry Recent Labs  Lab 12/27/23 0848 12/29/23 0648 12/29/23 0748 12/29/23 1854 12/30/23 0440  NA 138 137 140  --  140  K 3.6 3.5 3.5  --  3.6  CL 100 103 108  --  106  CO2 29 27  --   --  25  GLUCOSE 163* 173* 150*  --  149*  BUN 12 10 7*  --  5*  CREATININE 0.89 0.85 0.80  --  0.85  CALCIUM  9.4 8.9  --   --  9.0  MG  --   --   --  2.1  --   GFRNONAA >60 >60  --   --  >60  ANIONGAP 9 7  --   --  9    Recent Labs  Lab 12/27/23 0848 12/29/23 0648 12/30/23 0440  PROT 7.8 6.9 6.6  ALBUMIN 4.3 3.8  3.3*  AST 25 23 24   ALT 24 25 21   ALKPHOS 60 52 45  BILITOT 0.6 0.7 0.9   Lipids No results for input(s): CHOL, TRIG, HDL, LABVLDL, LDLCALC, CHOLHDL in the last 168 hours.  Hematology Recent Labs  Lab 12/27/23 0848 12/29/23 9351 12/29/23 0748 12/30/23 0440  WBC 4.9 5.6  --  5.1  RBC 4.89 4.34  --  4.61  HGB 14.2 12.5 13.9 13.1  HCT 44.4 39.7 41.0 40.2  MCV 90.8 91.5  --  87.2  MCH 29.0 28.8  --  28.4  MCHC 32.0 31.5  --  32.6  RDW 12.9 13.0  --  13.0  PLT 292 265  --  286   Thyroid  No results for input(s): TSH, FREET4 in the last 168 hours.  BNPNo results for input(s): BNP, PROBNP in the last 168 hours.  DDimer No results for input(s): DDIMER in the last 168 hours.   Radiology/Studies:  CT Angio Chest PE W/Cm &/Or Wo Cm Result Date: 12/29/2023 CLINICAL DATA:  Pulmonary embolism (PE) suspected, high prob; Abdominal pain, acute, nonlocalized. Chest and abdominal pain, status post radiation therapy. Diarrhea. * Tracking Code: BO * EXAM: CT ANGIOGRAPHY CHEST CT ABDOMEN AND PELVIS WITH CONTRAST TECHNIQUE: Multidetector CT imaging of the chest was performed using the standard protocol during bolus administration of intravenous contrast. Multiplanar CT image reconstructions and MIPs were obtained to evaluate the vascular anatomy. Multidetector CT imaging of the abdomen and pelvis was performed using the standard protocol during bolus administration of intravenous contrast. RADIATION DOSE REDUCTION: This exam was performed according to the departmental dose-optimization program which includes automated exposure control, adjustment of the mA and/or kV according to patient size and/or use of iterative reconstruction technique. CONTRAST:  OMNIPAQUE  IOHEXOL  350 MG/ML SOLN COMPARISON:  Nuclear medicine PET scan from 10/07/2023 and CT scan chest, abdomen and pelvis from 08/25/2023. FINDINGS: CTA CHEST FINDINGS Cardiovascular: No evidence of embolism to the proximal  subsegmental pulmonary artery level. Normal cardiac size. No pericardial effusion. No aortic aneurysm. There are coronary artery calcifications, in keeping with coronary artery disease. There are also mild peripheral atherosclerotic vascular calcifications of thoracic aorta and its major branches. Mediastinum/Nodes: Visualized thyroid  gland appears grossly unremarkable. No solid / cystic mediastinal masses. The esophagus is nondistended precluding optimal assessment. No axillary, mediastinal or hilar lymphadenopathy by size criteria. Lungs/Pleura: The central tracheo-bronchial tree is patent. There are dependent changes in bilateral lungs. No mass or consolidation. No pleural effusion or pneumothorax. No suspicious lung nodules. Musculoskeletal: The visualized soft tissues of the chest wall are grossly unremarkable. No suspicious osseous lesions. There are mild multilevel degenerative changes in the visualized spine. Review of the MIP images confirms the above findings. CT ABDOMEN and PELVIS FINDINGS Hepatobiliary: The liver is normal in size. Non-cirrhotic configuration. Redemonstration of ill-defined hypoattenuating subcapsular approximately 1.8 x 2.2 cm lesion in the right hepatic lobe, segment 6, which corresponds to radiotracer avid lesion on the prior PET-CT scan from 10/07/2023. Exact comparison with PET-CT scan is difficult due to difference in technique however, lesion appears grossly similar in size. No new liver lesions seen. No intrahepatic or extrahepatic bile duct dilation.  No calcified gallstones. Normal gallbladder wall thickness. No pericholecystic inflammatory changes. Pancreas: Unremarkable. No pancreatic ductal dilatation or surrounding inflammatory changes. Spleen: Within normal limits. No focal lesion. Adrenals/Urinary Tract: Redemonstration of a stable 8 x 10 mm left adrenal nodule, indeterminate but was radiotracer avid on the prior PET-CT scan. Unremarkable right adrenal gland. No suspicious  renal mass. There are stable several subcentimeter sized hypoattenuating foci in the right kidney lower pole. No hydronephrosis. No renal or ureteric calculi. Unremarkable urinary bladder. Stomach/Bowel: No disproportionate dilation of the small or large bowel loops. No evidence of abnormal bowel wall thickening or inflammatory changes. The appendix is unremarkable. There are multiple diverticula mainly in the sigmoid colon, without imaging signs of diverticulitis. Vascular/Lymphatic: No ascites or pneumoperitoneum. No abdominal or pelvic lymphadenopathy, by size criteria. No aneurysmal dilation of the major abdominal arteries. There are moderate peripheral atherosclerotic vascular calcifications of the aorta and its major branches. Reproductive: The uterus is surgically absent. No large adnexal mass. Other: Previously described mesenteric or retroperitoneal nodules are not clearly seen on today's exam. Please correlate with tumor markers. There is tiny fat containing umbilical hernia. Musculoskeletal: No suspicious osseous lesions. There are mild - moderate multilevel degenerative changes in the visualized spine. There is grade 1 anterolisthesis of L3 over L4 and L4 over L5. Review of the MIP images confirms the above findings. IMPRESSION: 1. No embolism to the proximal subsegmental pulmonary artery level. 2. No lung mass, consolidation, pleural effusion or pneumothorax. 3. No acute inflammatory process identified within the abdomen or pelvis. 4. Redemonstration of ill-defined hypoattenuating lesion in the right hepatic lobe, segment 6, which corresponds to radiotracer avid lesion on the prior PET-CT scan. Exact comparison with PET-CT scan is difficult due to difference in technique however, lesion appears grossly similar in size. 5. Stable 8 x 10 mm left adrenal nodule, indeterminate but was radiotracer avid on the prior PET-CT scan. 6. Previously described mesenteric or retroperitoneal nodules are not clearly  seen on today's exam. Please correlate with tumor markers. 7. Multiple other nonacute observations, as described above. Aortic Atherosclerosis (ICD10-I70.0). Electronically Signed   By: Ree Molt M.D.   On: 12/29/2023 09:32   CT ABDOMEN PELVIS W CONTRAST Result Date: 12/29/2023 CLINICAL DATA:  Pulmonary embolism (PE) suspected, high prob; Abdominal pain, acute, nonlocalized. Chest and abdominal pain, status post radiation therapy. Diarrhea. * Tracking Code: BO * EXAM: CT ANGIOGRAPHY CHEST CT ABDOMEN AND PELVIS WITH CONTRAST TECHNIQUE: Multidetector CT imaging of the chest was performed using the standard protocol during bolus administration of intravenous contrast. Multiplanar CT image reconstructions and MIPs were obtained to evaluate the vascular anatomy. Multidetector CT imaging of the abdomen and pelvis was performed using the standard protocol during bolus administration of intravenous contrast. RADIATION DOSE REDUCTION: This exam was performed according to the departmental dose-optimization program which includes automated exposure control, adjustment of the mA and/or kV according to patient size and/or use of iterative reconstruction technique. CONTRAST:  OMNIPAQUE  IOHEXOL  350 MG/ML SOLN COMPARISON:  Nuclear medicine PET scan from 10/07/2023 and CT scan chest, abdomen and pelvis from 08/25/2023. FINDINGS: CTA CHEST FINDINGS Cardiovascular: No evidence of embolism to the proximal subsegmental pulmonary artery level. Normal cardiac size. No pericardial effusion. No aortic aneurysm. There are coronary artery calcifications, in keeping with coronary artery disease. There are also mild peripheral atherosclerotic vascular calcifications of thoracic aorta and its major branches. Mediastinum/Nodes: Visualized thyroid  gland appears grossly unremarkable. No solid / cystic mediastinal masses. The esophagus is nondistended precluding optimal assessment.  No axillary, mediastinal or hilar lymphadenopathy by  size criteria. Lungs/Pleura: The central tracheo-bronchial tree is patent. There are dependent changes in bilateral lungs. No mass or consolidation. No pleural effusion or pneumothorax. No suspicious lung nodules. Musculoskeletal: The visualized soft tissues of the chest wall are grossly unremarkable. No suspicious osseous lesions. There are mild multilevel degenerative changes in the visualized spine. Review of the MIP images confirms the above findings. CT ABDOMEN and PELVIS FINDINGS Hepatobiliary: The liver is normal in size. Non-cirrhotic configuration. Redemonstration of ill-defined hypoattenuating subcapsular approximately 1.8 x 2.2 cm lesion in the right hepatic lobe, segment 6, which corresponds to radiotracer avid lesion on the prior PET-CT scan from 10/07/2023. Exact comparison with PET-CT scan is difficult due to difference in technique however, lesion appears grossly similar in size. No new liver lesions seen. No intrahepatic or extrahepatic bile duct dilation. No calcified gallstones. Normal gallbladder wall thickness. No pericholecystic inflammatory changes. Pancreas: Unremarkable. No pancreatic ductal dilatation or surrounding inflammatory changes. Spleen: Within normal limits. No focal lesion. Adrenals/Urinary Tract: Redemonstration of a stable 8 x 10 mm left adrenal nodule, indeterminate but was radiotracer avid on the prior PET-CT scan. Unremarkable right adrenal gland. No suspicious renal mass. There are stable several subcentimeter sized hypoattenuating foci in the right kidney lower pole. No hydronephrosis. No renal or ureteric calculi. Unremarkable urinary bladder. Stomach/Bowel: No disproportionate dilation of the small or large bowel loops. No evidence of abnormal bowel wall thickening or inflammatory changes. The appendix is unremarkable. There are multiple diverticula mainly in the sigmoid colon, without imaging signs of diverticulitis. Vascular/Lymphatic: No ascites or pneumoperitoneum. No  abdominal or pelvic lymphadenopathy, by size criteria. No aneurysmal dilation of the major abdominal arteries. There are moderate peripheral atherosclerotic vascular calcifications of the aorta and its major branches. Reproductive: The uterus is surgically absent. No large adnexal mass. Other: Previously described mesenteric or retroperitoneal nodules are not clearly seen on today's exam. Please correlate with tumor markers. There is tiny fat containing umbilical hernia. Musculoskeletal: No suspicious osseous lesions. There are mild - moderate multilevel degenerative changes in the visualized spine. There is grade 1 anterolisthesis of L3 over L4 and L4 over L5. Review of the MIP images confirms the above findings. IMPRESSION: 1. No embolism to the proximal subsegmental pulmonary artery level. 2. No lung mass, consolidation, pleural effusion or pneumothorax. 3. No acute inflammatory process identified within the abdomen or pelvis. 4. Redemonstration of ill-defined hypoattenuating lesion in the right hepatic lobe, segment 6, which corresponds to radiotracer avid lesion on the prior PET-CT scan. Exact comparison with PET-CT scan is difficult due to difference in technique however, lesion appears grossly similar in size. 5. Stable 8 x 10 mm left adrenal nodule, indeterminate but was radiotracer avid on the prior PET-CT scan. 6. Previously described mesenteric or retroperitoneal nodules are not clearly seen on today's exam. Please correlate with tumor markers. 7. Multiple other nonacute observations, as described above. Aortic Atherosclerosis (ICD10-I70.0). Electronically Signed   By: Ree Molt M.D.   On: 12/29/2023 09:32   DG Chest Portable 1 View Result Date: 12/29/2023 CLINICAL DATA:  68 year old female with chest and abdominal pain. Metastatic neuroendocrine tumor. EXAM: PORTABLE CHEST 1 VIEW COMPARISON:  Restaging CT Chest, Abdomen, and Pelvis 08/25/2023. Chest radiographs 04/18/2023. FINDINGS: Portable AP  semi upright view at 0657 hours. Stable tortuosity of the thoracic aorta with otherwise normal heart and mediastinal contours. Visualized tracheal air column is within normal limits. Lung volumes are stable and within normal limits. Allowing for portable  technique the lungs are clear. No pneumothorax or pleural effusion. Stable visualized osseous structures. Nonobstructed visible bowel gas pattern. IMPRESSION: No acute cardiopulmonary abnormality. Electronically Signed   By: VEAR Hurst M.D.   On: 12/29/2023 07:04   NM LUTATHERA  ADMINISTRATION Result Date: 12/27/2023 CLINICAL DATA:  68 year-old female with metastatic neuroendocrine tumor. Well differentiated tumor with somatostatin receptor is identified within the liver and associated small bowel lesions by DOTATATE PET CT scan. EXAM: NUCLEAR MEDICINE LUTATHERA  ADMINISTRATION TECHNIQUE: Infusion: The nuclear medicine technologist and I personally verified the dose activity (202 mCi) to be delivered as specified in the written directive (200 mCi), and verified the patient identification via 2 separate methods. 20 gauge IV were started in the antecubital veins. Anti-emetics were administered by nursing staff. Amino acid renal protection was initiated 30 minutes prior to Lu 177 DOTATATE (Lutathera ) infusion and continued continuously for 4 hours. Lutathera  infusion was administered over 30 minutes. The total administered dose was 202 mCi Lu 177 DOTATATE. The entire IV tubing, venocatheter, stopcock and syringes was removed in total, placed in a disposal bag and sent for assay of the residual activity, which will be reported at a later time in our EMR by the physics staff. Pressure was applied to the venipuncture sites, and a compression bandage placed. Radiation Safety personnel were present to perform the discharge survey, as detailed on their documentation. Sandostatin  injection deferred as patient received Sandostatin  on 12/08/2023. RADIOPHARMACEUTICALS:  Two  hundred two mCi Lu 177 DOTATATE FINDINGS: Diagnosis: Metastatic neuroendocrine tumor. Current Infusion: 1 Planned Infusions: 4 Patient and patient's friend were again informed of the risks and benefits procedure. All questions were answered. Consent forms signed. The patient's most recent blood counts were reviewed and remains a good candidate to proceed with Lutathera . The patient was situated in an infusion suite and administered Lutathera  as above. Patient will follow-up with referring oncologist for interval serum laboratories (CBC and CMP) in approximately 4 weeks. IMPRESSION: First Lu 177 DOTATATE treatment for metastatic neuroendocrine tumor. The patient tolerated the infusion well. The patient will return in 8 weeks for ongoing care. Electronically Signed   By: Jackquline Boxer M.D.   On: 12/27/2023 13:31     Assessment and Plan:   NSTEMI Reporting new complaints of chest pain with radiation into her left arm.  EKG with no acute ST-T wave changes.  Troponins gradually climbing and peaked at 1670.  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  Will plan for cardiac catheterization.  Of note she is a [employer] Witness and declines any blood transfusion however is okay should she need DAPT therapy.  Declines any blood transfusion however is okay should she need DAPT therapy.  This is also still within the scope of her GOC.  Stable hemoglobin and platelets. Continue with IV heparin , give aspirin , no beta-blocker with borderline bradycardia, can continue rosuvastatin  20 mg for the time being, check lipid panel Echocardiogram pending  Informed Consent   Shared Decision Making/Informed Consent{  The risks [stroke (1 in 1000), death (1 in 1000), kidney failure [usually temporary] (1 in 500), bleeding (1 in 200), allergic reaction [possibly serious] (1 in 200)], benefits (diagnostic support and management of coronary artery disease) and alternatives of a  cardiac catheterization were discussed in detail with Ms. Economos and she is willing to proceed.  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  Seems to have progressive disease although no terminal diagnosis.  On Lutathera  in Sandostatin . Will discuss with MD to ensure that this has no interactions with DAPT or any other cardiac related medications.  OSA on CPAP  Type 2 diabetes A1c 7.5%.  May consider SGLT2 inhibitor.  She may resume her metformin  greater than 48 hours after cardiac catheterization.  Jehov[employer]'s Witness No blood products.       Risk Assessment/Risk Scores:   TIMI Risk Score for Unstable Angina or Non-ST Elevation MI:   The patient's TIMI risk score is 5, which indicates a 26% risk of all cause mortality, new or recurrent myocardial infarction or need for urgent revascularization in the next 14 days.       For questions or updates, please contact [facility] Please consult [email or web address] for contact info under    Signed, Thom LITTIE Sluder, PA-C  12/30/2023 8:24 AM

## 2023-12-30 NOTE — Telephone Encounter (Signed)
 Patient Product/process development scientist completed.    The patient is insured through [employer]. Patient has Medicare and is not eligible for a copay card, but may be able to apply for patient assistance or Medicare RX Payment Plan (Patient Must reach out to their plan, if eligible for payment plan), if available.    Ran test claim for Farixga 10 mg and the current 30 day co-pay is $302.00 due to a $255.00 deductible.  Will be $47.00 once deductible is met.  Ran test claim for Jardiance 10 mg and the current 30 day co-pay is $302.00 due to a $255.00 deductible.  Will be $47.00 once deductible is met.   This test claim was processed through [facility]- copay amounts may vary at other pharmacies due to pharmacy/plan contracts, or as the patient moves through the different stages of their insurance plan.     Roland Earl, CPHT Pharmacy Technician III Certified Patient Advocate [facility] Patient Advocate Team Direct Number: [phone]  Fax: [phone]

## 2023-12-30 NOTE — Hospital Course (Addendum)
 68 y.o. 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  In the ER, found to have troponin 1670.  Admitted on heparin .

## 2023-12-30 NOTE — Assessment & Plan Note (Signed)
 -  CPAP at night

## 2023-12-30 NOTE — Progress Notes (Signed)
   12/30/23 2034  BiPAP/CPAP/SIPAP  Reason BIPAP/CPAP not in use Non-compliant   Pt refused CPAP for nighttime use.

## 2023-12-30 NOTE — Progress Notes (Signed)
 PHARMACY - ANTICOAGULATION  Pharmacy Consult for heparin   Indication: chest pain/ACS Brief A/P: Heparin  level within goal range Continue Heparin  at current rate   Allergies  Allergen Reactions   Bactrim [Sulfamethoxazole-Trimethoprim] Swelling    Patient Measurements: Height: 5\' 3"  (160 cm) Weight: 80.5 kg (177 lb 6.4 oz) IBW/kg (Calculated) : 52.4 HEPARIN  DW (KG): 70HDW  = 71.2 Kg TBW 84.5 kg  Vital Signs: Temp: 98.4 F (36.9 C) (04/18 0424) Temp Source: Oral (04/18 0424) BP: 147/72 (04/18 0424) Pulse Rate: 71 (04/18 0424)  Labs: Recent Labs    12/27/23 0848 12/27/23 0848 12/29/23 0648 12/29/23 0748 12/29/23 1255 12/29/23 1854 12/30/23 0440  HGB 14.2  --  12.5 13.9  --   --  13.1  HCT 44.4  --  39.7 41.0  --   --  40.2  PLT 292  --  265  --   --   --  286  HEPARINUNFRC  --   --   --   --   --   --  0.58  CREATININE 0.89  --  0.85 0.80  --   --  0.85  TROPONINIHS  --    < > 18*  --  730* 1,670* 1,609*   < > = values in this interval not displayed.    Estimated Creatinine Clearance: 61.8 mL/min (by C-G formula based on SCr of 0.85 mg/dL).   Assessment: 68 y.o. female with chest pain for heparin    Goal of Therapy:  Heparin  level 0.3-0.7 units/ml Monitor platelets by anticoagulation protocol: Yes   Plan:  No change to heparin    Claudine Cullens, PharmD, BCPS  12/30/2023 6:42 AM

## 2023-12-30 NOTE — Assessment & Plan Note (Signed)
 Blood pressure controlled - Continue losartan , HCTZ

## 2023-12-30 NOTE — Assessment & Plan Note (Addendum)
 Admitted with waxing and waning CP radiating to arm without clear exertional component.  Troponin peaked at 1670.  Cardiology consulted, started on heparin  infusion.  LHC 4/18 showed 70% RCA stenosis, OMT recommended.   - Continue aspirin  - Start Plavix , Imdur  - Defer heparin  to Cardiology - Continue Crestor  - Follow-up echocardiogram - Continue losartan  - Deferred beta-blocker to cardiology

## 2023-12-30 NOTE — Progress Notes (Signed)
 Coronary angiogram reviewed.  No obstructive disease.  Echocardiogram with normal EF, no RV enlargement.  Low suspicion for PE.  Recommend medical management for CAD.  Reasonable to treat with dual antiplatelet therapy with aspirin  and Plavix  given troponin elevation, duration 1 year.  Continue losartan /hydrochlorothiazide , recommend metoprolol  succinate 25 mg daily instead of Imdur  on discharge, along with sublingual nitroglycerin .    Okay to discharge from cardiac standpoint.  [name], MD

## 2023-12-30 NOTE — Progress Notes (Signed)
 Mobility Specialist Progress Note;   12/30/23 0913  Mobility  Activity Ambulated with assistance in hallway  Level of Assistance Standby assist, set-up cues, supervision of patient - no hands on  Assistive Device Other (Comment) (IV pole)  Distance Ambulated (ft) 400 ft  Activity Response Tolerated well  Mobility Referral Yes  Mobility visit 1 Mobility  Mobility Specialist Start Time (ACUTE ONLY) 0913  Mobility Specialist Stop Time (ACUTE ONLY) [id number]  Mobility Specialist Time Calculation (min) (ACUTE ONLY) 8 min   Pt eager for mobility. Required no physical assistance during ambulation, SV. VSS throughout and no c/o when asked. Pt returned back to bed with all needs met.   Lauraine Erm Mobility Specialist Please contact via SecureChat or [employer] [phone]

## 2023-12-30 NOTE — Assessment & Plan Note (Signed)
 With hyperglycemia 246 on admission. - Hold metformin - Start SS corrections

## 2023-12-30 NOTE — Plan of Care (Signed)
   Problem: Education: Goal: Knowledge of General Education information will improve Description Including pain rating scale, medication(s)/side effects and non-pharmacologic comfort measures Outcome: Progressing   Problem: Health Behavior/Discharge Planning: Goal: Ability to manage health-related needs will improve Outcome: Progressing

## 2023-12-30 NOTE — Assessment & Plan Note (Addendum)
 Class 1, BMI 31, complicates care

## 2023-12-30 NOTE — Plan of Care (Signed)
  Problem: Education: Goal: Understanding of cardiac disease, CV risk reduction, and recovery process will improve Outcome: Progressing Goal: Individualized Educational Video(s) Outcome: Progressing   Problem: Activity: Goal: Ability to tolerate inc

## 2023-12-30 NOTE — Assessment & Plan Note (Signed)
 Metastatic to liver On Lutathera  as an outpatient. - Continue Octreotide 

## 2023-12-30 NOTE — Progress Notes (Signed)
  Echocardiogram 2D Echocardiogram has been performed.  Alix Aquas Bardia Wangerin 12/30/2023, 9:45 AM

## 2023-12-30 NOTE — H&P (View-Only) (Signed)
 Cardiology Consultation   Patient ID: Tanya Walsh MRN: [id number]; DOB: 1955-12-31  Admit date: 12/29/2023 Date of Consult: 12/30/2023  PCP:  [name], MD   [facility] Providers Cardiologist:  None  Sleep Medicine:  Wilbert Bihari, MD  {   Patient Profile:   Tanya Walsh is a 68 y.o. 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 Celinda.  History of Present Illness:   [name] patient with history as above.  Has had patient with history as above.  Has had coronary CTA in 2021 that demonstrated mixed CAD of the proximal RCA, proximal to mid LAD and ostial ramus intermedius.  FFR was performed, not hemodynamically significant.  CAC score 594.  She was last seen in office November 2023.  In regards to her cancer history she is followed by oncology and has stage IV disease.  First noted in June 2022.  Based off recent PET scan shows that she has progressive disease into the liver.  She has recently started Lutathera  therapy.  She also gets Sandostatin  injections.  Currently patient being evaluated for NSTEMI.  She reports that yesterday morning around 3 AM she started having significant diarrhea.  Started developing abdominal discomfort as well and started having chest pain.  Throughout the day became progressive and started radiating into her left arm.  No associated symptoms such as shortness of breath or any diaphoresis.  This has been intermittent but has had strong intensity.  Still reports some mild discomfort right now, however denies any shortness of breath, peripheral edema, orthopnea, palpitations, falls.    Troponins have gradually increased [phone].  Potassium 2.6.  Creatinine 0.85.  Hemoglobin 13.1.   Normal platelets 286.  CTA negative for PE.  CT of the abdominal pelvis showing no acute pathology in chronic changes noted on prior CT.   Past Medical History:  Diagnosis Date   Coronary artery disease    Depression    GI bleed 07/17/2015   Hypertension    Myocardial infarction [facility])    Nocturia 08/14/2019   Refusal of blood transfusions as patient is Jehovah's Witness     Past Surgical History:  Procedure Laterality Date   ABDOMINAL HYSTERECTOMY     BACK SURGERY     BIOPSY  12/10/2020   Procedure: BIOPSY;  Surgeon: Saintclair Jasper, MD;  Location: [facility] ENDOSCOPY;  Service: Gastroenterology;;   BIOPSY  12/11/2020   Procedure: BIOPSY;  Surgeon: Saintclair Jasper, MD;  Location: [facility] ENDOSCOPY;  Service: Gastroenterology;;   COLONOSCOPY WITH PROPOFOL  N/A 12/11/2020   Procedure: COLONOSCOPY WITH PROPOFOL ;  Surgeon: Saintclair Jasper, MD;  Location: [facility] ENDOSCOPY;  Service: Gastroenterology;  Laterality: N/A;   ESOPHAGOGASTRODUODENOSCOPY N/A 07/17/2015   Procedure: ESOPHAGOGASTRODUODENOSCOPY (EGD);  Surgeon: Lesta JULIANNA Fitz, MD;  Location: [facility] ENDOSCOPY;  Service: Endoscopy;  Laterality: N/A;   ESOPHAGOGASTRODUODENOSCOPY (EGD) WITH PROPOFOL  N/A 12/10/2020   Procedure: ESOPHAGOGASTRODUODENOSCOPY (EGD) WITH PROPOFOL ;  Surgeon: Saintclair Jasper, MD;  Location: [facility] ENDOSCOPY;  Service: Gastroenterology;  Laterality: N/A;   HEMOSTASIS CLIP PLACEMENT  12/10/2020   Procedure: HEMOSTASIS CLIP PLACEMENT;  Surgeon: Saintclair Jasper, MD;  Location: [facility] ENDOSCOPY;  Service: Gastroenterology;;    Inpatient Medications: Scheduled Meds:  aspirin   324 mg Oral NOW   Or   aspirin   300 mg Rectal NOW  aspirin  EC  81 mg Oral Daily   cyanocobalamin   1,000 mcg Oral q AM   DULoxetine   60 mg Oral q AM   folic acid   1,000 mcg Oral q AM   gabapentin   100 mg Oral QHS   losartan   50 mg Oral Daily   And   hydrochlorothiazide   12.5 mg Oral Daily   insulin  aspart  0-15 Units Subcutaneous TID WC   [START ON 01/01/2024] metFORMIN   500 mg Oral Q  breakfast   pantoprazole   40 mg Oral q AM   rosuvastatin   20 mg Oral q AM   Continuous Infusions:  heparin  850 Units/hr (12/29/23 2318)   PRN Meds: acetaminophen  **OR** acetaminophen , cyclobenzaprine , nitroGLYCERIN , ondansetron  (ZOFRAN ) IV, mouth rinse, traMADol   Allergies:    Allergies  Allergen Reactions   Bactrim [Sulfamethoxazole-Trimethoprim] Swelling    Social History:   Social History   Socioeconomic History   Marital status: Widowed    Spouse name: Not on file   Number of children: 3   Years of education: Not on file   Highest education level: Not on file  Occupational History   Not on file  Tobacco Use   Smoking status: Never   Smokeless tobacco: Never  Vaping Use   Vaping status: Never Used  Substance and Sexual Activity   Alcohol use: Yes    Comment: wine rarely   Drug use: No   Sexual activity: Not on file  Other Topics Concern   Not on file  Social History Narrative   Right handed   Drinks caffeine   One story home   Social Drivers of Health   Financial Resource Strain: Low Risk  (12/14/2023)   Received from [facility]   Overall Financial Resource Strain (CARDIA)    Difficulty of Paying Living Expenses: Not hard at all  Food Insecurity: No Food Insecurity (12/29/2023)   Hunger Vital Sign    Worried About Running Out of Food in the Last Year: Never true    Ran Out of Food in the Last Year: Never true  Transportation Needs: No Transportation Needs (12/29/2023)   PRAPARE - Administrator, Civil Service (Medical): No    Lack of Transportation (Non-Medical): No  Physical Activity: Insufficiently Active (12/14/2023)   Received from [facility]   Exercise Vital Sign    Days of Exercise per Week: 1 day    Minutes of Exercise per Session: 10 min  Stress: Stress Concern Present (12/14/2023)   Received from [facility] - Occupational Stress Questionnaire    Feeling of Stress : To some extent   Social Connections: Patient Unable To Answer (12/29/2023)   Social Connection and Isolation Panel [NHANES]    Frequency of Communication with Friends and Family: Patient unable to answer    Frequency of Social Gatherings with Friends and Family: Patient unable to answer    Attends Religious Services: Patient unable to answer    Active Member of Clubs or Organizations: Patient unable to answer    Attends Banker Meetings: Patient unable to answer    Marital Status: Patient unable to answer  Intimate Partner Violence: Not At Risk (12/29/2023)   Humiliation, Afraid, Rape, and Kick questionnaire    Fear of Current or Ex-Partner: No    Emotionally Abused: No    Physically Abused: No    Sexually Abused: No    Family History:   Family History  Problem Relation Age of Onset  Stroke Mother    Cancer Mother        brain   Heart disease Father    Heart disease Brother    Breast cancer Niece      ROS:  Please see the history of present illness.  All other ROS reviewed and negative.     Physical Exam/Data:   Vitals:   12/29/23 2100 12/29/23 2200 12/29/23 2250 12/30/23 0424  BP: (!) 164/93 130/77 129/78 (!) 147/72  Pulse:  61 61 71  Resp: 18 16 18 16   Temp:   98.8 F (37.1 C) 98.4 F (36.9 C)  TempSrc:   Oral Oral  SpO2:  90%  95%  Weight:   80.5 kg   Height:   5' 3 (1.6 m)     Intake/Output Summary (Last 24 hours) at 12/30/2023 0824 Last data filed at 12/29/2023 2318 Gross per 24 hour  Intake 1105.2 ml  Output --  Net 1105.2 ml      12/29/2023   10:50 PM 12/21/2023   10:42 AM 12/08/2023   11:04 AM  Last 3 Weights  Weight (lbs) 177 lb 6.4 oz 186 lb 4.8 oz 177 lb 8 oz  Weight (kg) 80.468 kg 84.505 kg 80.513 kg     Body mass index is 31.42 kg/m.  General:  Well nourished, well developed, in no acute distress HEENT: normal Neck: no JVD Vascular: No carotid bruits; Distal pulses 2+ bilaterally Cardiac:  normal S1, S2; RRR; no murmur  Lungs:  clear to  auscultation bilaterally, no wheezing, rhonchi or rales  Abd: soft, nontender, no hepatomegaly  Ext: no edema Musculoskeletal:  No deformities, BUE and BLE strength normal and equal Skin: warm and dry  Neuro:  CNs 2-12 intact, no focal abnormalities noted Psych:  Normal affect   EKG:  The EKG was personally reviewed and demonstrates: Sinus rhythm heart rate 65.  Overall no acute ST-T wave changes. Telemetry:  Telemetry was personally reviewed and demonstrates: Sinus rhythm heart rate 60s  Relevant CV Studies: Coronary CT 09/28/2019 1. Left Main:  No significant stenosis. FFR = 0.98   2. LAD: No significant stenosis. Proximal FFR = 0.91, Mid FFR = 0.84, Distal FFR = 0.80 3. LCX: No significant stenosis. Proximal FFR = 0.92, Distal FFR = 0.81 4. RCA: No significant stenosis. Proximal FFR = 0.99, Mid FFR = 0.88, Distal FFR = 0.85   IMPRESSION: 1.  CT FFR analysis did not show any significant stenosis.  1. Moderate mixed CAD of the proximal RCA, proximal to mid LAD and ostial ramus intermedius , CADRADS = 3. CT FFR will be performed and reported separately.   2. Coronary calcium  score of 594. This was 97th percentile for age and sex matched control.   3. Normal coronary origin with right dominance.  Laboratory Data:  High Sensitivity Troponin:   Recent Labs  Lab 12/29/23 0648 12/29/23 1255 12/29/23 1854 12/30/23 0440  TROPONINIHS 18* 730* 1,670* 1,609*     Chemistry Recent Labs  Lab 12/27/23 0848 12/29/23 0648 12/29/23 0748 12/29/23 1854 12/30/23 0440  NA 138 137 140  --  140  K 3.6 3.5 3.5  --  3.6  CL 100 103 108  --  106  CO2 29 27  --   --  25  GLUCOSE 163* 173* 150*  --  149*  BUN 12 10 7*  --  5*  CREATININE 0.89 0.85 0.80  --  0.85  CALCIUM  9.4 8.9  --   --  9.0  MG  --   --   --  2.1  --   GFRNONAA >60 >60  --   --  >60  ANIONGAP 9 7  --   --  9    Recent Labs  Lab 12/27/23 0848 12/29/23 0648 12/30/23 0440  PROT 7.8 6.9 6.6  ALBUMIN 4.3 3.8  3.3*  AST 25 23 24   ALT 24 25 21   ALKPHOS 60 52 45  BILITOT 0.6 0.7 0.9   Lipids No results for input(s): CHOL, TRIG, HDL, LABVLDL, LDLCALC, CHOLHDL in the last 168 hours.  Hematology Recent Labs  Lab 12/27/23 0848 12/29/23 9351 12/29/23 0748 12/30/23 0440  WBC 4.9 5.6  --  5.1  RBC 4.89 4.34  --  4.61  HGB 14.2 12.5 13.9 13.1  HCT 44.4 39.7 41.0 40.2  MCV 90.8 91.5  --  87.2  MCH 29.0 28.8  --  28.4  MCHC 32.0 31.5  --  32.6  RDW 12.9 13.0  --  13.0  PLT 292 265  --  286   Thyroid  No results for input(s): TSH, FREET4 in the last 168 hours.  BNPNo results for input(s): BNP, PROBNP in the last 168 hours.  DDimer No results for input(s): DDIMER in the last 168 hours.   Radiology/Studies:  CT Angio Chest PE W/Cm &/Or Wo Cm Result Date: 12/29/2023 CLINICAL DATA:  Pulmonary embolism (PE) suspected, high prob; Abdominal pain, acute, nonlocalized. Chest and abdominal pain, status post radiation therapy. Diarrhea. * Tracking Code: BO * EXAM: CT ANGIOGRAPHY CHEST CT ABDOMEN AND PELVIS WITH CONTRAST TECHNIQUE: Multidetector CT imaging of the chest was performed using the standard protocol during bolus administration of intravenous contrast. Multiplanar CT image reconstructions and MIPs were obtained to evaluate the vascular anatomy. Multidetector CT imaging of the abdomen and pelvis was performed using the standard protocol during bolus administration of intravenous contrast. RADIATION DOSE REDUCTION: This exam was performed according to the departmental dose-optimization program which includes automated exposure control, adjustment of the mA and/or kV according to patient size and/or use of iterative reconstruction technique. CONTRAST:  OMNIPAQUE  IOHEXOL  350 MG/ML SOLN COMPARISON:  Nuclear medicine PET scan from 10/07/2023 and CT scan chest, abdomen and pelvis from 08/25/2023. FINDINGS: CTA CHEST FINDINGS Cardiovascular: No evidence of embolism to the proximal  subsegmental pulmonary artery level. Normal cardiac size. No pericardial effusion. No aortic aneurysm. There are coronary artery calcifications, in keeping with coronary artery disease. There are also mild peripheral atherosclerotic vascular calcifications of thoracic aorta and its major branches. Mediastinum/Nodes: Visualized thyroid  gland appears grossly unremarkable. No solid / cystic mediastinal masses. The esophagus is nondistended precluding optimal assessment. No axillary, mediastinal or hilar lymphadenopathy by size criteria. Lungs/Pleura: The central tracheo-bronchial tree is patent. There are dependent changes in bilateral lungs. No mass or consolidation. No pleural effusion or pneumothorax. No suspicious lung nodules. Musculoskeletal: The visualized soft tissues of the chest wall are grossly unremarkable. No suspicious osseous lesions. There are mild multilevel degenerative changes in the visualized spine. Review of the MIP images confirms the above findings. CT ABDOMEN and PELVIS FINDINGS Hepatobiliary: The liver is normal in size. Non-cirrhotic configuration. Redemonstration of ill-defined hypoattenuating subcapsular approximately 1.8 x 2.2 cm lesion in the right hepatic lobe, segment 6, which corresponds to radiotracer avid lesion on the prior PET-CT scan from 10/07/2023. Exact comparison with PET-CT scan is difficult due to difference in technique however, lesion appears grossly similar in size. No new liver lesions seen. No intrahepatic or extrahepatic bile duct dilation.  No calcified gallstones. Normal gallbladder wall thickness. No pericholecystic inflammatory changes. Pancreas: Unremarkable. No pancreatic ductal dilatation or surrounding inflammatory changes. Spleen: Within normal limits. No focal lesion. Adrenals/Urinary Tract: Redemonstration of a stable 8 x 10 mm left adrenal nodule, indeterminate but was radiotracer avid on the prior PET-CT scan. Unremarkable right adrenal gland. No suspicious  renal mass. There are stable several subcentimeter sized hypoattenuating foci in the right kidney lower pole. No hydronephrosis. No renal or ureteric calculi. Unremarkable urinary bladder. Stomach/Bowel: No disproportionate dilation of the small or large bowel loops. No evidence of abnormal bowel wall thickening or inflammatory changes. The appendix is unremarkable. There are multiple diverticula mainly in the sigmoid colon, without imaging signs of diverticulitis. Vascular/Lymphatic: No ascites or pneumoperitoneum. No abdominal or pelvic lymphadenopathy, by size criteria. No aneurysmal dilation of the major abdominal arteries. There are moderate peripheral atherosclerotic vascular calcifications of the aorta and its major branches. Reproductive: The uterus is surgically absent. No large adnexal mass. Other: Previously described mesenteric or retroperitoneal nodules are not clearly seen on today's exam. Please correlate with tumor markers. There is tiny fat containing umbilical hernia. Musculoskeletal: No suspicious osseous lesions. There are mild - moderate multilevel degenerative changes in the visualized spine. There is grade 1 anterolisthesis of L3 over L4 and L4 over L5. Review of the MIP images confirms the above findings. IMPRESSION: 1. No embolism to the proximal subsegmental pulmonary artery level. 2. No lung mass, consolidation, pleural effusion or pneumothorax. 3. No acute inflammatory process identified within the abdomen or pelvis. 4. Redemonstration of ill-defined hypoattenuating lesion in the right hepatic lobe, segment 6, which corresponds to radiotracer avid lesion on the prior PET-CT scan. Exact comparison with PET-CT scan is difficult due to difference in technique however, lesion appears grossly similar in size. 5. Stable 8 x 10 mm left adrenal nodule, indeterminate but was radiotracer avid on the prior PET-CT scan. 6. Previously described mesenteric or retroperitoneal nodules are not clearly  seen on today's exam. Please correlate with tumor markers. 7. Multiple other nonacute observations, as described above. Aortic Atherosclerosis (ICD10-I70.0). Electronically Signed   By: Ree Molt M.D.   On: 12/29/2023 09:32   CT ABDOMEN PELVIS W CONTRAST Result Date: 12/29/2023 CLINICAL DATA:  Pulmonary embolism (PE) suspected, high prob; Abdominal pain, acute, nonlocalized. Chest and abdominal pain, status post radiation therapy. Diarrhea. * Tracking Code: BO * EXAM: CT ANGIOGRAPHY CHEST CT ABDOMEN AND PELVIS WITH CONTRAST TECHNIQUE: Multidetector CT imaging of the chest was performed using the standard protocol during bolus administration of intravenous contrast. Multiplanar CT image reconstructions and MIPs were obtained to evaluate the vascular anatomy. Multidetector CT imaging of the abdomen and pelvis was performed using the standard protocol during bolus administration of intravenous contrast. RADIATION DOSE REDUCTION: This exam was performed according to the departmental dose-optimization program which includes automated exposure control, adjustment of the mA and/or kV according to patient size and/or use of iterative reconstruction technique. CONTRAST:  OMNIPAQUE  IOHEXOL  350 MG/ML SOLN COMPARISON:  Nuclear medicine PET scan from 10/07/2023 and CT scan chest, abdomen and pelvis from 08/25/2023. FINDINGS: CTA CHEST FINDINGS Cardiovascular: No evidence of embolism to the proximal subsegmental pulmonary artery level. Normal cardiac size. No pericardial effusion. No aortic aneurysm. There are coronary artery calcifications, in keeping with coronary artery disease. There are also mild peripheral atherosclerotic vascular calcifications of thoracic aorta and its major branches. Mediastinum/Nodes: Visualized thyroid  gland appears grossly unremarkable. No solid / cystic mediastinal masses. The esophagus is nondistended precluding optimal assessment.  No axillary, mediastinal or hilar lymphadenopathy by  size criteria. Lungs/Pleura: The central tracheo-bronchial tree is patent. There are dependent changes in bilateral lungs. No mass or consolidation. No pleural effusion or pneumothorax. No suspicious lung nodules. Musculoskeletal: The visualized soft tissues of the chest wall are grossly unremarkable. No suspicious osseous lesions. There are mild multilevel degenerative changes in the visualized spine. Review of the MIP images confirms the above findings. CT ABDOMEN and PELVIS FINDINGS Hepatobiliary: The liver is normal in size. Non-cirrhotic configuration. Redemonstration of ill-defined hypoattenuating subcapsular approximately 1.8 x 2.2 cm lesion in the right hepatic lobe, segment 6, which corresponds to radiotracer avid lesion on the prior PET-CT scan from 10/07/2023. Exact comparison with PET-CT scan is difficult due to difference in technique however, lesion appears grossly similar in size. No new liver lesions seen. No intrahepatic or extrahepatic bile duct dilation. No calcified gallstones. Normal gallbladder wall thickness. No pericholecystic inflammatory changes. Pancreas: Unremarkable. No pancreatic ductal dilatation or surrounding inflammatory changes. Spleen: Within normal limits. No focal lesion. Adrenals/Urinary Tract: Redemonstration of a stable 8 x 10 mm left adrenal nodule, indeterminate but was radiotracer avid on the prior PET-CT scan. Unremarkable right adrenal gland. No suspicious renal mass. There are stable several subcentimeter sized hypoattenuating foci in the right kidney lower pole. No hydronephrosis. No renal or ureteric calculi. Unremarkable urinary bladder. Stomach/Bowel: No disproportionate dilation of the small or large bowel loops. No evidence of abnormal bowel wall thickening or inflammatory changes. The appendix is unremarkable. There are multiple diverticula mainly in the sigmoid colon, without imaging signs of diverticulitis. Vascular/Lymphatic: No ascites or pneumoperitoneum. No  abdominal or pelvic lymphadenopathy, by size criteria. No aneurysmal dilation of the major abdominal arteries. There are moderate peripheral atherosclerotic vascular calcifications of the aorta and its major branches. Reproductive: The uterus is surgically absent. No large adnexal mass. Other: Previously described mesenteric or retroperitoneal nodules are not clearly seen on today's exam. Please correlate with tumor markers. There is tiny fat containing umbilical hernia. Musculoskeletal: No suspicious osseous lesions. There are mild - moderate multilevel degenerative changes in the visualized spine. There is grade 1 anterolisthesis of L3 over L4 and L4 over L5. Review of the MIP images confirms the above findings. IMPRESSION: 1. No embolism to the proximal subsegmental pulmonary artery level. 2. No lung mass, consolidation, pleural effusion or pneumothorax. 3. No acute inflammatory process identified within the abdomen or pelvis. 4. Redemonstration of ill-defined hypoattenuating lesion in the right hepatic lobe, segment 6, which corresponds to radiotracer avid lesion on the prior PET-CT scan. Exact comparison with PET-CT scan is difficult due to difference in technique however, lesion appears grossly similar in size. 5. Stable 8 x 10 mm left adrenal nodule, indeterminate but was radiotracer avid on the prior PET-CT scan. 6. Previously described mesenteric or retroperitoneal nodules are not clearly seen on today's exam. Please correlate with tumor markers. 7. Multiple other nonacute observations, as described above. Aortic Atherosclerosis (ICD10-I70.0). Electronically Signed   By: Ree Molt M.D.   On: 12/29/2023 09:32   DG Chest Portable 1 View Result Date: 12/29/2023 CLINICAL DATA:  68 year old female with chest and abdominal pain. Metastatic neuroendocrine tumor. EXAM: PORTABLE CHEST 1 VIEW COMPARISON:  Restaging CT Chest, Abdomen, and Pelvis 08/25/2023. Chest radiographs 04/18/2023. FINDINGS: Portable AP  semi upright view at 0657 hours. Stable tortuosity of the thoracic aorta with otherwise normal heart and mediastinal contours. Visualized tracheal air column is within normal limits. Lung volumes are stable and within normal limits. Allowing for portable  technique the lungs are clear. No pneumothorax or pleural effusion. Stable visualized osseous structures. Nonobstructed visible bowel gas pattern. IMPRESSION: No acute cardiopulmonary abnormality. Electronically Signed   By: VEAR Hurst M.D.   On: 12/29/2023 07:04   NM LUTATHERA  ADMINISTRATION Result Date: 12/27/2023 CLINICAL DATA:  68 year-old female with metastatic neuroendocrine tumor. Well differentiated tumor with somatostatin receptor is identified within the liver and associated small bowel lesions by DOTATATE PET CT scan. EXAM: NUCLEAR MEDICINE LUTATHERA  ADMINISTRATION TECHNIQUE: Infusion: The nuclear medicine technologist and I personally verified the dose activity (202 mCi) to be delivered as specified in the written directive (200 mCi), and verified the patient identification via 2 separate methods. 20 gauge IV were started in the antecubital veins. Anti-emetics were administered by nursing staff. Amino acid renal protection was initiated 30 minutes prior to Lu 177 DOTATATE (Lutathera ) infusion and continued continuously for 4 hours. Lutathera  infusion was administered over 30 minutes. The total administered dose was 202 mCi Lu 177 DOTATATE. The entire IV tubing, venocatheter, stopcock and syringes was removed in total, placed in a disposal bag and sent for assay of the residual activity, which will be reported at a later time in our EMR by the physics staff. Pressure was applied to the venipuncture sites, and a compression bandage placed. Radiation Safety personnel were present to perform the discharge survey, as detailed on their documentation. Sandostatin  injection deferred as patient received Sandostatin  on 12/08/2023. RADIOPHARMACEUTICALS:  Two  hundred two mCi Lu 177 DOTATATE FINDINGS: Diagnosis: Metastatic neuroendocrine tumor. Current Infusion: 1 Planned Infusions: 4 Patient and patient's friend were again informed of the risks and benefits procedure. All questions were answered. Consent forms signed. The patient's most recent blood counts were reviewed and remains a good candidate to proceed with Lutathera . The patient was situated in an infusion suite and administered Lutathera  as above. Patient will follow-up with referring oncologist for interval serum laboratories (CBC and CMP) in approximately 4 weeks. IMPRESSION: First Lu 177 DOTATATE treatment for metastatic neuroendocrine tumor. The patient tolerated the infusion well. The patient will return in 8 weeks for ongoing care. Electronically Signed   By: Jackquline Boxer M.D.   On: 12/27/2023 13:31     Assessment and Plan:   NSTEMI Reporting new complaints of chest pain with radiation into her left arm.  EKG with no acute ST-T wave changes.  Troponins gradually climbing and peaked at 1670.  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  Will plan for cardiac catheterization.  Of note she is a [employer] and declines any blood transfusion however is okay should she need DAPT therapy.  Declines any blood transfusion however is okay should she need DAPT therapy.  This is also still within the scope of her GOC.  Stable hemoglobin and platelets. Continue with IV heparin , give aspirin , no beta-blocker with borderline bradycardia, can continue rosuvastatin  20 mg for the time being, check lipid panel Echocardiogram pending  Informed Consent   Shared Decision Making/Informed Consent{  The risks [stroke (1 in 1000), death (1 in 1000), kidney failure [usually temporary] (1 in 500), bleeding (1 in 200), allergic reaction [possibly serious] (1 in 200)], benefits (diagnostic support and management of coronary artery disease) and alternatives of a  cardiac catheterization were discussed in detail with Ms. Economos and she is willing to proceed.  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  Seems to have progressive disease although no terminal diagnosis.  On Lutathera  in Sandostatin . Will discuss with MD to ensure that this has no interactions with DAPT or any other cardiac related medications.  OSA on CPAP  Type 2 diabetes A1c 7.5%.  May consider SGLT2 inhibitor.  She may resume her metformin  greater than 48 hours after cardiac catheterization.  [employer] No blood products.       Risk Assessment/Risk Scores:   TIMI Risk Score for Unstable Angina or Non-ST Elevation MI:   The patient's TIMI risk score is 5, which indicates a 26% risk of all cause mortality, new or recurrent myocardial infarction or need for urgent revascularization in the next 14 days.       For questions or updates, please contact [facility] Please consult [email or web address] for contact info under    Signed, Thom LITTIE Sluder, PA-C  12/30/2023 8:24 AM

## 2023-12-30 NOTE — Assessment & Plan Note (Signed)
 Continue Cymbalta.

## 2023-12-30 NOTE — Progress Notes (Signed)
  Progress Note   Patient: Tanya Walsh FMW:[id number] DOB: [birth year] DOA: 12/29/2023     1 DOS: the patient was seen and examined on 12/30/2023 at 10:38AM      Brief hospital course: 68 y.o. 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  In the ER, found to have troponin 1670.  Admitted on heparin .      Assessment and Plan: * ACS (acute coronary syndrome) (HCC) Admitted with waxing and waning CP radiating to arm without clear exertional component.  Troponin peaked at 1670.  Cardiology consulted, started on heparin  infusion.  LHC 4/18 showed 70% RCA stenosis, OMT recommended.   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 - Continue Octreotide   Obesity (BMI 30-39.9) Class 1, BMI 31, complicates care  Type 2 diabetes mellitus with hyperglycemia, without long-term current use of insulin  (HCC) Glucose controlled - Hold metformin  - Continue SS corrections  OSA (obstructive sleep apnea) - CPAP at night  MDD (major depressive disorder) - Continue Cymbalta   Essential hypertension Blood pressure controlled - Continue losartan , HCTZ          Subjective: Chest pain is resolved and has not come back.  She has no dyspnea, no confusion.  Planning for heart cath later this morning.     Physical Exam: BP 129/76 (BP Location: Left Arm)   Pulse 62   Temp 98.5 F (36.9 C) (Oral)   Resp 14   Ht 5' 3 (1.6 m)   Wt 80.5 kg   SpO2 91%   BMI 31.42 kg/m   Adult female, sitting up in bed, interactive and appropriate RRR, no murmurs, no peripheral edema Respiratory rate normal, lungs clear without rales  or wheezes Abdomen soft without tenderness palpation or guarding, no ascites or distention Attention normal, affect normal, judgment and insight appear normal   Data Reviewed: CT abdomen and pelvis shows no changes to her liver lesion or mesenteric nodules, rules out PE Comprehensive metabolic panel normal Phos and magnesium normal High-sensitivity troponin trending down from 1670 CBC normal Lactic acid 2.1, nonspecific    Family Communication: None present   Disposition: Status is: Inpatient        Author: Lonni SHAUNNA Dalton, MD 12/30/2023 2:05 PM  For on call review [email or web address].

## 2023-12-30 NOTE — Interval H&P Note (Signed)
 History and Physical Interval Note:  12/30/2023 12:38 PM  Tanya Walsh  has presented today for surgery, with the diagnosis of nstemi.  The various methods of treatment have been discussed with the patient and family. After consideration of risks, benefits and other options for treatment, the patient has consented to  Procedure(s): LEFT HEART CATH AND CORONARY ANGIOGRAPHY (N/A) as a surgical intervention.  The patient's history has been reviewed, patient examined, no change in status, stable for surgery.  I have reviewed the patient's chart and labs.  Questions were answered to the patient's satisfaction.   Cath Lab Visit (complete for each Cath Lab visit)  Clinical Evaluation Leading to the Procedure:   ACS: Yes.    Non-ACS:    Anginal Classification: CCS III  Anti-ischemic medical therapy: No Therapy  Non-Invasive Test Results: No non-invasive testing performed  Prior CABG: No previous CABG        Tanya Walsh [facility] 12/30/2023 12:38 PM

## 2023-12-31 ENCOUNTER — Encounter: Payer: Self-pay | Admitting: Hematology

## 2023-12-31 ENCOUNTER — Other Ambulatory Visit (HOSPITAL_COMMUNITY): Payer: Self-pay

## 2023-12-31 DIAGNOSIS — I249 Acute ischemic heart disease, unspecified: Secondary | ICD-10-CM | POA: Diagnosis not present

## 2023-12-31 LAB — CBC
HCT: 37.4 % (ref 36.0–46.0)
Hemoglobin: 12.2 g/dL (ref 12.0–15.0)
MCH: 28.8 pg (ref 26.0–34.0)
MCHC: 32.6 g/dL (ref 30.0–36.0)
MCV: 88.4 fL (ref 80.0–100.0)
Platelets: 270 10*3/uL (ref 150–400)
RBC: 4.23 MIL/uL (ref 3.87–5.11)
RDW: 13.1 % (ref 11.5–15.5)
WBC: 5.9 10*3/uL (ref 4.0–10.5)
nRBC: 0 % (ref 0.0–0.2)

## 2023-12-31 LAB — GLUCOSE, CAPILLARY: Glucose-Capillary: 122 mg/dL — ABNORMAL HIGH (ref 70–99)

## 2023-12-31 LAB — LIPID PANEL
Cholesterol: 117 mg/dL (ref 0–200)
HDL: 53 mg/dL (ref >40)
LDL Cholesterol: 52 mg/dL (ref 0–99)
Total CHOL/HDL Ratio: 2.2 ratio
Triglycerides: 61 mg/dL (ref <150)
VLDL: 12 mg/dL (ref 0–40)

## 2023-12-31 LAB — BASIC METABOLIC PANEL WITH GFR
Anion gap: 12 (ref 5–15)
BUN: 7 mg/dL — ABNORMAL LOW (ref 8–23)
CO2: 24 mmol/L (ref 22–32)
Calcium: 8.7 mg/dL — ABNORMAL LOW (ref 8.9–10.3)
Chloride: 101 mmol/L (ref 98–111)
Creatinine, Ser: 0.78 mg/dL (ref 0.44–1.00)
GFR, Estimated: >60 mL/min (ref >60)
Glucose, Bld: 134 mg/dL — ABNORMAL HIGH (ref 70–99)
Potassium: 3.5 mmol/L (ref 3.5–5.1)
Sodium: 137 mmol/L (ref 135–145)

## 2023-12-31 MED ORDER — ASPIRIN 81 MG PO TBEC
81.0000 mg | DELAYED_RELEASE_TABLET | Freq: Every day | ORAL | 0 refills | Status: DC
  Filled 2023-12-31: qty 90, 90d supply, fill #0

## 2023-12-31 MED ORDER — CLOPIDOGREL BISULFATE 75 MG PO TABS
75.0000 mg | ORAL_TABLET | Freq: Every day | ORAL | 0 refills | Status: DC
  Filled 2023-12-31: qty 90, 90d supply, fill #0

## 2023-12-31 MED ORDER — ISOSORBIDE MONONITRATE ER 30 MG PO TB24
30.0000 mg | ORAL_TABLET | Freq: Every day | ORAL | 0 refills | Status: DC
  Filled 2023-12-31: qty 90, 90d supply, fill #0

## 2023-12-31 NOTE — Discharge Summary (Signed)
 Physician Discharge Summary   Patient: Tanya Walsh MRN: [id number] DOB: [birth year]  Admit date:     12/29/2023  Discharge date: 12/31/23  Discharge Physician: Ephriam Hashimoto   PCP: Claudell Cruz, MD     Recommendations at discharge:  Follow up with Cardiology Charles Connor in 2 weeks for NSTEMI Follow up with PCP Maximo Spar for coronary disease Follow up with Dr. 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  In the ER, found to have troponin 1670.  Admitted on heparin .       * ACS (acute coronary syndrome) (HCC) Admitted with waxing and waning CP radiating to arm without clear exertional component.  Troponin peaked at 1670.  Cardiology consulted, started on heparin  infusion.  LHC 4/18 showed 70% RCA stenosis, OMT recommended.    Started on aspirin  and Plavix  and Imdur .  Continue on Crestor .  Echo obtained, showed normal EF.  Cardiac rehab ordered.                 The [place]  Controlled Substances Registry was reviewed for this patient prior to discharge.   Consultants: Cardiology  Procedures performed: Echocardiogram Left Heart Catheterization   Disposition: Home Diet recommendation:  Discharge Diet Orders (From admission, onward)     Start     Ordered   12/31/23 0000  Diet - low sodium heart healthy        12/31/23 1029             DISCHARGE  MEDICATION: Allergies as of 12/31/2023       Reactions   Bactrim [sulfamethoxazole-trimethoprim] Swelling        Medication List     STOP taking these medications    ibuprofen  200 MG tablet Commonly known as: ADVIL        TAKE these medications    acetaminophen  650 MG CR tablet Commonly known as: TYLENOL  Take 650 mg by mouth every 8 (eight) hours as needed for pain.   aspirin  EC 81 MG tablet Take 1 tablet (81 mg total) by mouth daily. Swallow whole. Start taking on: January 01, 2024   clopidogrel  75 MG tablet Commonly known as: PLAVIX  Take 1 tablet (75 mg total) by mouth daily. Start taking on: January 01, 2024   Cyanocobalamin  1000 MCG Lozg Take 1,000 mcg by mouth in the morning.   cyclobenzaprine  5 MG tablet Commonly known as: FLEXERIL  Take 1 tablet (5 mg total) by mouth 3 (three) times daily as needed for muscle spasms.   DULoxetine  60 MG capsule Commonly known as: CYMBALTA  Take 60 mg by mouth in the morning.   fluticasone 50 MCG/ACT nasal spray Commonly known as: FLONASE Place 1 spray into both nostrils as needed for allergies or rhinitis.   folic acid  800 MCG tablet Commonly known as: FOLVITE  Take 400 mcg by mouth in the morning.   gabapentin  100 MG capsule Commonly known as: Neurontin  Take 1 capsule (100 mg  total) by mouth at bedtime.   isosorbide  mononitrate 30 MG 24 hr tablet Commonly known as: IMDUR  Take 1 tablet (30 mg total) by mouth daily. Start taking on: January 01, 2024   losartan -hydrochlorothiazide  50-12.5 MG tablet Commonly known as: HYZAAR Take 1 tablet by mouth in the morning.   metFORMIN  500 MG 24 hr tablet Commonly known as: GLUCOPHAGE -XR Take 500 mg by mouth daily with breakfast. Notes to patient: DO NOT TAKE prior to 01/02/2024 (at least 48hrs after your heart catheterization) to avoid potential kidney damage.   Multivitamin Adults 50+ Tabs Take 1 tablet by mouth daily.   nitroGLYCERIN  0.4 MG SL tablet Commonly known as:  NITROSTAT  Place 1 tablet (0.4 mg total) under the tongue every 5 (five) minutes as needed for chest pain.   ondansetron  8 MG tablet Commonly known as: ZOFRAN  Take 1 tablet (8 mg total) by mouth 2 (two) times daily as needed for nausea or vomiting.   pantoprazole  40 MG tablet Commonly known as: PROTONIX  Take 40 mg by mouth in the morning.   rosuvastatin  20 MG tablet Commonly known as: CRESTOR  Take 20 mg by mouth in the morning.   traMADol  50 MG tablet Commonly known as: ULTRAM  Take 1 tablet (50 mg total) by mouth every 12 (twelve) hours as needed.   trolamine salicylate 10 % cream Commonly known as: ASPERCREME Apply 1 application topically as needed for muscle pain.        Follow-up Information     Claudell Cruz, MD. Schedule an appointment as soon as possible for a visit in 1 week(s).   Specialty: Family Medicine Contact information: [address] [place] [address] [phone]                 Discharge Instructions     Diet - low sodium heart healthy   Complete by: As directed    Discharge instructions   Complete by: As directed    **IMPORTANT DISCHARGE INSTRUCTIONS**   From Dr. Darlyn Eke: You were admitted for chest pain  Here, we found that you had a small heart attack  You underwent a left heart cath, which showed partial blockage of one of the arteries of your heart.  The Cardiologists recommended medical treatment for this, rather than stenting.  START low dose aspirin  81 mg from now on  START clopidogrel /Plavix  75 mg daily  Both of these (aspirin  and Plavix ) are designed to prevent platelets from clumping and causing heart artery blockages   Platelets are the cells in your blood that start clots, and in arteries, they cause heart attacks, so aspirin  and Plavix  help prevent this.  Your Crestor  is working to control your cholesterol, keep it up!  Lastly, to prevent more chest pain, we are going to start with  isosorbide  mononitrate/Imdur   This helps keep the arteries of the heart calm and open, to prevent spasms of pain when there is a blockage like you have  Go see your primary doctor in 1 week  You have an appointment with Dr. Maryalice Smaller next week as well  And the Cardiology office have set you up with an appointment at their [facility] office on 5/9 with Charles Connor (see below in the To Do section for details and phone #)  Continue all your normal home medicines without change  Important: avoid NSAIDs like ibuprofen , Motrin , Advil  or Aleve , as these are associated with increased risk fo heart attack   Increase activity slowly   Complete by: As  directed        Discharge Exam: Filed Weights   12/29/23 2250  Weight: 80.5 kg    General: Pt is alert, awake, not in acute distress Cardiovascular: RRR, nl S1-S2, no murmurs appreciated.   No LE edema.   Respiratory: Normal respiratory rate and rhythm.  CTAB without rales or wheezes. Abdominal: Abdomen soft and non-tender.  No distension or HSM.   Neuro/Psych: Strength symmetric in upper and lower extremities.  Judgment and insight appear normal.   Condition at discharge: good  The results of significant diagnostics from this hospitalization (including imaging, microbiology, ancillary and laboratory) are listed below for reference.   Imaging Studies: ECHOCARDIOGRAM COMPLETE Result Date: 12/30/2023    ECHOCARDIOGRAM REPORT   Patient Name:   Tanya Walsh Date of Exam: 12/30/2023 Medical Rec #:  [id number]         Height:       63.0 in Accession #:    [id number]        Weight:       177.4 lb Date of Birth:  20-Jan-1956         BSA:          1.838 m Patient Age:    68 years          BP:           147/72 mmHg Patient Gender: F                 HR:           62 bpm. Exam Location:  Inpatient Procedure: 2D Echo, 3D Echo, Cardiac Doppler, Color Doppler and Strain Analysis            (Both Spectral and Color Flow Doppler were utilized during             procedure). Indications:    Acute ischemic heart disease  History:        Patient has prior history of Echocardiogram examinations, most                 recent 07/26/2024. CAD; Risk Factors:Hypertension, Dyslipidemia                 and Sleep Apnea.  Sonographer:    Reta Cassis Referring Phys: [id number] DAVID MANUEL ORTIZ  Sonographer Comments: Global longitudinal strain was attempted. IMPRESSIONS  1. Left ventricular ejection fraction, by estimation, is 60 to 65%. The left ventricle has normal function. The left ventricle has no regional wall motion abnormalities. There is mild left ventricular hypertrophy. Left ventricular diastolic parameters were normal.  2. Right ventricular systolic function is normal. The right ventricular size is normal. There is normal pulmonary artery systolic pressure.  3. Trivial mitral valve regurgitation.  4. The aortic valve is tricuspid. Aortic valve regurgitation is not visualized. Aortic valve sclerosis/calcification is present, without any evidence of aortic stenosis. FINDINGS  Left Ventricle: Left ventricular ejection fraction, by estimation, is 60 to 65%. The left ventricle has normal function. The left ventricle has no regional wall motion abnormalities. The global longitudinal strain is normal despite suboptimal segment tracking. The left ventricular internal cavity size was normal in size. There is mild left ventricular hypertrophy. Left ventricular diastolic parameters were normal. Right Ventricle: The right ventricular size is normal. Right vetricular wall thickness was not assessed. Right ventricular systolic function is normal. There is normal pulmonary artery systolic pressure. The tricuspid regurgitant velocity is 1.68 m/s, and with an assumed right atrial pressure of 3 mmHg,  the estimated right ventricular systolic pressure is 14.3 mmHg. Left Atrium: Left atrial size was normal in size. Right Atrium: Right atrial size was normal in size. Pericardium: There is no evidence  of pericardial effusion. Mitral Valve: There is mild thickening of the mitral valve leaflet(s). Trivial mitral valve regurgitation. Tricuspid Valve: The tricuspid valve is normal in structure. Tricuspid valve regurgitation is trivial. Aortic Valve: The aortic valve is tricuspid. Aortic valve regurgitation is not visualized. Aortic valve sclerosis/calcification is present, without any evidence of aortic stenosis. Aortic valve mean gradient measures 4.0 mmHg. Aortic valve peak gradient measures 8.0 mmHg. Aortic valve area, by VTI measures 3.00 cm. Pulmonic Valve: The pulmonic valve was not well visualized. Pulmonic valve regurgitation is not visualized. No evidence of pulmonic stenosis. Aorta: The aortic root and ascending aorta are structurally normal, with no evidence of dilitation. IAS/Shunts: No atrial level shunt detected by color flow Doppler.  LEFT VENTRICLE PLAX 2D LVIDd:         3.60 cm   Diastology LVIDs:         2.85 cm   LV e' medial:    6.20 cm/s LV PW:         1.00 cm   LV E/e' medial:  10.5 LV IVS:        1.20 cm   LV e' lateral:   13.80 cm/s LVOT diam:     2.30 cm   LV E/e' lateral: 4.7 LV SV:         96 LV SV Index:   52 LVOT Area:     4.15 cm                           3D Volume EF:                          3D EF:        54 %                          LV EDV:       94 ml                          LV ESV:       43 ml                          LV SV:        51 ml RIGHT VENTRICLE             IVC RV Basal diam:  2.70 cm     IVC diam: 1.40 cm RV S prime:     15.10 cm/s TAPSE (M-mode): 2.4 cm LEFT ATRIUM             Index        RIGHT ATRIUM           Index LA diam:        3.70 cm 2.01 cm/m   RA Area:     12.90 cm LA Vol (A2C):   42.7 ml 23.24 ml/m  RA Volume:   30.40 ml  16.54 ml/m LA Vol (A4C):   39.1 ml 21.28 ml/m LA Biplane Vol: 42.2 ml 22.97 ml/m  AORTIC VALVE AV Area (Vmax):    2.88 cm AV Area (Vmean):   2.84 cm AV Area (  VTI):     3.00 cm AV Vmax:           141.00 cm/s AV Vmean:           94.000 cm/s AV VTI:            0.319 m AV Peak Grad:      8.0 mmHg AV Mean Grad:      4.0 mmHg LVOT Vmax:         97.80 cm/s LVOT Vmean:        64.200 cm/s LVOT VTI:          0.230 m LVOT/AV VTI ratio: 0.72  AORTA Ao Root diam: 3.80 cm Ao Asc diam:  2.50 cm MITRAL VALVE               TRICUSPID VALVE MV Area (PHT): 2.83 cm    TR Peak grad:   11.3 mmHg MV Decel Time: 268 msec    TR Vmax:        168.00 cm/s MV E velocity: 64.90 cm/s MV A velocity: 79.90 cm/s  SHUNTS MV E/A ratio:  0.81        Systemic VTI:  0.23 m                            Systemic Diam: 2.30 cm Ola Berger MD Electronically signed by Ola Berger MD Signature Date/Time: 12/30/2023/2:13:09 PM    Final    CARDIAC CATHETERIZATION Result Date: 12/30/2023   Estella Helling Cx lesion is 50% stenosed.   Dist LM to Ost LAD lesion is 30% stenosed.   Prox RCA lesion is 70% stenosed.   LV end diastolic pressure is normal. Moderate single vessel CAD with segmental stenosis in the proximal RCA Normal LVEDP Plan: given history and atypical presentation I would recommend medical management. If she were to have refractory angina despite optimal medical therapy could consider PCI of the RCA   CT Angio Chest PE W/Cm &/Or Wo Cm Result Date: 12/29/2023 CLINICAL DATA:  Pulmonary embolism (PE) suspected, high prob; Abdominal pain, acute, nonlocalized. Chest and abdominal pain, status post radiation therapy. Diarrhea. * Tracking Code: BO * EXAM: CT ANGIOGRAPHY CHEST CT ABDOMEN AND PELVIS WITH CONTRAST TECHNIQUE: Multidetector CT imaging of the chest was performed using the standard protocol during bolus administration of intravenous contrast. Multiplanar CT image reconstructions and MIPs were obtained to evaluate the vascular anatomy. Multidetector CT imaging of the abdomen and pelvis was performed using the standard protocol during bolus administration of intravenous contrast. RADIATION DOSE REDUCTION: This exam was performed according to the departmental dose-optimization program  which includes automated exposure control, adjustment of the mA and/or kV according to patient size and/or use of iterative reconstruction technique. CONTRAST:  OMNIPAQUE  IOHEXOL  350 MG/ML SOLN COMPARISON:  Nuclear medicine PET scan from 10/07/2023 and CT scan chest, abdomen and pelvis from 08/25/2023. FINDINGS: CTA CHEST FINDINGS Cardiovascular: No evidence of embolism to the proximal subsegmental pulmonary artery level. Normal cardiac size. No pericardial effusion. No aortic aneurysm. There are coronary artery calcifications, in keeping with coronary artery disease. There are also mild peripheral atherosclerotic vascular calcifications of thoracic aorta and its major branches. Mediastinum/Nodes: Visualized thyroid  gland appears grossly unremarkable. No solid / cystic mediastinal masses. The esophagus is nondistended precluding optimal assessment. No axillary, mediastinal or hilar lymphadenopathy by size criteria. Lungs/Pleura: The central tracheo-bronchial tree is patent. There are dependent changes in bilateral lungs. No mass or consolidation. No pleural effusion or  pneumothorax. No suspicious lung nodules. Musculoskeletal: The visualized soft tissues of the chest wall are grossly unremarkable. No suspicious osseous lesions. There are mild multilevel degenerative changes in the visualized spine. Review of the MIP images confirms the above findings. CT ABDOMEN and PELVIS FINDINGS Hepatobiliary: The liver is normal in size. Non-cirrhotic configuration. Redemonstration of ill-defined hypoattenuating subcapsular approximately 1.8 x 2.2 cm lesion in the right hepatic lobe, segment 6, which corresponds to radiotracer avid lesion on the prior PET-CT scan from 10/07/2023. Exact comparison with PET-CT scan is difficult due to difference in technique however, lesion appears grossly similar in size. No new liver lesions seen. No intrahepatic or extrahepatic bile duct dilation. No calcified gallstones. Normal  gallbladder wall thickness. No pericholecystic inflammatory changes. Pancreas: Unremarkable. No pancreatic ductal dilatation or surrounding inflammatory changes. Spleen: Within normal limits. No focal lesion. Adrenals/Urinary Tract: Redemonstration of a stable 8 x 10 mm left adrenal nodule, indeterminate but was radiotracer avid on the prior PET-CT scan. Unremarkable right adrenal gland. No suspicious renal mass. There are stable several subcentimeter sized hypoattenuating foci in the right kidney lower pole. No hydronephrosis. No renal or ureteric calculi. Unremarkable urinary bladder. Stomach/Bowel: No disproportionate dilation of the small or large bowel loops. No evidence of abnormal bowel wall thickening or inflammatory changes. The appendix is unremarkable. There are multiple diverticula mainly in the sigmoid colon, without imaging signs of diverticulitis. Vascular/Lymphatic: No ascites or pneumoperitoneum. No abdominal or pelvic lymphadenopathy, by size criteria. No aneurysmal dilation of the major abdominal arteries. There are moderate peripheral atherosclerotic vascular calcifications of the aorta and its major branches. Reproductive: The uterus is surgically absent. No large adnexal mass. Other: Previously described mesenteric or retroperitoneal nodules are not clearly seen on today's exam. Please correlate with tumor markers. There is tiny fat containing umbilical hernia. Musculoskeletal: No suspicious osseous lesions. There are mild - moderate multilevel degenerative changes in the visualized spine. There is grade 1 anterolisthesis of L3 over L4 and L4 over L5. Review of the MIP images confirms the above findings. IMPRESSION: 1. No embolism to the proximal subsegmental pulmonary artery level. 2. No lung mass, consolidation, pleural effusion or pneumothorax. 3. No acute inflammatory process identified within the abdomen or pelvis. 4. Redemonstration of ill-defined hypoattenuating lesion in the right  hepatic lobe, segment 6, which corresponds to radiotracer avid lesion on the prior PET-CT scan. Exact comparison with PET-CT scan is difficult due to difference in technique however, lesion appears grossly similar in size. 5. Stable 8 x 10 mm left adrenal nodule, indeterminate but was radiotracer avid on the prior PET-CT scan. 6. Previously described mesenteric or retroperitoneal nodules are not clearly seen on today's exam. Please correlate with tumor markers. 7. Multiple other nonacute observations, as described above. Aortic Atherosclerosis (ICD10-I70.0). Electronically Signed   By: Beula Brunswick M.D.   On: 12/29/2023 09:32   CT ABDOMEN PELVIS W CONTRAST Result Date: 12/29/2023 CLINICAL DATA:  Pulmonary embolism (PE) suspected, high prob; Abdominal pain, acute, nonlocalized. Chest and abdominal pain, status post radiation therapy. Diarrhea. * Tracking Code: BO * EXAM: CT ANGIOGRAPHY CHEST CT ABDOMEN AND PELVIS WITH CONTRAST TECHNIQUE: Multidetector CT imaging of the chest was performed using the standard protocol during bolus administration of intravenous contrast. Multiplanar CT image reconstructions and MIPs were obtained to evaluate the vascular anatomy. Multidetector CT imaging of the abdomen and pelvis was performed using the standard protocol during bolus administration of intravenous contrast. RADIATION DOSE REDUCTION: This exam was performed according to the departmental dose-optimization program which  includes automated exposure control, adjustment of the mA and/or kV according to patient size and/or use of iterative reconstruction technique. CONTRAST:  OMNIPAQUE  IOHEXOL  350 MG/ML SOLN COMPARISON:  Nuclear medicine PET scan from 10/07/2023 and CT scan chest, abdomen and pelvis from 08/25/2023. FINDINGS: CTA CHEST FINDINGS Cardiovascular: No evidence of embolism to the proximal subsegmental pulmonary artery level. Normal cardiac size. No pericardial effusion. No aortic aneurysm. There are  coronary artery calcifications, in keeping with coronary artery disease. There are also mild peripheral atherosclerotic vascular calcifications of thoracic aorta and its major branches. Mediastinum/Nodes: Visualized thyroid  gland appears grossly unremarkable. No solid / cystic mediastinal masses. The esophagus is nondistended precluding optimal assessment. No axillary, mediastinal or hilar lymphadenopathy by size criteria. Lungs/Pleura: The central tracheo-bronchial tree is patent. There are dependent changes in bilateral lungs. No mass or consolidation. No pleural effusion or pneumothorax. No suspicious lung nodules. Musculoskeletal: The visualized soft tissues of the chest wall are grossly unremarkable. No suspicious osseous lesions. There are mild multilevel degenerative changes in the visualized spine. Review of the MIP images confirms the above findings. CT ABDOMEN and PELVIS FINDINGS Hepatobiliary: The liver is normal in size. Non-cirrhotic configuration. Redemonstration of ill-defined hypoattenuating subcapsular approximately 1.8 x 2.2 cm lesion in the right hepatic lobe, segment 6, which corresponds to radiotracer avid lesion on the prior PET-CT scan from 10/07/2023. Exact comparison with PET-CT scan is difficult due to difference in technique however, lesion appears grossly similar in size. No new liver lesions seen. No intrahepatic or extrahepatic bile duct dilation. No calcified gallstones. Normal gallbladder wall thickness. No pericholecystic inflammatory changes. Pancreas: Unremarkable. No pancreatic ductal dilatation or surrounding inflammatory changes. Spleen: Within normal limits. No focal lesion. Adrenals/Urinary Tract: Redemonstration of a stable 8 x 10 mm left adrenal nodule, indeterminate but was radiotracer avid on the prior PET-CT scan. Unremarkable right adrenal gland. No suspicious renal mass. There are stable several subcentimeter sized hypoattenuating foci in the right kidney lower pole. No  hydronephrosis. No renal or ureteric calculi. Unremarkable urinary bladder. Stomach/Bowel: No disproportionate dilation of the small or large bowel loops. No evidence of abnormal bowel wall thickening or inflammatory changes. The appendix is unremarkable. There are multiple diverticula mainly in the sigmoid colon, without imaging signs of diverticulitis. Vascular/Lymphatic: No ascites or pneumoperitoneum. No abdominal or pelvic lymphadenopathy, by size criteria. No aneurysmal dilation of the major abdominal arteries. There are moderate peripheral atherosclerotic vascular calcifications of the aorta and its major branches. Reproductive: The uterus is surgically absent. No large adnexal mass. Other: Previously described mesenteric or retroperitoneal nodules are not clearly seen on today's exam. Please correlate with tumor markers. There is tiny fat containing umbilical hernia. Musculoskeletal: No suspicious osseous lesions. There are mild - moderate multilevel degenerative changes in the visualized spine. There is grade 1 anterolisthesis of L3 over L4 and L4 over L5. Review of the MIP images confirms the above findings. IMPRESSION: 1. No embolism to the proximal subsegmental pulmonary artery level. 2. No lung mass, consolidation, pleural effusion or pneumothorax. 3. No acute inflammatory process identified within the abdomen or pelvis. 4. Redemonstration of ill-defined hypoattenuating lesion in the right hepatic lobe, segment 6, which corresponds to radiotracer avid lesion on the prior PET-CT scan. Exact comparison with PET-CT scan is difficult due to difference in technique however, lesion appears grossly similar in size. 5. Stable 8 x 10 mm left adrenal nodule, indeterminate but was radiotracer avid on the prior PET-CT scan. 6. Previously described mesenteric or retroperitoneal nodules are not clearly  seen on today's exam. Please correlate with tumor markers. 7. Multiple other nonacute observations, as described  above. Aortic Atherosclerosis (ICD10-I70.0). Electronically Signed   By: Beula Brunswick M.D.   On: 12/29/2023 09:32   DG Chest Portable 1 View Result Date: 12/29/2023 CLINICAL DATA:  68 year old female with chest and abdominal pain. Metastatic neuroendocrine tumor. EXAM: PORTABLE CHEST 1 VIEW COMPARISON:  Restaging CT Chest, Abdomen, and Pelvis 08/25/2023. Chest radiographs 04/18/2023. FINDINGS: Portable AP semi upright view at 0657 hours. Stable tortuosity of the thoracic aorta with otherwise normal heart and mediastinal contours. Visualized tracheal air column is within normal limits. Lung volumes are stable and within normal limits. Allowing for portable technique the lungs are clear. No pneumothorax or pleural effusion. Stable visualized osseous structures. Nonobstructed visible bowel gas pattern. IMPRESSION: No acute cardiopulmonary abnormality. Electronically Signed   By: Marlise Simpers M.D.   On: 12/29/2023 07:04   NM LUTATHERA  ADMINISTRATION Result Date: 12/27/2023 CLINICAL DATA:  68 year-old female with metastatic neuroendocrine tumor. Well differentiated tumor with somatostatin receptor is identified within the liver and associated small bowel lesions by DOTATATE PET CT scan. EXAM: NUCLEAR MEDICINE LUTATHERA  ADMINISTRATION TECHNIQUE: Infusion: The nuclear medicine technologist and I personally verified the dose activity (202 mCi) to be delivered as specified in the written directive (200 mCi), and verified the patient identification via 2 separate methods. 20 gauge IV were started in the antecubital veins. Anti-emetics were administered by nursing staff. Amino acid renal protection was initiated 30 minutes prior to Lu 177 DOTATATE (Lutathera ) infusion and continued continuously for 4 hours. Lutathera  infusion was administered over 30 minutes. The total administered dose was 202 mCi Lu 177 DOTATATE. The entire IV tubing, venocatheter, stopcock and syringes was removed in total, placed in a disposal bag  and sent for assay of the residual activity, which will be reported at a later time in our EMR by the physics staff. Pressure was applied to the venipuncture sites, and a compression bandage placed. Radiation Safety personnel were present to perform the discharge survey, as detailed on their documentation. Sandostatin  injection deferred as patient received Sandostatin  on 12/08/2023. RADIOPHARMACEUTICALS:  Two hundred two mCi Lu 177 DOTATATE FINDINGS: Diagnosis: Metastatic neuroendocrine tumor. Current Infusion: 1 Planned Infusions: 4 Patient and patient's friend were again informed of the risks and benefits procedure. All questions were answered. Consent forms signed. The patient's most recent blood counts were reviewed and remains a good candidate to proceed with Lutathera . The patient was situated in an infusion suite and administered Lutathera  as above. Patient will follow-up with referring oncologist for interval serum laboratories (CBC and CMP) in approximately 4 weeks. IMPRESSION: First Lu 177 DOTATATE treatment for metastatic neuroendocrine tumor. The patient tolerated the infusion well. The patient will return in 8 weeks for ongoing care. Electronically Signed   By: Deboraha Fallow M.D.   On: 12/27/2023 13:31   NCV with EMG(electromyography) Result Date: 12/23/2023 Daryel Ensign, DO     12/23/2023 11:13 AM [facility] [address] [place], [address]  [place], [place] [address] Tel: [phone] Fax: [phone] Test Date:  12/23/2023 Patient: Tanya Walsh DOB: 10-04-1955 Physician: Reyna Cava, DO Sex: Female Height: 5\' 3"  Ref Phys: Janne Members, DO ID#: [id number]   Technician:  History: This is a 68 year old female referred for evaluation of bilateral upper extremity paresthesias. NCV & EMG Findings: Extensive electrodiagnostic testing of the right upper extremity and additional studies of the left shows: Bilateral median, ulnar, and mixed palmar sensory responses are within normal  limits.  Bilateral median and ulnar motor responses are within normal limits. There is no evidence of active or chronic motor axonal loss changes affecting any of the tested muscles.  Motor unit configuration and recruitment pattern is within normal limits. Impression: This is a normal study of the upper extremities.  In particular, there is no evidence of carpal tunnel syndrome or a cervical radiculopathy. ___________________________ Reyna Cava, DO Nerve Conduction Studies  Stim Site NR Peak (ms) Norm Peak (ms) O-P Amp (V) Norm O-P Amp Left Median Anti Sensory (2nd Digit)  32 C Wrist    3.4 <3.8 26.5 >10 Right Median Anti Sensory (2nd Digit)  32 C Wrist    3.4 <3.8 20.8 >10 Left Ulnar Anti Sensory (5th Digit)  32 C Wrist    3.2 <3.2 23.6 >5 Right Ulnar Anti Sensory (5th Digit)  32 C Wrist    3.0 <3.2 20.0 >5  Stim Site NR Onset (ms) Norm Onset (ms) O-P Amp (mV) Norm O-P Amp Site1 Site2 Delta-0 (ms) Dist (cm) Vel (m/s) Norm Vel (m/s) Left Median Motor (Abd Poll Brev)  32 C Wrist    3.1 <4.0 11.6 >5 Elbow Wrist 5.4 30.0 56 >50 Elbow    8.5  11.0        Right Median Motor (Abd Poll Brev)  32 C Wrist    2.9 <4.0 13.4 >5 Elbow Wrist 5.6 30.0 54 >50 Elbow    8.5  12.4        Left Ulnar Motor (Abd Dig Minimi)  32 C Wrist    2.5 <3.1 10.3 >7 B Elbow Wrist 3.8 24.0 63 >50 B Elbow    6.3  9.9  A Elbow B Elbow 1.7 10.0 59 >50 A Elbow    8.0  9.5        Right Ulnar Motor (Abd Dig Minimi)  32 C Wrist    2.3 <3.1 9.7 >7 B Elbow Wrist 3.6 24.0 67 >50 B Elbow    5.9  9.6  A Elbow B Elbow 1.6 10.0 63 >50 A Elbow    7.5  9.2         Stim Site NR Peak (ms) Norm Peak (ms) P-T Amp (V) Site1 Site2 Delta-P (ms) Norm Delta (ms) Left Median/Ulnar Palm Comparison (Wrist - 8cm)  32 C Median Palm    1.6 <2.2 37.1 Median Palm Ulnar Palm 0.2  Ulnar Palm    1.8 <2.2 19.9     Right Median/Ulnar Palm Comparison (Wrist - 8cm)  32 C Median Palm    1.8 <2.2 31.7 Median Palm Ulnar Palm 0.1  Ulnar Palm    1.7 <2.2 8.2     Electromyography  Side  Muscle Ins.Act Fibs Fasc Recrt Amp Dur Poly Activation Comment Right 1stDorInt Nml Nml Nml Nml Nml Nml Nml Nml N/A Right PronatorTeres Nml Nml Nml Nml Nml Nml Nml Nml N/A Right Biceps Nml Nml Nml Nml Nml Nml Nml Nml N/A Right Triceps Nml Nml Nml Nml Nml Nml Nml Nml N/A Right Deltoid Nml Nml Nml Nml Nml Nml Nml Nml N/A Left 1stDorInt Nml Nml Nml Nml Nml Nml Nml Nml N/A Left PronatorTeres Nml Nml Nml Nml Nml Nml Nml Nml N/A Left Biceps Nml Nml Nml Nml Nml Nml Nml Nml N/A Left Triceps Nml Nml Nml Nml Nml Nml Nml Nml N/A Left Deltoid Nml Nml Nml Nml Nml Nml Nml Nml N/A Waveforms:  NM Radiologist Eval And Mgmt Result Date: 12/22/2023 EXAM: NEW PATIENT OFFICE VISIT CHIEF COMPLAINT: Well differentiated neuroendocrine tumor HISTORY OF PRESENT ILLNESS: Well differentiated neuroendocrine tumor diagnosed 2022. Patient underwent upper endoscopy for abdominal pain. Gastric polyp was biopsied and revealed well differentiated neuroendocrine tumor. Subsequent DOTATATE PET (February 16, 2021) scan revealed multiple foci metastatic disease associated the small bowel. Liver metastasis noted. Patient has been on Sandostatin  monthly injections. Chromogranin A  relatively stable at 30-40. Recently patient has experienced fatigue. Follow-up DOTATATE PET scan 10/07/2023 reveal progression well differentiated neuroendocrine tumor within liver and bowel. Patient presents for evaluation for LU  177 DOTATATE therapy. REVIEW OF SYSTEMS: See epic note PHYSICAL EXAMINATION: See epic note ASSESSMENT AND PLAN: See epic Electronically Signed   By: Deboraha Fallow M.D.   On: 12/22/2023 13:20    Microbiology: Results for orders placed or performed during the hospital encounter of 04/09/23  SARS CORONAVIRUS 2 (TAT 6-24 HRS) Anterior Nasal Swab     Status: Abnormal   Collection Time: 04/09/23  5:12 PM   Specimen: Anterior Nasal Swab  Result Value Ref Range Status   SARS Coronavirus 2 POSITIVE (A) NEGATIVE Final    Comment:  (NOTE) SARS-CoV-2 target nucleic acids are DETECTED.  The SARS-CoV-2 RNA is generally detectable in upper and lower respiratory specimens during the acute phase of infection. Positive results are indicative of the presence of SARS-CoV-2 RNA. Clinical correlation with patient history and other diagnostic information is  necessary to determine patient infection status. Positive results do not rule out bacterial infection or co-infection with other viruses.  The expected result is Negative.  Fact Sheet for Patients: HairSlick.no  Fact Sheet for Healthcare Providers: quierodirigir.com  This test is not yet approved or cleared by the [place]  FDA and  has been authorized for detection and/or diagnosis of SARS-CoV-2 by FDA under an Emergency Use Authorization (EUA). This EUA will remain  in effect (meaning this test can be used) for the duration of the COVID-19 declaration under Section 564(b)(1) of the Act, 21 U. S.C. section 360bbb-3(b)(1), unless the authorization is terminated or revoked sooner.   Performed at [facility], [address] [place], [place] [address]     Labs: CBC: Recent Labs  Lab 12/27/23 0848 12/29/23 6045 12/29/23 0748 12/30/23 0440 12/30/23 1418 12/31/23 0445  WBC 4.9 5.6  --  5.1 5.1 5.9  NEUTROABS 2.8 4.3  --   --   --   --   HGB 14.2 12.5 13.9 13.1 12.9 12.2  HCT 44.4 39.7 41.0 40.2 39.4 37.4  MCV 90.8 91.5  --  87.2 88.1 88.4  PLT 292 265  --  286 270 270   Basic Metabolic Panel: Recent Labs  Lab 12/27/23 0848 12/29/23 0648 12/29/23 0748 12/29/23 1854 12/30/23 0440 12/30/23 1418 12/31/23 0445  NA 138 137 140  --  140  --  137  K 3.6 3.5 3.5  --  3.6  --  3.5  CL 100 103 108  --  106  --  101  CO2 29 27  --   --  25  --  24  GLUCOSE 163* 173* 150*  --  149*  --  134*  BUN 12 10 7*  --  5*  --  7*  CREATININE 0.89 0.85 0.80  --  0.85 0.80 0.78  CALCIUM  9.4 8.9  --   --   9.0  --  8.7*  MG  --   --   --  2.1  --   --   --   PHOS  --   --   --  2.6  --   --   --    Liver Function Tests: Recent Labs  Lab 12/27/23 0848 12/29/23 0648 12/30/23 0440  AST 25 23 24   ALT 24 25 21   ALKPHOS 60 52 45  BILITOT 0.6 0.7 0.9  PROT 7.8 6.9 6.6  ALBUMIN 4.3 3.8 3.3*   CBG: Recent Labs  Lab 12/30/23 0816 12/30/23 1223 12/30/23 1707 12/30/23 2122 12/31/23 0755  GLUCAP 114* 126* 170* 123* 122*    Discharge time spent: approximately 45 minutes spent on discharge counseling, evaluation of patient on day of discharge, and coordination of discharge planning with nursing, social work, pharmacy and case management  Signed: Ephriam Hashimoto, MD [facility] 12/31/2023

## 2024-01-01 ENCOUNTER — Encounter (HOSPITAL_COMMUNITY): Payer: Self-pay | Admitting: Cardiology

## 2024-01-02 LAB — LIPOPROTEIN A (LPA): Lipoprotein (a): 108.1 nmol/L — ABNORMAL HIGH (ref <75.0)

## 2024-01-03 ENCOUNTER — Telehealth: Payer: Self-pay

## 2024-01-03 ENCOUNTER — Other Ambulatory Visit: Payer: Self-pay

## 2024-01-03 ENCOUNTER — Telehealth: Payer: Self-pay | Admitting: Hematology

## 2024-01-03 NOTE — Telephone Encounter (Signed)
 Pt called and stated she's just recently been d/c from hospital d/t heart attack.  Pt stated she's having chills, constipation, and abdominal pain (near her stomach).  Pt stated she restarted her Lutathera  tx and feel her symptoms maybe related to it.  Pt called wanting to make Dr. Maryalice Smaller aware of her recent hospitalization and of the symptoms she's having.  Asked pt has she reached out to any of her other providers Higher education careers adviser, [place] Provider for Lutathera , and PCP).  Pt stated "No".  Pt denied taking anything for pain or constipation.  Stated this nurse will make Dr. Maryalice Smaller aware of the pt's call and symptoms.

## 2024-01-05 ENCOUNTER — Inpatient Hospital Stay

## 2024-01-05 ENCOUNTER — Inpatient Hospital Stay: Attending: Hematology

## 2024-01-05 ENCOUNTER — Encounter: Payer: Self-pay | Admitting: Hematology

## 2024-01-05 ENCOUNTER — Inpatient Hospital Stay: Admitting: Hematology

## 2024-01-05 VITALS — BP 122/70 | HR 75 | Temp 97.2°F | Resp 20 | Ht 63.0 in | Wt 176.3 lb

## 2024-01-05 DIAGNOSIS — C7B8 Other secondary neuroendocrine tumors: Secondary | ICD-10-CM | POA: Diagnosis present

## 2024-01-05 DIAGNOSIS — C7A8 Other malignant neuroendocrine tumors: Secondary | ICD-10-CM | POA: Diagnosis not present

## 2024-01-05 DIAGNOSIS — C7A019 Malignant carcinoid tumor of the small intestine, unspecified portion: Secondary | ICD-10-CM

## 2024-01-05 DIAGNOSIS — Z79899 Other long term (current) drug therapy: Secondary | ICD-10-CM | POA: Insufficient documentation

## 2024-01-05 LAB — CMP (CANCER CENTER ONLY)
ALT: 12 U/L (ref 0–44)
AST: 17 U/L (ref 15–41)
Albumin: 4.1 g/dL (ref 3.5–5.0)
Alkaline Phosphatase: 53 U/L (ref 38–126)
Anion gap: 7 (ref 5–15)
BUN: 10 mg/dL (ref 8–23)
CO2: 30 mmol/L (ref 22–32)
Calcium: 9.2 mg/dL (ref 8.9–10.3)
Chloride: 101 mmol/L (ref 98–111)
Creatinine: 0.84 mg/dL (ref 0.44–1.00)
GFR, Estimated: >60 mL/min (ref >60)
Glucose, Bld: 100 mg/dL — ABNORMAL HIGH (ref 70–99)
Potassium: 3.3 mmol/L — ABNORMAL LOW (ref 3.5–5.1)
Sodium: 138 mmol/L (ref 135–145)
Total Bilirubin: 0.5 mg/dL (ref 0.0–1.2)
Total Protein: 7.1 g/dL (ref 6.5–8.1)

## 2024-01-05 LAB — CBC WITH DIFFERENTIAL (CANCER CENTER ONLY)
Abs Immature Granulocytes: 0.01 10*3/uL (ref 0.00–0.07)
Basophils Absolute: 0 10*3/uL (ref 0.0–0.1)
Basophils Relative: 1 %
Eosinophils Absolute: 0.2 10*3/uL (ref 0.0–0.5)
Eosinophils Relative: 4 %
HCT: 38.6 % (ref 36.0–46.0)
Hemoglobin: 12.6 g/dL (ref 12.0–15.0)
Immature Granulocytes: 0 %
Lymphocytes Relative: 27 %
Lymphs Abs: 1.3 10*3/uL (ref 0.7–4.0)
MCH: 28.4 pg (ref 26.0–34.0)
MCHC: 32.6 g/dL (ref 30.0–36.0)
MCV: 87.1 fL (ref 80.0–100.0)
Monocytes Absolute: 0.5 10*3/uL (ref 0.1–1.0)
Monocytes Relative: 10 %
Neutro Abs: 2.8 10*3/uL (ref 1.7–7.7)
Neutrophils Relative %: 58 %
Platelet Count: 341 10*3/uL (ref 150–400)
RBC: 4.43 MIL/uL (ref 3.87–5.11)
RDW: 12.6 % (ref 11.5–15.5)
WBC Count: 4.8 10*3/uL (ref 4.0–10.5)
nRBC: 0 % (ref 0.0–0.2)

## 2024-01-05 MED ORDER — OCTREOTIDE ACETATE 30 MG IM KIT
30.0000 mg | PACK | Freq: Once | INTRAMUSCULAR | Status: AC
  Administered 2024-01-05: 30 mg via INTRAMUSCULAR
  Filled 2024-01-05: qty 1

## 2024-01-05 NOTE — Assessment & Plan Note (Signed)
 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  Liver biopsy was attempted, but the liver lesion was not visible on ultrasound. -She started Sandostatin  injection in June 2022.  Overall tolerating well. Will continue.  -last restaging CT on 09/07/2022 showed stable disease  -She is clinically doing well overall, still has intermittent pain on the left side of abdomen, overall better than last year. -She has developed hyperglycemia, possibly related to Sandostatin  injection, she is on metformin  now.  Discussed diabetic diet, and I encouraged her to exercise. -Restaging CT scan from February 23, 2023 showed stable disease -CT in 08/2023 and dotatate PET scan in 09/2023 showed progression in liver  -She started Lutathera  therapy ON 12/27/2023

## 2024-01-05 NOTE — Progress Notes (Signed)
 [facility]   Telephone:(336) [phone] Fax:(336) [phone]   Clinic Follow up Note   Patient Care Team: Claudell Cruz, MD as PCP - General (Family Medicine) Jacqueline Matsu, MD as PCP - Sleep Medicine (Cardiology) Merriam Abbey, DO as Consulting Physician (Neurology) Burton, [name], NP as Nurse Practitioner (Nurse Practitioner) Sonja 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  Liver biopsy was attempted, but the liver lesion was not visible on ultrasound. -She started Sandostatin  injection in June 2022.  Overall tolerating well. Will continue.  -last restaging CT on 09/07/2022 showed stable disease  -She is clinically doing well overall, still has intermittent pain on the left side of abdomen, overall better than last year. -She has developed hyperglycemia, possibly related to Sandostatin  injection, she is on metformin  now.  Discussed diabetic diet, and I encouraged her to exercise. -Restaging CT scan from February 23, 2023 showed stable disease -CT in 08/2023 and dotatate PET scan in 09/2023 showed progression in liver  -She started Lutathera  therapy ON 12/27/2023   Assessment & Plan Neuroendocrine tumor Undergoing treatment with Lutasiris, experienced severe diarrhea and abdominal cramps post-treatment, leading to dehydration and hospitalization. Symptoms likely due to delayed senostatin injection. - Administer senostatin injection today to manage diarrhea and cramps. - Schedule next senostatin injection for May 22. - Coordinate senostatin  injection with next Lutasiris treatment on June 10.  Constipation Reports constipation following recent diarrhea and hospitalization. Bowel movements irregular with recent hard stools. Previously regular bowel movements before treatment. - Advise use of Miralax  if bowel movements remain hard. - Encourage increased dietary fiber, fruits, and vegetables. - Consider probiotics to aid regular bowel movements.  Recent myocardial infarction Experienced myocardial infarction post-Lutasiris treatment, confirmed during hospitalization. Cardiac catheterization showed 70% stenosis, no stent placed. Currently on medication and reports improvement. Symptoms likely exacerbated by treatment stress. - Ensure follow-up with cardiologist next month.  Coronary artery disease with stenosis Coronary artery disease with 70% stenosis identified during hospitalization. Likely pre-existing, not directly caused by treatment. On medication with symptom improvement. - Continue current medication regimen as prescribed by cardiologist. - Follow up with cardiologist as scheduled.  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 Genell Ken Impression - Normal upper third of esophagus, middle third of esophagus and lower third of esophagus. - Z-line regular, 38 cm from the incisors. - Erythematous mucosa in the antrum. Biopsied. Clip (MR  conditional) was placed. - Normal examined duodenum. Biopsied.   12/10/2020 Pathology Results   FINAL MICROSCOPIC DIAGNOSIS:   A. DUODENUM, BIOPSY:  -  Duodenal mucosa with no significant pathologic findings.  - Negative for increased intraepithelial lymphocytes and villous  architectural changes.   B. STOMACH, ANTRUM, BIOPSY:  - Reactive gastropathy.  - Warthin-Starry stain is negative for Helicobacter pylori.    12/11/2020 Procedure   Colonoscopy, Dr. Genell Ken impression - One 5 mm polyp in the transverse colon, removed piecemeal using a cold biopsy forceps. Resected and retrieved. - Diverticulosis in the sigmoid colon, in the descending colon and in the transverse colon. - The examined portion of the ileum was normal. - Non-bleeding internal hemorrhoids.   12/11/2020 Pathology Results   FINAL MICROSCOPIC DIAGNOSIS:   A. COLON, TRANSVERSE, POLYPECTOMY:  - Tubular adenoma.  - Negative for high grade dysplasia.    12/24/2020 Procedure   Capsule endoscopy report: A polypoid nodular growth was noted at 3 hours and 30 minutes and an ulcerated, polypoid growth was noted at 3 hours and 54 minutes which is likely the cause of obscure GI blood loss   01/23/2021 Imaging   CT entero AP w contrast IMPRESSION: 1. No acute findings identified within the abdomen or pelvis. 2. There are several focal areas of intraluminal hyperenhancement within the small bowel loops. The largest is in the nondilated mid to distal jejunum measuring 1.3 cm. Cannot rule out underlying small bowel neoplasm. Correlation with tissue sampling results advised. Correlation with results from capsule endoscopy. 3. There is a enhancing soft tissue nodule with central calcification within the central small bowel mesentery. This is nonspecific and may represent sequelae of inflammation/infection. Primary differential considerations include carcinoid tumor or metastatic adenopathy. If there is a clinical concern for  carcinoid tumor consider further investigation with DOTATATE PET. 4. Left adrenal gland adenoma. 5. Distal colonic diverticulosis without signs of acute diverticulitis. 6. Aortic atherosclerosis.   02/16/2021 PET scan   IMPRESSION: 1. Evidence of well differentiated small bowel neuroendocrine tumor with mesenteric metastasis. 2. Approximately 7 discrete small bowel lesions with intense radiotracer activity. Lesion depicted on comparison CT enterography. 3. Two intensely radiotracer avid mesenteric implants centrally within the small bowel mesentery. 4. Single hepatic metastasis with a second potential hepatic metastasis versus peritoneal implant. 5. No small bowel obstruction. 6. No evidence of metastatic disease outside the abdomen pelvis. 7. Patient may be a candidate for peptide receptor radiotherapy (Lu-177 DOTATATE) available at [facility] molecular imaging department [phone]).   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 No new or progressive interval findings. 2. Stable appearance of the 17 mm nodule central mesenteric nodule seen to be hypermetabolic on recent PET-CT. The second mesenteric lesion identified on that study is not discernible today. 3. Stable 1.5 cm subcapsular low-density lesion posterior right liver since PET-CT 02/16/2021. 4. 2 small bowel nodules are identified on imaging today although 7 discrete hypermetabolic small bowel lesions were seen on previous PET-CT. 5. Left colonic diverticulosis without diverticulitis. 6. Aortic Atherosclerosis (ICD10-I70.0).   02/22/2022  Imaging   EXAM: CT ABDOMEN AND PELVIS WITH CONTRAST  IMPRESSION: 1. Mural soft tissue nodule of the small bowel in the midline ventral abdomen is unchanged, as is a small adjacent metastatic mesenteric nodule. 2. No evidence of new  metastatic disease in the abdomen or pelvis. 3. Diverticulosis without evidence of acute diverticulitis. 4. Coronary artery disease.   08/25/2023 Imaging   CT chest, abdomen, and pelvis with contrast  IMPRESSION: 1. Increased size of the neuroendocrine metastasis measuring 2.1 cm in the right hepatic lobe. 2. Stable enhancing nonobstructive small bowel lesions. 3. Stable mesenteric nodule. 4. Stable tiny soft tissue nodules in the bilateral posterior gluteal subcutaneous space.      Discussed the use of AI scribe software for clinical note transcription with the patient, who gave verbal consent to proceed.  History of Present Illness The patient, a 68 year old female with a history of a neuroendocrine tumor, presents for follow-up after a recent Lutathera  treatment. She reports that two days after the treatment, she woke up with severe diarrhea, which was accompanied by profuse sweating and a feeling of faintness. She also experienced chest and lower back pain, which prompted her to seek emergency care. At the hospital, she was diagnosed with a small heart attack and was managed with medication. She reports that she had a 70% stenosis, which was not stented but managed with medication. She is scheduled to follow up with a cardiologist.  In addition to her cardiac issues, the patient has been experiencing lower stomach pain and irregular bowel movements since the Lutathera  treatment. She reports that she usually has daily bowel movements, but since the treatment, her bowel movements have been less frequent and harder in consistency. She had one episode of severe diarrhea before her hospital admission, after which her bowel movements became less frequent and  harder. She has been taking stool softeners as prescribed by her primary care physician.  The patient also mentions that she had been feeling exhausted and tired in the weeks leading up to her hospital admission, to the point where she would fall asleep suddenly. She believes these symptoms were precursors to her heart attack.     All other systems were reviewed with the patient and are negative.  MEDICAL HISTORY:  Past Medical History:  Diagnosis Date   Coronary artery disease    Depression    GI bleed 07/17/2015   Hypertension    Myocardial infarction [facility])    Nocturia 08/14/2019   Refusal of blood transfusions as patient is Jehovah's Witness     SURGICAL HISTORY: Past Surgical History:  Procedure Laterality Date   ABDOMINAL HYSTERECTOMY     BACK SURGERY     BIOPSY  12/10/2020   Procedure: BIOPSY;  Surgeon: Genell Ken, MD;  Location: [facility] ENDOSCOPY;  Service: Gastroenterology;;   BIOPSY  12/11/2020   Procedure: BIOPSY;  Surgeon: Genell Ken, MD;  Location: [facility] ENDOSCOPY;  Service: Gastroenterology;;   COLONOSCOPY WITH PROPOFOL  N/A 12/11/2020   Procedure: COLONOSCOPY WITH PROPOFOL ;  Surgeon: Genell Ken, MD;  Location: [facility] ENDOSCOPY;  Service: Gastroenterology;  Laterality: N/A;   ESOPHAGOGASTRODUODENOSCOPY N/A 07/17/2015   Procedure: ESOPHAGOGASTRODUODENOSCOPY (EGD);  Surgeon: Celedonio Coil, MD;  Location[facility] ENDOSCOPY;  Service: Endoscopy;  Laterality: N/A;   ESOPHAGOGASTRODUODENOSCOPY (EGD) WITH PROPOFOL  N/A 12/10/2020   Procedure: ESOPHAGOGASTRODUODENOSCOPY (EGD) WITH PROPOFOL ;  Surgeon: Genell Ken, MD;  Location: [facility] ENDOSCOPY;  Service: Gastroenterology;  Laterality: N/A;   HEMOSTASIS CLIP PLACEMENT  12/10/2020   Procedure: HEMOSTASIS CLIP PLACEMENT;  Surgeon: Genell Ken, MD;  Location: [facility] ENDOSCOPY;  Service: Gastroenterology;;   LEFT HEART CATH AND CORONARY ANGIOGRAPHY N/A 12/30/2023   Procedure: LEFT HEART CATH AND CORONARY ANGIOGRAPHY;  Surgeon: Swazil[place], Peter M, MD;  Location:  [facility] INVASIVE CV LAB;  Service: Cardiovascular;  Laterality: N/A;    I have reviewed the social history and family history with the patient and they are unchanged from previous note.  ALLERGIES:  is allergic to bactrim [sulfamethoxazole-trimethoprim].  MEDICATIONS:  Current Outpatient Medications  Medication Sig Dispense Refill   acetaminophen  (TYLENOL ) 650 MG CR tablet Take 650 mg by mouth every 8 (eight) hours as needed for pain.     aspirin  EC 81 MG tablet Take 1 tablet (81 mg total) by mouth daily. Swallow whole. 90 tablet 0   clopidogrel  (PLAVIX ) 75 MG tablet Take 1 tablet (75 mg total) by mouth daily. 90 tablet 0   Cyanocobalamin  1000 MCG LOZG Take 1,000 mcg by mouth in the morning.     cyclobenzaprine  (FLEXERIL ) 5 MG tablet Take 1 tablet (5 mg total) by mouth 3 (three) times daily as needed for muscle spasms. 30 tablet 0   DULoxetine  (CYMBALTA ) 60 MG capsule Take 60 mg by mouth in the morning.     fluticasone (FLONASE) 50 MCG/ACT nasal spray Place 1 spray into both nostrils as needed for allergies or rhinitis.     folic acid  (FOLVITE ) 800 MCG tablet Take 400 mcg by mouth in the morning.     gabapentin  (NEURONTIN ) 100 MG capsule Take 1 capsule (100 mg total) by mouth at bedtime. 30 capsule 5   isosorbide  mononitrate (IMDUR ) 30 MG 24 hr tablet Take 1 tablet (30 mg total) by mouth daily. 90 tablet 0   losartan -hydrochlorothiazide  (HYZAAR) 50-12.5 MG tablet Take 1 tablet by mouth in the morning.     metFORMIN  (GLUCOPHAGE -XR) 500 MG 24 hr tablet Take 500 mg by mouth daily with breakfast.     Multiple Vitamins-Minerals (MULTIVITAMIN ADULTS 50+) TABS Take 1 tablet by mouth daily.     nitroGLYCERIN  (NITROSTAT ) 0.4 MG SL tablet Place 1 tablet (0.4 mg total) under the tongue every 5 (five) minutes as needed for chest pain. 30 tablet 0   ondansetron  (ZOFRAN ) 8 MG tablet Take 1 tablet (8 mg total) by mouth 2 (two) times daily as needed for nausea or vomiting. 20 tablet 0   pantoprazole  (PROTONIX )  40 MG tablet Take 40 mg by mouth in the morning.     rosuvastatin  (CRESTOR ) 20 MG tablet Take 20 mg by mouth in the morning.     traMADol  (ULTRAM ) 50 MG tablet Take 1 tablet (50 mg total) by mouth every 12 (twelve) hours as needed. 20 tablet 0   trolamine salicylate (ASPERCREME) 10 % cream Apply 1 application topically as needed for muscle pain.     No current facility-administered medications for this visit.    PHYSICAL EXAMINATION: ECOG PERFORMANCE STATUS: 1 - Symptomatic but completely ambulatory  Vitals:   01/05/24 1308  BP: 122/70  Pulse: 75  Resp: 20  Temp: (!) 97.2 F (36.2 C)  SpO2: 95%   Wt Readings from Last 3 Encounters:  01/05/24 176 lb 4.8 oz (80 kg)  12/29/23 177 lb 6.4 oz (80.5 kg)  12/21/23 186 lb 4.8 oz (84.5 kg)     GENERAL:alert, no distress and comfortable SKIN: skin color, texture, turgor are normal, no rashes or significant lesions EYES: normal, Conjunctiva are pink and non-injected, sclera clear NECK: supple, thyroid  normal size, non-tender, without nodularity LYMPH:  no palpable lymphadenopathy in the cervical, axillary  LUNGS: clear to auscultation and percussion with normal breathing effort HEART: regular rate & rhythm and no murmurs and no lower extremity edema ABDOMEN:abdomen soft, non-tender and normal bowel sounds Musculoskeletal:no cyanosis of digits  and no clubbing  NEURO: alert & oriented x 3 with fluent speech, no focal motor/sensory deficits  Physical Exam    LABORATORY DATA:  I have reviewed the data as listed    Latest Ref Rng & Units 01/05/2024   12:50 PM 12/31/2023    4:45 AM 12/30/2023    2:18 PM  CBC  WBC 4.0 - 10.5 K/uL 4.8  5.9  5.1   Hemoglobin 12.0 - 15.0 g/dL 84.6  96.2  95.2   Hematocrit 36.0 - 46.0 % 38.6  37.4  39.4   Platelets 150 - 400 K/uL 341  270  270         Latest Ref Rng & Units 01/05/2024   12:50 PM 12/31/2023    4:45 AM 12/30/2023    2:18 PM  CMP  Glucose 70 - 99 mg/dL 841  324    BUN 8 - 23 mg/dL 10   7    Creatinine 4.01 - 1.00 mg/dL 0.27  2.53  6.64   Sodium 135 - 145 mmol/L 138  137    Potassium 3.5 - 5.1 mmol/L 3.3  3.5    Chloride 98 - 111 mmol/L 101  101    CO2 22 - 32 mmol/L 30  24    Calcium  8.9 - 10.3 mg/dL 9.2  8.7    Total Protein 6.5 - 8.1 g/dL 7.1     Total Bilirubin 0.0 - 1.2 mg/dL 0.5     Alkaline Phos 38 - 126 U/L 53     AST 15 - 41 U/L 17     ALT 0 - 44 U/L 12         RADIOGRAPHIC STUDIES: I have personally reviewed the radiological images as listed and agreed with the findings in the report. No results found.    No orders of the defined types were placed in this encounter.  All questions were answered. The patient knows to call the clinic with any problems, questions or concerns. No barriers to learning was detected. The total time spent in the appointment was 25 minutes.     Sonja Whiterocks, MD 01/05/2024

## 2024-01-19 NOTE — Progress Notes (Signed)
 Cardiology Office Note    Patient Name: Tanya Walsh Date of Encounter: 01/19/2024  Primary Care Provider:  Claudell Cruz, MD Primary Cardiologist:  None Primary Electrophysiologist: None   Past Medical History    Past Medical History:  Diagnosis Date   Coronary artery disease    Depression    GI bleed 07/17/2015   Hypertension    Myocardial infarction [facility])    Nocturia 08/14/2019   Refusal of blood transfusions as patient is Jehovah's Witness     History of Present Illness  Tanya Walsh is a 68 y.o. female with a PMH of CAD NSTEMI 12/30/2023 with prior MI in [place] , details unknown, HTN, HLD, OSA, metastatic small intestine and liver CA, GI bleed who presents today for post hospital follow-up.  Tanya Walsh was previously followed by Dr. Felipe Horton and is currently followed by Dr. Micael Adas for management of coronary disease.  She was seen initially in 04/2014 with complaint of chest pain.  She completed an exercise Myoview  on 04/28/2014 that demonstrated normal blood flow.  She was seen in follow-up on 07/25/2019 with ongoing complaint of chest pain and she completed a CT angio that demonstrated no evidence of CAD obstruction with a calcium  score of 594 at 97 percentile.  CT FFR analysis did not show any significant stenosis.  She was diagnosed with neuroendocrine tumor in 2022 metastasis to the liver.  She was treated with Sandostatin  injections on 02/2021.  She completed a restaging CT done 09/07/2022 that shows stable disease with plan for surveillance CTs every 6 months.  She was seen recently with complaint of fatigue and PET scan completed and found to have progressing well-differentiated neuroendocrine tumor within the liver and bowel despite chemotherapy.  She was started on radiation therapy. 2D echo was completed on 07/28/2022 with EF of 70-75% and mild LVH with grade 1 DD and no significant valve abnormalities present.  She was seen in the ED on 12/29/23 with complaint of  abdominal and chest pain.  She had recently began radiation therapy due to known metastasis of neuroendocrine tumor.  She reported substernal chest pain that radiated into the left arm which improved with nitroglycerin .  She was also given an aspirin  and noted associated SOB.  EKG and echocardiogram completed without significant abnormalities and troponins were elevated at 1600.  She also noted significant diarrhea with potassium of 2.6 and Troponins gradually increased [phone].  CTA of the chest was negative for PE and the patient underwent an LHC that showed 70% RCA stenosis with normal LVEDP and 30% stenosis in left main to ostial LAD and ostial circumflex lesion of 50%.  Decision was made to optimize medical therapy with consideration of PCI if refractory to medication.  She was started on ASA and Plavix  with Imdur .  She was continued on Crestor  with cardiac rehab ordered.  Tanya Walsh presents today for posthospital follow-up.  She reports since her hospitalization feeling better but does endorse headaches, which she associates with the isosorbide  medication. The headaches are described as severe, lasting from ear to ear, and sometimes at the back of her head. She takes Tylenol  for relief, which is variably effective. Some days the headaches are persistent despite rest and medication. She notes minor bleeding issues, such as clogs of blood in her nose and ear, which she associates with Plavix  use. No significant bleeding or bruising elsewhere. She has a history of high blood pressure, which she monitors regularly.  Her blood pressure today was controlled at 118/78.  She reports fluctuations in her blood pressure but notes it is currently controlled. 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 Patient denies chest pain, palpitations, dyspnea, PND, orthopnea, nausea, vomiting, dizziness, syncope, edema, weight gain, or early  satiety.  Discussed the use of AI scribe software for clinical note transcription with the patient, who gave verbal consent to proceed.  History of Present Illness     Review of Systems  Please see the history of present illness.    All other systems reviewed and are otherwise negative except as noted above.  Physical Exam     Wt Readings from Last 3 Encounters:  01/05/24 176 lb 4.8 oz (80 kg)  12/29/23 177 lb 6.4 oz (80.5 kg)  12/21/23 186 lb 4.8 oz (84.5 kg)   UV:OZDGU were no vitals filed for this visit.,There is no height or weight on file to calculate BMI. GEN: Well nourished, well developed in no acute distress Neck: No JVD; No carotid bruits Pulmonary: Clear to auscultation without rales, wheezing or rhonchi  Cardiovascular: Normal rate. Regular rhythm. Normal S1. Normal S2.   Murmurs: There is no murmur.  ABDOMEN: Soft, non-tender, non-distended EXTREMITIES:  No edema; No deformity   EKG/LABS/ Recent Cardiac Studies   ECG personally reviewed by me today -none completed today  Risk Assessment/Calculations:          Lab Results  Component Value Date   WBC 4.8 01/05/2024   HGB 12.6 01/05/2024   HCT 38.6 01/05/2024   MCV 87.1 01/05/2024   PLT 341 01/05/2024   Lab Results  Component Value Date   CREATININE 0.84 01/05/2024   BUN 10 01/05/2024   NA 138 01/05/2024   K 3.3 (L) 01/05/2024   CL 101 01/05/2024   CO2 30 01/05/2024   Lab Results  Component Value Date   CHOL 117 12/31/2023   HDL 53 12/31/2023   LDLCALC 52 12/31/2023   TRIG 61 12/31/2023   CHOLHDL 2.2 12/31/2023    Lab Results  Component Value Date   HGBA1C 7.5 (H) 12/29/2023   Assessment & Plan   Assessment & Plan   1.  Coronary artery disease: -s/p NSTEMI with LHC on 12/30/2023 showing 50% ostial circumflex lesion with 30% distal left main to ostial LAD lesion and 70% proximal RCA lesion treated medically with Plavix  and Imdur . - Today patient reports no chest pain or angina since  previous follow-up. -Patient is ready for cardiac rehab. - Order cardiac rehab. - Continue aspirin , Plavix , isosorbide , and Crestor . - Educated on signs of refractory discomfort and when to seek medical attention. - Educated on rationale for medical therapy over stenting. - Reduce isosorbide  dose to 15 mg with option to take 30 mg if needed. - Monitor headache frequency and intensity. - Consider alternative medications if headaches persist.  2.  Essential hypertension: - Patient's blood pressure today was controlled at 118/78 - EKG showed mild hypertensive heart disease with LVH - Continue Hyzaar 50-12.5 mg and Imdur  50 mg daily  3.  Hyperlipidemia: - Patient's LDL cholesterol was stable at 52 with total cholesterol of 117 and triglycerides of 51 - Continue Crestor  20 mg daily  4.  Malignant neoplasm of small intestine: - She is currently undergoing treatment chemotherapy injections. - Continue current treatment plan per oncology.  5.  History of OSA: - Continue CPAP and ongoing compliance.  Disposition: Follow-up with None or APP in 3 months    Signed, Valentina Gasman  Tomas Fountain, NP 01/19/2024, 7:50 AM [facility]

## 2024-01-20 ENCOUNTER — Encounter: Payer: Self-pay | Admitting: Nurse Practitioner

## 2024-01-20 ENCOUNTER — Ambulatory Visit: Attending: Nurse Practitioner | Admitting: Nurse Practitioner

## 2024-01-20 VITALS — BP 118/78 | HR 76 | Ht 63.0 in | Wt 176.8 lb

## 2024-01-20 DIAGNOSIS — C7A8 Other malignant neuroendocrine tumors: Secondary | ICD-10-CM

## 2024-01-20 DIAGNOSIS — I2510 Atherosclerotic heart disease of native coronary artery without angina pectoris: Secondary | ICD-10-CM | POA: Diagnosis not present

## 2024-01-20 DIAGNOSIS — E785 Hyperlipidemia, unspecified: Secondary | ICD-10-CM

## 2024-01-20 DIAGNOSIS — I10 Essential (primary) hypertension: Secondary | ICD-10-CM | POA: Diagnosis not present

## 2024-01-20 DIAGNOSIS — G4733 Obstructive sleep apnea (adult) (pediatric): Secondary | ICD-10-CM

## 2024-01-20 MED ORDER — ISOSORBIDE MONONITRATE ER 30 MG PO TB24: 15.0000 mg | ORAL_TABLET | Freq: Every day | ORAL | Status: DC

## 2024-01-20 NOTE — Patient Instructions (Addendum)
 Medication Instructions:  DECREASE Imdur  15mg  Take half tablet daily and can take a whole tablet (30mg ) if needed *If you need a refill on your cardiac medications before your next appointment, please call your pharmacy*  Lab Work: None ordered If you have labs (blood work) drawn today and your tests are completely normal, you will receive your results only by: MyChart Message (if you have MyChart) OR A paper copy in the mail If you have any lab test that is abnormal or we need to change your treatment, we will call you to review the results.  Testing/Procedures: None ordered  Follow-Up: At [facility], you and your health needs are our priority.  As part of our continuing mission to provide you with exceptional heart care, our providers are all part of one team.  This team includes your primary Cardiologist (physician) and Advanced Practice Providers or APPs (Physician Assistants and Nurse Practitioners) who all work together to provide you with the care you need, when you need it.  Your next appointment:   3 month(s)  Provider:   Gaylyn Keas, MD or Charles Connor, NP   We recommend signing up for the patient portal called "MyChart".  Sign up information is provided on this After Visit Summary.  MyChart is used to connect with patients for Virtual Visits (Telemedicine).  Patients are able to view lab/test results, encounter notes, upcoming appointments, etc.  Non-urgent messages can be sent to your provider as well.   To learn more about what you can do with MyChart, go to [email or web address].   Other Instructions START CARDIAC REHAB

## 2024-02-02 ENCOUNTER — Inpatient Hospital Stay: Attending: Hematology

## 2024-02-02 ENCOUNTER — Other Ambulatory Visit (HOSPITAL_COMMUNITY): Payer: Self-pay | Admitting: *Deleted

## 2024-02-02 VITALS — BP 135/85 | HR 92 | Resp 18

## 2024-02-02 DIAGNOSIS — C7B8 Other secondary neuroendocrine tumors: Secondary | ICD-10-CM | POA: Diagnosis present

## 2024-02-02 DIAGNOSIS — I214 Non-ST elevation (NSTEMI) myocardial infarction: Secondary | ICD-10-CM

## 2024-02-02 DIAGNOSIS — C7A8 Other malignant neuroendocrine tumors: Secondary | ICD-10-CM | POA: Insufficient documentation

## 2024-02-02 MED ORDER — OCTREOTIDE ACETATE 30 MG IM KIT
30.0000 mg | PACK | Freq: Once | INTRAMUSCULAR | Status: AC
  Administered 2024-02-02: 30 mg via INTRAMUSCULAR
  Filled 2024-02-02: qty 1

## 2024-02-09 ENCOUNTER — Telehealth (HOSPITAL_COMMUNITY): Payer: Self-pay

## 2024-02-09 NOTE — Telephone Encounter (Signed)
 Attempted to call patient in regards to Cardiac Rehab - LM on VM

## 2024-02-09 NOTE — Telephone Encounter (Signed)
 Pt insurance is active and benefits verified through [facility]. Co-pay $0.00, DED $0.00/$0.00 met, out of pocket $3,900.00/$3,900.00 met, co-insurance 0%. No pre-authorization required. Passport, 02/09/24 @ 3:42PM, REF#[id number]   How many CR sessions are covered? (36 visits for TCR, 72 visits for ICR)72 Is this a lifetime maximum or an annual maximum? Annual Has the member used any of these services to date? No Is there a time limit (weeks/months) on start of program and/or program completion? No

## 2024-02-14 ENCOUNTER — Encounter (HOSPITAL_COMMUNITY): Payer: Self-pay

## 2024-02-14 ENCOUNTER — Telehealth (HOSPITAL_COMMUNITY): Payer: Self-pay

## 2024-02-14 NOTE — Telephone Encounter (Signed)
 Called patient to see if she was interested in participating in the Cardiac Rehab Program. Patient will come in for orientation on 6/10@8am  and will attend the 1:45 exercise class.   Pensions consultant.

## 2024-02-14 NOTE — Telephone Encounter (Signed)
 Pt was unable to do the cardiac rehab orientation on 6/10. Pt will come in for orientation on 6/12@8am  and will attend the 1:45 class time

## 2024-02-20 ENCOUNTER — Telehealth (HOSPITAL_COMMUNITY): Payer: Self-pay

## 2024-02-20 NOTE — Telephone Encounter (Signed)
 Patient called stating she is starting radiation therapy tomorrow 6/10 and should not be in contact with other people for about a week. She wanted to reschedule her CR orientation scheduled on 6/12. Patient has rescheduled to orient on 6/24 @ 1:15pm, patient will attend 1:45pm class and start 6/30.

## 2024-02-20 NOTE — Written Directive (Cosign Needed)
 LUTATHERA  THERAPY   RADIOPHARMACEUTICAL:  Lutetium 177 Dotatate (Lutathera )     PRESCRIBED DOSE FOR ADMINISTRATION:  200 mCi   ROUTE OFADMINISTRATION:  IV   DIAGNOSIS:  Primary malignant neuroendocrine tumor of small intestine    REFERRING PHYSICIAN: Dr Maryalice Smaller    TREATMENT #: 2    DATE OF LAST LONG LIVED SOMATOSTATIN INJECTION:  Octreotide  02/02/2024    ADDITIONAL PHYSICIAN COMMENTS/NOTES:   AUTHORIZED USER SIGNATURE & TIME STAMP: Reino Carbo, MD   02/21/24    8:18 AM

## 2024-02-21 ENCOUNTER — Telehealth (HOSPITAL_COMMUNITY): Payer: Self-pay

## 2024-02-21 ENCOUNTER — Ambulatory Visit (HOSPITAL_COMMUNITY)
Admission: RE | Admit: 2024-02-21 | Discharge: 2024-02-21 | Disposition: A | Discharge status: Home / Self Care | Source: Ambulatory Visit | Attending: Hematology | Admitting: Hematology

## 2024-02-21 VITALS — BP 113/62 | HR 75 | Resp 20

## 2024-02-21 DIAGNOSIS — C7A8 Other malignant neuroendocrine tumors: Secondary | ICD-10-CM | POA: Insufficient documentation

## 2024-02-21 LAB — CBC WITH DIFFERENTIAL/PLATELET
Abs Immature Granulocytes: 0 10*3/uL (ref 0.00–0.07)
Basophils Absolute: 0 10*3/uL (ref 0.0–0.1)
Basophils Relative: 1 %
Eosinophils Absolute: 0.2 10*3/uL (ref 0.0–0.5)
Eosinophils Relative: 5 %
HCT: 39 % (ref 36.0–46.0)
Hemoglobin: 12.3 g/dL (ref 12.0–15.0)
Immature Granulocytes: 0 %
Lymphocytes Relative: 31 %
Lymphs Abs: 1 10*3/uL (ref 0.7–4.0)
MCH: 29.2 pg (ref 26.0–34.0)
MCHC: 31.5 g/dL (ref 30.0–36.0)
MCV: 92.6 fL (ref 80.0–100.0)
Monocytes Absolute: 0.3 10*3/uL (ref 0.1–1.0)
Monocytes Relative: 10 %
Neutro Abs: 1.8 10*3/uL (ref 1.7–7.7)
Neutrophils Relative %: 53 %
Platelets: 272 10*3/uL (ref 150–400)
RBC: 4.21 MIL/uL (ref 3.87–5.11)
RDW: 13.2 % (ref 11.5–15.5)
WBC: 3.3 10*3/uL — ABNORMAL LOW (ref 4.0–10.5)
nRBC: 0 % (ref 0.0–0.2)

## 2024-02-21 LAB — COMPREHENSIVE METABOLIC PANEL WITH GFR
ALT: 19 U/L (ref 0–44)
AST: 24 U/L (ref 15–41)
Albumin: 3.7 g/dL (ref 3.5–5.0)
Alkaline Phosphatase: 55 U/L (ref 38–126)
Anion gap: 8 (ref 5–15)
BUN: 8 mg/dL (ref 8–23)
CO2: 25 mmol/L (ref 22–32)
Calcium: 8.8 mg/dL — ABNORMAL LOW (ref 8.9–10.3)
Chloride: 103 mmol/L (ref 98–111)
Creatinine, Ser: 0.89 mg/dL (ref 0.44–1.00)
GFR, Estimated: >60 mL/min (ref >60)
Glucose, Bld: 197 mg/dL — ABNORMAL HIGH (ref 70–99)
Potassium: 3.1 mmol/L — ABNORMAL LOW (ref 3.5–5.1)
Sodium: 136 mmol/L (ref 135–145)
Total Bilirubin: 0.9 mg/dL (ref 0.0–1.2)
Total Protein: 7.1 g/dL (ref 6.5–8.1)

## 2024-02-21 LAB — GLUCOSE, CAPILLARY
Glucose-Capillary: 75 mg/dL (ref 70–99)
Glucose-Capillary: 119 mg/dL — ABNORMAL HIGH (ref 70–99)

## 2024-02-21 MED ORDER — ONDANSETRON HCL 8 MG PO TABS: 8.0000 mg | ORAL_TABLET | Freq: Two times a day (BID) | ORAL | 0 refills | Status: AC | PRN

## 2024-02-21 MED ORDER — OCTREOTIDE ACETATE 30 MG IM KIT
30.0000 mg | PACK | Freq: Once | INTRAMUSCULAR | Status: AC
  Administered 2024-02-21: 30 mg via INTRAMUSCULAR

## 2024-02-21 MED ORDER — SODIUM CHLORIDE 0.9 % IV SOLN: 500.0000 mL | Freq: Once | INTRAVENOUS | Status: DC

## 2024-02-21 MED ORDER — OCTREOTIDE ACETATE 500 MCG/ML IJ SOLN
500.0000 ug | Freq: Once | INTRAMUSCULAR | Status: AC | PRN
  Administered 2024-02-21: 500 ug via INTRAVENOUS

## 2024-02-21 MED ORDER — LUTETIUM LU 177 DOTATATE 370 MBQ/ML IV SOLN
200.0000 | Freq: Once | INTRAVENOUS | Status: AC
  Administered 2024-02-21: 200 via INTRAVENOUS

## 2024-02-21 MED ORDER — OCTREOTIDE ACETATE 30 MG IM KIT
PACK | INTRAMUSCULAR | Status: AC
  Filled 2024-02-21: qty 1

## 2024-02-21 MED ORDER — AMINO ACID RADIOPROTECTANT - L-LYSINE 2.5%/L-ARGININE 2.5% IN NS
250.0000 mL/h | INTRAVENOUS | Status: AC
  Administered 2024-02-21: 250 mL/h via INTRAVENOUS
  Filled 2024-02-21: qty 1000

## 2024-02-21 MED ORDER — SODIUM CHLORIDE 0.9 % IV SOLN
8.0000 mg | Freq: Once | INTRAVENOUS | Status: AC
  Administered 2024-02-21: 8 mg via INTRAVENOUS
  Filled 2024-02-21: qty 4

## 2024-02-21 MED ORDER — PROCHLORPERAZINE EDISYLATE 10 MG/2ML IJ SOLN: 10.0000 mg | Freq: Four times a day (QID) | INTRAMUSCULAR | Status: DC | PRN

## 2024-02-21 MED ORDER — OCTREOTIDE ACETATE 500 MCG/ML IJ SOLN
INTRAMUSCULAR | Status: AC
  Filled 2024-02-21: qty 1

## 2024-02-21 NOTE — Progress Notes (Signed)
 Patient having improvement of symptoms. Tolerating PO intake. Recheck CBg 119

## 2024-02-21 NOTE — Progress Notes (Signed)
 CLINICAL DATA: [[age] year-old [female] with metastatic neuroendocrine tumor. Well differentiated tumor with somatostatin receptor is identified within the [liver] by DOTATATE PET CT scan.  EXAM: NUCLEAR MEDICINE LUTATHERA  ADMINISTRATION  TECHNIQUE: Infusion: The nuclear medicine technologist and I personally verified the dose activity ([207] mCi) to be delivered as specified in the written directive (200 mCi), and verified the patient identification via 2 separate methods.  20 gauge IV were started in the antecubital veins. Anti-emetics were administered by nursing staff. Amino acid renal protection was initiated 30 minutes prior to Lu 177 DOTATATE (Lutathera ) infusion and continued continuously for 4 hours. Lutathera  infusion was administered over 30 minutes.      The total administered dose was [206] mCi Lu 177 DOTATATE.    The entire IV tubing, venocatheter, stopcock and syringes was removed in total, placed in a disposal bag and sent for assay of the residual activity, which will be reported at a later time in our EMR by the physics staff. Pressure was applied to the venipuncture sites, and a compression bandage placed. Radiation Safety personnel were present to perform the discharge survey, as detailed on their documentation.    Patient received 30 mg IM long-acting Sandostatin  injection 4 hours after Lutathera  effusion in the nuclear medicine department.   RADIOPHARMACEUTICALS:   [Two hundred six] mCi Lu 177 DOTATATE   FINDINGS: Diagnosis: [Metastatic neuroendocrine tumor.]    Current Infusion: [2    Planned Infusions: [4]    Patient reports [severe diarrhea] symptoms following therapy.  Diarrhea occurred several days after therapy.  Patient required hospitalization head of mild myocardial infarction.  Patient's post therapy long-acting Sandostatin  was postponed after initial therapy 2 cord 8 with oncology injections.  Plan today is to inject post therapy and continue  4 week cycle.     Currently patient's labs are stable.  No myelosuppression.  Normal renal function.     The patient's most recent blood counts were reviewed and remains a good candidate to proceed with Lutathera .     The patient was situated in an infusion suite and administered Lutathera  as above.      Approximately 2-3 hours after infusion of Lu 177 Dotatate, the patient experienced a rapid onset of diarrhea.  Patient also felt faint and required  aid in walking.  Patient was mildly hypotensive.  Patient was had relative hypoglycemic.     Patient was administered 500 mcg short-acting octreotide .  Additionally patient was hydrated with additional IV fluids as well as given snacks.     Patient stabilized over the next hour  and was able to be a discharged on her record.  Patient was advised if symptoms return or worsen to report to ED.     Patient will follow-up with referring oncologist for interval serum laboratories (CBC and CMP) in approximately 4 weeks.       Patient received 30 mg IM long-acting Sandostatin  injection 4 hours after Lutathera  effusion in the nuclear medicine department.     IMPRESSION: [Second] Lu 177 DOTATATE treatment for metastatic neuroendocrine tumor.  Patient has significant episodes of adverse reaction as described above.  Oncologist consulted on potential cessation of treatment after 2 therapies.

## 2024-02-21 NOTE — Progress Notes (Signed)
 Patient having active diarrhea with complains of generalized weakness. BP obtained. CBG 75. IV fluid bolus running and attempting PO intake.

## 2024-02-22 ENCOUNTER — Telehealth: Payer: Self-pay

## 2024-02-22 NOTE — Telephone Encounter (Signed)
 Rec'd Secure Chat message from Weldon Hales in [place] here at [facility] stating Patient mentioned something yesterday while here for her Lutathera  that she may stop treatments.  Spoke with pt via telephone regarding Lutathera  txs.  Pt stated while there her BG dropped to the point where she was about to faint; therefore, pt stated Dr. Dortha Gauss mentioned something about pt may need to stop the Lutathera  txs or the dose may need to be reduced.  Pt stated Dr. Dortha Gauss stated he would contact Dr. Maryalice Smaller to see how she would like to proceed.  Stated this nurse will f/u with Dr. Dortha Gauss and Dr. Candise Chambers covering providers on how to proceed with pt's next Lutathera  tx in August 2025.  Pt verbalized understanding and had no further questions at this time.

## 2024-02-23 ENCOUNTER — Ambulatory Visit (HOSPITAL_COMMUNITY)

## 2024-02-23 LAB — CHROMOGRANIN A: Chromogranin A (ng/mL): 36.5 ng/mL (ref 0.0–101.8)

## 2024-02-23 NOTE — Progress Notes (Deleted)
 Spoke with Patient by telephone this morning. She is doing well with only expected fatigue two days post Lu-177 DOTATATE therapy.  No diarrhea. No Nausea or vomiting.

## 2024-02-29 ENCOUNTER — Ambulatory Visit (HOSPITAL_COMMUNITY)

## 2024-02-29 ENCOUNTER — Other Ambulatory Visit: Payer: Self-pay | Admitting: Nurse Practitioner

## 2024-02-29 DIAGNOSIS — C7A8 Other malignant neuroendocrine tumors: Secondary | ICD-10-CM

## 2024-02-29 NOTE — Progress Notes (Deleted)
 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  Liver biopsy was attempted, but the liver lesion was not visible on ultrasound. -She started Sandostatin  injection in June 2022.  Overall tolerating well. Will continue.  -last restaging CT on 09/07/2022 showed stable disease  -She is clinically doing well overall, still has intermittent pain on the left side of abdomen, overall better than last year. -She has developed hyperglycemia, possibly related to Sandostatin  injection, she is on metformin  now.  Discussed diabetic diet, and I encouraged her to exercise. -Restaging CT scan from February 23, 2023 showed stable disease -CT in 08/2023 and dotatate PET scan in 09/2023 showed progression in liver  -She started Lutathera  therapy ON 12/27/2023 - Received Sandostatin  injection on 01/05/2024 and 02/02/2024.  Scheduled for Sandostatin  today, 03/01/2024.    The patient understands the plans discussed today and is in agreement with them.  She knows to contact our office if she develops concerns prior to her next appointment.  I provided *** minutes of face-to-face time during this encounter and > 50% was spent counseling as documented under my assessment and plan.    Sharyon Deis, NP  [facility] - A DEPT OF [facility] [address] [place]  [place] [address] Dept: [phone] Dept Fax: [phone]   No orders of the defined types were placed in this encounter.     CHIEF COMPLAINT:  CC: Neuroendocrine tumor of small intestine  Current Treatment: Sandostatin  every 28 days; Lutathera   INTERVAL HISTORY:  Tanya Walsh is here today for repeat clinical assessment.  She most recently saw Dr. Maryalice Smaller on 01/05/2024.  Most recent Lutathera  treatment given 02/21/2024.  Sandostatin  injection has been given to help with diarrhea and abdominal cramping.  Most recent chromogranin a done on 02/21/2024 and was normal at 36.5.  She denies fevers or chills. She denies pain. Her appetite is good. Her weight {Weight change:10426}.  I have reviewed the past medical history, past surgical history, social history and family history with the patient and they are unchanged from previous note.  ALLERGIES:  is allergic to bactrim [sulfamethoxazole-trimethoprim].  MEDICATIONS:  Current Outpatient Medications  Medication Sig Dispense Refill   acetaminophen  (TYLENOL ) 650 MG CR tablet Take 650 mg by mouth every 8 (eight) hours as needed for pain.     aspirin  EC 81 MG tablet Take 1 tablet (81 mg total) by mouth daily. Swallow whole. 90 tablet 0   clopidogrel  (PLAVIX ) 75 MG tablet Take 1 tablet (75 mg total) by mouth daily. 90 tablet 0   Cyanocobalamin  1000 MCG LOZG Take 1,000 mcg by mouth in the morning.     cyclobenzaprine  (FLEXERIL ) 5 MG tablet Take 1 tablet (5 mg total) by mouth 3 (three) times daily as needed for muscle spasms. 30 tablet 0   DULoxetine  (CYMBALTA ) 60 MG capsule Take 60 mg by mouth in the morning.  fluticasone (FLONASE) 50 MCG/ACT nasal spray Place 1 spray into both nostrils as needed for allergies or rhinitis.     folic acid  (FOLVITE ) 800 MCG tablet Take 400 mcg by mouth in the morning.     gabapentin  (NEURONTIN ) 100 MG capsule Take 1 capsule (100 mg total) by mouth at bedtime. 30 capsule 5   isosorbide  mononitrate (IMDUR ) 30 MG 24 hr tablet Take  0.5 tablets (15 mg total) by mouth daily. Can take a whole tablet if needed     losartan -hydrochlorothiazide  (HYZAAR) 50-12.5 MG tablet Take 1 tablet by mouth in the morning.     metFORMIN  (GLUCOPHAGE -XR) 500 MG 24 hr tablet Take 500 mg by mouth daily with breakfast.     Multiple Vitamins-Minerals (MULTIVITAMIN ADULTS 50+) TABS Take 1 tablet by mouth daily.     nitroGLYCERIN  (NITROSTAT ) 0.4 MG SL tablet Place 1 tablet (0.4 mg total) under the tongue every 5 (five) minutes as needed for chest pain. 30 tablet 0   ondansetron  (ZOFRAN ) 8 MG tablet Take 1 tablet (8 mg total) by mouth 2 (two) times daily as needed for nausea or vomiting. 20 tablet 0   pantoprazole  (PROTONIX ) 40 MG tablet Take 40 mg by mouth in the morning.     rosuvastatin  (CRESTOR ) 20 MG tablet Take 20 mg by mouth in the morning.     traMADol  (ULTRAM ) 50 MG tablet Take 1 tablet (50 mg total) by mouth every 12 (twelve) hours as needed. 20 tablet 0   trolamine salicylate (ASPERCREME) 10 % cream Apply 1 application topically as needed for muscle pain.     No current facility-administered medications for this visit.    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 Genell Ken Impression - Normal upper third of esophagus, middle third of esophagus and lower third of esophagus. - Z-line regular, 38 cm from the incisors. - Erythematous mucosa in the antrum. Biopsied. Clip (MR conditional) was placed. - Normal examined duodenum. Biopsied.   12/10/2020 Pathology Results   FINAL MICROSCOPIC DIAGNOSIS:   A.  DUODENUM, BIOPSY:  - Duodenal mucosa with no significant pathologic findings.  - Negative for increased intraepithelial lymphocytes and villous  architectural changes.   B. STOMACH, ANTRUM, BIOPSY:  - Reactive gastropathy.  - Warthin-Starry stain is negative for Helicobacter pylori.    12/11/2020 Procedure   Colonoscopy, Dr. Genell Ken impression - One 5 mm polyp in the transverse colon, removed piecemeal using a cold biopsy forceps. Resected and retrieved. - Diverticulosis in the sigmoid colon, in the descending colon and in the transverse colon. - The examined portion of the ileum was normal. - Non-bleeding internal hemorrhoids.   12/11/2020 Pathology Results   FINAL MICROSCOPIC DIAGNOSIS:   A. COLON, TRANSVERSE, POLYPECTOMY:  - Tubular adenoma.  - Negative for high grade dysplasia.    12/24/2020 Procedure   Capsule endoscopy report: A polypoid nodular growth was noted at 3 hours and 30 minutes and an ulcerated, polypoid growth was noted at 3 hours and 54 minutes which is likely the cause of obscure GI blood loss   01/23/2021 Imaging   CT entero AP w  contrast IMPRESSION: 1. No acute findings identified within the abdomen or pelvis. 2. There are several focal areas of intraluminal hyperenhancement within the small bowel loops. The largest is in the nondilated mid to distal jejunum measuring 1.3 cm. Cannot rule out underlying small bowel neoplasm. Correlation with tissue sampling results advised. Correlation with results from capsule endoscopy. 3. There is a enhancing soft tissue nodule with central calcification within the central small bowel mesentery. This is nonspecific and may represent sequelae of inflammation/infection. Primary differential considerations include carcinoid tumor or metastatic adenopathy. If there is a clinical concern for carcinoid tumor consider further investigation with DOTATATE PET. 4. Left adrenal gland adenoma. 5. Distal colonic diverticulosis  without signs of acute diverticulitis. 6. Aortic atherosclerosis.   02/16/2021 PET scan   IMPRESSION: 1. Evidence of well differentiated small bowel neuroendocrine tumor with mesenteric metastasis. 2. Approximately 7 discrete small bowel lesions with intense radiotracer activity. Lesion depicted on comparison CT enterography. 3. Two intensely radiotracer avid mesenteric implants centrally within the small bowel mesentery. 4. Single hepatic metastasis with a second potential hepatic metastasis versus peritoneal implant. 5. No small bowel obstruction. 6. No evidence of metastatic disease outside the abdomen pelvis. 7. Patient may be a candidate for peptide receptor radiotherapy (Lu-177 DOTATATE) available at [facility] molecular imaging department [phone]).   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 No new or progressive interval findings. 2. Stable appearance of the 17 mm nodule central mesenteric nodule seen to be hypermetabolic on recent PET-CT. The second mesenteric lesion identified on that study is not discernible today. 3. Stable 1.5 cm subcapsular low-density lesion posterior right liver since PET-CT 02/16/2021. 4. 2 small bowel nodules are identified on imaging today although 7 discrete hypermetabolic small bowel lesions were seen on previous PET-CT. 5. Left colonic diverticulosis without diverticulitis. 6. Aortic Atherosclerosis (ICD10-I70.0).   02/22/2022 Imaging   EXAM: CT ABDOMEN AND PELVIS WITH CONTRAST  IMPRESSION: 1. Mural soft tissue nodule of the small bowel in the  midline ventral abdomen is unchanged, as is a small adjacent metastatic mesenteric nodule. 2. No evidence of new metastatic disease in the abdomen or pelvis. 3. Diverticulosis without evidence of acute diverticulitis. 4. Coronary artery disease.   08/25/2023 Imaging   CT chest, abdomen, and pelvis with contrast  IMPRESSION: 1. Increased size of the neuroendocrine metastasis measuring 2.1 cm in the right hepatic lobe. 2. Stable enhancing nonobstructive small bowel lesions. 3. Stable mesenteric nodule. 4. Stable tiny soft tissue nodules in the bilateral posterior gluteal subcutaneous space.       REVIEW OF SYSTEMS:   Constitutional: Denies fevers, chills or abnormal weight loss Eyes: Denies blurriness of vision Ears, nose, mouth, throat, and face: Denies mucositis or sore throat Respiratory: Denies cough, dyspnea or wheezes Cardiovascular: Denies palpitation, chest discomfort or lower extremity swelling Gastrointestinal:  Denies nausea, heartburn or change in bowel habits Skin: Denies abnormal skin rashes Lymphatics: Denies new lymphadenopathy or easy bruising Neurological:Denies numbness, tingling or new weaknesses Behavioral/Psych: Mood is stable, no new changes  All other systems were reviewed with the patient and are negative.   VITALS:  There were no vitals taken for  this visit.  Wt Readings from Last 3 Encounters:  01/20/24 176 lb 12.8 oz (80.2 kg)  01/05/24 176 lb 4.8 oz (80 kg)  12/29/23 177 lb 6.4 oz (80.5 kg)    There is no height or weight on file to calculate BMI.  Performance status (ECOG): {CHL ONC [id number]  PHYSICAL EXAM:   GENERAL:alert, no distress and comfortable SKIN: skin color, texture, turgor are normal, no rashes or significant lesions EYES: normal, Conjunctiva are pink and non-injected, sclera clear OROPHARYNX:no exudate, no erythema and lips, buccal mucosa, and tongue normal  NECK: supple, thyroid  normal size, non-tender, without  nodularity LYMPH:  no palpable lymphadenopathy in the cervical, axillary or inguinal LUNGS: clear to auscultation and percussion with normal breathing effort HEART: regular rate & rhythm and no murmurs and no lower extremity edema ABDOMEN:abdomen soft, non-tender and normal bowel sounds Musculoskeletal:no cyanosis of digits and no clubbing  NEURO: alert & oriented x 3 with fluent speech, no focal motor/sensory deficits  LABORATORY DATA:  I have reviewed the data as listed    Component Value Date/Time   NA 136 02/21/2024 0811   NA 141 09/03/2019 1439   K 3.1 (L) 02/21/2024 0811   CL 103 02/21/2024 0811   CO2 25 02/21/2024 0811   GLUCOSE 197 (H) 02/21/2024 0811   BUN 8 02/21/2024 0811   BUN 12 09/03/2019 1439   CREATININE 0.89 02/21/2024 0811   CREATININE 0.84 01/05/2024 1250   CALCIUM  8.8 (L) 02/21/2024 0811   PROT 7.1 02/21/2024 0811   PROT 6.6 12/25/2019 1005   ALBUMIN 3.7 02/21/2024 0811   ALBUMIN 4.0 12/25/2019 1005   AST 24 02/21/2024 0811   AST 17 01/05/2024 1250   ALT 19 02/21/2024 0811   ALT 12 01/05/2024 1250   ALKPHOS 55 02/21/2024 0811   BILITOT 0.9 02/21/2024 0811   BILITOT 0.5 01/05/2024 1250   GFRNONAA >60 02/21/2024 0811   GFRNONAA >60 01/05/2024 1250   GFRAA >60 03/04/2020 1210    No results found for: SPEP, UPEP  Lab Results  Component Value Date   WBC 3.3 (L) 02/21/2024   NEUTROABS 1.8 02/21/2024   HGB 12.3 02/21/2024   HCT 39.0 02/21/2024   MCV 92.6 02/21/2024   PLT 272 02/21/2024      Chemistry      Component Value Date/Time   NA 136 02/21/2024 0811   NA 141 09/03/2019 1439   K 3.1 (L) 02/21/2024 0811   CL 103 02/21/2024 0811   CO2 25 02/21/2024 0811   BUN 8 02/21/2024 0811   BUN 12 09/03/2019 1439   CREATININE 0.89 02/21/2024 0811   CREATININE 0.84 01/05/2024 1250      Component Value Date/Time   CALCIUM  8.8 (L) 02/21/2024 0811   ALKPHOS 55 02/21/2024 0811   AST 24 02/21/2024 0811   AST 17 01/05/2024 1250   ALT 19 02/21/2024  0811   ALT 12 01/05/2024 1250   BILITOT 0.9 02/21/2024 0811   BILITOT 0.5 01/05/2024 1250       RADIOGRAPHIC STUDIES: I have personally reviewed the radiological images as listed and agreed with the findings in the report. NM LUTATHERA  ADMINISTRATION Result Date: 02/21/2024 CLINICAL DATA:  68 year-old female with metastatic neuroendocrine tumor. Well differentiated tumor with somatostatin receptor is identified within the liver by DOTATATE PET CT scan. EXAM: NUCLEAR MEDICINE LUTATHERA  ADMINISTRATION TECHNIQUE: Infusion: The nuclear medicine technologist and I personally verified the dose activity (207 mCi) to be delivered as specified in the written directive (200 mCi),  and verified the patient identification via 2 separate methods. 20 gauge IV were started in the antecubital veins. Anti-emetics were administered by nursing staff. Amino acid renal protection was initiated 30 minutes prior to Lu 177 DOTATATE (Lutathera ) infusion and continued continuously for 4 hours. Lutathera  infusion was administered over 30 minutes. The total administered dose was 206 mCi Lu 177 DOTATATE. The entire IV tubing, venocatheter, stopcock and syringes was removed in total, placed in a disposal bag and sent for assay of the residual activity, which will be reported at a later time in our EMR by the physics staff. Pressure was applied to the venipuncture sites, and a compression bandage placed. Radiation Safety personnel were present to perform the discharge survey, as detailed on their documentation. Patient received 30 mg IM long-acting Sandostatin  injection 4 hours after Lutathera  effusion in the nuclear medicine department. RADIOPHARMACEUTICALS:  Two hundred six mCi Lu 177 DOTATATE FINDINGS: Diagnosis: Metastatic neuroendocrine tumor. Current Infusion: 2 Planned Infusions: 4 Patient reports severe diarrhea symptoms following therapy. Diarrhea occurred several days after therapy. Patient required hospitalization head  of mild myocardial infarction. Patient's post therapy long-acting Sandostatin  was postponed after initial therapy to coordinate with oncology injections. Plan today is to inject post therapy and continue 4 week cycle. Currently patient's labs are stable. No myelosuppression. Normal renal function. The patient's most recent blood counts were reviewed and remains a good candidate to proceed with Lutathera . The patient was situated in an infusion suite and administered Lutathera  as above. Approximately 2-3 hours after infusion of Lu 177 Dotatate, the patient experienced a rapid onset of diarrhea. Patient also felt faint and required aid in walking. Patient was mildly hypotensive. Patient was mildly hypoglycemic (relative hypoglycemia). Patient was administered 500 mcg short-acting octreotide . Additionally patient was hydrated with additional IV fluids as well as given snacks. Patient stabilized over the next hour and was able to be a discharged on her record. Patient was advised if symptoms return or worsen to report to ED. Patient will follow-up with referring oncologist for interval serum laboratories (CBC and CMP) in approximately 4 weeks. Patient received 30 mg IM long-acting Sandostatin  injection 4 hours after Lutathera  effusion in the nuclear medicine department. IMPRESSION: Second Lu 177 DOTATATE treatment for metastatic neuroendocrine tumor. Patient has significant episodes of adverse reaction as described above. Oncologist consulted on potential cessation of treatment after 2 therapies. Electronically Signed   By: Deboraha Fallow M.D.   On: 02/21/2024 16:38

## 2024-02-29 NOTE — Assessment & Plan Note (Deleted)
 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  Liver biopsy was attempted, but the liver lesion was not visible on ultrasound. -Tanya Walsh started Sandostatin  injection in June 2022.  Overall tolerating well. Will continue.  -last restaging CT on 09/07/2022 showed stable disease  -Tanya Walsh is clinically doing well overall, still has intermittent pain on the left side of abdomen, overall better than last year. -Tanya Walsh has developed hyperglycemia, possibly related to Sandostatin  injection, Tanya Walsh is on metformin  now.  Discussed diabetic diet, and I encouraged her to exercise. -Restaging CT scan from February 23, 2023 showed stable disease -CT in 08/2023 and dotatate PET scan in 09/2023 showed progression in liver  -Tanya Walsh started Lutathera  therapy ON 12/27/2023 - Received Sandostatin  injection on 01/05/2024 and 02/02/2024.  Scheduled for Sandostatin  today, 03/01/2024.

## 2024-03-01 ENCOUNTER — Inpatient Hospital Stay

## 2024-03-01 ENCOUNTER — Inpatient Hospital Stay: Admitting: Nurse Practitioner

## 2024-03-01 ENCOUNTER — Other Ambulatory Visit: Payer: Self-pay

## 2024-03-01 DIAGNOSIS — C7A8 Other malignant neuroendocrine tumors: Secondary | ICD-10-CM

## 2024-03-02 ENCOUNTER — Ambulatory Visit (HOSPITAL_COMMUNITY)

## 2024-03-05 ENCOUNTER — Telehealth (HOSPITAL_COMMUNITY): Payer: Self-pay

## 2024-03-05 ENCOUNTER — Ambulatory Visit (HOSPITAL_COMMUNITY)

## 2024-03-05 NOTE — Telephone Encounter (Signed)
 Called patient to confirm Cardiac Rehab appointment tomorrow. RN completed Nursing Assessment with patient. Directions and instructions provided for the appointment. Pt understands without assistance.

## 2024-03-05 NOTE — Telephone Encounter (Signed)
 LVM for patient to confirm Cardiac Rehab appointment tomorrow.

## 2024-03-06 ENCOUNTER — Encounter (HOSPITAL_COMMUNITY)
Admission: RE | Admit: 2024-03-06 | Discharge: 2024-03-06 | Disposition: A | Discharge status: Home / Self Care | Source: Ambulatory Visit | Attending: Cardiology | Admitting: Cardiology

## 2024-03-06 ENCOUNTER — Ambulatory Visit: Payer: Self-pay | Admitting: Cardiology

## 2024-03-06 VITALS — BP 130/80 | HR 67 | Ht 63.0 in | Wt 180.8 lb

## 2024-03-06 DIAGNOSIS — Z48812 Encounter for surgical aftercare following surgery on the circulatory system: Secondary | ICD-10-CM | POA: Diagnosis not present

## 2024-03-06 DIAGNOSIS — I252 Old myocardial infarction: Secondary | ICD-10-CM | POA: Insufficient documentation

## 2024-03-06 DIAGNOSIS — I214 Non-ST elevation (NSTEMI) myocardial infarction: Secondary | ICD-10-CM

## 2024-03-06 LAB — GLUCOSE, CAPILLARY: Glucose-Capillary: 117 mg/dL — ABNORMAL HIGH (ref 70–99)

## 2024-03-06 NOTE — Progress Notes (Signed)
 Cardiac Rehab Medication Review   Does the patient  feel that his/her medications are working for him/her?  yes  Has the patient been experiencing any side effects to the medications prescribed?  no  Does the patient measure his/her own blood pressure or blood glucose at home?  yes   Does the patient have any problems obtaining medications due to transportation or finances?   no  Understanding of regimen: excellent Understanding of indications: excellent Potential of compliance: excellent    Comments: Pt checks blood pressure at home. Pt has no questions regarding her medications today.    Alm Parkins 03/06/2024 1:33 PM

## 2024-03-06 NOTE — Progress Notes (Signed)
 Cardiac Individual Treatment Plan  Patient Details  Name: Tanya Walsh MRN: [id number] Date of Birth: 1955-11-19 Referring Provider:   Flowsheet Row INTENSIVE CARDIAC REHAB ORIENT from 03/06/2024 in [facility]  Referring Provider Wilbert Bihari, MD    Initial Encounter Date:  Flowsheet Row INTENSIVE CARDIAC REHAB ORIENT from 03/06/2024 in [facility]  Date 03/06/24    Visit Diagnosis: 12/29/23 NSTEMI (non-ST elevated myocardial infarction) [facility])  Patient's Home Medications on Admission:  Current Outpatient Medications:    acetaminophen  (TYLENOL ) 650 MG CR tablet, Take 650 mg by mouth every 8 (eight) hours as needed for pain., Disp: , Rfl:    aspirin  EC 81 MG tablet, Take 1 tablet (81 mg total) by mouth daily. Swallow whole., Disp: 90 tablet, Rfl: 0   clopidogrel  (PLAVIX ) 75 MG tablet, Take 1 tablet (75 mg total) by mouth daily., Disp: 90 tablet, Rfl: 0   Cyanocobalamin  1000 MCG LOZG, Take 1,000 mcg by mouth in the morning., Disp: , Rfl:    cyclobenzaprine  (FLEXERIL ) 5 MG tablet, Take 1 tablet (5 mg total) by mouth 3 (three) times daily as needed for muscle spasms., Disp: 30 tablet, Rfl: 0   DULoxetine  (CYMBALTA ) 60 MG capsule, Take 60 mg by mouth in the morning., Disp: , Rfl:    fluticasone (FLONASE) 50 MCG/ACT nasal spray, Place 1 spray into both nostrils as needed for allergies or rhinitis., Disp: , Rfl:    folic acid  (FOLVITE ) 800 MCG tablet, Take 400 mcg by mouth in the morning., Disp: , Rfl:    gabapentin  (NEURONTIN ) 100 MG capsule, Take 1 capsule (100 mg total) by mouth at bedtime., Disp: 30 capsule, Rfl: 5   isosorbide  mononitrate (IMDUR ) 30 MG 24 hr tablet, Take 0.5 tablets (15 mg total) by mouth daily. Can take a whole tablet if needed, Disp: , Rfl:    losartan -hydrochlorothiazide  (HYZAAR) 50-12.5 MG tablet, Take 1 tablet by mouth in the morning., Disp: , Rfl:     metFORMIN  (GLUCOPHAGE -XR) 500 MG 24 hr tablet, Take 500 mg by mouth daily with breakfast., Disp: , Rfl:    Multiple Vitamins-Minerals (MULTIVITAMIN ADULTS 50+) TABS, Take 1 tablet by mouth daily., Disp: , Rfl:    nitroGLYCERIN  (NITROSTAT ) 0.4 MG SL tablet, Place 1 tablet (0.4 mg total) under the tongue every 5 (five) minutes as needed for chest pain., Disp: 30 tablet, Rfl: 0   ondansetron  (ZOFRAN ) 8 MG tablet, Take 1 tablet (8 mg total) by mouth 2 (two) times daily as needed for nausea or vomiting., Disp: 20 tablet, Rfl: 0   pantoprazole  (PROTONIX ) 40 MG tablet, Take 40 mg by mouth in the morning., Disp: , Rfl:    rosuvastatin  (CRESTOR ) 20 MG tablet, Take 20 mg by mouth in the morning., Disp: , Rfl:    traMADol  (ULTRAM ) 50 MG tablet, Take 1 tablet (50 mg total) by mouth every 12 (twelve) hours as needed., Disp: 20 tablet, Rfl: 0   trolamine salicylate (ASPERCREME) 10 % cream, Apply 1 application topically as needed for muscle pain., Disp: , Rfl:   Past Medical History: Past Medical History:  Diagnosis Date   Coronary artery disease    Depression    GI bleed 07/17/2015   Hypertension    Myocardial infarction [facility])    Nocturia 08/14/2019   Refusal of blood transfusions as patient is Jehovah's Witness     Tobacco Use: Social History   Tobacco Use  Smoking Status Never  Smokeless Tobacco Never    Labs: Review Flowsheet  More data exists      Latest Ref Rng & Units 04/08/2010 12/25/2019 08/29/2022 12/29/2023 12/31/2023  Labs for ITP Cardiac and Pulmonary Rehab  Cholestrol 0 - 200 mg/dL - 854  - - 882   LDL (calc) 0 - 99 mg/dL - 71  - - 52   HDL-C >59 mg/dL - 61  - - 53   Trlycerides <150 mg/dL - 62  - - 61   Hemoglobin A1c 4.8 - 5.6 % - - - 7.5  -  TCO2 22 - 32 mmol/L 25  - 27  22  -    Capillary Blood Glucose: Lab Results  Component Value Date   GLUCAP 117 (H) 03/06/2024   GLUCAP 119 (H) 02/21/2024   GLUCAP 75 02/21/2024   GLUCAP 122 (H) 12/31/2023   GLUCAP 123 (H)  12/30/2023     Exercise Target Goals: Exercise Program Goal: Individual exercise prescription set using results from initial 6 min walk test and THRR while considering  patient's activity barriers and safety.   Exercise Prescription Goal: Initial exercise prescription builds to 30-45 minutes a day of aerobic activity, 2-3 days per week.  Home exercise guidelines will be given to patient during program as part of exercise prescription that the participant will acknowledge.  Activity Barriers & Risk Stratification:  Activity Barriers & Cardiac Risk Stratification - 03/06/24 1340       Activity Barriers & Cardiac Risk Stratification   Activity Barriers Back Problems;Deconditioning;Balance Concerns    Cardiac Risk Stratification High          6 Minute Walk:  6 Minute Walk     Row Name 03/06/24 1534         6 Minute Walk   Phase Initial     Distance 1308 feet     Walk Time 6 minutes     # of Rest Breaks 0     MPH 2.5     METS 2.6     RPE 11     Perceived Dyspnea  1     VO2 Peak 9.03     Symptoms Yes (comment)     Comments Mild SOB, RPD =1; low back pain 7/10, rt hip pain 4/10     Resting HR 64 bpm     Resting BP 130/78     Resting Oxygen Saturation  95 %     Exercise Oxygen Saturation  during 6 min walk 98 %     Max Ex. HR 95 bpm     Max Ex. BP 134/80        Oxygen Initial Assessment:   Oxygen Re-Evaluation:   Oxygen Discharge (Final Oxygen Re-Evaluation):   Initial Exercise Prescription:  Initial Exercise Prescription - 03/06/24 1500       Date of Initial Exercise RX and Referring Provider   Date 03/06/24    Referring Provider Wilbert Bihari, MD    Expected Discharge Date 05/30/24      NuStep   Level 2    SPM 75    Minutes 30    METs 2.6      Prescription Details   Frequency (times per week) 3    Duration Progress to 30 minutes of continuous aerobic without signs/symptoms of physical distress      Intensity   THRR 40-80% of Max Heartrate  60-120    Ratings of Perceived Exertion 11-13    Perceived Dyspnea 0-4  Progression   Progression Continue progressive overload as per policy without signs/symptoms or physical distress.      Resistance Training   Training Prescription Yes    Weight 3lbs    Reps 10-15          Perform Capillary Blood Glucose checks as needed.  Exercise Prescription Changes:   Exercise Comments:   Exercise Goals and Review:   Exercise Goals     Row Name 03/06/24 1339             Exercise Goals   Increase Physical Activity Yes       Intervention Provide advice, education, support and counseling about physical activity/exercise needs.;Develop an individualized exercise prescription for aerobic and resistive training based on initial evaluation findings, risk stratification, comorbidities and participant's personal goals.       Expected Outcomes Short Term: Attend rehab on a regular basis to increase amount of physical activity.;Long Term: Add in home exercise to make exercise part of routine and to increase amount of physical activity.;Long Term: Exercising regularly at least 3-5 days a week.       Increase Strength and Stamina Yes       Intervention Provide advice, education, support and counseling about physical activity/exercise needs.;Develop an individualized exercise prescription for aerobic and resistive training based on initial evaluation findings, risk stratification, comorbidities and participant's personal goals.       Expected Outcomes Short Term: Increase workloads from initial exercise prescription for resistance, speed, and METs.;Short Term: Perform resistance training exercises routinely during rehab and add in resistance training at home;Long Term: Improve cardiorespiratory fitness, muscular endurance and strength as measured by increased METs and functional capacity ( )       Able to understand and use rate of perceived exertion (RPE) scale Yes       Intervention  Provide education and explanation on how to use RPE scale       Expected Outcomes Short Term: Able to use RPE daily in rehab to express subjective intensity level;Long Term:  Able to use RPE to guide intensity level when exercising independently       Knowledge and understanding of Target Heart Rate Range (THRR) Yes       Intervention Provide education and explanation of THRR including how the numbers were predicted and where they are located for reference       Expected Outcomes Short Term: Able to state/look up THRR;Short Term: Able to use daily as guideline for intensity in rehab;Long Term: Able to use THRR to govern intensity when exercising independently       Understanding of Exercise Prescription Yes       Intervention Provide education, explanation, and written materials on patient's individual exercise prescription       Expected Outcomes Short Term: Able to explain program exercise prescription;Long Term: Able to explain home exercise prescription to exercise independently          Exercise Goals Re-Evaluation :   Discharge Exercise Prescription (Final Exercise Prescription Changes):   Nutrition:  Target Goals: Understanding of nutrition guidelines, daily intake of sodium 1500mg , cholesterol 200mg , calories 30% from fat and 7% or less from saturated fats, daily to have 5 or more servings of fruits and vegetables.  Biometrics:  Pre Biometrics - 03/06/24 1400       Pre Biometrics   Waist Circumference 39.5 inches    Hip Circumference 47 inches    Waist to Hip Ratio 0.84 %    Triceps Skinfold 24 mm    %  Body Fat 42.4 %    Grip Strength 12 kg    Flexibility --   Not performed, active chronic low back pain 4/10   Single Leg Stand 2 seconds           Nutrition Therapy Plan and Nutrition Goals:   Nutrition Assessments:  MEDIFICTS Score Key: >=70 Need to make dietary changes  40-70 Heart Healthy Diet <= 40 Therapeutic Level Cholesterol Diet    Picture Your Plate  Scores: <59 Unhealthy dietary pattern with much room for improvement. 41-50 Dietary pattern unlikely to meet recommendations for good health and room for improvement. 51-60 More healthful dietary pattern, with some room for improvement.  >60 Healthy dietary pattern, although there may be some specific behaviors that could be improved.    Nutrition Goals Re-Evaluation:   Nutrition Goals Re-Evaluation:   Nutrition Goals Discharge (Final Nutrition Goals Re-Evaluation):   Psychosocial: Target Goals: Acknowledge presence or absence of significant depression and/or stress, maximize coping skills, provide positive support system. Participant is able to verbalize types and ability to use techniques and skills needed for reducing stress and depression.  Initial Review & Psychosocial Screening:  Initial Psych Review & Screening - 03/06/24 1449       Initial Review   Current issues with Current Depression;Current Psychotropic Meds;History of Depression;Current Stress Concerns    Source of Stress Concerns Chronic Illness;Unable to participate in former interests or hobbies    Comments Pt attributes some of this to her CA diagnosis/treatment      Family Dynamics   Good Support System? Yes   Pt has brothers and sisters and her church community     Barriers   Psychosocial barriers to participate in program The patient should benefit from training in stress management and relaxation.      Screening Interventions   Interventions Encouraged to exercise    Expected Outcomes Short Term goal: Utilizing psychosocial counselor, staff and physician to assist with identification of specific Stressors or current issues interfering with healing process. Setting desired goal for each stressor or current issue identified.;Long Term Goal: Stressors or current issues are controlled or eliminated.;Short Term goal: Identification and review with participant of any Quality of Life or Depression concerns found by  scoring the questionnaire.;Long Term goal: The participant improves quality of Life and PHQ9 Scores as seen by post scores and/or verbalization of changes          Quality of Life Scores:  Quality of Life - 03/06/24 1445       Quality of Life   Select Quality of Life      Quality of Life Scores   Health/Function Pre 19.19 %    Socioeconomic Pre 28 %    Psych/Spiritual Pre 30 %    Family Pre 27 %    GLOBAL Pre 24.02 %         Scores of 19 and below usually indicate a poorer quality of life in these areas.  A difference of  2-3 points is a clinically meaningful difference.  A difference of 2-3 points in the total score of the Quality of Life Index has been associated with significant improvement in overall quality of life, self-image, physical symptoms, and general health in studies assessing change in quality of life.  PHQ-9: Review Flowsheet       03/06/2024  Depression screen PHQ 2/9  Decreased Interest 2  Down, Depressed, Hopeless 2  PHQ - 2 Score 4  Altered sleeping 0  Tired, decreased energy 1  Change in appetite 2  Feeling bad or failure about yourself  0  Trouble concentrating 0  Moving slowly or fidgety/restless 1  Suicidal thoughts 0  PHQ-9 Score 8  Difficult doing work/chores Somewhat difficult   Interpretation of Total Score  Total Score Depression Severity:  1-4 = Minimal depression, 5-9 = Mild depression, 10-14 = Moderate depression, 15-19 = Moderately severe depression, 20-27 = Severe depression   Psychosocial Evaluation and Intervention:   Psychosocial Re-Evaluation:   Psychosocial Discharge (Final Psychosocial Re-Evaluation):   Vocational Rehabilitation: Provide vocational rehab assistance to qualifying candidates.   Vocational Rehab Evaluation & Intervention:  Vocational Rehab - 03/06/24 1341       Initial Vocational Rehab Evaluation & Intervention   Assessment shows need for Vocational Rehabilitation No   Retired          Education: Education Goals: Education classes will be provided on a weekly basis, covering required topics. Participant will state understanding/return demonstration of topics presented.     Core Videos: Exercise    Move It!  Clinical staff conducted group or individual video education with verbal and written material and guidebook.  Patient learns the recommended Pritikin exercise program. Exercise with the goal of living a long, healthy life. Some of the health benefits of exercise include controlled diabetes, healthier blood pressure levels, improved cholesterol levels, improved heart and lung capacity, improved sleep, and better body composition. Everyone should speak with their doctor before starting or changing an exercise routine.  Biomechanical Limitations Clinical staff conducted group or individual video education with verbal and written material and guidebook.  Patient learns how biomechanical limitations can impact exercise and how we can mitigate and possibly overcome limitations to have an impactful and balanced exercise routine.  Body Composition Clinical staff conducted group or individual video education with verbal and written material and guidebook.  Patient learns that body composition (ratio of muscle mass to fat mass) is a key component to assessing overall fitness, rather than body weight alone. Increased fat mass, especially visceral belly fat, can put us  at increased risk for metabolic syndrome, type 2 diabetes, heart disease, and even death. It is recommended to combine diet and exercise (cardiovascular and resistance training) to improve your body composition. Seek guidance from your physician and exercise physiologist before implementing an exercise routine.  Exercise Action Plan Clinical staff conducted group or individual video education with verbal and written material and guidebook.  Patient learns the recommended strategies to achieve and enjoy long-term  exercise adherence, including variety, self-motivation, self-efficacy, and positive decision making. Benefits of exercise include fitness, good health, weight management, more energy, better sleep, less stress, and overall well-being.  Medical   Heart Disease Risk Reduction Clinical staff conducted group or individual video education with verbal and written material and guidebook.  Patient learns our heart is our most vital organ as it circulates oxygen, nutrients, white blood cells, and hormones throughout the entire body, and carries waste away. Data supports a plant-based eating plan like the Pritikin Program for its effectiveness in slowing progression of and reversing heart disease. The video provides a number of recommendations to address heart disease.   Metabolic Syndrome and Belly Fat  Clinical staff conducted group or individual video education with verbal and written material and guidebook.  Patient learns what metabolic syndrome is, how it leads to heart disease, and how one can reverse it and keep it from coming back. You have metabolic syndrome if you have 3 of the following 5 criteria: abdominal  obesity, high blood pressure, high triglycerides, low HDL cholesterol, and high blood sugar.  Hypertension and Heart Disease Clinical staff conducted group or individual video education with verbal and written material and guidebook.  Patient learns that high blood pressure, or hypertension, is very common in the [place] . Hypertension is largely due to excessive salt intake, but other important risk factors include being overweight, physical inactivity, drinking too much alcohol, smoking, and not eating enough potassium from fruits and vegetables. High blood pressure is a leading risk factor for heart attack, stroke, congestive heart failure, dementia, kidney failure, and premature death. Long-term effects of excessive salt intake include stiffening of the arteries and thickening of heart  muscle and organ damage. Recommendations include ways to reduce hypertension and the risk of heart disease.  Diseases of Our Time - Focusing on Diabetes Clinical staff conducted group or individual video education with verbal and written material and guidebook.  Patient learns why the best way to stop diseases of our time is prevention, through food and other lifestyle changes. Medicine (such as prescription pills and surgeries) is often only a Band-Aid on the problem, not a long-term solution. Most common diseases of our time include obesity, type 2 diabetes, hypertension, heart disease, and cancer. The Pritikin Program is recommended and has been proven to help reduce, reverse, and/or prevent the damaging effects of metabolic syndrome.  Nutrition   Overview of the Pritikin Eating Plan  Clinical staff conducted group or individual video education with verbal and written material and guidebook.  Patient learns about the Pritikin Eating Plan for disease risk reduction. The Pritikin Eating Plan emphasizes a wide variety of unrefined, minimally-processed carbohydrates, like fruits, vegetables, whole grains, and legumes. Go, Caution, and Stop food choices are explained. Plant-based and lean animal proteins are emphasized. Rationale provided for low sodium intake for blood pressure control, low added sugars for blood sugar stabilization, and low added fats and oils for coronary artery disease risk reduction and weight management.  Calorie Density  Clinical staff conducted group or individual video education with verbal and written material and guidebook.  Patient learns about calorie density and how it impacts the Pritikin Eating Plan. Knowing the characteristics of the food you choose will help you decide whether those foods will lead to weight gain or weight loss, and whether you want to consume more or less of them. Weight loss is usually a side effect of the Pritikin Eating Plan because of its focus on  low calorie-dense foods.  Label Reading  Clinical staff conducted group or individual video education with verbal and written material and guidebook.  Patient learns about the Pritikin recommended label reading guidelines and corresponding recommendations regarding calorie density, added sugars, sodium content, and whole grains.  Dining Out - Part 1  Clinical staff conducted group or individual video education with verbal and written material and guidebook.  Patient learns that restaurant meals can be sabotaging because they can be so high in calories, fat, sodium, and/or sugar. Patient learns recommended strategies on how to positively address this and avoid unhealthy pitfalls.  Facts on Fats  Clinical staff conducted group or individual video education with verbal and written material and guidebook.  Patient learns that lifestyle modifications can be just as effective, if not more so, as many medications for lowering your risk of heart disease. A Pritikin lifestyle can help to reduce your risk of inflammation and atherosclerosis (cholesterol build-up, or plaque, in the artery walls). Lifestyle interventions such as dietary choices and physical  activity address the cause of atherosclerosis. A review of the types of fats and their impact on blood cholesterol levels, along with dietary recommendations to reduce fat intake is also included.  Nutrition Action Plan  Clinical staff conducted group or individual video education with verbal and written material and guidebook.  Patient learns how to incorporate Pritikin recommendations into their lifestyle. Recommendations include planning and keeping personal health goals in mind as an important part of their success.  Healthy Mind-Set    Healthy Minds, Bodies, Hearts  Clinical staff conducted group or individual video education with verbal and written material and guidebook.  Patient learns how to identify when they are stressed. Video will discuss  the impact of that stress, as well as the many benefits of stress management. Patient will also be introduced to stress management techniques. The way we think, act, and feel has an impact on our hearts.  How Our Thoughts Can Heal Our Hearts  Clinical staff conducted group or individual video education with verbal and written material and guidebook.  Patient learns that negative thoughts can cause depression and anxiety. This can result in negative lifestyle behavior and serious health problems. Cognitive behavioral therapy is an effective method to help control our thoughts in order to change and improve our emotional outlook.  Additional Videos:  Exercise    Improving Performance  Clinical staff conducted group or individual video education with verbal and written material and guidebook.  Patient learns to use a non-linear approach by alternating intensity levels and lengths of time spent exercising to help burn more calories and lose more body fat. Cardiovascular exercise helps improve heart health, metabolism, hormonal balance, blood sugar control, and recovery from fatigue. Resistance training improves strength, endurance, balance, coordination, reaction time, metabolism, and muscle mass. Flexibility exercise improves circulation, posture, and balance. Seek guidance from your physician and exercise physiologist before implementing an exercise routine and learn your capabilities and proper form for all exercise.  Introduction to Yoga  Clinical staff conducted group or individual video education with verbal and written material and guidebook.  Patient learns about yoga, a discipline of the coming together of mind, breath, and body. The benefits of yoga include improved flexibility, improved range of motion, better posture and core strength, increased lung function, weight loss, and positive self-image. Yoga's heart health benefits include lowered blood pressure, healthier heart rate, decreased  cholesterol and triglyceride levels, improved immune function, and reduced stress. Seek guidance from your physician and exercise physiologist before implementing an exercise routine and learn your capabilities and proper form for all exercise.  Medical   Aging: Enhancing Your Quality of Life  Clinical staff conducted group or individual video education with verbal and written material and guidebook.  Patient learns key strategies and recommendations to stay in good physical health and enhance quality of life, such as prevention strategies, having an advocate, securing a Health Care Proxy and Power of Attorney, and keeping a list of medications and system for tracking them. It also discusses how to avoid risk for bone loss.  Biology of Weight Control  Clinical staff conducted group or individual video education with verbal and written material and guidebook.  Patient learns that weight gain occurs because we consume more calories than we burn (eating more, moving less). Even if your body weight is normal, you may have higher ratios of fat compared to muscle mass. Too much body fat puts you at increased risk for cardiovascular disease, heart attack, stroke, type 2 diabetes, and obesity-related  cancers. In addition to exercise, following the Pritikin Eating Plan can help reduce your risk.  Decoding Lab Results  Clinical staff conducted group or individual video education with verbal and written material and guidebook.  Patient learns that lab test reflects one measurement whose values change over time and are influenced by many factors, including medication, stress, sleep, exercise, food, hydration, pre-existing medical conditions, and more. It is recommended to use the knowledge from this video to become more involved with your lab results and evaluate your numbers to speak with your doctor.   Diseases of Our Time - Overview  Clinical staff conducted group or individual video education with verbal  and written material and guidebook.  Patient learns that according to the CDC, 50% to 70% of chronic diseases (such as obesity, type 2 diabetes, elevated lipids, hypertension, and heart disease) are avoidable through lifestyle improvements including healthier food choices, listening to satiety cues, and increased physical activity.  Sleep Disorders Clinical staff conducted group or individual video education with verbal and written material and guidebook.  Patient learns how good quality and duration of sleep are important to overall health and well-being. Patient also learns about sleep disorders and how they impact health along with recommendations to address them, including discussing with a physician.  Nutrition  Dining Out - Part 2 Clinical staff conducted group or individual video education with verbal and written material and guidebook.  Patient learns how to plan ahead and communicate in order to maximize their dining experience in a healthy and nutritious manner. Included are recommended food choices based on the type of restaurant the patient is visiting.   Fueling a Banker conducted group or individual video education with verbal and written material and guidebook.  There is a strong connection between our food choices and our health. Diseases like obesity and type 2 diabetes are very prevalent and are in large-part due to lifestyle choices. The Pritikin Eating Plan provides plenty of food and hunger-curbing satisfaction. It is easy to follow, affordable, and helps reduce health risks.  Menu Workshop  Clinical staff conducted group or individual video education with verbal and written material and guidebook.  Patient learns that restaurant meals can sabotage health goals because they are often packed with calories, fat, sodium, and sugar. Recommendations include strategies to plan ahead and to communicate with the manager, chef, or server to help order a healthier  meal.  Planning Your Eating Strategy  Clinical staff conducted group or individual video education with verbal and written material and guidebook.  Patient learns about the Pritikin Eating Plan and its benefit of reducing the risk of disease. The Pritikin Eating Plan does not focus on calories. Instead, it emphasizes high-quality, nutrient-rich foods. By knowing the characteristics of the foods, we choose, we can determine their calorie density and make informed decisions.  Targeting Your Nutrition Priorities  Clinical staff conducted group or individual video education with verbal and written material and guidebook.  Patient learns that lifestyle habits have a tremendous impact on disease risk and progression. This video provides eating and physical activity recommendations based on your personal health goals, such as reducing LDL cholesterol, losing weight, preventing or controlling type 2 diabetes, and reducing high blood pressure.  Vitamins and Minerals  Clinical staff conducted group or individual video education with verbal and written material and guidebook.  Patient learns different ways to obtain key vitamins and minerals, including through a recommended healthy diet. It is important to discuss all supplements  you take with your doctor.   Healthy Mind-Set    Smoking Cessation  Clinical staff conducted group or individual video education with verbal and written material and guidebook.  Patient learns that cigarette smoking and tobacco addiction pose a serious health risk which affects millions of people. Stopping smoking will significantly reduce the risk of heart disease, lung disease, and many forms of cancer. Recommended strategies for quitting are covered, including working with your doctor to develop a successful plan.  Culinary   Becoming a Set designer conducted group or individual video education with verbal and written material and guidebook.  Patient learns  that cooking at home can be healthy, cost-effective, quick, and puts them in control. Keys to cooking healthy recipes will include looking at your recipe, assessing your equipment needs, planning ahead, making it simple, choosing cost-effective seasonal ingredients, and limiting the use of added fats, salts, and sugars.  Cooking - Breakfast and Snacks  Clinical staff conducted group or individual video education with verbal and written material and guidebook.  Patient learns how important breakfast is to satiety and nutrition through the entire day. Recommendations include key foods to eat during breakfast to help stabilize blood sugar levels and to prevent overeating at meals later in the day. Planning ahead is also a key component.  Cooking - Educational psychologist conducted group or individual video education with verbal and written material and guidebook.  Patient learns eating strategies to improve overall health, including an approach to cook more at home. Recommendations include thinking of animal protein as a side on your plate rather than center stage and focusing instead on lower calorie dense options like vegetables, fruits, whole grains, and plant-based proteins, such as beans. Making sauces in large quantities to freeze for later and leaving the skin on your vegetables are also recommended to maximize your experience.  Cooking - Healthy Salads and Dressing Clinical staff conducted group or individual video education with verbal and written material and guidebook.  Patient learns that vegetables, fruits, whole grains, and legumes are the foundations of the Pritikin Eating Plan. Recommendations include how to incorporate each of these in flavorful and healthy salads, and how to create homemade salad dressings. Proper handling of ingredients is also covered. Cooking - Soups and [employer] - Soups and Desserts Clinical staff conducted group or individual video education with  verbal and written material and guidebook.  Patient learns that Pritikin soups and desserts make for easy, nutritious, and delicious snacks and meal components that are low in sodium, fat, sugar, and calorie density, while high in vitamins, minerals, and filling fiber. Recommendations include simple and healthy ideas for soups and desserts.   Overview     The Pritikin Solution Program Overview Clinical staff conducted group or individual video education with verbal and written material and guidebook.  Patient learns that the results of the Pritikin Program have been documented in more than 100 articles published in peer-reviewed journals, and the benefits include reducing risk factors for (and, in some cases, even reversing) high cholesterol, high blood pressure, type 2 diabetes, obesity, and more! An overview of the three key pillars of the Pritikin Program will be covered: eating well, doing regular exercise, and having a healthy mind-set.  WORKSHOPS  Exercise: Exercise Basics: Building Your Action Plan Clinical staff led group instruction and group discussion with PowerPoint presentation and patient guidebook. To enhance the learning environment the use of posters, models and videos may be added.  At the conclusion of this workshop, patients will comprehend the difference between physical activity and exercise, as well as the benefits of incorporating both, into their routine. Patients will understand the FITT (Frequency, Intensity, Time, and Type) principle and how to use it to build an exercise action plan. In addition, safety concerns and other considerations for exercise and cardiac rehab will be addressed by the presenter. The purpose of this lesson is to promote a comprehensive and effective weekly exercise routine in order to improve patients' overall level of fitness.   Managing Heart Disease: Your Path to a Healthier Heart Clinical staff led group instruction and group discussion with  PowerPoint presentation and patient guidebook. To enhance the learning environment the use of posters, models and videos may be added.At the conclusion of this workshop, patients will understand the anatomy and physiology of the heart. Additionally, they will understand how Pritikin's three pillars impact the risk factors, the progression, and the management of heart disease.  The purpose of this lesson is to provide a high-level overview of the heart, heart disease, and how the Pritikin lifestyle positively impacts risk factors.  Exercise Biomechanics Clinical staff led group instruction and group discussion with PowerPoint presentation and patient guidebook. To enhance the learning environment the use of posters, models and videos may be added. Patients will learn how the structural parts of their bodies function and how these functions impact their daily activities, movement, and exercise. Patients will learn how to promote a neutral spine, learn how to manage pain, and identify ways to improve their physical movement in order to promote healthy living. The purpose of this lesson is to expose patients to common physical limitations that impact physical activity. Participants will learn practical ways to adapt and manage aches and pains, and to minimize their effect on regular exercise. Patients will learn how to maintain good posture while sitting, walking, and lifting.  Balance Training and Fall Prevention  Clinical staff led group instruction and group discussion with PowerPoint presentation and patient guidebook. To enhance the learning environment the use of posters, models and videos may be added. At the conclusion of this workshop, patients will understand the importance of their sensorimotor skills (vision, proprioception, and the vestibular system) in maintaining their ability to balance as they age. Patients will apply a variety of balancing exercises that are appropriate for their  current level of function. Patients will understand the common causes for poor balance, possible solutions to these problems, and ways to modify their physical environment in order to minimize their fall risk. The purpose of this lesson is to teach patients about the importance of maintaining balance as they age and ways to minimize their risk of falling.  WORKSHOPS   Nutrition:  Fueling a Ship broker led group instruction and group discussion with PowerPoint presentation and patient guidebook. To enhance the learning environment the use of posters, models and videos may be added. Patients will review the foundational principles of the Pritikin Eating Plan and understand what constitutes a serving size in each of the food groups. Patients will also learn Pritikin-friendly foods that are better choices when away from home and review make-ahead meal and snack options. Calorie density will be reviewed and applied to three nutrition priorities: weight maintenance, weight loss, and weight gain. The purpose of this lesson is to reinforce (in a group setting) the key concepts around what patients are recommended to eat and how to apply these guidelines when away from home by planning and  selecting Pritikin-friendly options. Patients will understand how calorie density may be adjusted for different weight management goals.  Mindful Eating  Clinical staff led group instruction and group discussion with PowerPoint presentation and patient guidebook. To enhance the learning environment the use of posters, models and videos may be added. Patients will briefly review the concepts of the Pritikin Eating Plan and the importance of low-calorie dense foods. The concept of mindful eating will be introduced as well as the importance of paying attention to internal hunger signals. Triggers for non-hunger eating and techniques for dealing with triggers will be explored. The purpose of this lesson is to provide  patients with the opportunity to review the basic principles of the Pritikin Eating Plan, discuss the value of eating mindfully and how to measure internal cues of hunger and fullness using the Hunger Scale. Patients will also discuss reasons for non-hunger eating and learn strategies to use for controlling emotional eating.  Targeting Your Nutrition Priorities Clinical staff led group instruction and group discussion with PowerPoint presentation and patient guidebook. To enhance the learning environment the use of posters, models and videos may be added. Patients will learn how to determine their genetic susceptibility to disease by reviewing their family history. Patients will gain insight into the importance of diet as part of an overall healthy lifestyle in mitigating the impact of genetics and other environmental insults. The purpose of this lesson is to provide patients with the opportunity to assess their personal nutrition priorities by looking at their family history, their own health history and current risk factors. Patients will also be able to discuss ways of prioritizing and modifying the Pritikin Eating Plan for their highest risk areas  Menu  Clinical staff led group instruction and group discussion with PowerPoint presentation and patient guidebook. To enhance the learning environment the use of posters, models and videos may be added. Using menus brought in from [employer], or printed from [employer], patients will apply the Pritikin dining out guidelines that were presented in the [employer] video. Patients will also be able to practice these guidelines in a variety of provided scenarios. The purpose of this lesson is to provide patients with the opportunity to practice hands-on learning of the Pritikin Dining Out guidelines with actual menus and practice scenarios.  Label Reading Clinical staff led group instruction and group discussion with PowerPoint  presentation and patient guidebook. To enhance the learning environment the use of posters, models and videos may be added. Patients will review and discuss the Pritikin label reading guidelines presented in Pritikin's Label Reading Educational series video. Using fool labels brought in from local grocery stores and markets, patients will apply the label reading guidelines and determine if the packaged food meet the Pritikin guidelines. The purpose of this lesson is to provide patients with the opportunity to review, discuss, and practice hands-on learning of the Pritikin Label Reading guidelines with actual packaged food labels. Cooking School  Pritikin's [employer] are designed to teach patients ways to prepare quick, simple, and affordable recipes at home. The importance of nutrition's role in chronic disease risk reduction is reflected in its emphasis in the overall Pritikin program. By learning how to prepare essential core Pritikin Eating Plan recipes, patients will increase control over what they eat; be able to customize the flavor of foods without the use of added salt, sugar, or fat; and improve the quality of the food they consume. By learning a set of core recipes which are  easily assembled, quickly prepared, and affordable, patients are more likely to prepare more healthy foods at home. These workshops focus on convenient breakfasts, simple entres, side dishes, and desserts which can be prepared with minimal effort and are consistent with nutrition recommendations for cardiovascular risk reduction. Cooking [employer] are taught by a Armed forces logistics/support/administrative officer (RD) who has been trained by the [employer]. The chef or RD has a clear understanding of the importance of minimizing - if not completely eliminating - added fat, sugar, and sodium in recipes. Throughout the series of Cooking School Workshop sessions, patients will learn about healthy ingredients and  efficient methods of cooking to build confidence in their capability to prepare    Cooking School weekly topics:  Adding Flavor- Sodium-Free  Fast and Healthy Breakfasts  Powerhouse Plant-Based Proteins  Satisfying Salads and Dressings  Simple Sides and Sauces  International Cuisine-Spotlight on the [employer] Zones  Delicious Desserts  Savory Soups  [employer] - Meals in a Astronomer Appetizers and Snacks  Comforting Weekend Breakfasts  One-Pot Wonders   Fast Evening Meals  Landscape architect Your Pritikin Plate  WORKSHOPS   Healthy Mindset (Psychosocial):  Focused Goals, Sustainable Changes Clinical staff led group instruction and group discussion with PowerPoint presentation and patient guidebook. To enhance the learning environment the use of posters, models and videos may be added. Patients will be able to apply effective goal setting strategies to establish at least one personal goal, and then take consistent, meaningful action toward that goal. They will learn to identify common barriers to achieving personal goals and develop strategies to overcome them. Patients will also gain an understanding of how our mind-set can impact our ability to achieve goals and the importance of cultivating a positive and growth-oriented mind-set. The purpose of this lesson is to provide patients with a deeper understanding of how to set and achieve personal goals, as well as the tools and strategies needed to overcome common obstacles which may arise along the way.  From Head to Heart: The Power of a Healthy Outlook  Clinical staff led group instruction and group discussion with PowerPoint presentation and patient guidebook. To enhance the learning environment the use of posters, models and videos may be added. Patients will be able to recognize and describe the impact of emotions and mood on physical health. They will discover the importance of self-care and explore self-care  practices which may work for them. Patients will also learn how to utilize the 4 C's to cultivate a healthier outlook and better manage stress and challenges. The purpose of this lesson is to demonstrate to patients how a healthy outlook is an essential part of maintaining good health, especially as they continue their cardiac rehab journey.  Healthy Sleep for a Healthy Heart Clinical staff led group instruction and group discussion with PowerPoint presentation and patient guidebook. To enhance the learning environment the use of posters, models and videos may be added. At the conclusion of this workshop, patients will be able to demonstrate knowledge of the importance of sleep to overall health, well-being, and quality of life. They will understand the symptoms of, and treatments for, common sleep disorders. Patients will also be able to identify daytime and nighttime behaviors which impact sleep, and they will be able to apply these tools to help manage sleep-related challenges. The purpose of this lesson is to provide patients with a general overview of sleep and outline the importance of quality sleep. Patients will  learn about a few of the most common sleep disorders. Patients will also be introduced to the concept of "sleep hygiene," and discover ways to self-manage certain sleeping problems through simple daily behavior changes. Finally, the workshop will motivate patients by clarifying the links between quality sleep and their goals of heart-healthy living.   Recognizing and Reducing Stress Clinical staff led group instruction and group discussion with PowerPoint presentation and patient guidebook. To enhance the learning environment the use of posters, models and videos may be added. At the conclusion of this workshop, patients will be able to understand the types of stress reactions, differentiate between acute and chronic stress, and recognize the impact that chronic stress has on their health. They  will also be able to apply different coping mechanisms, such as reframing negative self-talk. Patients will have the opportunity to practice a variety of stress management techniques, such as deep abdominal breathing, progressive muscle relaxation, and/or guided imagery.  The purpose of this lesson is to educate patients on the role of stress in their lives and to provide healthy techniques for coping with it.  Learning Barriers/Preferences:  Learning Barriers/Preferences - 03/06/24 1340       Learning Barriers/Preferences   Learning Barriers Sight   wears glasses   Learning Preferences Video;Written Material;Pictoral;Computer/Internet          Education Topics:  Knowledge Questionnaire Score:  Knowledge Questionnaire Score - 03/06/24 1543       Knowledge Questionnaire Score   Pre Score 19/24          Core Components/Risk Factors/Patient Goals at Admission:  Personal Goals and Risk Factors at Admission - 03/06/24 1401       Core Components/Risk Factors/Patient Goals on Admission    Weight Management Weight Maintenance    Diabetes Yes    Intervention Provide education about signs/symptoms and action to take for hypo/hyperglycemia.;Provide education about proper nutrition, including hydration, and aerobic/resistive exercise prescription along with prescribed medications to achieve blood glucose in normal ranges: Fasting glucose 65-99 mg/dL    Expected Outcomes Short Term: Participant verbalizes understanding of the signs/symptoms and immediate care of hyper/hypoglycemia, proper foot care and importance of medication, aerobic/resistive exercise and nutrition plan for blood glucose control.;Long Term: Attainment of HbA1C < 7%.    Hypertension Yes    Intervention Provide education on lifestyle modifcations including regular physical activity/exercise, weight management, moderate sodium restriction and increased consumption of fresh fruit, vegetables, and low fat dairy, alcohol  moderation, and smoking cessation.;Monitor prescription use compliance.    Expected Outcomes Short Term: Continued assessment and intervention until BP is < 140/76mm HG in hypertensive participants. < 130/83mm HG in hypertensive participants with diabetes, heart failure or chronic kidney disease.;Long Term: Maintenance of blood pressure at goal levels.    Lipids Yes    Intervention Provide education and support for participant on nutrition & aerobic/resistive exercise along with prescribed medications to achieve LDL 70mg , HDL >40mg .    Expected Outcomes Short Term: Participant states understanding of desired cholesterol values and is compliant with medications prescribed. Participant is following exercise prescription and nutrition guidelines.;Long Term: Cholesterol controlled with medications as prescribed, with individualized exercise RX and with personalized nutrition plan. Value goals: LDL < 70mg , HDL > 40 mg.    Stress Yes    Intervention Offer individual and/or small group education and counseling on adjustment to heart disease, stress management and health-related lifestyle change. Teach and support self-help strategies.;Refer participants experiencing significant psychosocial distress to appropriate mental health specialists for further evaluation  and treatment. When possible, include family members and significant others in education/counseling sessions.    Expected Outcomes Short Term: Participant demonstrates changes in health-related behavior, relaxation and other stress management skills, ability to obtain effective social support, and compliance with psychotropic medications if prescribed.;Long Term: Emotional wellbeing is indicated by absence of clinically significant psychosocial distress or social isolation.          Core Components/Risk Factors/Patient Goals Review:    Core Components/Risk Factors/Patient Goals at Discharge (Final Review):    ITP Comments:  ITP Comments      Row Name 03/06/24 1322           ITP Comments Wilbert Bihari, MD: Medical Director. Introduction to the [facility] / Intensive Cardiac Rehab. Initial orienation packet reviewed with the patient.          Comments: Participant attended orientation for the cardiac rehabilitation program on  03/06/2024  to perform initial intake and exercise walk test. Patient introduced to the [facility] education and orientation packet was reviewed. Completed 6-minute walk test, measurements, initial ITP, and exercise prescription. Vital signs stable. Telemetry-normal sinus rhythm, asymptomatic.   Service time was from 13:15 to 15:18.

## 2024-03-07 ENCOUNTER — Ambulatory Visit (HOSPITAL_COMMUNITY)

## 2024-03-08 ENCOUNTER — Other Ambulatory Visit (HOSPITAL_COMMUNITY): Payer: Self-pay | Admitting: Hematology

## 2024-03-08 DIAGNOSIS — C7A8 Other malignant neuroendocrine tumors: Secondary | ICD-10-CM

## 2024-03-09 ENCOUNTER — Ambulatory Visit (HOSPITAL_COMMUNITY)

## 2024-03-09 ENCOUNTER — Telehealth: Payer: Self-pay

## 2024-03-09 ENCOUNTER — Other Ambulatory Visit: Payer: Self-pay

## 2024-03-09 NOTE — Telephone Encounter (Addendum)
 Called patient as per Dr. Lanny to check in on patient to see if she felt like she needed to be seen any sooner than 7/8. Patient stated she is feeling better and will be ok with the early July app. Patient very appreciative of us  checking on her.    ----- Message from Tanya Walsh sent at 03/05/2024  8:09 AM EDT ----- Tanya Walsh,  I just came back from [facility].   My nurses, please contact pt to make sure she is doing OK at home, and see if she needs to be seen sooner than scheduled in early July. Please make sure she has sandostatin  injection appointment around 7/8, thx  Tanya ----- Message ----- From: Tanya PARAS, MD Sent: 02/21/2024   2:35 PM EDT To: Tanya Lanny, MD  Hello Tanya, Mrs Graw had a similar but more immediate episode of rapid onset diarrhea, mild hypotension and mild hypoglycemia.  Unable to ambulate will out aid at first.  We gave her short acting octreotide , fluids and some food.  She stabilized and is feeling better.  We will discharge her with her friend with instructions to go to ER if symptoms return or worsen.  We also gave her the long acting Sandostatin  at discharge.  I think we should consider halting Lutathera  treatment at 2 doses in her case.   Thanks [employer]

## 2024-03-12 ENCOUNTER — Ambulatory Visit (HOSPITAL_COMMUNITY)

## 2024-03-12 ENCOUNTER — Encounter (HOSPITAL_COMMUNITY)
Admission: RE | Admit: 2024-03-12 | Discharge: 2024-03-12 | Disposition: A | Discharge status: Home / Self Care | Source: Ambulatory Visit

## 2024-03-12 DIAGNOSIS — Z48812 Encounter for surgical aftercare following surgery on the circulatory system: Secondary | ICD-10-CM | POA: Diagnosis not present

## 2024-03-12 DIAGNOSIS — I214 Non-ST elevation (NSTEMI) myocardial infarction: Secondary | ICD-10-CM

## 2024-03-12 NOTE — Progress Notes (Signed)
 Daily Session Note  Patient Details  Name: Tanya Walsh MRN: [id number] Date of Birth: 12/28/1955 Referring Provider:   Flowsheet Row INTENSIVE CARDIAC REHAB ORIENT from 03/06/2024 in [facility]  Referring Provider Tanya Bihari, MD    Encounter Date: 03/12/2024  Check In:  Session Check In - 03/12/24 1444       Check-In   Supervising physician immediately available to respond to emergencies [facility] MD immediately available    Physician(s) Tanya Wyn Raddle, NP    Location [facility]    Staff Present Tanya Parkins, MS, ACSM-CEP, CCRP, Exercise Physiologist;Tanya Elnor, MS, Exercise Physiologist;Tanya Fayette, MS, Exercise Physiologist;Tanya Harvy, RN, Tanya Gal, MS, ACSM-CEP, Exercise Physiologist;Tanya Walsh BS, ACSM-CEP, Exercise Physiologist;Tanya [place], RN, BSN    Virtual Visit No    Medication changes reported     No    Fall or balance concerns reported    No    Tobacco Cessation No Change    Warm-up and Cool-down Performed as group-led instruction    Resistance Training Performed Yes    VAD Patient? No    PAD/SET Patient? No      Pain Assessment   Currently in Pain? No/denies    Pain Score 0-No pain    Multiple Pain Sites No          Capillary Blood Glucose: No results found for this or any previous visit (from the past 24 hours).   Exercise Prescription Changes - 03/12/24 1637       Response to Exercise   Blood Pressure (Admit) 126/68    Blood Pressure (Exercise) 142/86    Blood Pressure (Exit) 122/76    Heart Rate (Admit) 77 bpm    Heart Rate (Exercise) 101 bpm    Heart Rate (Exit) 85 bpm    Rating of Perceived Exertion (Exercise) 11    Perceived Dyspnea (Exercise) 0    Symptoms 0    Comments Pt first day in the [facility]CR program    Duration Progress to 30 minutes of  aerobic without signs/symptoms of physical distress    Intensity THRR unchanged       Progression   Progression Continue to progress workloads to maintain intensity without signs/symptoms of physical distress.    Average METs 2.2      Resistance Training   Training Prescription Yes    Weight 3lbs    Reps 10-15    Time 1010 Minutes      NuStep   Level 2    SPM 87    Minutes 30    METs 2.2          Social History   Tobacco Use  Smoking Status Never  Smokeless Tobacco Never    Goals Met:  Exercise tolerated well No report of concerns or symptoms today Strength training completed today  Goals Unmet:  Not Applicable  Comments: Pt started cardiac rehab today.  Pt tolerated light exercise without difficulty. VSS, telemetry-Sinus Rhythm, asymptomatic.  Medication list reconciled. Pt denies barriers to medicaiton compliance.  PSYCHOSOCIAL ASSESSMENT:  PHQ-8. Pt exhibits positive coping skills, hopeful outlook with supportive family. Tanya Walsh says that she has had less energy  and has experienced some depression since her cancer and newly  diagnosed cardiac diagnosis. Tanya Walsh says the antidepressant is controlling her depression. Tanya Walsh has good family support and is not interested in counseling at this time.   Pt enjoys video games, TV, walking and going  out with friends.   Pt oriented to exercise equipment and routine.    Understanding verbalized.Tanya Walsh Tanya Quan RN BSN     Dr. Wilbert Walsh is Medical Director for Cardiac Rehab at [facility].

## 2024-03-13 LAB — GLUCOSE, CAPILLARY
Glucose-Capillary: 113 mg/dL — ABNORMAL HIGH (ref 70–99)
Glucose-Capillary: 117 mg/dL — ABNORMAL HIGH (ref 70–99)

## 2024-03-14 ENCOUNTER — Encounter (HOSPITAL_COMMUNITY)
Admission: RE | Admit: 2024-03-14 | Discharge: 2024-03-14 | Disposition: A | Discharge status: Home / Self Care | Source: Ambulatory Visit | Attending: Cardiology | Admitting: Cardiology

## 2024-03-14 ENCOUNTER — Ambulatory Visit: Payer: Self-pay | Admitting: Cardiology

## 2024-03-14 ENCOUNTER — Ambulatory Visit (HOSPITAL_COMMUNITY)

## 2024-03-14 DIAGNOSIS — Z48812 Encounter for surgical aftercare following surgery on the circulatory system: Secondary | ICD-10-CM | POA: Insufficient documentation

## 2024-03-14 DIAGNOSIS — I214 Non-ST elevation (NSTEMI) myocardial infarction: Secondary | ICD-10-CM | POA: Diagnosis present

## 2024-03-14 DIAGNOSIS — I252 Old myocardial infarction: Secondary | ICD-10-CM | POA: Insufficient documentation

## 2024-03-14 LAB — GLUCOSE, CAPILLARY: Glucose-Capillary: 157 mg/dL — ABNORMAL HIGH (ref 70–99)

## 2024-03-15 LAB — GLUCOSE, CAPILLARY: Glucose-Capillary: 120 mg/dL — ABNORMAL HIGH (ref 70–99)

## 2024-03-19 ENCOUNTER — Encounter (HOSPITAL_COMMUNITY)
Admission: RE | Admit: 2024-03-19 | Discharge: 2024-03-19 | Disposition: A | Discharge status: Home / Self Care | Source: Ambulatory Visit | Attending: Cardiology | Admitting: Cardiology

## 2024-03-19 ENCOUNTER — Ambulatory Visit (HOSPITAL_COMMUNITY)

## 2024-03-19 DIAGNOSIS — Z48812 Encounter for surgical aftercare following surgery on the circulatory system: Secondary | ICD-10-CM | POA: Diagnosis not present

## 2024-03-19 DIAGNOSIS — I214 Non-ST elevation (NSTEMI) myocardial infarction: Secondary | ICD-10-CM

## 2024-03-20 ENCOUNTER — Inpatient Hospital Stay

## 2024-03-20 ENCOUNTER — Inpatient Hospital Stay (HOSPITAL_BASED_OUTPATIENT_CLINIC_OR_DEPARTMENT_OTHER): Admitting: Hematology

## 2024-03-20 ENCOUNTER — Inpatient Hospital Stay: Attending: Hematology

## 2024-03-20 VITALS — BP 126/80 | HR 93 | Temp 97.8°F | Resp 16 | Ht 63.0 in | Wt 178.0 lb

## 2024-03-20 DIAGNOSIS — C7B8 Other secondary neuroendocrine tumors: Secondary | ICD-10-CM | POA: Diagnosis present

## 2024-03-20 DIAGNOSIS — C7A8 Other malignant neuroendocrine tumors: Secondary | ICD-10-CM | POA: Insufficient documentation

## 2024-03-20 DIAGNOSIS — C7A019 Malignant carcinoid tumor of the small intestine, unspecified portion: Secondary | ICD-10-CM

## 2024-03-20 DIAGNOSIS — Z79899 Other long term (current) drug therapy: Secondary | ICD-10-CM | POA: Diagnosis not present

## 2024-03-20 LAB — CBC WITH DIFFERENTIAL (CANCER CENTER ONLY)
Abs Immature Granulocytes: 0.01 K/uL (ref 0.00–0.07)
Basophils Absolute: 0 K/uL (ref 0.0–0.1)
Basophils Relative: 1 %
Eosinophils Absolute: 0.2 K/uL (ref 0.0–0.5)
Eosinophils Relative: 4 %
HCT: 36.5 % (ref 36.0–46.0)
Hemoglobin: 12 g/dL (ref 12.0–15.0)
Immature Granulocytes: 0 %
Lymphocytes Relative: 32 %
Lymphs Abs: 1.4 K/uL (ref 0.7–4.0)
MCH: 29.3 pg (ref 26.0–34.0)
MCHC: 32.9 g/dL (ref 30.0–36.0)
MCV: 89 fL (ref 80.0–100.0)
Monocytes Absolute: 0.5 K/uL (ref 0.1–1.0)
Monocytes Relative: 11 %
Neutro Abs: 2.3 K/uL (ref 1.7–7.7)
Neutrophils Relative %: 52 %
Platelet Count: 240 K/uL (ref 150–400)
RBC: 4.1 MIL/uL (ref 3.87–5.11)
RDW: 12.9 % (ref 11.5–15.5)
WBC Count: 4.4 K/uL (ref 4.0–10.5)
nRBC: 0 % (ref 0.0–0.2)

## 2024-03-20 LAB — CMP (CANCER CENTER ONLY)
ALT: 18 U/L (ref 0–44)
AST: 18 U/L (ref 15–41)
Albumin: 4 g/dL (ref 3.5–5.0)
Alkaline Phosphatase: 56 U/L (ref 38–126)
Anion gap: 6 (ref 5–15)
BUN: 8 mg/dL (ref 8–23)
CO2: 31 mmol/L (ref 22–32)
Calcium: 9.2 mg/dL (ref 8.9–10.3)
Chloride: 104 mmol/L (ref 98–111)
Creatinine: 0.89 mg/dL (ref 0.44–1.00)
GFR, Estimated: >60 mL/min (ref >60)
Glucose, Bld: 197 mg/dL — ABNORMAL HIGH (ref 70–99)
Potassium: 3.5 mmol/L (ref 3.5–5.1)
Sodium: 141 mmol/L (ref 135–145)
Total Bilirubin: 0.6 mg/dL (ref 0.0–1.2)
Total Protein: 6.8 g/dL (ref 6.5–8.1)

## 2024-03-20 MED ORDER — OCTREOTIDE ACETATE 30 MG IM KIT
30.0000 mg | PACK | Freq: Once | INTRAMUSCULAR | Status: AC
  Administered 2024-03-20: 30 mg via INTRAMUSCULAR
  Filled 2024-03-20: qty 1

## 2024-03-20 NOTE — Progress Notes (Signed)
 [facility]   Telephone:(336) [phone] Fax:(336) [phone]   Clinic Follow up Note   Patient Care Team: Tanya Luke POUR, Walsh as PCP - General (Family Medicine) Tanya Wilbert SAUNDERS, Walsh as PCP - Sleep Medicine (Cardiology) Tanya Juliene SAUNDERS, DO as Consulting Physician (Neurology) Tanya Walsh as Nurse Practitioner (Nurse Practitioner) Tanya Walsh as Consulting Physician (Oncology) Tanya Juliene SAUNDERS, DO as Consulting Physician (Neurology)  Date of Service:  03/20/2024  CHIEF COMPLAINT: f/u of metastatic neuroendocrine tumor  CURRENT THERAPY:  Sandostatin  injection every 4 weeks  Oncology History   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  Liver biopsy was attempted, but the liver lesion was not visible on ultrasound. -She started Sandostatin  injection in June 2022.  Overall tolerating well. Will continue.  -last restaging CT on 09/07/2022 showed stable disease  -She is clinically doing well overall, still has intermittent pain on the left side of abdomen, overall better than last year. -She has developed hyperglycemia, possibly related to Sandostatin  injection, she is on metformin  now.  Discussed diabetic diet, and I encouraged her to exercise. 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  Treatment was subsequently stopped.  Assessment & Plan Metastatic neuroendocrine tumor She has a metastatic neuroendocrine tumor and was receiving Lutathera  treatment. Due to significant side effects including hypoglycemia and a previous myocardial infarction, the treatment was halted after  receiving 50% of the planned dose. The treatment remains an option for the future if necessary, but the current decision is to avoid further risk of side effects. - Administer scheduled injection today -she is scheduled for PET scan to evaluate response on July 24  Hypoglycemia She experienced significant hypoglycemia following Lutathera  treatment, which contributed to the decision to halt the treatment. She continues to experience episodes of low blood sugar, including a recent episode last week during cardiac rehabilitation.  Myocardial infarction She has a myocardial infarction and is currently on cardiac medications including Imdur , baby aspirin , and Plavix .  Type 2 diabetes mellitus She is managing type 2 diabetes mellitus with metformin , which was recently increased by her family doctor due to high blood sugar levels. Her current blood sugar is 197, likely influenced by recent food intake.  Gastroesophageal reflux disease (GERD) She experiences intermittent stomach pain, which is managed with a prescription for acid reflux medication. She reported taking the medication recently with relief of symptoms.  Folliculitis in scalp  She presented with a painful lesion on her scalp, which was assessed as likely folliculitis with infection. The lesion is healing, with a scab present and no current signs of pus. Hair loss in the area is noted but expected to regrow. - Advise to continue showering with soap - Apply topical antibiotics if desired, though healing is already occurring  Plan - We reviewed her complications from previous 2 Lutathera  treatment, and we have decided to stop her treatment after cycle 2 due to severe complications. 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 Estelita Manas Impression - Normal upper third of esophagus, middle third of esophagus and lower third of esophagus. - Z-line regular, 38 cm from the incisors. - Erythematous mucosa in the antrum. Biopsied. Clip (MR conditional) was placed. - Normal examined duodenum. Biopsied.   12/10/2020 Pathology Results   FINAL MICROSCOPIC DIAGNOSIS:   A. DUODENUM, BIOPSY:  - Duodenal mucosa with no significant pathologic findings.  - Negative for increased intraepithelial lymphocytes and villous  architectural changes.   B. STOMACH, ANTRUM, BIOPSY:  - Reactive gastropathy.  - Warthin-Starry stain is negative for Helicobacter pylori.    12/11/2020 Procedure   Colonoscopy, Dr. Estelita Manas impression - One 5 mm polyp in the transverse colon, removed piecemeal using a cold biopsy forceps. Resected and retrieved. - Diverticulosis in the sigmoid colon, in the descending colon and in the transverse colon. - The examined portion of the ileum was normal. - Non-bleeding internal hemorrhoids.   12/11/2020 Pathology Results   FINAL MICROSCOPIC DIAGNOSIS:   A. COLON, TRANSVERSE, POLYPECTOMY:  - Tubular adenoma.  - Negative for high grade dysplasia.    12/24/2020 Procedure   Capsule endoscopy report: A polypoid nodular growth was noted at 3 hours and 30 minutes and an ulcerated, polypoid growth was noted at 3 hours and 54 minutes which is likely the cause of obscure GI blood loss   01/23/2021 Imaging   CT entero AP w contrast IMPRESSION: 1. No acute findings identified within the abdomen or pelvis. 2. There are several focal areas of intraluminal hyperenhancement within  the small bowel loops. The largest is in the nondilated mid to distal jejunum measuring 1.3 cm. Cannot rule out underlying small bowel neoplasm. Correlation with tissue sampling results advised. Correlation with results from capsule endoscopy. 3. There is a enhancing soft tissue nodule with central calcification within the central small bowel mesentery. This is nonspecific and may represent sequelae of inflammation/infection. Primary differential considerations include carcinoid tumor or metastatic adenopathy. If there is a clinical concern for carcinoid tumor consider further investigation with DOTATATE PET. 4. Left adrenal gland adenoma. 5. Distal colonic diverticulosis without signs of acute diverticulitis. 6. Aortic atherosclerosis.   02/16/2021 PET scan   IMPRESSION: 1. Evidence of well differentiated small bowel neuroendocrine tumor with mesenteric metastasis. 2. Approximately 7 discrete small bowel lesions with intense radiotracer activity. Lesion depicted on comparison CT enterography. 3. Two intensely radiotracer avid mesenteric implants centrally within the small bowel mesentery. 4. Single hepatic metastasis with a second potential hepatic metastasis versus peritoneal implant. 5. No small bowel obstruction. 6. No evidence of metastatic disease outside the abdomen pelvis. 7. Patient may be a candidate for peptide receptor radiotherapy (Lu-177 DOTATATE) available at [facility] molecular imaging department [phone]).   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 No new or progressive interval  findings. 2. Stable  appearance of the 17 mm nodule central mesenteric nodule seen to be hypermetabolic on recent PET-CT. The second mesenteric lesion identified on that study is not discernible today. 3. Stable 1.5 cm subcapsular low-density lesion posterior right liver since PET-CT 02/16/2021. 4. 2 small bowel nodules are identified on imaging today although 7 discrete hypermetabolic small bowel lesions were seen on previous PET-CT. 5. Left colonic diverticulosis without diverticulitis. 6. Aortic Atherosclerosis (ICD10-I70.0).   02/22/2022 Imaging   EXAM: CT ABDOMEN AND PELVIS WITH CONTRAST  IMPRESSION: 1. Mural soft tissue nodule of the small bowel in the midline ventral abdomen is unchanged, as is a small adjacent metastatic mesenteric nodule. 2. No evidence of new metastatic disease in the abdomen or pelvis. 3. Diverticulosis without evidence of acute diverticulitis. 4. Coronary artery disease.   08/25/2023 Imaging   CT chest, abdomen, and pelvis with contrast  IMPRESSION: 1. Increased size of the neuroendocrine metastasis measuring 2.1 cm in the right hepatic lobe. 2. Stable enhancing nonobstructive small bowel lesions. 3. Stable mesenteric nodule. 4. Stable tiny soft tissue nodules in the bilateral posterior gluteal subcutaneous space.      Discussed the use of AI scribe software for clinical note transcription with the patient, who gave verbal consent to proceed.  History of Present Illness Tanya Walsh is a 68 year old female with metastatic neuroendocrine tumor who presents for follow-up.  She is undergoing treatment for a metastatic neuroendocrine tumor and had been receiving Lutathera  therapy. Treatment was halted after 50% of the planned dose due to significant side effects, including a severe hypoglycemic episode and a near heart attack.  She experiences episodes of hypoglycemia, particularly after cardiac rehabilitation sessions last week, despite having eaten.  She is currently taking metformin  for diabetes, which was recently increased due to high blood sugar levels at that time.     All other systems were reviewed with the patient and are negative.  MEDICAL HISTORY:  Past Medical History:  Diagnosis Date   Coronary artery disease    Depression    GI bleed 07/17/2015   Hypertension    Myocardial infarction [facility])    Nocturia 08/14/2019   Refusal of blood transfusions as patient is Jehovah's Witness     SURGICAL HISTORY: Past Surgical History:  Procedure Laterality Date   ABDOMINAL HYSTERECTOMY     BACK SURGERY     BIOPSY  12/10/2020   Procedure: BIOPSY;  Surgeon: Saintclair Jasper, Walsh;  Location: [facility] ENDOSCOPY;  Service: Gastroenterology;;   BIOPSY  12/11/2020   Procedure: BIOPSY;  Surgeon: Saintclair Jasper, Walsh;  Location: [facility] ENDOSCOPY;  Service: Gastroenterology;;   COLONOSCOPY WITH PROPOFOL  N/A 12/11/2020   Procedure: COLONOSCOPY WITH PROPOFOL ;  Surgeon: Saintclair Jasper, Walsh;  Location: [facility] ENDOSCOPY;  Service: Gastroenterology;  Laterality: N/A;   ESOPHAGOGASTRODUODENOSCOPY N/A 07/17/2015   Procedure: ESOPHAGOGASTRODUODENOSCOPY (EGD);  Surgeon: Lesta JULIANNA Fitz, Walsh;  Location: [facility] ENDOSCOPY;  Service: Endoscopy;  Laterality: N/A;   ESOPHAGOGASTRODUODENOSCOPY (EGD) WITH PROPOFOL  N/A 12/10/2020   Procedure: ESOPHAGOGASTRODUODENOSCOPY (EGD) WITH PROPOFOL ;  Surgeon: Saintclair Jasper, Walsh;  Location: [facility] ENDOSCOPY;  Service: Gastroenterology;  Laterality: N/A;   HEMOSTASIS CLIP PLACEMENT  12/10/2020   Procedure: HEMOSTASIS CLIP PLACEMENT;  Surgeon: Saintclair Jasper, Walsh;  Location: [facility] ENDOSCOPY;  Service: Gastroenterology;;   LEFT HEART CATH AND CORONARY ANGIOGRAPHY N/A 12/30/2023   Procedure: LEFT HEART CATH AND CORONARY ANGIOGRAPHY;  Surgeon: Swazil[place], Peter M, Walsh;  Location: [facility] INVASIVE CV LAB;  Service: Cardiovascular;  Laterality: N/A;    I have reviewed the social history  and family history with the patient and they are unchanged from previous note.  ALLERGIES:  is  allergic to bactrim [sulfamethoxazole-trimethoprim].  MEDICATIONS:  Current Outpatient Medications  Medication Sig Dispense Refill   acetaminophen  (TYLENOL ) 650 MG CR tablet Take 650 mg by mouth every 8 (eight) hours as needed for pain.     aspirin  EC 81 MG tablet Take 1 tablet (81 mg total) by mouth daily. Swallow whole. 90 tablet 0   clopidogrel  (PLAVIX ) 75 MG tablet Take 1 tablet (75 mg total) by mouth daily. 90 tablet 0   Cyanocobalamin  1000 MCG LOZG Take 1,000 mcg by mouth in the morning.     cyclobenzaprine  (FLEXERIL ) 5 MG tablet Take 1 tablet (5 mg total) by mouth 3 (three) times daily as needed for muscle spasms. 30 tablet 0   DULoxetine  (CYMBALTA ) 60 MG capsule Take 60 mg by mouth in the morning.     fluticasone (FLONASE) 50 MCG/ACT nasal spray Place 1 spray into both nostrils as needed for allergies or rhinitis.     folic acid  (FOLVITE ) 800 MCG tablet Take 400 mcg by mouth in the morning.     gabapentin  (NEURONTIN ) 100 MG capsule Take 1 capsule (100 mg total) by mouth at bedtime. 30 capsule 5   isosorbide  mononitrate (IMDUR ) 30 MG 24 hr tablet Take 0.5 tablets (15 mg total) by mouth daily. Can take a whole tablet if needed     losartan -hydrochlorothiazide  (HYZAAR) 50-12.5 MG tablet Take 1 tablet by mouth in the morning.     metFORMIN  (GLUCOPHAGE -XR) 500 MG 24 hr tablet Take 500 mg by mouth daily with breakfast.     Multiple Vitamins-Minerals (MULTIVITAMIN ADULTS 50+) TABS Take 1 tablet by mouth daily.     nitroGLYCERIN  (NITROSTAT ) 0.4 MG SL tablet Place 1 tablet (0.4 mg total) under the tongue every 5 (five) minutes as needed for chest pain. 30 tablet 0   ondansetron  (ZOFRAN ) 8 MG tablet Take 1 tablet (8 mg total) by mouth 2 (two) times daily as needed for nausea or vomiting. 20 tablet 0   pantoprazole  (PROTONIX ) 40 MG tablet Take 40 mg by mouth in the morning.     rosuvastatin  (CRESTOR ) 20 MG tablet Take 20 mg by mouth in the morning.     traMADol  (ULTRAM ) 50 MG tablet Take 1 tablet  (50 mg total) by mouth every 12 (twelve) hours as needed. 20 tablet 0   trolamine salicylate (ASPERCREME) 10 % cream Apply 1 application topically as needed for muscle pain.     No current facility-administered medications for this visit.    PHYSICAL EXAMINATION: ECOG PERFORMANCE STATUS: 1 - Symptomatic but completely ambulatory  Vitals:   03/20/24 1315  BP: 126/80  Pulse: 93  Resp: 16  Temp: 97.8 F (36.6 C)  SpO2: 98%   Wt Readings from Last 3 Encounters:  03/20/24 178 lb (80.7 kg)  03/06/24 180 lb 12.4 oz (82 kg)  01/20/24 176 lb 12.8 oz (80.2 kg)     GENERAL:alert, no distress and comfortable SKIN: skin color, texture, turgor are normal, no rashes or significant lesions except I small area of hair loss on scalp. Skin hard and yellowish on scalp. Lesion on scalp is flat and dry.  Likely resolved folliculitis EYES: normal, Conjunctiva are pink and non-injected, sclera clear NECK: supple, thyroid  normal size, non-tender, without nodularity LYMPH:  no palpable lymphadenopathy in the cervical, axillary  LUNGS: clear to auscultation and percussion with normal breathing effort HEART: regular rate & rhythm and no murmurs  and no lower extremity edema ABDOMEN:abdomen soft, non-tender and normal bowel sounds Musculoskeletal:no cyanosis of digits and no clubbing  NEURO: alert & oriented x 3 with fluent speech, no focal motor/sensory deficits  Physical Exam   LABORATORY DATA:  I have reviewed the data as listed    Latest Ref Rng & Units 03/20/2024   12:37 PM 02/21/2024    8:11 AM 01/05/2024   12:50 PM  CBC  WBC 4.0 - 10.5 K/uL 4.4  3.3  4.8   Hemoglobin 12.0 - 15.0 g/dL 87.9  87.6  87.3   Hematocrit 36.0 - 46.0 % 36.5  39.0  38.6   Platelets 150 - 400 K/uL 240  272  341         Latest Ref Rng & Units 03/20/2024   12:37 PM 02/21/2024    8:11 AM 01/05/2024   12:50 PM  CMP  Glucose 70 - 99 mg/dL 802  802  899   BUN 8 - 23 mg/dL 8  8  10    Creatinine 0.44 - 1.00 mg/dL 9.10   9.10  9.15   Sodium 135 - 145 mmol/L 141  136  138   Potassium 3.5 - 5.1 mmol/L 3.5  3.1  3.3   Chloride 98 - 111 mmol/L 104  103  101   CO2 22 - 32 mmol/L 31  25  30    Calcium  8.9 - 10.3 mg/dL 9.2  8.8  9.2   Total Protein 6.5 - 8.1 g/dL 6.8  7.1  7.1   Total Bilirubin 0.0 - 1.2 mg/dL 0.6  0.9  0.5   Alkaline Phos 38 - 126 U/L 56  55  53   AST 15 - 41 U/L 18  24  17    ALT 0 - 44 U/L 18  19  12        RADIOGRAPHIC STUDIES: I have personally reviewed the radiological images as listed and agreed with the findings in the report. No results found.    No orders of the defined types were placed in this encounter.  All questions were answered. The patient knows to call the clinic with any problems, questions or concerns. No barriers to learning was detected. The total time spent in the appointment was 25 minutes, including review of chart and various tests results, discussions about plan of care and coordination of care plan     Onita Mattock, Walsh 03/20/2024

## 2024-03-20 NOTE — Assessment & Plan Note (Addendum)
 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  Liver biopsy was attempted, but the liver lesion was not visible on ultrasound. -She started Sandostatin  injection in June 2022.  Overall tolerating well. Will continue.  -last restaging CT on 09/07/2022 showed stable disease  -She is clinically doing well overall, still has intermittent pain on the left side of abdomen, overall better than last year. -She has developed hyperglycemia, possibly related to Sandostatin  injection, she is on metformin  now.  Discussed diabetic diet, and I encouraged her to exercise. 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  Treatment was subsequently stopped.

## 2024-03-21 ENCOUNTER — Telehealth (HOSPITAL_COMMUNITY): Payer: Self-pay | Admitting: *Deleted

## 2024-03-21 ENCOUNTER — Encounter (HOSPITAL_COMMUNITY): Admission: RE | Admit: 2024-03-21 | Source: Ambulatory Visit

## 2024-03-21 ENCOUNTER — Ambulatory Visit (HOSPITAL_COMMUNITY)

## 2024-03-21 NOTE — Telephone Encounter (Signed)
 Shadow left voice mail message at 4:16pm on 03/20/24 informing us  she will be out Wednesday due to issues post injection. She also communicated she will be out Friday.

## 2024-03-23 ENCOUNTER — Ambulatory Visit (HOSPITAL_COMMUNITY)

## 2024-03-23 ENCOUNTER — Encounter (HOSPITAL_COMMUNITY)

## 2024-03-26 ENCOUNTER — Encounter (HOSPITAL_COMMUNITY)
Admission: RE | Admit: 2024-03-26 | Discharge: 2024-03-26 | Disposition: A | Discharge status: Home / Self Care | Source: Ambulatory Visit | Attending: Cardiology | Admitting: Cardiology

## 2024-03-26 ENCOUNTER — Ambulatory Visit (HOSPITAL_COMMUNITY)

## 2024-03-26 DIAGNOSIS — I214 Non-ST elevation (NSTEMI) myocardial infarction: Secondary | ICD-10-CM

## 2024-03-26 DIAGNOSIS — Z48812 Encounter for surgical aftercare following surgery on the circulatory system: Secondary | ICD-10-CM | POA: Diagnosis not present

## 2024-03-27 ENCOUNTER — Ambulatory Visit: Payer: Self-pay | Admitting: Cardiology

## 2024-03-27 LAB — GLUCOSE, CAPILLARY: Glucose-Capillary: 91 mg/dL (ref 70–99)

## 2024-03-27 NOTE — Progress Notes (Signed)
 Cardiac Individual Treatment Plan  Patient Details  Name: Tanya Walsh MRN: [id number] Date of Birth: 1956-01-02 Referring Provider:   Flowsheet Row INTENSIVE CARDIAC REHAB ORIENT from 03/06/2024 in [facility]  Referring Provider Wilbert Bihari, MD    Initial Encounter Date:  Flowsheet Row INTENSIVE CARDIAC REHAB ORIENT from 03/06/2024 in [facility]  Date 03/06/24    Visit Diagnosis: 12/29/23 NSTEMI (non-ST elevated myocardial infarction) [facility])  Patient's Home Medications on Admission:  Current Outpatient Medications:    acetaminophen  (TYLENOL ) 650 MG CR tablet, Take 650 mg by mouth every 8 (eight) hours as needed for pain., Disp: , Rfl:    aspirin  EC 81 MG tablet, Take 1 tablet (81 mg total) by mouth daily. Swallow whole., Disp: 90 tablet, Rfl: 0   clopidogrel  (PLAVIX ) 75 MG tablet, Take 1 tablet (75 mg total) by mouth daily., Disp: 90 tablet, Rfl: 0   Cyanocobalamin  1000 MCG LOZG, Take 1,000 mcg by mouth in the morning., Disp: , Rfl:    cyclobenzaprine  (FLEXERIL ) 5 MG tablet, Take 1 tablet (5 mg total) by mouth 3 (three) times daily as needed for muscle spasms., Disp: 30 tablet, Rfl: 0   DULoxetine  (CYMBALTA ) 60 MG capsule, Take 60 mg by mouth in the morning., Disp: , Rfl:    fluticasone (FLONASE) 50 MCG/ACT nasal spray, Place 1 spray into both nostrils as needed for allergies or rhinitis., Disp: , Rfl:    folic acid  (FOLVITE ) 800 MCG tablet, Take 400 mcg by mouth in the morning., Disp: , Rfl:    gabapentin  (NEURONTIN ) 100 MG capsule, Take 1 capsule (100 mg total) by mouth at bedtime., Disp: 30 capsule, Rfl: 5   isosorbide  mononitrate (IMDUR ) 30 MG 24 hr tablet, Take 0.5 tablets (15 mg total) by mouth daily. Can take a whole tablet if needed, Disp: , Rfl:    losartan -hydrochlorothiazide  (HYZAAR) 50-12.5 MG tablet, Take 1 tablet by mouth in the morning., Disp: , Rfl:     metFORMIN  (GLUCOPHAGE -XR) 500 MG 24 hr tablet, Take 500 mg by mouth daily with breakfast., Disp: , Rfl:    Multiple Vitamins-Minerals (MULTIVITAMIN ADULTS 50+) TABS, Take 1 tablet by mouth daily., Disp: , Rfl:    nitroGLYCERIN  (NITROSTAT ) 0.4 MG SL tablet, Place 1 tablet (0.4 mg total) under the tongue every 5 (five) minutes as needed for chest pain., Disp: 30 tablet, Rfl: 0   ondansetron  (ZOFRAN ) 8 MG tablet, Take 1 tablet (8 mg total) by mouth 2 (two) times daily as needed for nausea or vomiting., Disp: 20 tablet, Rfl: 0   pantoprazole  (PROTONIX ) 40 MG tablet, Take 40 mg by mouth in the morning., Disp: , Rfl:    rosuvastatin  (CRESTOR ) 20 MG tablet, Take 20 mg by mouth in the morning., Disp: , Rfl:    traMADol  (ULTRAM ) 50 MG tablet, Take 1 tablet (50 mg total) by mouth every 12 (twelve) hours as needed., Disp: 20 tablet, Rfl: 0   trolamine salicylate (ASPERCREME) 10 % cream, Apply 1 application topically as needed for muscle pain., Disp: , Rfl:   Past Medical History: Past Medical History:  Diagnosis Date   Coronary artery disease    Depression    GI bleed 07/17/2015   Hypertension    Myocardial infarction [facility])    Nocturia 08/14/2019   Refusal of blood transfusions as patient is Jehovah's Witness     Tobacco Use: Social History   Tobacco Use  Smoking Status Never  Smokeless Tobacco Never    Labs: Review Flowsheet  More data exists      Latest Ref Rng & Units 04/08/2010 12/25/2019 08/29/2022 12/29/2023 12/31/2023  Labs for ITP Cardiac and Pulmonary Rehab  Cholestrol 0 - 200 mg/dL - 854  - - 882   LDL (calc) 0 - 99 mg/dL - 71  - - 52   HDL-C >59 mg/dL - 61  - - 53   Trlycerides <150 mg/dL - 62  - - 61   Hemoglobin A1c 4.8 - 5.6 % - - - 7.5  -  TCO2 22 - 32 mmol/L 25  - 27  22  -    Capillary Blood Glucose: Lab Results  Component Value Date   GLUCAP 91 03/26/2024   GLUCAP 120 (H) 03/14/2024   GLUCAP 157 (H) 03/14/2024   GLUCAP 117 (H) 03/12/2024   GLUCAP 113 (H)  03/12/2024     Exercise Target Goals: Exercise Program Goal: Individual exercise prescription set using results from initial 6 min walk test and THRR while considering  patient's activity barriers and safety.   Exercise Prescription Goal: Initial exercise prescription builds to 30-45 minutes a day of aerobic activity, 2-3 days per week.  Home exercise guidelines will be given to patient during program as part of exercise prescription that the participant will acknowledge.  Activity Barriers & Risk Stratification:  Activity Barriers & Cardiac Risk Stratification - 03/06/24 1340       Activity Barriers & Cardiac Risk Stratification   Activity Barriers Back Problems;Deconditioning;Balance Concerns    Cardiac Risk Stratification High          6 Minute Walk:  6 Minute Walk     Row Name 03/06/24 1534         6 Minute Walk   Phase Initial     Distance 1308 feet     Walk Time 6 minutes     # of Rest Breaks 0     MPH 2.5     METS 2.6     RPE 11     Perceived Dyspnea  1     VO2 Peak 9.03     Symptoms Yes (comment)     Comments Mild SOB, RPD =1; low back pain 7/10, rt hip pain 4/10     Resting HR 64 bpm     Resting BP 130/78     Resting Oxygen Saturation  95 %     Exercise Oxygen Saturation  during 6 min walk 98 %     Max Ex. HR 95 bpm     Max Ex. BP 134/80        Oxygen Initial Assessment:   Oxygen Re-Evaluation:   Oxygen Discharge (Final Oxygen Re-Evaluation):   Initial Exercise Prescription:  Initial Exercise Prescription - 03/06/24 1500       Date of Initial Exercise RX and Referring Provider   Date 03/06/24    Referring Provider Wilbert Bihari, MD    Expected Discharge Date 05/30/24      NuStep   Level 2    SPM 75    Minutes 30    METs 2.6      Prescription Details   Frequency (times per week) 3    Duration Progress to 30 minutes of continuous aerobic without signs/symptoms of physical distress      Intensity   THRR 40-80% of Max Heartrate  60-120    Ratings of Perceived Exertion 11-13    Perceived Dyspnea 0-4  Progression   Progression Continue progressive overload as per policy without signs/symptoms or physical distress.      Resistance Training   Training Prescription Yes    Weight 3lbs    Reps 10-15          Perform Capillary Blood Glucose checks as needed.  Exercise Prescription Changes:   Exercise Prescription Changes     Row Name 03/12/24 1637             Response to Exercise   Blood Pressure (Admit) 126/68       Blood Pressure (Exercise) 142/86       Blood Pressure (Exit) 122/76       Heart Rate (Admit) 77 bpm       Heart Rate (Exercise) 101 bpm       Heart Rate (Exit) 85 bpm       Rating of Perceived Exertion (Exercise) 11       Perceived Dyspnea (Exercise) 0       Symptoms 0       Comments Pt first day in the [facility] ICR program       Duration Progress to 30 minutes of  aerobic without signs/symptoms of physical distress       Intensity THRR unchanged         Progression   Progression Continue to progress workloads to maintain intensity without signs/symptoms of physical distress.       Average METs 2.2         Resistance Training   Training Prescription Yes       Weight 3lbs       Reps 10-15       Time 1010 Minutes         NuStep   Level 2       SPM 87       Minutes 30       METs 2.2          Exercise Comments:   Exercise Comments     Row Name 03/12/24 1643           Exercise Comments Pt first day in the [facility] ICR program. Pt tolerated exercise well with an average MET level of 2.2. She is off to a good start and is learning her THRR, RPE and ExRx          Exercise Goals and Review:   Exercise Goals     Row Name 03/06/24 1339             Exercise Goals   Increase Physical Activity Yes       Intervention Provide advice, education, support and counseling about physical activity/exercise needs.;Develop an individualized exercise prescription for  aerobic and resistive training based on initial evaluation findings, risk stratification, comorbidities and participant's personal goals.       Expected Outcomes Short Term: Attend rehab on a regular basis to increase amount of physical activity.;Long Term: Add in home exercise to make exercise part of routine and to increase amount of physical activity.;Long Term: Exercising regularly at least 3-5 days a week.       Increase Strength and Stamina Yes       Intervention Provide advice, education, support and counseling about physical activity/exercise needs.;Develop an individualized exercise prescription for aerobic and resistive training based on initial evaluation findings, risk stratification, comorbidities and participant's personal goals.       Expected Outcomes Short Term: Increase workloads from initial exercise prescription for resistance, speed, and METs.;Short Term:  Perform resistance training exercises routinely during rehab and add in resistance training at home;Long Term: Improve cardiorespiratory fitness, muscular endurance and strength as measured by increased METs and functional capacity ( )       Able to understand and use rate of perceived exertion (RPE) scale Yes       Intervention Provide education and explanation on how to use RPE scale       Expected Outcomes Short Term: Able to use RPE daily in rehab to express subjective intensity level;Long Term:  Able to use RPE to guide intensity level when exercising independently       Knowledge and understanding of Target Heart Rate Range (THRR) Yes       Intervention Provide education and explanation of THRR including how the numbers were predicted and where they are located for reference       Expected Outcomes Short Term: Able to state/look up THRR;Short Term: Able to use daily as guideline for intensity in rehab;Long Term: Able to use THRR to govern intensity when exercising independently       Understanding of Exercise Prescription  Yes       Intervention Provide education, explanation, and written materials on patient's individual exercise prescription       Expected Outcomes Short Term: Able to explain program exercise prescription;Long Term: Able to explain home exercise prescription to exercise independently          Exercise Goals Re-Evaluation :  Exercise Goals Re-Evaluation     Row Name 03/12/24 1641             Exercise Goal Re-Evaluation   Exercise Goals Review Increase Physical Activity;Understanding of Exercise Prescription;Knowledge and understanding of Target Heart Rate Range (THRR);Increase Strength and Stamina;Able to understand and use rate of perceived exertion (RPE) scale       Comments Pt first day in the [facility] ICR program. Pt tolerated exercise well with an average MET level of 2.2. She is off to a good start and is learning her THRR, RPE and ExRx       Expected Outcomes Will continue to monitor pt and progress workloads as tolerated without sign or symptom          Discharge Exercise Prescription (Final Exercise Prescription Changes):  Exercise Prescription Changes - 03/12/24 1637       Response to Exercise   Blood Pressure (Admit) 126/68    Blood Pressure (Exercise) 142/86    Blood Pressure (Exit) 122/76    Heart Rate (Admit) 77 bpm    Heart Rate (Exercise) 101 bpm    Heart Rate (Exit) 85 bpm    Rating of Perceived Exertion (Exercise) 11    Perceived Dyspnea (Exercise) 0    Symptoms 0    Comments Pt first day in the [facility] ICR program    Duration Progress to 30 minutes of  aerobic without signs/symptoms of physical distress    Intensity THRR unchanged      Progression   Progression Continue to progress workloads to maintain intensity without signs/symptoms of physical distress.    Average METs 2.2      Resistance Training   Training Prescription Yes    Weight 3lbs    Reps 10-15    Time 1010 Minutes      NuStep   Level 2    SPM 87    Minutes 30    METs 2.2           Nutrition:  Target Goals: Understanding of nutrition guidelines,  daily intake of sodium 1500mg , cholesterol 200mg , calories 30% from fat and 7% or less from saturated fats, daily to have 5 or more servings of fruits and vegetables.  Biometrics:  Pre Biometrics - 03/06/24 1400       Pre Biometrics   Waist Circumference 39.5 inches    Hip Circumference 47 inches    Waist to Hip Ratio 0.84 %    Triceps Skinfold 24 mm    % Body Fat 42.4 %    Grip Strength 12 kg    Flexibility --   Not performed, active chronic low back pain 4/10   Single Leg Stand 2 seconds           Nutrition Therapy Plan and Nutrition Goals:  Nutrition Therapy & Goals - 03/15/24 1435       Nutrition Therapy   Diet Heart Healthy Diet    Drug/Food Interactions Statins/Certain Fruits      Personal Nutrition Goals   Nutrition Goal Patient to identify strategies for reducing cardiovascular risk by attending the [facility] education and nutrition series weekly.    Personal Goal #2 Patient to improve diet quality by using the plate method as a guide for meal planning to include lean protein/plant protein, fruits, vegetables, whole grains, nonfat dairy as part of a well-balanced diet.    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 Lipids are well controlled at goal. A1c is not at goal.   Patient will benefit from participation in intensive cardiac rehab for nutrition education, exercise, and lifestyle modification.      Intervention Plan   Intervention Prescribe, educate and counsel regarding individualized specific dietary modifications aiming towards targeted core components such as weight, hypertension, lipid management, diabetes, heart failure and other comorbidities.;Nutrition handout(s) given to patient.    Expected Outcomes Short Term Goal: Understand basic principles of dietary content, such as calories, fat,  sodium, cholesterol and nutrients.;Long Term Goal: Adherence to prescribed nutrition plan.          Nutrition Assessments:  MEDIFICTS Score Key: >=70 Need to make dietary changes  40-70 Heart Healthy Diet <= 40 Therapeutic Level Cholesterol Diet    Picture Your Plate Scores: <59 Unhealthy dietary pattern with much room for improvement. 41-50 Dietary pattern unlikely to meet recommendations for good health and room for improvement. 51-60 More healthful dietary pattern, with some room for improvement.  >60 Healthy dietary pattern, although there may be some specific behaviors that could be improved.    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 Lipids are well controlled at goal. A1c is not at goal. Patient will benefit from participation in intensive cardiac rehab for nutrition education, exercise, and lifestyle modification.          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 Lipids are well controlled at goal. A1c is not at goal. Patient will benefit from participation in intensive cardiac rehab for nutrition education, exercise, and lifestyle modification.          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 Lipids are well controlled at goal. A1c is not at goal. Patient will benefit from participation in intensive cardiac rehab for nutrition education, exercise, and lifestyle modification.          Psychosocial: Target Goals: Acknowledge presence or absence of significant depression and/or stress, maximize coping skills, provide positive support system. Participant is able to verbalize types and ability to use techniques and skills needed for reducing stress and depression.  Initial Review & Psychosocial Screening:  Initial Psych Review & Screening - 03/06/24 1449       Initial Review   Current issues with Current Depression;Current Psychotropic Meds;History of Depression;Current Stress Concerns    Source of Stress Concerns Chronic Illness;Unable to participate in former interests or hobbies    Comments Pt attributes some of this to her CA diagnosis/treatment      Family Dynamics   Good Support System? Yes   Pt has brothers and sisters and her church community     Barriers   Psychosocial barriers to participate in program The patient should benefit from training in stress management and relaxation.      Screening Interventions   Interventions Encouraged to exercise    Expected Outcomes Short Term goal: Utilizing psychosocial counselor, staff and physician to assist with identification of specific Stressors or current issues interfering with healing process. Setting desired goal for each stressor or current issue identified.;Long Term Goal: Stressors or current issues are controlled or eliminated.;Short Term goal: Identification and review with participant of any Quality of Life or Depression concerns found by scoring the questionnaire.;Long Term goal: The participant improves quality of Life and PHQ9 Scores as seen by post scores and/or verbalization of  changes          Quality of Life Scores:  Quality of Life - 03/06/24 1445       Quality of Life   Select Quality of Life      Quality of Life Scores   Health/Function Pre 19.19 %    Socioeconomic Pre 28 %    Psych/Spiritual Pre 30 %    Family Pre 27 %    GLOBAL Pre 24.02 %         Scores of 19 and below usually indicate a poorer quality of life in these areas.  A difference of  2-3 points is a clinically meaningful difference.  A difference of 2-3 points in the total score of the Quality of Life Index has been associated with significant improvement in overall quality of life, self-image, physical symptoms, and general health in studies assessing change in quality of life.  PHQ-9: Review Flowsheet       03/06/2024  Depression screen PHQ 2/9  Decreased Interest 2  Down, Depressed, Hopeless 2  PHQ - 2 Score 4  Altered sleeping 0  Tired, decreased energy 1  Change in appetite 2  Feeling bad or failure about yourself  0  Trouble concentrating 0  Moving slowly or fidgety/restless 1  Suicidal thoughts 0  PHQ-9 Score 8  Difficult doing work/chores Somewhat difficult   Interpretation of Total Score  Total Score Depression Severity:  1-4 = Minimal depression, 5-9 = Mild depression, 10-14 = Moderate depression, 15-19 = Moderately severe depression, 20-27 = Severe depression   Psychosocial Evaluation and Intervention:   Psychosocial Re-Evaluation:  Psychosocial Re-Evaluation     Row Name 03/12/24 1737 03/27/24 1520           Psychosocial Re-Evaluation   Current issues with Current Depression;History of Depression;Current Psychotropic Meds;Current Stress Concerns Current Depression;History of Depression;Current Psychotropic Meds;Current Stress Concerns      Comments Aaryn says that she has had less energy  and has experienced some depression since her cancer and newly  diagnosed cardiac diagnosis. Elias says the antidepressant is controlling her depression.  Amoree has good family support and is not interested in counseling at this time. Quinita says the antidepressant is controlling her depression. Davisha has not voiced any increased concerns or stressors during exercise at cardiac rehab.      Expected Outcomes Analina will have controlled or decreased depression/ stressor supon completion of cardiac rehab. Shawntay is going through chemotherapy and radiation and will attend cardiac rehab as she can tolerate. Earl will have controlled or decreased depression/ stressor supon completion of cardiac rehab. Terecia is going through chemotherapy and radiation and will attend cardiac rehab as she can tolerate.      Interventions Stress management education;Relaxation education;Encouraged to attend Cardiac Rehabilitation for the exercise Stress management education;Relaxation education;Encouraged to attend Cardiac Rehabilitation for the exercise      Continue Psychosocial Services  Follow up required by staff Follow up required by staff        Initial Review   Source of Stress Concerns Chronic Illness;Unable to participate in former interests or hobbies;Unable to perform yard/household activities Chronic Illness;Unable to participate in former interests or hobbies;Unable to perform yard/household activities      Comments Will continue to montior and offer support as needed. Will continue to montior and offer support as needed.         Psychosocial Discharge (Final Psychosocial Re-Evaluation):  Psychosocial Re-Evaluation - 03/27/24 1520       Psychosocial Re-Evaluation   Current issues with Current Depression;History of Depression;Current Psychotropic Meds;Current Stress Concerns    Comments Eliana says the antidepressant is controlling her depression. Cornelia has not voiced any increased concerns or stressors during exercise at cardiac rehab.    Expected Outcomes Jelicia will have controlled or decreased depression/ stressor supon  completion of cardiac rehab. Baila is going through chemotherapy and radiation and will attend cardiac rehab as she can tolerate.    Interventions Stress management education;Relaxation education;Encouraged to attend Cardiac Rehabilitation for the exercise    Continue Psychosocial Services  Follow up required by staff      Initial Review   Source of Stress Concerns Chronic Illness;Unable to participate in former interests or hobbies;Unable to perform yard/household activities    Comments Will continue to montior and offer support as needed.          Vocational Rehabilitation: Provide vocational rehab assistance to qualifying candidates.   Vocational Rehab Evaluation & Intervention:  Vocational Rehab - 03/06/24 1341       Initial Vocational Rehab Evaluation &  Intervention   Assessment shows need for Vocational Rehabilitation No   Retired         Education: Education Goals: Education classes will be provided on a weekly basis, covering required topics. Participant will state understanding/return demonstration of topics presented.    Education     Row Name 03/12/24 1400     Education   Cardiac Education Topics [facility]   Glass blower/designer Nutrition   Nutrition Workshop Fueling a Forensic psychologist   Instruction Review Code 1- Teaching laboratory technician Start Time 1400   Class Stop Time 1445   Class Time Calculation (min) 45 min    Row Name 03/14/24 1400     Education   Cardiac Education Topics [facility]   Customer service manager   Weekly Topic International Cuisine- Spotlight on the [employer] Zones   Instruction Review Code 1- Verbalizes Understanding   Class Start Time 1355   Class Stop Time 1435   Class Time Calculation (min) 40 min    Row Name 03/19/24 1500     Education   Cardiac Education Topics [facility]   Consulting civil engineer Psychosocial   Psychosocial Workshop Healthy Sleep for a Healthy Heart   Instruction Review Code 1- Verbalizes Understanding   Class Start Time 1400   Class Stop Time 1443   Class Time Calculation (min) 43 min    Row Name 03/26/24 1400     Education   Cardiac Education Topics [facility]   Hospital doctor Education   General Education Hypertension and Heart Disease   Instruction Review Code 1- Verbalizes Understanding   Class Start Time 1403   Class Stop Time 1445   Class Time Calculation (min) 42 min      Core Videos: Exercise    Move It!  Clinical staff conducted group or individual video education with verbal and written material and guidebook.  Patient learns the recommended [facility] exercise program. Exercise with the goal of living a long, healthy life. Some of the health benefits of exercise include controlled diabetes, healthier blood pressure levels, improved cholesterol levels, improved heart and lung capacity, improved sleep, and better body composition. Everyone should speak with their doctor before starting or changing an exercise routine.  Biomechanical Limitations Clinical staff conducted group or individual video education with verbal and written material and guidebook.  Patient learns how biomechanical limitations can impact exercise and how we can mitigate and possibly overcome limitations to have an impactful and balanced exercise routine.  Body Composition Clinical staff conducted group or individual video education with verbal and written material and guidebook.  Patient learns that body composition (ratio of muscle mass to fat mass) is a key component to assessing overall fitness, rather than body weight alone. Increased fat mass, especially visceral belly fat, can put us  at increased risk for metabolic syndrome, type 2 diabetes, heart disease, and even death. It is recommended to  combine diet and exercise (cardiovascular and resistance training) to improve your body composition. Seek guidance from your physician and exercise physiologist before implementing an exercise routine.  Exercise Action Plan Clinical staff conducted group or individual video education with verbal and written material and guidebook.  Patient learns the recommended strategies to achieve  and enjoy long-term exercise adherence, including variety, self-motivation, self-efficacy, and positive decision making. Benefits of exercise include fitness, good health, weight management, more energy, better sleep, less stress, and overall well-being.  Medical   Heart Disease Risk Reduction Clinical staff conducted group or individual video education with verbal and written material and guidebook.  Patient learns our heart is our most vital organ as it circulates oxygen, nutrients, white blood cells, and hormones throughout the entire body, and carries waste away. Data supports a plant-based eating plan like the Pritikin Program for its effectiveness in slowing progression of and reversing heart disease. The video provides a number of recommendations to address heart disease.   Metabolic Syndrome and Belly Fat  Clinical staff conducted group or individual video education with verbal and written material and guidebook.  Patient learns what metabolic syndrome is, how it leads to heart disease, and how one can reverse it and keep it from coming back. You have metabolic syndrome if you have 3 of the following 5 criteria: abdominal obesity, high blood pressure, high triglycerides, low HDL cholesterol, and high blood sugar.  Hypertension and Heart Disease Clinical staff conducted group or individual video education with verbal and written material and guidebook.  Patient learns that high blood pressure, or hypertension, is very common in the [place] . Hypertension is largely due to excessive salt intake, but other  important risk factors include being overweight, physical inactivity, drinking too much alcohol, smoking, and not eating enough potassium from fruits and vegetables. High blood pressure is a leading risk factor for heart attack, stroke, congestive heart failure, dementia, kidney failure, and premature death. Long-term effects of excessive salt intake include stiffening of the arteries and thickening of heart muscle and organ damage. Recommendations include ways to reduce hypertension and the risk of heart disease.  Diseases of Our Time - Focusing on Diabetes Clinical staff conducted group or individual video education with verbal and written material and guidebook.  Patient learns why the best way to stop diseases of our time is prevention, through food and other lifestyle changes. Medicine (such as prescription pills and surgeries) is often only a Band-Aid on the problem, not a long-term solution. Most common diseases of our time include obesity, type 2 diabetes, hypertension, heart disease, and cancer. The Pritikin Program is recommended and has been proven to help reduce, reverse, and/or prevent the damaging effects of metabolic syndrome.  Nutrition   Overview of the Pritikin Eating Plan  Clinical staff conducted group or individual video education with verbal and written material and guidebook.  Patient learns about the Pritikin Eating Plan for disease risk reduction. The Pritikin Eating Plan emphasizes a wide variety of unrefined, minimally-processed carbohydrates, like fruits, vegetables, whole grains, and legumes. Go, Caution, and Stop food choices are explained. Plant-based and lean animal proteins are emphasized. Rationale provided for low sodium intake for blood pressure control, low added sugars for blood sugar stabilization, and low added fats and oils for coronary artery disease risk reduction and weight management.  Calorie Density  Clinical staff conducted group or individual video  education with verbal and written material and guidebook.  Patient learns about calorie density and how it impacts the Pritikin Eating Plan. Knowing the characteristics of the food you choose will help you decide whether those foods will lead to weight gain or weight loss, and whether you want to consume more or less of them. Weight loss is usually a side effect of the Pritikin Eating Plan because of its focus on  low calorie-dense foods.  Label Reading  Clinical staff conducted group or individual video education with verbal and written material and guidebook.  Patient learns about the Pritikin recommended label reading guidelines and corresponding recommendations regarding calorie density, added sugars, sodium content, and whole grains.  Dining Out - Part 1  Clinical staff conducted group or individual video education with verbal and written material and guidebook.  Patient learns that restaurant meals can be sabotaging because they can be so high in calories, fat, sodium, and/or sugar. Patient learns recommended strategies on how to positively address this and avoid unhealthy pitfalls.  Facts on Fats  Clinical staff conducted group or individual video education with verbal and written material and guidebook.  Patient learns that lifestyle modifications can be just as effective, if not more so, as many medications for lowering your risk of heart disease. A Pritikin lifestyle can help to reduce your risk of inflammation and atherosclerosis (cholesterol build-up, or plaque, in the artery walls). Lifestyle interventions such as dietary choices and physical activity address the cause of atherosclerosis. A review of the types of fats and their impact on blood cholesterol levels, along with dietary recommendations to reduce fat intake is also included.  Nutrition Action Plan  Clinical staff conducted group or individual video education with verbal and written material and guidebook.  Patient learns how  to incorporate Pritikin recommendations into their lifestyle. Recommendations include planning and keeping personal health goals in mind as an important part of their success.  Healthy Mind-Set    Healthy Minds, Bodies, Hearts  Clinical staff conducted group or individual video education with verbal and written material and guidebook.  Patient learns how to identify when they are stressed. Video will discuss the impact of that stress, as well as the many benefits of stress management. Patient will also be introduced to stress management techniques. The way we think, act, and feel has an impact on our hearts.  How Our Thoughts Can Heal Our Hearts  Clinical staff conducted group or individual video education with verbal and written material and guidebook.  Patient learns that negative thoughts can cause depression and anxiety. This can result in negative lifestyle behavior and serious health problems. Cognitive behavioral therapy is an effective method to help control our thoughts in order to change and improve our emotional outlook.  Additional Videos:  Exercise    Improving Performance  Clinical staff conducted group or individual video education with verbal and written material and guidebook.  Patient learns to use a non-linear approach by alternating intensity levels and lengths of time spent exercising to help burn more calories and lose more body fat. Cardiovascular exercise helps improve heart health, metabolism, hormonal balance, blood sugar control, and recovery from fatigue. Resistance training improves strength, endurance, balance, coordination, reaction time, metabolism, and muscle mass. Flexibility exercise improves circulation, posture, and balance. Seek guidance from your physician and exercise physiologist before implementing an exercise routine and learn your capabilities and proper form for all exercise.  Introduction to Yoga  Clinical staff conducted group or individual video  education with verbal and written material and guidebook.  Patient learns about yoga, a discipline of the coming together of mind, breath, and body. The benefits of yoga include improved flexibility, improved range of motion, better posture and core strength, increased lung function, weight loss, and positive self-image. Yoga's heart health benefits include lowered blood pressure, healthier heart rate, decreased cholesterol and triglyceride levels, improved immune function, and reduced stress. Seek guidance from your physician and  exercise physiologist before implementing an exercise routine and learn your capabilities and proper form for all exercise.  Medical   Aging: Enhancing Your Quality of Life  Clinical staff conducted group or individual video education with verbal and written material and guidebook.  Patient learns key strategies and recommendations to stay in good physical health and enhance quality of life, such as prevention strategies, having an advocate, securing a Health Care Proxy and Power of Attorney, and keeping a list of medications and system for tracking them. It also discusses how to avoid risk for bone loss.  Biology of Weight Control  Clinical staff conducted group or individual video education with verbal and written material and guidebook.  Patient learns that weight gain occurs because we consume more calories than we burn (eating more, moving less). Even if your body weight is normal, you may have higher ratios of fat compared to muscle mass. Too much body fat puts you at increased risk for cardiovascular disease, heart attack, stroke, type 2 diabetes, and obesity-related cancers. In addition to exercise, following the Pritikin Eating Plan can help reduce your risk.  Decoding Lab Results  Clinical staff conducted group or individual video education with verbal and written material and guidebook.  Patient learns that lab test reflects one measurement whose values change over  time and are influenced by many factors, including medication, stress, sleep, exercise, food, hydration, pre-existing medical conditions, and more. It is recommended to use the knowledge from this video to become more involved with your lab results and evaluate your numbers to speak with your doctor.   Diseases of Our Time - Overview  Clinical staff conducted group or individual video education with verbal and written material and guidebook.  Patient learns that according to the CDC, 50% to 70% of chronic diseases (such as obesity, type 2 diabetes, elevated lipids, hypertension, and heart disease) are avoidable through lifestyle improvements including healthier food choices, listening to satiety cues, and increased physical activity.  Sleep Disorders Clinical staff conducted group or individual video education with verbal and written material and guidebook.  Patient learns how good quality and duration of sleep are important to overall health and well-being. Patient also learns about sleep disorders and how they impact health along with recommendations to address them, including discussing with a physician.  Nutrition  Dining Out - Part 2 Clinical staff conducted group or individual video education with verbal and written material and guidebook.  Patient learns how to plan ahead and communicate in order to maximize their dining experience in a healthy and nutritious manner. Included are recommended food choices based on the type of restaurant the patient is visiting.   Fueling a Banker conducted group or individual video education with verbal and written material and guidebook.  There is a strong connection between our food choices and our health. Diseases like obesity and type 2 diabetes are very prevalent and are in large-part due to lifestyle choices. The Pritikin Eating Plan provides plenty of food and hunger-curbing satisfaction. It is easy to follow, affordable, and  helps reduce health risks.  Menu Workshop  Clinical staff conducted group or individual video education with verbal and written material and guidebook.  Patient learns that restaurant meals can sabotage health goals because they are often packed with calories, fat, sodium, and sugar. Recommendations include strategies to plan ahead and to communicate with the manager, chef, or server to help order a healthier meal.  Planning Your Eating Strategy  Clinical staff  conducted group or individual video education with verbal and written material and guidebook.  Patient learns about the Pritikin Eating Plan and its benefit of reducing the risk of disease. The Pritikin Eating Plan does not focus on calories. Instead, it emphasizes high-quality, nutrient-rich foods. By knowing the characteristics of the foods, we choose, we can determine their calorie density and make informed decisions.  Targeting Your Nutrition Priorities  Clinical staff conducted group or individual video education with verbal and written material and guidebook.  Patient learns that lifestyle habits have a tremendous impact on disease risk and progression. This video provides eating and physical activity recommendations based on your personal health goals, such as reducing LDL cholesterol, losing weight, preventing or controlling type 2 diabetes, and reducing high blood pressure.  Vitamins and Minerals  Clinical staff conducted group or individual video education with verbal and written material and guidebook.  Patient learns different ways to obtain key vitamins and minerals, including through a recommended healthy diet. It is important to discuss all supplements you take with your doctor.   Healthy Mind-Set    Smoking Cessation  Clinical staff conducted group or individual video education with verbal and written material and guidebook.  Patient learns that cigarette smoking and tobacco addiction pose a serious health risk which  affects millions of people. Stopping smoking will significantly reduce the risk of heart disease, lung disease, and many forms of cancer. Recommended strategies for quitting are covered, including working with your doctor to develop a successful plan.  Culinary   Becoming a Set designer conducted group or individual video education with verbal and written material and guidebook.  Patient learns that cooking at home can be healthy, cost-effective, quick, and puts them in control. Keys to cooking healthy recipes will include looking at your recipe, assessing your equipment needs, planning ahead, making it simple, choosing cost-effective seasonal ingredients, and limiting the use of added fats, salts, and sugars.  Cooking - Breakfast and Snacks  Clinical staff conducted group or individual video education with verbal and written material and guidebook.  Patient learns how important breakfast is to satiety and nutrition through the entire day. Recommendations include key foods to eat during breakfast to help stabilize blood sugar levels and to prevent overeating at meals later in the day. Planning ahead is also a key component.  Cooking - Educational psychologist conducted group or individual video education with verbal and written material and guidebook.  Patient learns eating strategies to improve overall health, including an approach to cook more at home. Recommendations include thinking of animal protein as a side on your plate rather than center stage and focusing instead on lower calorie dense options like vegetables, fruits, whole grains, and plant-based proteins, such as beans. Making sauces in large quantities to freeze for later and leaving the skin on your vegetables are also recommended to maximize your experience.  Cooking - Healthy Salads and Dressing Clinical staff conducted group or individual video education with verbal and written material and guidebook.   Patient learns that vegetables, fruits, whole grains, and legumes are the foundations of the Pritikin Eating Plan. Recommendations include how to incorporate each of these in flavorful and healthy salads, and how to create homemade salad dressings. Proper handling of ingredients is also covered. Cooking - Soups and [employer] - Soups and Desserts Clinical staff conducted group or individual video education with verbal and written material and guidebook.  Patient learns that Pritikin soups and  desserts make for easy, nutritious, and delicious snacks and meal components that are low in sodium, fat, sugar, and calorie density, while high in vitamins, minerals, and filling fiber. Recommendations include simple and healthy ideas for soups and desserts.   Overview     The Pritikin Solution Program Overview Clinical staff conducted group or individual video education with verbal and written material and guidebook.  Patient learns that the results of the Pritikin Program have been documented in more than 100 articles published in peer-reviewed journals, and the benefits include reducing risk factors for (and, in some cases, even reversing) high cholesterol, high blood pressure, type 2 diabetes, obesity, and more! An overview of the three key pillars of the Pritikin Program will be covered: eating well, doing regular exercise, and having a healthy mind-set.  WORKSHOPS  Exercise: Exercise Basics: Building Your Action Plan Clinical staff led group instruction and group discussion with PowerPoint presentation and patient guidebook. To enhance the learning environment the use of posters, models and videos may be added. At the conclusion of this workshop, patients will comprehend the difference between physical activity and exercise, as well as the benefits of incorporating both, into their routine. Patients will understand the FITT (Frequency, Intensity, Time, and Type) principle and how to use it to  build an exercise action plan. In addition, safety concerns and other considerations for exercise and cardiac rehab will be addressed by the presenter. The purpose of this lesson is to promote a comprehensive and effective weekly exercise routine in order to improve patients' overall level of fitness.   Managing Heart Disease: Your Path to a Healthier Heart Clinical staff led group instruction and group discussion with PowerPoint presentation and patient guidebook. To enhance the learning environment the use of posters, models and videos may be added.At the conclusion of this workshop, patients will understand the anatomy and physiology of the heart. Additionally, they will understand how Pritikin's three pillars impact the risk factors, the progression, and the management of heart disease.  The purpose of this lesson is to provide a high-level overview of the heart, heart disease, and how the Pritikin lifestyle positively impacts risk factors.  Exercise Biomechanics Clinical staff led group instruction and group discussion with PowerPoint presentation and patient guidebook. To enhance the learning environment the use of posters, models and videos may be added. Patients will learn how the structural parts of their bodies function and how these functions impact their daily activities, movement, and exercise. Patients will learn how to promote a neutral spine, learn how to manage pain, and identify ways to improve their physical movement in order to promote healthy living. The purpose of this lesson is to expose patients to common physical limitations that impact physical activity. Participants will learn practical ways to adapt and manage aches and pains, and to minimize their effect on regular exercise. Patients will learn how to maintain good posture while sitting, walking, and lifting.  Balance Training and Fall Prevention  Clinical staff led group instruction and group discussion with  PowerPoint presentation and patient guidebook. To enhance the learning environment the use of posters, models and videos may be added. At the conclusion of this workshop, patients will understand the importance of their sensorimotor skills (vision, proprioception, and the vestibular system) in maintaining their ability to balance as they age. Patients will apply a variety of balancing exercises that are appropriate for their current level of function. Patients will understand the common causes for poor balance, possible solutions to these problems, and  ways to modify their physical environment in order to minimize their fall risk. The purpose of this lesson is to teach patients about the importance of maintaining balance as they age and ways to minimize their risk of falling.  WORKSHOPS   Nutrition:  Fueling a Ship broker led group instruction and group discussion with PowerPoint presentation and patient guidebook. To enhance the learning environment the use of posters, models and videos may be added. Patients will review the foundational principles of the Pritikin Eating Plan and understand what constitutes a serving size in each of the food groups. Patients will also learn Pritikin-friendly foods that are better choices when away from home and review make-ahead meal and snack options. Calorie density will be reviewed and applied to three nutrition priorities: weight maintenance, weight loss, and weight gain. The purpose of this lesson is to reinforce (in a group setting) the key concepts around what patients are recommended to eat and how to apply these guidelines when away from home by planning and selecting Pritikin-friendly options. Patients will understand how calorie density may be adjusted for different weight management goals.  Mindful Eating  Clinical staff led group instruction and group discussion with PowerPoint presentation and patient guidebook. To enhance the learning  environment the use of posters, models and videos may be added. Patients will briefly review the concepts of the Pritikin Eating Plan and the importance of low-calorie dense foods. The concept of mindful eating will be introduced as well as the importance of paying attention to internal hunger signals. Triggers for non-hunger eating and techniques for dealing with triggers will be explored. The purpose of this lesson is to provide patients with the opportunity to review the basic principles of the Pritikin Eating Plan, discuss the value of eating mindfully and how to measure internal cues of hunger and fullness using the Hunger Scale. Patients will also discuss reasons for non-hunger eating and learn strategies to use for controlling emotional eating.  Targeting Your Nutrition Priorities Clinical staff led group instruction and group discussion with PowerPoint presentation and patient guidebook. To enhance the learning environment the use of posters, models and videos may be added. Patients will learn how to determine their genetic susceptibility to disease by reviewing their family history. Patients will gain insight into the importance of diet as part of an overall healthy lifestyle in mitigating the impact of genetics and other environmental insults. The purpose of this lesson is to provide patients with the opportunity to assess their personal nutrition priorities by looking at their family history, their own health history and current risk factors. Patients will also be able to discuss ways of prioritizing and modifying the Pritikin Eating Plan for their highest risk areas  Menu  Clinical staff led group instruction and group discussion with PowerPoint presentation and patient guidebook. To enhance the learning environment the use of posters, models and videos may be added. Using menus brought in from [employer], or printed from [employer], patients will apply the Pritikin dining out guidelines  that were presented in the [employer] video. Patients will also be able to practice these guidelines in a variety of provided scenarios. The purpose of this lesson is to provide patients with the opportunity to practice hands-on learning of the Pritikin Dining Out guidelines with actual menus and practice scenarios.  Label Reading Clinical staff led group instruction and group discussion with PowerPoint presentation and patient guidebook. To enhance the learning environment the use of posters, models and  videos may be added. Patients will review and discuss the [employer] label reading guidelines presented in [employer]'s Label Reading Educational series video. Using fool labels brought in from local grocery stores and markets, patients will apply the label reading guidelines and determine if the packaged food meet the Pritikin guidelines. The purpose of this lesson is to provide patients with the opportunity to review, discuss, and practice hands-on learning of the [employer] Label Reading guidelines with actual packaged food labels. Cooking School  [employer]'s [employer] are designed to teach patients ways to prepare quick, simple, and affordable recipes at home. The importance of nutrition's role in chronic disease risk reduction is reflected in its emphasis in the overall [employer]ikin program. By learning how to prepare essential core P[employer]ikin Eating Plan recipes, patients will increase control over what they eat; be able to customize the flavor of foods without the use of added salt, sugar, or fat; and improve the quality of the food they consume. By learning a set of core recipes which are easily assembled, quickly prepared, and affordable, patients are more likely to prepare more healthy foods at home. These workshops focus on convenient breakfasts, simple entres, side dishes, and desserts which can be prepared with minimal effort and are consistent with nutrition recommendations  for cardiovascular risk reduction. Cooking [employer] are taught by a Armed forces logistics/support/administrative officer (RD) who has been trained by the [employer]. The chef or RD has a clear understanding of the importance of minimizing - if not completely eliminating - added fat, sugar, and sodium in recipes. Throughout the series of Cooking School Workshop sessions, patients will learn about healthy ingredients and efficient methods of cooking to build confidence in their capability to prepare    Cooking School weekly topics:  Adding Flavor- Sodium-Free  Fast and Healthy Breakfasts  Powerhouse Plant-Based Proteins  Satisfying Salads and Dressings  Simple Sides and Sauces  International Cuisine-Spotlight on the [employer] Zones  Delicious Desserts  Savory Soups  [employer] - Meals in a Astronomer Appetizers and Snacks  Comforting Weekend Breakfasts  One-Pot Wonders   Fast Evening Meals  Landscape architect Your Pritikin Plate  WORKSHOPS   Healthy Mindset (Psychosocial):  Focused Goals, Sustainable Changes Clinical staff led group instruction and group discussion with PowerPoint presentation and patient guidebook. To enhance the learning environment the use of posters, models and videos may be added. Patients will be able to apply effective goal setting strategies to establish at least one personal goal, and then take consistent, meaningful action toward that goal. They will learn to identify common barriers to achieving personal goals and develop strategies to overcome them. Patients will also gain an understanding of how our mind-set can impact our ability to achieve goals and the importance of cultivating a positive and growth-oriented mind-set. The purpose of this lesson is to provide patients with a deeper understanding of how to set and achieve personal goals, as well as the tools and strategies needed to overcome common obstacles which may arise along the  way.  From Head to Heart: The Power of a Healthy Outlook  Clinical staff led group instruction and group discussion with PowerPoint presentation and patient guidebook. To enhance the learning environment the use of posters, models and videos may be added. Patients will be able to recognize and describe the impact of emotions and mood on physical health. They will discover the importance of self-care and explore self-care practices which may work for them. Patients  will also learn how to utilize the 4 C's to cultivate a healthier outlook and better manage stress and challenges. The purpose of this lesson is to demonstrate to patients how a healthy outlook is an essential part of maintaining good health, especially as they continue their cardiac rehab journey.  Healthy Sleep for a Healthy Heart Clinical staff led group instruction and group discussion with PowerPoint presentation and patient guidebook. To enhance the learning environment the use of posters, models and videos may be added. At the conclusion of this workshop, patients will be able to demonstrate knowledge of the importance of sleep to overall health, well-being, and quality of life. They will understand the symptoms of, and treatments for, common sleep disorders. Patients will also be able to identify daytime and nighttime behaviors which impact sleep, and they will be able to apply these tools to help manage sleep-related challenges. The purpose of this lesson is to provide patients with a general overview of sleep and outline the importance of quality sleep. Patients will learn about a few of the most common sleep disorders. Patients will also be introduced to the concept of "sleep hygiene," and discover ways to self-manage certain sleeping problems through simple daily behavior changes. Finally, the workshop will motivate patients by clarifying the links between quality sleep and their goals of heart-healthy living.   Recognizing and  Reducing Stress Clinical staff led group instruction and group discussion with PowerPoint presentation and patient guidebook. To enhance the learning environment the use of posters, models and videos may be added. At the conclusion of this workshop, patients will be able to understand the types of stress reactions, differentiate between acute and chronic stress, and recognize the impact that chronic stress has on their health. They will also be able to apply different coping mechanisms, such as reframing negative self-talk. Patients will have the opportunity to practice a variety of stress management techniques, such as deep abdominal breathing, progressive muscle relaxation, and/or guided imagery.  The purpose of this lesson is to educate patients on the role of stress in their lives and to provide healthy techniques for coping with it.  Learning Barriers/Preferences:  Learning Barriers/Preferences - 03/06/24 1340       Learning Barriers/Preferences   Learning Barriers Sight   wears glasses   Learning Preferences Video;Written Material;Pictoral;Computer/Internet          Education Topics:  Knowledge Questionnaire Score:  Knowledge Questionnaire Score - 03/06/24 1543       Knowledge Questionnaire Score   Pre Score 19/24          Core Components/Risk Factors/Patient Goals at Admission:  Personal Goals and Risk Factors at Admission - 03/06/24 1401       Core Components/Risk Factors/Patient Goals on Admission    Weight Management Weight Maintenance    Diabetes Yes    Intervention Provide education about signs/symptoms and action to take for hypo/hyperglycemia.;Provide education about proper nutrition, including hydration, and aerobic/resistive exercise prescription along with prescribed medications to achieve blood glucose in normal ranges: Fasting glucose 65-99 mg/dL    Expected Outcomes Short Term: Participant verbalizes understanding of the signs/symptoms and immediate care of  hyper/hypoglycemia, proper foot care and importance of medication, aerobic/resistive exercise and nutrition plan for blood glucose control.;Long Term: Attainment of HbA1C < 7%.    Hypertension Yes    Intervention Provide education on lifestyle modifcations including regular physical activity/exercise, weight management, moderate sodium restriction and increased consumption of fresh fruit, vegetables, and low fat dairy, alcohol moderation, and  smoking cessation.;Monitor prescription use compliance.    Expected Outcomes Short Term: Continued assessment and intervention until BP is < 140/47mm HG in hypertensive participants. < 130/55mm HG in hypertensive participants with diabetes, heart failure or chronic kidney disease.;Long Term: Maintenance of blood pressure at goal levels.    Lipids Yes    Intervention Provide education and support for participant on nutrition & aerobic/resistive exercise along with prescribed medications to achieve LDL 70mg , HDL >40mg .    Expected Outcomes Short Term: Participant states understanding of desired cholesterol values and is compliant with medications prescribed. Participant is following exercise prescription and nutrition guidelines.;Long Term: Cholesterol controlled with medications as prescribed, with individualized exercise RX and with personalized nutrition plan. Value goals: LDL < 70mg , HDL > 40 mg.    Stress Yes    Intervention Offer individual and/or small group education and counseling on adjustment to heart disease, stress management and health-related lifestyle change. Teach and support self-help strategies.;Refer participants experiencing significant psychosocial distress to appropriate mental health specialists for further evaluation and treatment. When possible, include family members and significant others in education/counseling sessions.    Expected Outcomes Short Term: Participant demonstrates changes in health-related behavior, relaxation and other stress  management skills, ability to obtain effective social support, and compliance with psychotropic medications if prescribed.;Long Term: Emotional wellbeing is indicated by absence of clinically significant psychosocial distress or social isolation.          Core Components/Risk Factors/Patient Goals Review:   Goals and Risk Factor Review     Row Name 03/12/24 1740 03/27/24 1525           Core Components/Risk Factors/Patient Goals Review   Personal Goals Review Weight Management/Obesity;Lipids;Stress;Hypertension;Diabetes Weight Management/Obesity;Lipids;Stress;Hypertension;Diabetes      Review Araiyah started cardiac rehab on 03/12/24. Amairany did well with exercise. Vital signs and CBG's were stable. Jesselyn is off to a good start  with exercise at cardiac rehab. . Vital signs and CBG's have been stable.      Expected Outcomes Caliyah will continue to particiapate in cardiac rehab for exercise, nutrition and lifestyle modifications. Kimberely will continue to particiapate in cardiac rehab for exercise, nutrition and lifestyle modifications.         Core Components/Risk Factors/Patient Goals at Discharge (Final Review):   Goals and Risk Factor Review - 03/27/24 1525       Core Components/Risk Factors/Patient Goals Review   Personal Goals Review Weight Management/Obesity;Lipids;Stress;Hypertension;Diabetes    Review Makinsley is off to a good start  with exercise at cardiac rehab. . Vital signs and CBG's have been stable.    Expected Outcomes Aevah will continue to particiapate in cardiac rehab for exercise, nutrition and lifestyle modifications.          ITP Comments:  ITP Comments     Row Name 03/06/24 1322 03/12/24 1735 03/27/24 1514       ITP Comments Wilbert Bihari, MD: Medical Director. Introduction to the [employer] / Intensive Cardiac Rehab. Initial orienation packet reviewed with the patient. 30 Day ITP Review. Wanna started cardiac rehab on  03/12/24. Lara did well with exercise. 30 Day ITP Review. Sherita is off to a good start with exercise at cardiac rehab        Comments: See ITP comments

## 2024-03-28 ENCOUNTER — Ambulatory Visit (HOSPITAL_COMMUNITY)

## 2024-03-28 ENCOUNTER — Encounter (HOSPITAL_COMMUNITY)
Admission: RE | Admit: 2024-03-28 | Discharge: 2024-03-28 | Disposition: A | Discharge status: Home / Self Care | Source: Ambulatory Visit | Attending: Cardiology

## 2024-03-28 DIAGNOSIS — I214 Non-ST elevation (NSTEMI) myocardial infarction: Secondary | ICD-10-CM

## 2024-03-28 DIAGNOSIS — Z48812 Encounter for surgical aftercare following surgery on the circulatory system: Secondary | ICD-10-CM | POA: Diagnosis not present

## 2024-03-30 ENCOUNTER — Telehealth (HOSPITAL_COMMUNITY): Payer: Self-pay

## 2024-03-30 ENCOUNTER — Ambulatory Visit (HOSPITAL_COMMUNITY)

## 2024-03-30 ENCOUNTER — Encounter (HOSPITAL_COMMUNITY): Admission: RE | Admit: 2024-03-30 | Source: Ambulatory Visit

## 2024-03-30 NOTE — Telephone Encounter (Signed)
 Patient c/o for 1:45 CR class at 1:52pm, states she is not feeling well. Confirmed she has no flu/COVID symptoms.

## 2024-04-02 ENCOUNTER — Encounter (HOSPITAL_COMMUNITY)
Admission: RE | Admit: 2024-04-02 | Discharge: 2024-04-02 | Disposition: A | Discharge status: Home / Self Care | Source: Ambulatory Visit | Attending: Cardiology

## 2024-04-02 ENCOUNTER — Telehealth: Payer: Self-pay | Admitting: Cardiology

## 2024-04-02 ENCOUNTER — Ambulatory Visit (HOSPITAL_COMMUNITY)

## 2024-04-02 DIAGNOSIS — I214 Non-ST elevation (NSTEMI) myocardial infarction: Secondary | ICD-10-CM

## 2024-04-02 DIAGNOSIS — Z48812 Encounter for surgical aftercare following surgery on the circulatory system: Secondary | ICD-10-CM | POA: Diagnosis not present

## 2024-04-02 MED ORDER — ISOSORBIDE MONONITRATE ER 30 MG PO TB24: 15.0000 mg | ORAL_TABLET | Freq: Every day | ORAL | 1 refills | Status: DC

## 2024-04-02 MED ORDER — CLOPIDOGREL BISULFATE 75 MG PO TABS: 75.0000 mg | ORAL_TABLET | Freq: Every day | ORAL | 1 refills | Status: DC

## 2024-04-02 MED ORDER — ASPIRIN 81 MG PO TBEC: 81.0000 mg | DELAYED_RELEASE_TABLET | Freq: Every day | ORAL | 1 refills | Status: AC

## 2024-04-02 NOTE — Telephone Encounter (Signed)
*  STAT* If patient is at the pharmacy, call can be transferred to refill team.   1. Which medications need to be refilled? (please list name of each medication and dose if known) aspirin  EC 81 MG tablet   clopidogrel  (PLAVIX ) 75 MG tablet    isosorbide  mononitrate (IMDUR ) 30 MG 24 hr tablet    2. Which pharmacy/location (including street and city if local pharmacy) is medication to be sent to? [id number] - [place], [place] - [address] AT [address] OF [facility], [place] [address]   3. Do they need a 30 day or 90 day supply? 90

## 2024-04-02 NOTE — Telephone Encounter (Signed)
 Refills has been sent to the pharmacy.

## 2024-04-04 ENCOUNTER — Encounter (HOSPITAL_COMMUNITY)
Admission: RE | Admit: 2024-04-04 | Discharge: 2024-04-04 | Disposition: A | Discharge status: Home / Self Care | Source: Ambulatory Visit | Attending: Cardiology | Admitting: Cardiology

## 2024-04-04 ENCOUNTER — Ambulatory Visit (HOSPITAL_COMMUNITY)

## 2024-04-04 DIAGNOSIS — I214 Non-ST elevation (NSTEMI) myocardial infarction: Secondary | ICD-10-CM

## 2024-04-04 DIAGNOSIS — Z48812 Encounter for surgical aftercare following surgery on the circulatory system: Secondary | ICD-10-CM | POA: Diagnosis not present

## 2024-04-05 ENCOUNTER — Encounter (HOSPITAL_COMMUNITY)
Admission: RE | Admit: 2024-04-05 | Discharge: 2024-04-05 | Disposition: A | Discharge status: Home / Self Care | Source: Ambulatory Visit | Attending: Hematology | Admitting: Hematology

## 2024-04-05 DIAGNOSIS — C7A8 Other malignant neuroendocrine tumors: Secondary | ICD-10-CM | POA: Diagnosis present

## 2024-04-05 MED ORDER — COPPER CU 64 DOTATATE 1 MCI/ML IV SOLN
4.0000 | Freq: Once | INTRAVENOUS | Status: AC
  Administered 2024-04-05: 3.97 via INTRAVENOUS

## 2024-04-06 ENCOUNTER — Encounter (HOSPITAL_COMMUNITY)
Admission: RE | Admit: 2024-04-06 | Discharge: 2024-04-06 | Disposition: A | Discharge status: Home / Self Care | Source: Ambulatory Visit | Attending: Cardiology | Admitting: Cardiology

## 2024-04-06 ENCOUNTER — Ambulatory Visit (HOSPITAL_COMMUNITY)

## 2024-04-06 DIAGNOSIS — I214 Non-ST elevation (NSTEMI) myocardial infarction: Secondary | ICD-10-CM

## 2024-04-06 DIAGNOSIS — Z48812 Encounter for surgical aftercare following surgery on the circulatory system: Secondary | ICD-10-CM | POA: Diagnosis not present

## 2024-04-09 ENCOUNTER — Encounter (HOSPITAL_COMMUNITY)
Admission: RE | Admit: 2024-04-09 | Discharge: 2024-04-09 | Disposition: A | Discharge status: Home / Self Care | Source: Ambulatory Visit | Attending: Cardiology

## 2024-04-09 ENCOUNTER — Ambulatory Visit (HOSPITAL_COMMUNITY)

## 2024-04-09 DIAGNOSIS — Z48812 Encounter for surgical aftercare following surgery on the circulatory system: Secondary | ICD-10-CM | POA: Diagnosis not present

## 2024-04-09 DIAGNOSIS — I214 Non-ST elevation (NSTEMI) myocardial infarction: Secondary | ICD-10-CM

## 2024-04-11 ENCOUNTER — Encounter (HOSPITAL_COMMUNITY)
Admission: RE | Admit: 2024-04-11 | Discharge: 2024-04-11 | Disposition: A | Discharge status: Home / Self Care | Source: Ambulatory Visit | Attending: Cardiology

## 2024-04-11 ENCOUNTER — Ambulatory Visit (HOSPITAL_COMMUNITY)

## 2024-04-11 DIAGNOSIS — Z48812 Encounter for surgical aftercare following surgery on the circulatory system: Secondary | ICD-10-CM | POA: Diagnosis not present

## 2024-04-11 DIAGNOSIS — I214 Non-ST elevation (NSTEMI) myocardial infarction: Secondary | ICD-10-CM

## 2024-04-12 ENCOUNTER — Other Ambulatory Visit (HOSPITAL_COMMUNITY)

## 2024-04-13 ENCOUNTER — Encounter (HOSPITAL_COMMUNITY)
Admission: RE | Admit: 2024-04-13 | Discharge: 2024-04-13 | Disposition: A | Discharge status: Home / Self Care | Source: Ambulatory Visit | Attending: Cardiology | Admitting: Cardiology

## 2024-04-13 ENCOUNTER — Ambulatory Visit (HOSPITAL_COMMUNITY)

## 2024-04-13 DIAGNOSIS — I252 Old myocardial infarction: Secondary | ICD-10-CM | POA: Insufficient documentation

## 2024-04-13 DIAGNOSIS — Z5189 Encounter for other specified aftercare: Secondary | ICD-10-CM | POA: Diagnosis not present

## 2024-04-13 DIAGNOSIS — I214 Non-ST elevation (NSTEMI) myocardial infarction: Secondary | ICD-10-CM | POA: Diagnosis present

## 2024-04-16 ENCOUNTER — Ambulatory Visit (HOSPITAL_COMMUNITY)

## 2024-04-16 ENCOUNTER — Encounter (HOSPITAL_COMMUNITY)
Admission: RE | Admit: 2024-04-16 | Discharge: 2024-04-16 | Disposition: A | Discharge status: Home / Self Care | Source: Ambulatory Visit | Attending: Cardiology

## 2024-04-16 DIAGNOSIS — I214 Non-ST elevation (NSTEMI) myocardial infarction: Secondary | ICD-10-CM

## 2024-04-16 DIAGNOSIS — I252 Old myocardial infarction: Secondary | ICD-10-CM | POA: Diagnosis not present

## 2024-04-17 ENCOUNTER — Other Ambulatory Visit (HOSPITAL_COMMUNITY)

## 2024-04-18 ENCOUNTER — Encounter (HOSPITAL_COMMUNITY)
Admission: RE | Admit: 2024-04-18 | Discharge: 2024-04-18 | Disposition: A | Discharge status: Home / Self Care | Source: Ambulatory Visit | Attending: Cardiology

## 2024-04-18 ENCOUNTER — Ambulatory Visit (HOSPITAL_COMMUNITY)

## 2024-04-18 DIAGNOSIS — I252 Old myocardial infarction: Secondary | ICD-10-CM | POA: Diagnosis not present

## 2024-04-18 DIAGNOSIS — I214 Non-ST elevation (NSTEMI) myocardial infarction: Secondary | ICD-10-CM

## 2024-04-18 NOTE — Assessment & Plan Note (Addendum)
 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  Liver biopsy was attempted, but the liver lesion was not visible on ultrasound. -She started Sandostatin  injection in June 2022.  Overall tolerating well. Will continue.  -last restaging CT on 09/07/2022 showed stable disease  -She is clinically doing well overall, still has intermittent pain on the left side of abdomen, overall better than last year. -She has developed hyperglycemia, possibly related to Sandostatin  injection, she is on metformin  now.  Discussed diabetic diet, and I encouraged her to exercise. 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  Treatment was subsequently stopped. - Restaging dotatate PET scan on April 05, 2024 showed improved peritoneal metastasis, and similar hypermetabolic liver met.  No new lesions. -Continue Sandostatin  injection monthly

## 2024-04-19 ENCOUNTER — Inpatient Hospital Stay

## 2024-04-19 ENCOUNTER — Encounter: Payer: Self-pay | Admitting: Hematology

## 2024-04-19 ENCOUNTER — Inpatient Hospital Stay: Attending: Hematology

## 2024-04-19 ENCOUNTER — Inpatient Hospital Stay (HOSPITAL_BASED_OUTPATIENT_CLINIC_OR_DEPARTMENT_OTHER): Admitting: Hematology

## 2024-04-19 VITALS — BP 116/81 | HR 69 | Temp 97.7°F | Resp 14 | Ht 63.0 in | Wt 179.2 lb

## 2024-04-19 DIAGNOSIS — C7A019 Malignant carcinoid tumor of the small intestine, unspecified portion: Secondary | ICD-10-CM

## 2024-04-19 DIAGNOSIS — C7A8 Other malignant neuroendocrine tumors: Secondary | ICD-10-CM | POA: Diagnosis present

## 2024-04-19 DIAGNOSIS — C7B8 Other secondary neuroendocrine tumors: Secondary | ICD-10-CM | POA: Diagnosis present

## 2024-04-19 DIAGNOSIS — Z79899 Other long term (current) drug therapy: Secondary | ICD-10-CM | POA: Insufficient documentation

## 2024-04-19 LAB — CBC WITH DIFFERENTIAL (CANCER CENTER ONLY)
Abs Immature Granulocytes: 0.01 K/uL (ref 0.00–0.07)
Basophils Absolute: 0 K/uL (ref 0.0–0.1)
Basophils Relative: 1 %
Eosinophils Absolute: 0.1 K/uL (ref 0.0–0.5)
Eosinophils Relative: 3 %
HCT: 37.9 % (ref 36.0–46.0)
Hemoglobin: 12.3 g/dL (ref 12.0–15.0)
Immature Granulocytes: 0 %
Lymphocytes Relative: 28 %
Lymphs Abs: 0.9 K/uL (ref 0.7–4.0)
MCH: 29.1 pg (ref 26.0–34.0)
MCHC: 32.5 g/dL (ref 30.0–36.0)
MCV: 89.6 fL (ref 80.0–100.0)
Monocytes Absolute: 0.3 K/uL (ref 0.1–1.0)
Monocytes Relative: 9 %
Neutro Abs: 2 K/uL (ref 1.7–7.7)
Neutrophils Relative %: 59 %
Platelet Count: 272 K/uL (ref 150–400)
RBC: 4.23 MIL/uL (ref 3.87–5.11)
RDW: 12.7 % (ref 11.5–15.5)
WBC Count: 3.4 K/uL — ABNORMAL LOW (ref 4.0–10.5)
nRBC: 0 % (ref 0.0–0.2)

## 2024-04-19 LAB — CMP (CANCER CENTER ONLY)
ALT: 14 U/L (ref 0–44)
AST: 18 U/L (ref 15–41)
Albumin: 4 g/dL (ref 3.5–5.0)
Alkaline Phosphatase: 56 U/L (ref 38–126)
Anion gap: 4 — ABNORMAL LOW (ref 5–15)
BUN: 10 mg/dL (ref 8–23)
CO2: 32 mmol/L (ref 22–32)
Calcium: 9.1 mg/dL (ref 8.9–10.3)
Chloride: 103 mmol/L (ref 98–111)
Creatinine: 0.87 mg/dL (ref 0.44–1.00)
GFR, Estimated: >60 mL/min (ref >60)
Glucose, Bld: 173 mg/dL — ABNORMAL HIGH (ref 70–99)
Potassium: 4 mmol/L (ref 3.5–5.1)
Sodium: 139 mmol/L (ref 135–145)
Total Bilirubin: 0.6 mg/dL (ref 0.0–1.2)
Total Protein: 6.8 g/dL (ref 6.5–8.1)

## 2024-04-19 MED ORDER — OCTREOTIDE ACETATE 30 MG IM KIT
30.0000 mg | PACK | Freq: Once | INTRAMUSCULAR | Status: AC
Start: 2024-04-19 — End: 2024-04-19
  Administered 2024-04-19: 30 mg via INTRAMUSCULAR
  Filled 2024-04-19: qty 1

## 2024-04-19 NOTE — Progress Notes (Signed)
 [facility]   Telephone:(336) [phone] Fax:(336) [phone]   Clinic Follow up Note   Patient Care Team: [name], MD as PCP - General (Family Medicine) Shlomo Wilbert SAUNDERS, MD as PCP - Sleep Medicine (Cardiology) Skeet Juliene SAUNDERS, DO as Consulting Physician (Neurology) Burton, [name], NP as Nurse Practitioner (Nurse Practitioner) Lanny Callander, MD as Consulting Physician (Oncology) Skeet Juliene SAUNDERS, DO as Consulting Physician (Neurology)  Date of Service:  04/19/2024  CHIEF COMPLAINT: f/u of metastatic neuroendocrine tumor  CURRENT THERAPY:  Sandostatin  injection monthly  Oncology History   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  Liver biopsy was attempted, but the liver lesion was not visible on ultrasound. -She started Sandostatin  injection in June 2022.  Overall tolerating well. Will continue.  -last restaging CT on 09/07/2022 showed stable disease  -She is clinically doing well overall, still has intermittent pain on the left side of abdomen, overall better than last year. -She has developed hyperglycemia, possibly related to Sandostatin  injection, she is on metformin  now.  Discussed diabetic diet, and I encouraged her to exercise. 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  Treatment was subsequently stopped. - Restaging dotatate PET scan on April 05, 2024 showed improved peritoneal metastasis, and similar hypermetabolic liver met.  No new lesions. -Continue Sandostatin  injection monthly  Assessment & Plan Neuroendocrine tumor of abdomen and liver PET scan indicates  improvement in abdominal disease and stable liver disease with no new lesions. Partial response to Lutathera  treatment, though incomplete. Future progression may require oral medication such as Afinitor.  - Continue Sandostatin  injections monthly -Follow-up scan every 6 months - Consider oral medication if disease progression occurs  Epistaxis secondary to anticoagulation therapy Epistaxis occurs one to two times a week, likely due to anticoagulation therapy post-myocardial infarction. - Advise applying pressure during episodes of epistaxis - Recommend avoiding nasal picking - Suggest using nasal sprays or Vaseline for nasal passage care  History of myocardial infarction Intermittent chest discomfort resembling heartburn, sometimes relieved by nitroglycerin . No recent hypoglycemia except one episode during cardiac rehab, managed with crackers and juice. No significant abdominal pain reported. 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 Estelita Manas Impression - Normal upper third of esophagus, middle third of esophagus and lower third of esophagus. - Z-line regular, 38 cm from the incisors. - Erythematous mucosa in the antrum. Biopsied. Clip (MR conditional) was placed. - Normal examined duodenum. Biopsied.   12/10/2020 Pathology Results   FINAL MICROSCOPIC DIAGNOSIS:   A.  DUODENUM, BIOPSY:  - Duodenal mucosa with no significant pathologic findings.  -  Negative for increased intraepithelial lymphocytes and villous  architectural changes.   B. STOMACH, ANTRUM, BIOPSY:  - Reactive gastropathy.  - Warthin-Starry stain is negative for Helicobacter pylori.    12/11/2020 Procedure   Colonoscopy, Dr. Estelita Manas impression - One 5 mm polyp in the transverse colon, removed piecemeal using a cold biopsy forceps. Resected and retrieved. - Diverticulosis in the sigmoid colon, in the descending colon and in the transverse colon. - The examined portion of the ileum was normal. - Non-bleeding internal hemorrhoids.   12/11/2020 Pathology Results   FINAL MICROSCOPIC DIAGNOSIS:   A. COLON, TRANSVERSE, POLYPECTOMY:  - Tubular adenoma.  - Negative for high grade dysplasia.    12/24/2020 Procedure   Capsule endoscopy report: A polypoid nodular growth was noted at 3 hours and 30 minutes and an ulcerated, polypoid growth was noted at 3 hours and 54 minutes which is likely the cause of obscure GI blood loss   01/23/2021 Imaging   CT entero AP w contrast IMPRESSION: 1. No acute findings identified within the abdomen or pelvis. 2. There are several focal areas of intraluminal hyperenhancement within the small bowel loops. The largest is in the nondilated mid to distal jejunum measuring 1.3 cm. Cannot rule out underlying small bowel neoplasm. Correlation with tissue sampling results advised. Correlation with results from capsule endoscopy. 3. There is a enhancing soft tissue nodule with central calcification within the central small bowel mesentery. This is nonspecific and may represent sequelae of inflammation/infection. Primary differential considerations include carcinoid tumor or metastatic adenopathy. If there is a clinical concern for carcinoid tumor consider further investigation with DOTATATE PET. 4. Left adrenal gland adenoma. 5. Distal colonic diverticulosis  without signs of acute diverticulitis. 6. Aortic atherosclerosis.   02/16/2021 PET scan   IMPRESSION: 1. Evidence of well differentiated small bowel neuroendocrine tumor with mesenteric metastasis. 2. Approximately 7 discrete small bowel lesions with intense radiotracer activity. Lesion depicted on comparison CT enterography. 3. Two intensely radiotracer avid mesenteric implants centrally within the small bowel mesentery. 4. Single hepatic metastasis with a second potential hepatic metastasis versus peritoneal implant. 5. No small bowel obstruction. 6. No evidence of metastatic disease outside the abdomen pelvis. 7. Patient may be a candidate for peptide receptor radiotherapy (Lu-177 DOTATATE) available at [facility] molecular imaging department [phone]).   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 No new or progressive interval findings. 2. Stable appearance of the 17 mm nodule central mesenteric nodule seen to be hypermetabolic on recent PET-CT. The second mesenteric lesion identified on that study is not discernible today. 3. Stable 1.5 cm subcapsular low-density lesion posterior right liver since PET-CT 02/16/2021. 4. 2 small bowel nodules are identified on imaging today although 7 discrete hypermetabolic small bowel lesions were seen on previous PET-CT. 5. Left colonic diverticulosis without diverticulitis. 6. Aortic Atherosclerosis (ICD10-I70.0).   02/22/2022 Imaging   EXAM: CT ABDOMEN AND PELVIS WITH CONTRAST  IMPRESSION: 1. Mural soft tissue nodule of the small bowel in the  midline ventral abdomen is unchanged, as is a small adjacent metastatic mesenteric nodule. 2. No evidence of new metastatic disease in the abdomen or pelvis. 3. Diverticulosis  without evidence of acute diverticulitis. 4. Coronary artery disease.   08/25/2023 Imaging   CT chest, abdomen, and pelvis with contrast  IMPRESSION: 1. Increased size of the neuroendocrine metastasis measuring 2.1 cm in the right hepatic lobe. 2. Stable enhancing nonobstructive small bowel lesions. 3. Stable mesenteric nodule. 4. Stable tiny soft tissue nodules in the bilateral posterior gluteal subcutaneous space.      Discussed the use of AI scribe software for clinical note transcription with the patient, who gave verbal consent to proceed.  History of Present Illness Tanya Walsh is a 68 year old female with a neuroendocrine tumor who presents for follow-up.  She experiences epistaxis one to two times a week, which she attributes to anticoagulation therapy following a myocardial infarction. She manages the bleeding by applying pressure and occasionally using Vaseline or nasal spray.  She occasionally experiences pain and discomfort, described as similar to heartburn or the sensation during her myocardial infarction. She sometimes uses nitroglycerin  to alleviate these symptoms and keeps heartburn medication on hand.     All other systems were reviewed with the patient and are negative.  MEDICAL HISTORY:  Past Medical History:  Diagnosis Date   Coronary artery disease    Depression    GI bleed 07/17/2015   Hypertension    Myocardial infarction [facility])    Nocturia 08/14/2019   Refusal of blood transfusions as patient is Jehovah's Witness     SURGICAL HISTORY: Past Surgical History:  Procedure Laterality Date   ABDOMINAL HYSTERECTOMY     BACK SURGERY     BIOPSY  12/10/2020   Procedure: BIOPSY;  Surgeon: Saintclair Jasper, MD;  Location: [facility] ENDOSCOPY;  Service: Gastroenterology;;   BIOPSY  12/11/2020    Procedure: BIOPSY;  Surgeon: Saintclair Jasper, MD;  Location: [facility] ENDOSCOPY;  Service: Gastroenterology;;   COLONOSCOPY WITH PROPOFOL  N/A 12/11/2020   Procedure: COLONOSCOPY WITH PROPOFOL ;  Surgeon: Saintclair Jasper, MD;  Location: [facility] ENDOSCOPY;  Service: Gastroenterology;  Laterality: N/A;   ESOPHAGOGASTRODUODENOSCOPY N/A 07/17/2015   Procedure: ESOPHAGOGASTRODUODENOSCOPY (EGD);  Surgeon: Lesta JULIANNA Fitz, MD;  Location[facility] ENDOSCOPY;  Service: Endoscopy;  Laterality: N/A;   ESOPHAGOGASTRODUODENOSCOPY (EGD) WITH PROPOFOL  N/A 12/10/2020   Procedure: ESOPHAGOGASTRODUODENOSCOPY (EGD) WITH PROPOFOL ;  Surgeon: Saintclair Jasper, MD;  Location: [facility] ENDOSCOPY;  Service: Gastroenterology;  Laterality: N/A;   HEMOSTASIS CLIP PLACEMENT  12/10/2020   Procedure: HEMOSTASIS CLIP PLACEMENT;  Surgeon: Saintclair Jasper, MD;  Location: [facility] ENDOSCOPY;  Service: Gastroenterology;;   LEFT HEART CATH AND CORONARY ANGIOGRAPHY N/A 12/30/2023   Procedure: LEFT HEART CATH AND CORONARY ANGIOGRAPHY;  Surgeon: [place]az[place], Peter M, MD;  Location: [facility] INVASIVE CV LAB;  Service: Cardiovascular;  Laterality: N/A;    I have reviewed the social history and family history with the patient and they are unchanged from previous note.  ALLERGIES:  is allergic to bactrim [sulfamethoxazole-trimethoprim].  MEDICATIONS:  Current Outpatient Medications  Medication Sig Dispense Refill   acetaminophen  (TYLENOL ) 650 MG CR tablet Take 650 mg by mouth every 8 (eight) hours as needed for pain.     aspirin  EC 81 MG tablet Take 1 tablet (81 mg total) by mouth daily. Swallow whole. 90 tablet 1   clopidogrel  (PLAVIX ) 75 MG tablet Take 1 tablet (75 mg total) by mouth daily. 90 tablet 1   cyclobenzaprine  (FLEXERIL ) 5 MG tablet Take 1 tablet (5 mg total) by mouth 3 (three) times daily as needed for muscle spasms. 30 tablet 0   DULoxetine  (CYMBALTA ) 60 MG capsule Take 60 mg by mouth in  the morning.     fluticasone (FLONASE) 50 MCG/ACT nasal spray Place 1 spray into both nostrils  as needed for allergies or rhinitis.     folic acid  (FOLVITE ) 800 MCG tablet Take 400 mcg by mouth in the morning.     gabapentin  (NEURONTIN ) 100 MG capsule Take 1 capsule (100 mg total) by mouth at bedtime. 30 capsule 5   isosorbide  mononitrate (IMDUR ) 30 MG 24 hr tablet Take 0.5 tablets (15 mg total) by mouth daily. Can take a whole tablet if needed 90 tablet 1   losartan -hydrochlorothiazide  (HYZAAR) 50-12.5 MG tablet Take 1 tablet by mouth in the morning.     metFORMIN  (GLUCOPHAGE -XR) 500 MG 24 hr tablet Take 500 mg by mouth daily with breakfast.     Multiple Vitamins-Minerals (MULTIVITAMIN ADULTS 50+) TABS Take 1 tablet by mouth daily.     nitroGLYCERIN  (NITROSTAT ) 0.4 MG SL tablet Place 1 tablet (0.4 mg total) under the tongue every 5 (five) minutes as needed for chest pain. 30 tablet 0   ondansetron  (ZOFRAN ) 8 MG tablet Take 1 tablet (8 mg total) by mouth 2 (two) times daily as needed for nausea or vomiting. 20 tablet 0   pantoprazole  (PROTONIX ) 40 MG tablet Take 40 mg by mouth in the morning.     rosuvastatin  (CRESTOR ) 20 MG tablet Take 20 mg by mouth in the morning.     traMADol  (ULTRAM ) 50 MG tablet Take 1 tablet (50 mg total) by mouth every 12 (twelve) hours as needed. 20 tablet 0   trolamine salicylate (ASPERCREME) 10 % cream Apply 1 application topically as needed for muscle pain.     No current facility-administered medications for this visit.    PHYSICAL EXAMINATION: ECOG PERFORMANCE STATUS: 1 - Symptomatic but completely ambulatory  Vitals:   04/19/24 1143  BP: 116/81  Pulse: 69  Resp: 14  Temp: 97.7 F (36.5 C)  SpO2: 100%   Wt Readings from Last 3 Encounters:  04/19/24 179 lb 3.2 oz (81.3 kg)  03/20/24 178 lb (80.7 kg)  03/06/24 180 lb 12.4 oz (82 kg)     GENERAL:alert, no distress and comfortable SKIN: skin color, texture, turgor are normal, no rashes or significant lesions EYES: normal, Conjunctiva are pink and non-injected, sclera clear NECK: supple, thyroid   normal size, non-tender, without nodularity LYMPH:  no palpable lymphadenopathy in the cervical, axillary  LUNGS: clear to auscultation and percussion with normal breathing effort HEART: regular rate & rhythm and no murmurs and no lower extremity edema ABDOMEN:abdomen soft, non-tender and normal bowel sounds Musculoskeletal:no cyanosis of digits and no clubbing  NEURO: alert & oriented x 3 with fluent speech, no focal motor/sensory deficits  Physical Exam    LABORATORY DATA:  I have reviewed the data as listed    Latest Ref Rng & Units 04/19/2024   11:29 AM 03/20/2024   12:37 PM 02/21/2024    8:11 AM  CBC  WBC 4.0 - 10.5 K/uL 3.4  4.4  3.3   Hemoglobin 12.0 - 15.0 g/dL 87.6  87.9  87.6   Hematocrit 36.0 - 46.0 % 37.9  36.5  39.0   Platelets 150 - 400 K/uL 272  240  272         Latest Ref Rng & Units 04/19/2024   11:29 AM 03/20/2024   12:37 PM 02/21/2024    8:11 AM  CMP  Glucose 70 - 99 mg/dL 826  802  802   BUN 8 - 23 mg/dL 10  8  8  Creatinine 0.44 - 1.00 mg/dL 9.12  9.10  9.10   Sodium 135 - 145 mmol/L 139  141  136   Potassium 3.5 - 5.1 mmol/L 4.0  3.5  3.1   Chloride 98 - 111 mmol/L 103  104  103   CO2 22 - 32 mmol/L 32  31  25   Calcium  8.9 - 10.3 mg/dL 9.1  9.2  8.8   Total Protein 6.5 - 8.1 g/dL 6.8  6.8  7.1   Total Bilirubin 0.0 - 1.2 mg/dL 0.6  0.6  0.9   Alkaline Phos 38 - 126 U/L 56  56  55   AST 15 - 41 U/L 18  18  24    ALT 0 - 44 U/L 14  18  19        RADIOGRAPHIC STUDIES: I have personally reviewed the radiological images as listed and agreed with the findings in the report. No results found.    No orders of the defined types were placed in this encounter.  All questions were answered. The patient knows to call the clinic with any problems, questions or concerns. No barriers to learning was detected. The total time spent in the appointment was 25 minutes, including review of chart and various tests results, discussions about plan of care and coordination  of care plan     Onita Mattock, MD 04/19/2024

## 2024-04-20 ENCOUNTER — Ambulatory Visit (HOSPITAL_COMMUNITY)

## 2024-04-20 ENCOUNTER — Encounter (HOSPITAL_COMMUNITY)
Admission: RE | Admit: 2024-04-20 | Discharge: 2024-04-20 | Disposition: A | Discharge status: Home / Self Care | Source: Ambulatory Visit | Attending: Cardiology | Admitting: Cardiology

## 2024-04-20 DIAGNOSIS — I252 Old myocardial infarction: Secondary | ICD-10-CM | POA: Diagnosis not present

## 2024-04-20 DIAGNOSIS — I214 Non-ST elevation (NSTEMI) myocardial infarction: Secondary | ICD-10-CM

## 2024-04-23 ENCOUNTER — Encounter (HOSPITAL_COMMUNITY): Admission: RE | Admit: 2024-04-23 | Source: Ambulatory Visit

## 2024-04-23 ENCOUNTER — Ambulatory Visit (HOSPITAL_COMMUNITY)

## 2024-04-23 NOTE — Progress Notes (Signed)
 Cardiac Individual Treatment Plan  Patient Details  Name: Tanya Walsh MRN: [id number] Date of Birth: 1955/12/31 Referring Provider:   Flowsheet Row INTENSIVE CARDIAC REHAB ORIENT from 03/06/2024 in [facility]  Referring Provider Wilbert Bihari, MD    Initial Encounter Date:  Flowsheet Row INTENSIVE CARDIAC REHAB ORIENT from 03/06/2024 in [facility]  Date 03/06/24    Visit Diagnosis: 12/29/23 NSTEMI (non-ST elevated myocardial infarction) [facility])  Patient's Home Medications on Admission:  Current Outpatient Medications:    acetaminophen  (TYLENOL ) 650 MG CR tablet, Take 650 mg by mouth every 8 (eight) hours as needed for pain., Disp: , Rfl:    aspirin  EC 81 MG tablet, Take 1 tablet (81 mg total) by mouth daily. Swallow whole., Disp: 90 tablet, Rfl: 1   clopidogrel  (PLAVIX ) 75 MG tablet, Take 1 tablet (75 mg total) by mouth daily., Disp: 90 tablet, Rfl: 1   cyclobenzaprine  (FLEXERIL ) 5 MG tablet, Take 1 tablet (5 mg total) by mouth 3 (three) times daily as needed for muscle spasms., Disp: 30 tablet, Rfl: 0   DULoxetine  (CYMBALTA ) 60 MG capsule, Take 60 mg by mouth in the morning., Disp: , Rfl:    fluticasone (FLONASE) 50 MCG/ACT nasal spray, Place 1 spray into both nostrils as needed for allergies or rhinitis., Disp: , Rfl:    folic acid  (FOLVITE ) 800 MCG tablet, Take 400 mcg by mouth in the morning., Disp: , Rfl:    gabapentin  (NEURONTIN ) 100 MG capsule, Take 1 capsule (100 mg total) by mouth at bedtime., Disp: 30 capsule, Rfl: 5   isosorbide  mononitrate (IMDUR ) 30 MG 24 hr tablet, Take 0.5 tablets (15 mg total) by mouth daily. Can take a whole tablet if needed, Disp: 90 tablet, Rfl: 1   losartan -hydrochlorothiazide  (HYZAAR) 50-12.5 MG tablet, Take 1 tablet by mouth in the morning., Disp: , Rfl:    metFORMIN  (GLUCOPHAGE -XR) 500 MG 24 hr tablet, Take 500 mg by mouth daily  with breakfast., Disp: , Rfl:    Multiple Vitamins-Minerals (MULTIVITAMIN ADULTS 50+) TABS, Take 1 tablet by mouth daily., Disp: , Rfl:    nitroGLYCERIN  (NITROSTAT ) 0.4 MG SL tablet, Place 1 tablet (0.4 mg total) under the tongue every 5 (five) minutes as needed for chest pain., Disp: 30 tablet, Rfl: 0   ondansetron  (ZOFRAN ) 8 MG tablet, Take 1 tablet (8 mg total) by mouth 2 (two) times daily as needed for nausea or vomiting., Disp: 20 tablet, Rfl: 0   pantoprazole  (PROTONIX ) 40 MG tablet, Take 40 mg by mouth in the morning., Disp: , Rfl:    rosuvastatin  (CRESTOR ) 20 MG tablet, Take 20 mg by mouth in the morning., Disp: , Rfl:    traMADol  (ULTRAM ) 50 MG tablet, Take 1 tablet (50 mg total) by mouth every 12 (twelve) hours as needed., Disp: 20 tablet, Rfl: 0   trolamine salicylate (ASPERCREME) 10 % cream, Apply 1 application topically as needed for muscle pain., Disp: , Rfl:   Past Medical History: Past Medical History:  Diagnosis Date   Coronary artery disease    Depression    GI bleed 07/17/2015   Hypertension    Myocardial infarction [facility])    Nocturia 08/14/2019   Refusal of blood transfusions as patient is Jehovah's Witness     Tobacco Use: Social History   Tobacco Use  Smoking Status Never  Smokeless Tobacco Never    Labs: Review Flowsheet  More data exists  Latest Ref Rng & Units 04/08/2010 12/25/2019 08/29/2022 12/29/2023 12/31/2023  Labs for ITP Cardiac and Pulmonary Rehab  Cholestrol 0 - 200 mg/dL - 854  - - 882   LDL (calc) 0 - 99 mg/dL - 71  - - 52   HDL-C >59 mg/dL - 61  - - 53   Trlycerides <150 mg/dL - 62  - - 61   Hemoglobin A1c 4.8 - 5.6 % - - - 7.5  -  TCO2 22 - 32 mmol/L 25  - 27  22  -    Capillary Blood Glucose: Lab Results  Component Value Date   GLUCAP 91 03/26/2024   GLUCAP 120 (H) 03/14/2024   GLUCAP 157 (H) 03/14/2024   GLUCAP 117 (H) 03/12/2024   GLUCAP 113 (H) 03/12/2024     Exercise Target Goals: Exercise Program Goal: Individual  exercise prescription set using results from initial 6 min walk test and THRR while considering  patient's activity barriers and safety.   Exercise Prescription Goal: Initial exercise prescription builds to 30-45 minutes a day of aerobic activity, 2-3 days per week.  Home exercise guidelines will be given to patient during program as part of exercise prescription that the participant will acknowledge.  Activity Barriers & Risk Stratification:  Activity Barriers & Cardiac Risk Stratification - 03/06/24 1340       Activity Barriers & Cardiac Risk Stratification   Activity Barriers Back Problems;Deconditioning;Balance Concerns    Cardiac Risk Stratification High          6 Minute Walk:  6 Minute Walk     Row Name 03/06/24 1534         6 Minute Walk   Phase Initial     Distance 1308 feet     Walk Time 6 minutes     # of Rest Breaks 0     MPH 2.5     METS 2.6     RPE 11     Perceived Dyspnea  1     VO2 Peak 9.03     Symptoms Yes (comment)     Comments Mild SOB, RPD =1; low back pain 7/10, rt hip pain 4/10     Resting HR 64 bpm     Resting BP 130/78     Resting Oxygen Saturation  95 %     Exercise Oxygen Saturation  during 6 min walk 98 %     Max Ex. HR 95 bpm     Max Ex. BP 134/80        Oxygen Initial Assessment:   Oxygen Re-Evaluation:   Oxygen Discharge (Final Oxygen Re-Evaluation):   Initial Exercise Prescription:  Initial Exercise Prescription - 03/06/24 1500       Date of Initial Exercise RX and Referring Provider   Date 03/06/24    Referring Provider Wilbert Bihari, MD    Expected Discharge Date 05/30/24      NuStep   Level 2    SPM 75    Minutes 30    METs 2.6      Prescription Details   Frequency (times per week) 3    Duration Progress to 30 minutes of continuous aerobic without signs/symptoms of physical distress      Intensity   THRR 40-80% of Max Heartrate 60-120    Ratings of Perceived Exertion 11-13    Perceived Dyspnea 0-4       Progression   Progression Continue progressive overload as per policy without signs/symptoms or physical distress.  Resistance Training   Training Prescription Yes    Weight 3lbs    Reps 10-15          Perform Capillary Blood Glucose checks as needed.  Exercise Prescription Changes:   Exercise Prescription Changes     Row Name 03/12/24 1637 04/18/24 0948           Response to Exercise   Blood Pressure (Admit) 126/68 118/66      Blood Pressure (Exercise) 142/86 --      Blood Pressure (Exit) 122/76 124/84      Heart Rate (Admit) 77 bpm 74 bpm      Heart Rate (Exercise) 101 bpm 113 bpm      Heart Rate (Exit) 85 bpm 83 bpm      Rating of Perceived Exertion (Exercise) 11 10      Perceived Dyspnea (Exercise) 0 0      Symptoms 0 0      Comments Pt first day in the [facility] program Reviewed MET's and goals      Duration Progress to 30 minutes of  aerobic without signs/symptoms of physical distress Progress to 30 minutes of  aerobic without signs/symptoms of physical distress      Intensity THRR unchanged THRR unchanged        Progression   Progression Continue to progress workloads to maintain intensity without signs/symptoms of physical distress. Continue to progress workloads to maintain intensity without signs/symptoms of physical distress.      Average METs 2.2 3.06        Resistance Training   Training Prescription Yes Yes      Weight 3lbs 3lbs      Reps 10-15 10-15      Time 1010 Minutes 10 Minutes        NuStep   Level 2 3      SPM 87 113      Minutes 30 15      METs 2.2 3.2        Track   Laps -- 15      Minutes -- 15      METs -- 3.2         Exercise Comments:   Exercise Comments     Row Name 03/12/24 1643 04/16/24 1630         Exercise Comments Pt first day in the [facility] program. Pt tolerated exercise well with an average MET level of 2.2. She is off to a good start and is learning her THRR, RPE and ExRx Reviewed MET's and goals. Pt  tolerated exercise well with an average MET level of 3.06. Pt is feeling good and progressing MET's and workloads. She is walking the track, this was a goal to be able to walk more and she is doing very well. She already feels an increase in strength and stamina         Exercise Goals and Review:   Exercise Goals     Row Name 03/06/24 1339             Exercise Goals   Increase Physical Activity Yes       Intervention Provide advice, education, support and counseling about physical activity/exercise needs.;Develop an individualized exercise prescription for aerobic and resistive training based on initial evaluation findings, risk stratification, comorbidities and participant's personal goals.       Expected Outcomes Short Term: Attend rehab on a regular basis to increase amount of physical activity.;Long Term: Add in home exercise to  make exercise part of routine and to increase amount of physical activity.;Long Term: Exercising regularly at least 3-5 days a week.       Increase Strength and Stamina Yes       Intervention Provide advice, education, support and counseling about physical activity/exercise needs.;Develop an individualized exercise prescription for aerobic and resistive training based on initial evaluation findings, risk stratification, comorbidities and participant's personal goals.       Expected Outcomes Short Term: Increase workloads from initial exercise prescription for resistance, speed, and METs.;Short Term: Perform resistance training exercises routinely during rehab and add in resistance training at home;Long Term: Improve cardiorespiratory fitness, muscular endurance and strength as measured by increased METs and functional capacity ( )       Able to understand and use rate of perceived exertion (RPE) scale Yes       Intervention Provide education and explanation on how to use RPE scale       Expected Outcomes Short Term: Able to use RPE daily in rehab to express  subjective intensity level;Long Term:  Able to use RPE to guide intensity level when exercising independently       Knowledge and understanding of Target Heart Rate Range (THRR) Yes       Intervention Provide education and explanation of THRR including how the numbers were predicted and where they are located for reference       Expected Outcomes Short Term: Able to state/look up THRR;Short Term: Able to use daily as guideline for intensity in rehab;Long Term: Able to use THRR to govern intensity when exercising independently       Understanding of Exercise Prescription Yes       Intervention Provide education, explanation, and written materials on patient's individual exercise prescription       Expected Outcomes Short Term: Able to explain program exercise prescription;Long Term: Able to explain home exercise prescription to exercise independently          Exercise Goals Re-Evaluation :  Exercise Goals Re-Evaluation     Row Name 03/12/24 1641 04/16/24 1630           Exercise Goal Re-Evaluation   Exercise Goals Review Increase Physical Activity;Understanding of Exercise Prescription;Knowledge and understanding of Target Heart Rate Range (THRR);Increase Strength and Stamina;Able to understand and use rate of perceived exertion (RPE) scale Increase Physical Activity;Understanding of Exercise Prescription;Knowledge and understanding of Target Heart Rate Range (THRR);Increase Strength and Stamina;Able to understand and use rate of perceived exertion (RPE) scale      Comments Pt first day in the [facility] program. Pt tolerated exercise well with an average MET level of 2.2. She is off to a good start and is learning her THRR, RPE and ExRx Reviewed MET's and goals. Pt tolerated exercise well with an average MET level of 3.06. Pt is feeling good and progressing MET's and workloads. She is walking the track, this was a goal to be able to walk more and she is doing very well. She already feels an  increase in strength and stamina      Expected Outcomes Will continue to monitor pt and progress workloads as tolerated without sign or symptom Will continue to monitor pt and progress workloads as tolerated without sign or symptom         Discharge Exercise Prescription (Final Exercise Prescription Changes):  Exercise Prescription Changes - 04/18/24 0948       Response to Exercise   Blood Pressure (Admit) 118/66    Blood Pressure (Exit)  124/84    Heart Rate (Admit) 74 bpm    Heart Rate (Exercise) 113 bpm    Heart Rate (Exit) 83 bpm    Rating of Perceived Exertion (Exercise) 10    Perceived Dyspnea (Exercise) 0    Symptoms 0    Comments Reviewed MET's and goals    Duration Progress to 30 minutes of  aerobic without signs/symptoms of physical distress    Intensity THRR unchanged      Progression   Progression Continue to progress workloads to maintain intensity without signs/symptoms of physical distress.    Average METs 3.06      Resistance Training   Training Prescription Yes    Weight 3lbs    Reps 10-15    Time 10 Minutes      NuStep   Level 3    SPM 113    Minutes 15    METs 3.2      Track   Laps 15    Minutes 15    METs 3.2          Nutrition:  Target Goals: Understanding of nutrition guidelines, daily intake of sodium 1500mg , cholesterol 200mg , calories 30% from fat and 7% or less from saturated fats, daily to have 5 or more servings of fruits and vegetables.  Biometrics:  Pre Biometrics - 03/06/24 1400       Pre Biometrics   Waist Circumference 39.5 inches    Hip Circumference 47 inches    Waist to Hip Ratio 0.84 %    Triceps Skinfold 24 mm    % Body Fat 42.4 %    Grip Strength 12 kg    Flexibility --   Not performed, active chronic low back pain 4/10   Single Leg Stand 2 seconds           Nutrition Therapy Plan and Nutrition Goals:  Nutrition Therapy & Goals - 04/16/24 1055       Nutrition Therapy   Diet Heart Healthy Diet     Drug/Food Interactions Statins/Certain Fruits      Personal Nutrition Goals   Nutrition Goal Patient to identify strategies for reducing cardiovascular risk by attending the [facility] education and nutrition series weekly.   goal in progress.   Personal Goal #2 Patient to improve diet quality by using the plate method as a guide for meal planning to include lean protein/plant protein, fruits, vegetables, whole grains, nonfat dairy as part of a well-balanced diet.   goal in progress.   Comments Goals in progress. 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 She continues to attend the [facility] education and nutrition series regularly. Lipids are well controlled at goal (treated with metformin ). A1c is not at goal. She is down 1.1# since starting with our program.  Patient will benefit from participation in intensive cardiac rehab for nutrition education, exercise, and lifestyle modification.      Intervention Plan   Intervention Prescribe, educate and counsel regarding individualized specific dietary modifications aiming towards targeted core components such as weight, hypertension, lipid management, diabetes, heart failure and other comorbidities.;Nutrition handout(s) given to patient.    Expected Outcomes Short Term Goal: Understand basic principles of dietary content, such as calories, fat, sodium, cholesterol and nutrients.;Long Term Goal: Adherence to prescribed nutrition plan.          Nutrition Assessments:  Nutrition Assessments - 04/19/24 1009       Rate Your Plate Scores  Pre Score 56         MEDIFICTS Score Key: >=70 Need to make dietary changes  40-70 Heart Healthy Diet <= 40 Therapeutic Level Cholesterol Diet   Flowsheet Row INTENSIVE CARDIAC REHAB from 04/18/2024 in [facility]  Picture Your Plate Total Score on Admission 56    Picture Your Plate Scores: <59 Unhealthy dietary pattern with much room for improvement. 41-50 Dietary pattern unlikely to meet recommendations for good health and room for improvement. 51-60 More healthful dietary pattern, with some room for improvement.  >60 Healthy dietary pattern, although there may be some specific behaviors that could be improved.    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 Lipids are well controlled at goal. A1c is not at goal. Patient will benefit from participation in intensive cardiac rehab for nutrition education, exercise, and lifestyle modification. Goals in progress. 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 She continues to attend the [facility] education and nutrition series regularly. Lipids are well controlled at goal (treated with metformin ). A1c is not at goal. She is down 1.1# since starting with our program. Patient will benefit from participation in intensive cardiac rehab for nutrition education, exercise, and lifestyle modification.         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 Lipids are well controlled at goal. A1c is not at goal. Patient will  benefit from participation in intensive cardiac rehab for nutrition education, exercise, and lifestyle modification. Goals in progress. 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 She continues to attend the [facility] education and nutrition series regularly. Lipids are well controlled at goal (treated with metformin ). A1c is not at goal. She is down 1.1# since starting with our program. Patient will benefit from participation in intensive cardiac rehab for nutrition education, exercise, and lifestyle modification.         Nutrition Goals Discharge (Final Nutrition Goals Re-Evaluation):  Nutrition Goals Re-Evaluation - 04/16/24 1055       Goals   Current Weight 179 lb 10.8 oz (81.5 kg)    Comment LDL 52, Lpa 108, A1c 7.5    Expected Outcome Goals in progress. 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 She continues to attend the [facility] education and nutrition series regularly. Lipids are well controlled at goal (treated with metformin ). A1c  is not at goal. She is down 1.1# since starting with our program. Patient will benefit from participation in intensive cardiac rehab for nutrition education, exercise, and lifestyle modification.          Psychosocial: Target Goals: Acknowledge presence or absence of significant depression and/or stress, maximize coping skills, provide positive support system. Participant is able to verbalize types and ability to use techniques and skills needed for reducing stress and depression.  Initial Review & Psychosocial Screening:  Initial Psych Review & Screening - 03/06/24 1449       Initial Review   Current issues with Current Depression;Current Psychotropic Meds;History of Depression;Current Stress Concerns    Source of Stress Concerns Chronic Illness;Unable to participate in former  interests or hobbies    Comments Pt attributes some of this to her CA diagnosis/treatment      Family Dynamics   Good Support System? Yes   Pt has brothers and sisters and her church community     Barriers   Psychosocial barriers to participate in program The patient should benefit from training in stress management and relaxation.      Screening Interventions   Interventions Encouraged to exercise    Expected Outcomes Short Term goal: Utilizing psychosocial counselor, staff and physician to assist with identification of specific Stressors or current issues interfering with healing process. Setting desired goal for each stressor or current issue identified.;Long Term Goal: Stressors or current issues are controlled or eliminated.;Short Term goal: Identification and review with participant of any Quality of Life or Depression concerns found by scoring the questionnaire.;Long Term goal: The participant improves quality of Life and PHQ9 Scores as seen by post scores and/or verbalization of changes          Quality of Life Scores:  Quality of Life - 03/06/24 1445       Quality of Life   Select Quality of Life      Quality of Life Scores   Health/Function Pre 19.19 %    Socioeconomic Pre 28 %    Psych/Spiritual Pre 30 %    Family Pre 27 %    GLOBAL Pre 24.02 %         Scores of 19 and below usually indicate a poorer quality of life in these areas.  A difference of  2-3 points is a clinically meaningful difference.  A difference of 2-3 points in the total score of the Quality of Life Index has been associated with significant improvement in overall quality of life, self-image, physical symptoms, and general health in studies assessing change in quality of life.  PHQ-9: Review Flowsheet       03/06/2024  Depression screen PHQ 2/9  Decreased Interest 2  Down, Depressed, Hopeless 2  PHQ - 2 Score 4  Altered sleeping 0  Tired, decreased energy 1  Change in appetite 2  Feeling bad  or failure about yourself  0  Trouble concentrating 0  Moving slowly or fidgety/restless 1  Suicidal thoughts 0  PHQ-9 Score 8  Difficult doing work/chores Somewhat difficult   Interpretation of Total Score  Total Score Depression Severity:  1-4 = Minimal depression, 5-9 = Mild depression, 10-14 = Moderate depression, 15-19 = Moderately severe depression, 20-27 = Severe depression   Psychosocial Evaluation and Intervention:   Psychosocial Re-Evaluation:  Psychosocial Re-Evaluation     Row Name 03/12/24 1737 03/27/24 1520 04/23/24 1806         Psychosocial Re-Evaluation   Current issues with Current Depression;History of  Depression;Current Psychotropic Meds;Current Stress Concerns Current Depression;History of Depression;Current Psychotropic Meds;Current Stress Concerns Current Depression;History of Depression;Current Psychotropic Meds;Current Stress Concerns     Comments Lakeysha says that she has had less energy  and has experienced some depression since her cancer and newly  diagnosed cardiac diagnosis. Paytyn says the antidepressant is controlling her depression. Kamira has good family support and is not interested in counseling at this time. Gustavia says the antidepressant is controlling her depression. Larrisa has not voiced any increased concerns or stressors during exercise at cardiac rehab. Latrease has not voiced any increased concerns or stressors during exercise at cardiac rehab.     Expected Outcomes Zabdi will have controlled or decreased depression/ stressor supon completion of cardiac rehab. Arneda is going through chemotherapy and radiation and will attend cardiac rehab as she can tolerate. Dorethy will have controlled or decreased depression/ stressor supon completion of cardiac rehab. Roshell is going through chemotherapy and radiation and will attend cardiac rehab as she can tolerate. Brocha will have controlled or decreased depression/ stressor  supon completion of cardiac rehab. Madell is going through chemotherapy and radiation and will attend cardiac rehab as she can tolerate.     Interventions Stress management education;Relaxation education;Encouraged to attend Cardiac Rehabilitation for the exercise Stress management education;Relaxation education;Encouraged to attend Cardiac Rehabilitation for the exercise Stress management education;Relaxation education;Encouraged to attend Cardiac Rehabilitation for the exercise     Continue Psychosocial Services  Follow up required by staff Follow up required by staff Follow up required by staff       Initial Review   Source of Stress Concerns Chronic Illness;Unable to participate in former interests or hobbies;Unable to perform yard/household activities Chronic Illness;Unable to participate in former interests or hobbies;Unable to perform yard/household activities Chronic Illness;Unable to participate in former interests or hobbies;Unable to perform yard/household activities     Comments Will continue to montior and offer support as needed. Will continue to montior and offer support as needed. Will continue to montior and offer support as needed.        Psychosocial Discharge (Final Psychosocial Re-Evaluation):  Psychosocial Re-Evaluation - 04/23/24 1806       Psychosocial Re-Evaluation   Current issues with Current Depression;History of Depression;Current Psychotropic Meds;Current Stress Concerns    Comments Deira has not voiced any increased concerns or stressors during exercise at cardiac rehab.    Expected Outcomes Elica will have controlled or decreased depression/ stressor supon completion of cardiac rehab. Gwenna is going through chemotherapy and radiation and will attend cardiac rehab as she can tolerate.    Interventions Stress management education;Relaxation education;Encouraged to attend Cardiac Rehabilitation for the exercise    Continue Psychosocial Services  Follow  up required by staff      Initial Review   Source of Stress Concerns Chronic Illness;Unable to participate in former interests or hobbies;Unable to perform yard/household activities    Comments Will continue to montior and offer support as needed.          Vocational Rehabilitation: Provide vocational rehab assistance to qualifying candidates.   Vocational Rehab Evaluation & Intervention:  Vocational Rehab - 03/06/24 1341       Initial Vocational Rehab Evaluation & Intervention   Assessment shows need for Vocational Rehabilitation No   Retired         Education: Education Goals: Education classes will be provided on a weekly basis, covering required topics. Participant will state understanding/return demonstration of topics presented.    Education     [employer]  Name 03/12/24 1400     Education   Cardiac Education Topics Pritikin   Glass blower/designer Nutrition   Nutrition Workshop Fueling a Forensic psychologist   Instruction Review Code 1- Teaching laboratory technician Start Time 1400   Class Stop Time 1445   Class Time Calculation (min) 45 min    Row Name 03/14/24 1400     Education   Cardiac Education Topics Pritikin   Customer service manager   Weekly Topic International Cuisine- Spotlight on the [employer] Zones   Instruction Review Code 1- Verbalizes Understanding   Class Start Time 1355   Class Stop Time 1435   Class Time Calculation (min) 40 min    Row Name 03/19/24 1500     Education   Cardiac Education Topics Pritikin   Geographical information systems officer Psychosocial   Psychosocial Workshop Healthy Sleep for a Healthy Heart   Instruction Review Code 1- Verbalizes Understanding   Class Start Time 1400   Class Stop Time 1443   Class Time Calculation (min) 43 min    Row Name 03/26/24 1400     Education   Cardiac Education Topics Pritikin    Hospital doctor Education   General Education Hypertension and Heart Disease   Instruction Review Code 1- Verbalizes Understanding   Class Start Time 1403   Class Stop Time 1445   Class Time Calculation (min) 42 min    Row Name 03/28/24 1400     Education   Cardiac Education Topics Pritikin   Customer service manager   Weekly Topic Powerhouse Plant-Based Proteins   Instruction Review Code 1- Verbalizes Understanding   Class Start Time 1400   Class Stop Time 1440   Class Time Calculation (min) 40 min    Row Name 04/02/24 1400     Education   Cardiac Education Topics Pritikin   Geographical information systems officer Psychosocial   Psychosocial Workshop From Head to Heart: The Power of a Healthy Outlook   Instruction Review Code 1- Verbalizes Understanding   Class Start Time 1400   Class Stop Time 1445   Class Time Calculation (min) 45 min    Row Name 04/04/24 1400     Education   Cardiac Education Topics Pritikin   Customer service manager   Weekly Topic Tasty Appetizers and Snacks   Instruction Review Code 1- Verbalizes Understanding   Class Start Time 1400   Class Stop Time 1440   Class Time Calculation (min) 40 min    Row Name 04/06/24 1300     Education   Cardiac Education Topics Pritikin   Hospital doctor Education   General Education Heart Disease Risk Reduction   Instruction Review Code 1- Verbalizes Understanding   Class Start Time 1403   Class Stop Time 1437   Class Time Calculation (min) 34 min    Row Name 04/09/24 1600     Education   Cardiac Education Topics Pritikin   Select  Core Videos     Core Videos   Educator Exercise Physiologist   Select Psychosocial   Psychosocial Healthy  Minds, Bodies, Hearts   Instruction Review Code 1- Verbalizes Understanding   Class Start Time 1415   Class Stop Time 1450   Class Time Calculation (min) 35 min    Row Name 04/11/24 1400     Education   Cardiac Education Topics Pritikin   Customer service manager   Weekly Topic Adding Flavor - Sodium-Free   Instruction Review Code 1- Verbalizes Understanding   Class Start Time 1345   Class Stop Time 1435   Class Time Calculation (min) 50 min    Row Name 04/13/24 1400     Education   Cardiac Education Topics Pritikin     Workshops   Educator Exercise Physiologist   Select Exercise   Exercise Workshop Location manager and Fall Prevention   Instruction Review Code 1- Verbalizes Understanding   Class Start Time 1402   Class Stop Time 1441   Class Time Calculation (min) 39 min    Row Name 04/16/24 1500     Education   Cardiac Education Topics Pritikin   Glass blower/designer Nutrition   Nutrition Workshop Label Reading   Instruction Review Code 1- Tax inspector   Class Start Time 1400   Class Stop Time 1450   Class Time Calculation (min) 50 min    Row Name 04/18/24 1500     Education   Cardiac Education Topics Pritikin   Customer service manager   Weekly Topic Fast and Healthy Breakfasts   Instruction Review Code 1- Verbalizes Understanding   Class Start Time 1400   Class Stop Time 1440   Class Time Calculation (min) 40 min    Row Name 04/20/24 1400     Education   Cardiac Education Topics Pritikin   Nurse, children's   Educator Nurse   Select Nutrition   Nutrition Other   Instruction Review Code 1- Verbalizes Understanding   Class Start Time 1400   Class Stop Time 1445   Class Time Calculation (min) 45 min      Core Videos: Exercise    Move It!  Clinical staff conducted group or individual video  education with verbal and written material and guidebook.  Patient learns the recommended Pritikin exercise program. Exercise with the goal of living a long, healthy life. Some of the health benefits of exercise include controlled diabetes, healthier blood pressure levels, improved cholesterol levels, improved heart and lung capacity, improved sleep, and better body composition. Everyone should speak with their doctor before starting or changing an exercise routine.  Biomechanical Limitations Clinical staff conducted group or individual video education with verbal and written material and guidebook.  Patient learns how biomechanical limitations can impact exercise and how we can mitigate and possibly overcome limitations to have an impactful and balanced exercise routine.  Body Composition Clinical staff conducted group or individual video education with verbal and written material and guidebook.  Patient learns that body composition (ratio of muscle mass to fat mass) is a key component to assessing overall fitness, rather than body weight alone. Increased fat mass, especially visceral belly fat, can put us  at increased risk for metabolic syndrome, type 2 diabetes, heart disease, and even death. It  is recommended to combine diet and exercise (cardiovascular and resistance training) to improve your body composition. Seek guidance from your physician and exercise physiologist before implementing an exercise routine.  Exercise Action Plan Clinical staff conducted group or individual video education with verbal and written material and guidebook.  Patient learns the recommended strategies to achieve and enjoy long-term exercise adherence, including variety, self-motivation, self-efficacy, and positive decision making. Benefits of exercise include fitness, good health, weight management, more energy, better sleep, less stress, and overall well-being.  Medical   Heart Disease Risk Reduction Clinical staff  conducted group or individual video education with verbal and written material and guidebook.  Patient learns our heart is our most vital organ as it circulates oxygen, nutrients, white blood cells, and hormones throughout the entire body, and carries waste away. Data supports a plant-based eating plan like the Pritikin Program for its effectiveness in slowing progression of and reversing heart disease. The video provides a number of recommendations to address heart disease.   Metabolic Syndrome and Belly Fat  Clinical staff conducted group or individual video education with verbal and written material and guidebook.  Patient learns what metabolic syndrome is, how it leads to heart disease, and how one can reverse it and keep it from coming back. You have metabolic syndrome if you have 3 of the following 5 criteria: abdominal obesity, high blood pressure, high triglycerides, low HDL cholesterol, and high blood sugar.  Hypertension and Heart Disease Clinical staff conducted group or individual video education with verbal and written material and guidebook.  Patient learns that high blood pressure, or hypertension, is very common in the [place] . Hypertension is largely due to excessive salt intake, but other important risk factors include being overweight, physical inactivity, drinking too much alcohol, smoking, and not eating enough potassium from fruits and vegetables. High blood pressure is a leading risk factor for heart attack, stroke, congestive heart failure, dementia, kidney failure, and premature death. Long-term effects of excessive salt intake include stiffening of the arteries and thickening of heart muscle and organ damage. Recommendations include ways to reduce hypertension and the risk of heart disease.  Diseases of Our Time - Focusing on Diabetes Clinical staff conducted group or individual video education with verbal and written material and guidebook.  Patient learns why the best  way to stop diseases of our time is prevention, through food and other lifestyle changes. Medicine (such as prescription pills and surgeries) is often only a Band-Aid on the problem, not a long-term solution. Most common diseases of our time include obesity, type 2 diabetes, hypertension, heart disease, and cancer. The Pritikin Program is recommended and has been proven to help reduce, reverse, and/or prevent the damaging effects of metabolic syndrome.  Nutrition   Overview of the Pritikin Eating Plan  Clinical staff conducted group or individual video education with verbal and written material and guidebook.  Patient learns about the Pritikin Eating Plan for disease risk reduction. The Pritikin Eating Plan emphasizes a wide variety of unrefined, minimally-processed carbohydrates, like fruits, vegetables, whole grains, and legumes. Go, Caution, and Stop food choices are explained. Plant-based and lean animal proteins are emphasized. Rationale provided for low sodium intake for blood pressure control, low added sugars for blood sugar stabilization, and low added fats and oils for coronary artery disease risk reduction and weight management.  Calorie Density  Clinical staff conducted group or individual video education with verbal and written material and guidebook.  Patient learns about calorie density and how it  impacts the Pritikin Eating Plan. Knowing the characteristics of the food you choose will help you decide whether those foods will lead to weight gain or weight loss, and whether you want to consume more or less of them. Weight loss is usually a side effect of the Pritikin Eating Plan because of its focus on low calorie-dense foods.  Label Reading  Clinical staff conducted group or individual video education with verbal and written material and guidebook.  Patient learns about the Pritikin recommended label reading guidelines and corresponding recommendations regarding calorie density, added  sugars, sodium content, and whole grains.  Dining Out - Part 1  Clinical staff conducted group or individual video education with verbal and written material and guidebook.  Patient learns that restaurant meals can be sabotaging because they can be so high in calories, fat, sodium, and/or sugar. Patient learns recommended strategies on how to positively address this and avoid unhealthy pitfalls.  Facts on Fats  Clinical staff conducted group or individual video education with verbal and written material and guidebook.  Patient learns that lifestyle modifications can be just as effective, if not more so, as many medications for lowering your risk of heart disease. A Pritikin lifestyle can help to reduce your risk of inflammation and atherosclerosis (cholesterol build-up, or plaque, in the artery walls). Lifestyle interventions such as dietary choices and physical activity address the cause of atherosclerosis. A review of the types of fats and their impact on blood cholesterol levels, along with dietary recommendations to reduce fat intake is also included.  Nutrition Action Plan  Clinical staff conducted group or individual video education with verbal and written material and guidebook.  Patient learns how to incorporate Pritikin recommendations into their lifestyle. Recommendations include planning and keeping personal health goals in mind as an important part of their success.  Healthy Mind-Set    Healthy Minds, Bodies, Hearts  Clinical staff conducted group or individual video education with verbal and written material and guidebook.  Patient learns how to identify when they are stressed. Video will discuss the impact of that stress, as well as the many benefits of stress management. Patient will also be introduced to stress management techniques. The way we think, act, and feel has an impact on our hearts.  How Our Thoughts Can Heal Our Hearts  Clinical staff conducted group or individual  video education with verbal and written material and guidebook.  Patient learns that negative thoughts can cause depression and anxiety. This can result in negative lifestyle behavior and serious health problems. Cognitive behavioral therapy is an effective method to help control our thoughts in order to change and improve our emotional outlook.  Additional Videos:  Exercise    Improving Performance  Clinical staff conducted group or individual video education with verbal and written material and guidebook.  Patient learns to use a non-linear approach by alternating intensity levels and lengths of time spent exercising to help burn more calories and lose more body fat. Cardiovascular exercise helps improve heart health, metabolism, hormonal balance, blood sugar control, and recovery from fatigue. Resistance training improves strength, endurance, balance, coordination, reaction time, metabolism, and muscle mass. Flexibility exercise improves circulation, posture, and balance. Seek guidance from your physician and exercise physiologist before implementing an exercise routine and learn your capabilities and proper form for all exercise.  Introduction to Yoga  Clinical staff conducted group or individual video education with verbal and written material and guidebook.  Patient learns about yoga, a discipline of the coming together of  mind, breath, and body. The benefits of yoga include improved flexibility, improved range of motion, better posture and core strength, increased lung function, weight loss, and positive self-image. Yoga's heart health benefits include lowered blood pressure, healthier heart rate, decreased cholesterol and triglyceride levels, improved immune function, and reduced stress. Seek guidance from your physician and exercise physiologist before implementing an exercise routine and learn your capabilities and proper form for all exercise.  Medical   Aging: Enhancing Your Quality of  Life  Clinical staff conducted group or individual video education with verbal and written material and guidebook.  Patient learns key strategies and recommendations to stay in good physical health and enhance quality of life, such as prevention strategies, having an advocate, securing a Health Care Proxy and Power of Attorney, and keeping a list of medications and system for tracking them. It also discusses how to avoid risk for bone loss.  Biology of Weight Control  Clinical staff conducted group or individual video education with verbal and written material and guidebook.  Patient learns that weight gain occurs because we consume more calories than we burn (eating more, moving less). Even if your body weight is normal, you may have higher ratios of fat compared to muscle mass. Too much body fat puts you at increased risk for cardiovascular disease, heart attack, stroke, type 2 diabetes, and obesity-related cancers. In addition to exercise, following the Pritikin Eating Plan can help reduce your risk.  Decoding Lab Results  Clinical staff conducted group or individual video education with verbal and written material and guidebook.  Patient learns that lab test reflects one measurement whose values change over time and are influenced by many factors, including medication, stress, sleep, exercise, food, hydration, pre-existing medical conditions, and more. It is recommended to use the knowledge from this video to become more involved with your lab results and evaluate your numbers to speak with your doctor.   Diseases of Our Time - Overview  Clinical staff conducted group or individual video education with verbal and written material and guidebook.  Patient learns that according to the CDC, 50% to 70% of chronic diseases (such as obesity, type 2 diabetes, elevated lipids, hypertension, and heart disease) are avoidable through lifestyle improvements including healthier food choices, listening to  satiety cues, and increased physical activity.  Sleep Disorders Clinical staff conducted group or individual video education with verbal and written material and guidebook.  Patient learns how good quality and duration of sleep are important to overall health and well-being. Patient also learns about sleep disorders and how they impact health along with recommendations to address them, including discussing with a physician.  Nutrition  Dining Out - Part 2 Clinical staff conducted group or individual video education with verbal and written material and guidebook.  Patient learns how to plan ahead and communicate in order to maximize their dining experience in a healthy and nutritious manner. Included are recommended food choices based on the type of restaurant the patient is visiting.   Fueling a Banker conducted group or individual video education with verbal and written material and guidebook.  There is a strong connection between our food choices and our health. Diseases like obesity and type 2 diabetes are very prevalent and are in large-part due to lifestyle choices. The Pritikin Eating Plan provides plenty of food and hunger-curbing satisfaction. It is easy to follow, affordable, and helps reduce health risks.  Menu Workshop  Clinical staff conducted group or individual video education with  verbal and written material and guidebook.  Patient learns that restaurant meals can sabotage health goals because they are often packed with calories, fat, sodium, and sugar. Recommendations include strategies to plan ahead and to communicate with the manager, chef, or server to help order a healthier meal.  Planning Your Eating Strategy  Clinical staff conducted group or individual video education with verbal and written material and guidebook.  Patient learns about the Pritikin Eating Plan and its benefit of reducing the risk of disease. The Pritikin Eating Plan does not focus on  calories. Instead, it emphasizes high-quality, nutrient-rich foods. By knowing the characteristics of the foods, we choose, we can determine their calorie density and make informed decisions.  Targeting Your Nutrition Priorities  Clinical staff conducted group or individual video education with verbal and written material and guidebook.  Patient learns that lifestyle habits have a tremendous impact on disease risk and progression. This video provides eating and physical activity recommendations based on your personal health goals, such as reducing LDL cholesterol, losing weight, preventing or controlling type 2 diabetes, and reducing high blood pressure.  Vitamins and Minerals  Clinical staff conducted group or individual video education with verbal and written material and guidebook.  Patient learns different ways to obtain key vitamins and minerals, including through a recommended healthy diet. It is important to discuss all supplements you take with your doctor.   Healthy Mind-Set    Smoking Cessation  Clinical staff conducted group or individual video education with verbal and written material and guidebook.  Patient learns that cigarette smoking and tobacco addiction pose a serious health risk which affects millions of people. Stopping smoking will significantly reduce the risk of heart disease, lung disease, and many forms of cancer. Recommended strategies for quitting are covered, including working with your doctor to develop a successful plan.  Culinary   Becoming a Set designer conducted group or individual video education with verbal and written material and guidebook.  Patient learns that cooking at home can be healthy, cost-effective, quick, and puts them in control. Keys to cooking healthy recipes will include looking at your recipe, assessing your equipment needs, planning ahead, making it simple, choosing cost-effective seasonal ingredients, and limiting the use of  added fats, salts, and sugars.  Cooking - Breakfast and Snacks  Clinical staff conducted group or individual video education with verbal and written material and guidebook.  Patient learns how important breakfast is to satiety and nutrition through the entire day. Recommendations include key foods to eat during breakfast to help stabilize blood sugar levels and to prevent overeating at meals later in the day. Planning ahead is also a key component.  Cooking - Educational psychologist conducted group or individual video education with verbal and written material and guidebook.  Patient learns eating strategies to improve overall health, including an approach to cook more at home. Recommendations include thinking of animal protein as a side on your plate rather than center stage and focusing instead on lower calorie dense options like vegetables, fruits, whole grains, and plant-based proteins, such as beans. Making sauces in large quantities to freeze for later and leaving the skin on your vegetables are also recommended to maximize your experience.  Cooking - Healthy Salads and Dressing Clinical staff conducted group or individual video education with verbal and written material and guidebook.  Patient learns that vegetables, fruits, whole grains, and legumes are the foundations of the Pritikin Eating Plan. Recommendations include how to  incorporate each of these in flavorful and healthy salads, and how to create homemade salad dressings. Proper handling of ingredients is also covered. Cooking - Soups and [employer] - Soups and Desserts Clinical staff conducted group or individual video education with verbal and written material and guidebook.  Patient learns that Pritikin soups and desserts make for easy, nutritious, and delicious snacks and meal components that are low in sodium, fat, sugar, and calorie density, while high in vitamins, minerals, and filling fiber. Recommendations  include simple and healthy ideas for soups and desserts.   Overview     The Pritikin Solution Program Overview Clinical staff conducted group or individual video education with verbal and written material and guidebook.  Patient learns that the results of the Pritikin Program have been documented in more than 100 articles published in peer-reviewed journals, and the benefits include reducing risk factors for (and, in some cases, even reversing) high cholesterol, high blood pressure, type 2 diabetes, obesity, and more! An overview of the three key pillars of the Pritikin Program will be covered: eating well, doing regular exercise, and having a healthy mind-set.  WORKSHOPS  Exercise: Exercise Basics: Building Your Action Plan Clinical staff led group instruction and group discussion with PowerPoint presentation and patient guidebook. To enhance the learning environment the use of posters, models and videos may be added. At the conclusion of this workshop, patients will comprehend the difference between physical activity and exercise, as well as the benefits of incorporating both, into their routine. Patients will understand the FITT (Frequency, Intensity, Time, and Type) principle and how to use it to build an exercise action plan. In addition, safety concerns and other considerations for exercise and cardiac rehab will be addressed by the presenter. The purpose of this lesson is to promote a comprehensive and effective weekly exercise routine in order to improve patients' overall level of fitness.   Managing Heart Disease: Your Path to a Healthier Heart Clinical staff led group instruction and group discussion with PowerPoint presentation and patient guidebook. To enhance the learning environment the use of posters, models and videos may be added.At the conclusion of this workshop, patients will understand the anatomy and physiology of the heart. Additionally, they will understand how  Pritikin's three pillars impact the risk factors, the progression, and the management of heart disease.  The purpose of this lesson is to provide a high-level overview of the heart, heart disease, and how the Pritikin lifestyle positively impacts risk factors.  Exercise Biomechanics Clinical staff led group instruction and group discussion with PowerPoint presentation and patient guidebook. To enhance the learning environment the use of posters, models and videos may be added. Patients will learn how the structural parts of their bodies function and how these functions impact their daily activities, movement, and exercise. Patients will learn how to promote a neutral spine, learn how to manage pain, and identify ways to improve their physical movement in order to promote healthy living. The purpose of this lesson is to expose patients to common physical limitations that impact physical activity. Participants will learn practical ways to adapt and manage aches and pains, and to minimize their effect on regular exercise. Patients will learn how to maintain good posture while sitting, walking, and lifting.  Balance Training and Fall Prevention  Clinical staff led group instruction and group discussion with PowerPoint presentation and patient guidebook. To enhance the learning environment the use of posters, models and videos may be added. At the conclusion of this workshop,  patients will understand the importance of their sensorimotor skills (vision, proprioception, and the vestibular system) in maintaining their ability to balance as they age. Patients will apply a variety of balancing exercises that are appropriate for their current level of function. Patients will understand the common causes for poor balance, possible solutions to these problems, and ways to modify their physical environment in order to minimize their fall risk. The purpose of this lesson is to teach patients about the  importance of maintaining balance as they age and ways to minimize their risk of falling.  WORKSHOPS   Nutrition:  Fueling a Ship broker led group instruction and group discussion with PowerPoint presentation and patient guidebook. To enhance the learning environment the use of posters, models and videos may be added. Patients will review the foundational principles of the Pritikin Eating Plan and understand what constitutes a serving size in each of the food groups. Patients will also learn Pritikin-friendly foods that are better choices when away from home and review make-ahead meal and snack options. Calorie density will be reviewed and applied to three nutrition priorities: weight maintenance, weight loss, and weight gain. The purpose of this lesson is to reinforce (in a group setting) the key concepts around what patients are recommended to eat and how to apply these guidelines when away from home by planning and selecting Pritikin-friendly options. Patients will understand how calorie density may be adjusted for different weight management goals.  Mindful Eating  Clinical staff led group instruction and group discussion with PowerPoint presentation and patient guidebook. To enhance the learning environment the use of posters, models and videos may be added. Patients will briefly review the concepts of the Pritikin Eating Plan and the importance of low-calorie dense foods. The concept of mindful eating will be introduced as well as the importance of paying attention to internal hunger signals. Triggers for non-hunger eating and techniques for dealing with triggers will be explored. The purpose of this lesson is to provide patients with the opportunity to review the basic principles of the Pritikin Eating Plan, discuss the value of eating mindfully and how to measure internal cues of hunger and fullness using the Hunger Scale. Patients will also discuss reasons for non-hunger eating and  learn strategies to use for controlling emotional eating.  Targeting Your Nutrition Priorities Clinical staff led group instruction and group discussion with PowerPoint presentation and patient guidebook. To enhance the learning environment the use of posters, models and videos may be added. Patients will learn how to determine their genetic susceptibility to disease by reviewing their family history. Patients will gain insight into the importance of diet as part of an overall healthy lifestyle in mitigating the impact of genetics and other environmental insults. The purpose of this lesson is to provide patients with the opportunity to assess their personal nutrition priorities by looking at their family history, their own health history and current risk factors. Patients will also be able to discuss ways of prioritizing and modifying the Pritikin Eating Plan for their highest risk areas  Menu  Clinical staff led group instruction and group discussion with PowerPoint presentation and patient guidebook. To enhance the learning environment the use of posters, models and videos may be added. Using menus brought in from [employer], or printed from [employer], patients will apply the Pritikin dining out guidelines that were presented in the [employer] video. Patients will also be able to practice these guidelines in a variety of provided scenarios.  The purpose of this lesson is to provide patients with the opportunity to practice hands-on learning of the Pritikin Dining Out guidelines with actual menus and practice scenarios.  Label Reading Clinical staff led group instruction and group discussion with PowerPoint presentation and patient guidebook. To enhance the learning environment the use of posters, models and videos may be added. Patients will review and discuss the Pritikin label reading guidelines presented in Pritikin's Label Reading Educational series video. Using fool  labels brought in from local grocery stores and markets, patients will apply the label reading guidelines and determine if the packaged food meet the Pritikin guidelines. The purpose of this lesson is to provide patients with the opportunity to review, discuss, and practice hands-on learning of the Pritikin Label Reading guidelines with actual packaged food labels. Cooking School  Pritikin's [employer] are designed to teach patients ways to prepare quick, simple, and affordable recipes at home. The importance of nutrition's role in chronic disease risk reduction is reflected in its emphasis in the overall Pritikin program. By learning how to prepare essential core Pritikin Eating Plan recipes, patients will increase control over what they eat; be able to customize the flavor of foods without the use of added salt, sugar, or fat; and improve the quality of the food they consume. By learning a set of core recipes which are easily assembled, quickly prepared, and affordable, patients are more likely to prepare more healthy foods at home. These workshops focus on convenient breakfasts, simple entres, side dishes, and desserts which can be prepared with minimal effort and are consistent with nutrition recommendations for cardiovascular risk reduction. Cooking [employer] are taught by a Armed forces logistics/support/administrative officer (RD) who has been trained by the [employer]. The chef or RD has a clear understanding of the importance of minimizing - if not completely eliminating - added fat, sugar, and sodium in recipes. Throughout the series of Cooking School Workshop sessions, patients will learn about healthy ingredients and efficient methods of cooking to build confidence in their capability to prepare    Cooking School weekly topics:  Adding Flavor- Sodium-Free  Fast and Healthy Breakfasts  Powerhouse Plant-Based Proteins  Satisfying Salads and Dressings  Simple Sides and  Sauces  International Cuisine-Spotlight on the [employer] Zones  Delicious Desserts  Savory Soups  [employer] - Meals in a Astronomer Appetizers and Snacks  Comforting Weekend Breakfasts  One-Pot Wonders   Fast Evening Meals  Landscape architect Your Pritikin Plate  WORKSHOPS   Healthy Mindset (Psychosocial):  Focused Goals, Sustainable Changes Clinical staff led group instruction and group discussion with PowerPoint presentation and patient guidebook. To enhance the learning environment the use of posters, models and videos may be added. Patients will be able to apply effective goal setting strategies to establish at least one personal goal, and then take consistent, meaningful action toward that goal. They will learn to identify common barriers to achieving personal goals and develop strategies to overcome them. Patients will also gain an understanding of how our mind-set can impact our ability to achieve goals and the importance of cultivating a positive and growth-oriented mind-set. The purpose of this lesson is to provide patients with a deeper understanding of how to set and achieve personal goals, as well as the tools and strategies needed to overcome common obstacles which may arise along the way.  From Head to Heart: The Power of a Healthy Outlook  Clinical staff led group instruction and group  discussion with PowerPoint presentation and patient guidebook. To enhance the learning environment the use of posters, models and videos may be added. Patients will be able to recognize and describe the impact of emotions and mood on physical health. They will discover the importance of self-care and explore self-care practices which may work for them. Patients will also learn how to utilize the 4 C's to cultivate a healthier outlook and better manage stress and challenges. The purpose of this lesson is to demonstrate to patients how a healthy outlook is an essential part of  maintaining good health, especially as they continue their cardiac rehab journey.  Healthy Sleep for a Healthy Heart Clinical staff led group instruction and group discussion with PowerPoint presentation and patient guidebook. To enhance the learning environment the use of posters, models and videos may be added. At the conclusion of this workshop, patients will be able to demonstrate knowledge of the importance of sleep to overall health, well-being, and quality of life. They will understand the symptoms of, and treatments for, common sleep disorders. Patients will also be able to identify daytime and nighttime behaviors which impact sleep, and they will be able to apply these tools to help manage sleep-related challenges. The purpose of this lesson is to provide patients with a general overview of sleep and outline the importance of quality sleep. Patients will learn about a few of the most common sleep disorders. Patients will also be introduced to the concept of "sleep hygiene," and discover ways to self-manage certain sleeping problems through simple daily behavior changes. Finally, the workshop will motivate patients by clarifying the links between quality sleep and their goals of heart-healthy living.   Recognizing and Reducing Stress Clinical staff led group instruction and group discussion with PowerPoint presentation and patient guidebook. To enhance the learning environment the use of posters, models and videos may be added. At the conclusion of this workshop, patients will be able to understand the types of stress reactions, differentiate between acute and chronic stress, and recognize the impact that chronic stress has on their health. They will also be able to apply different coping mechanisms, such as reframing negative self-talk. Patients will have the opportunity to practice a variety of stress management techniques, such as deep abdominal breathing, progressive muscle relaxation, and/or  guided imagery.  The purpose of this lesson is to educate patients on the role of stress in their lives and to provide healthy techniques for coping with it.  Learning Barriers/Preferences:  Learning Barriers/Preferences - 03/06/24 1340       Learning Barriers/Preferences   Learning Barriers Sight   wears glasses   Learning Preferences Video;Written Material;Pictoral;Computer/Internet          Education Topics:  Knowledge Questionnaire Score:  Knowledge Questionnaire Score - 03/06/24 1543       Knowledge Questionnaire Score   Pre Score 19/24          Core Components/Risk Factors/Patient Goals at Admission:  Personal Goals and Risk Factors at Admission - 03/06/24 1401       Core Components/Risk Factors/Patient Goals on Admission    Weight Management Weight Maintenance    Diabetes Yes    Intervention Provide education about signs/symptoms and action to take for hypo/hyperglycemia.;Provide education about proper nutrition, including hydration, and aerobic/resistive exercise prescription along with prescribed medications to achieve blood glucose in normal ranges: Fasting glucose 65-99 mg/dL    Expected Outcomes Short Term: Participant verbalizes understanding of the signs/symptoms and immediate care of hyper/hypoglycemia, proper foot care  and importance of medication, aerobic/resistive exercise and nutrition plan for blood glucose control.;Long Term: Attainment of HbA1C < 7%.    Hypertension Yes    Intervention Provide education on lifestyle modifcations including regular physical activity/exercise, weight management, moderate sodium restriction and increased consumption of fresh fruit, vegetables, and low fat dairy, alcohol moderation, and smoking cessation.;Monitor prescription use compliance.    Expected Outcomes Short Term: Continued assessment and intervention until BP is < 140/51mm HG in hypertensive participants. < 130/73mm HG in hypertensive participants with diabetes, heart  failure or chronic kidney disease.;Long Term: Maintenance of blood pressure at goal levels.    Lipids Yes    Intervention Provide education and support for participant on nutrition & aerobic/resistive exercise along with prescribed medications to achieve LDL 70mg , HDL >40mg .    Expected Outcomes Short Term: Participant states understanding of desired cholesterol values and is compliant with medications prescribed. Participant is following exercise prescription and nutrition guidelines.;Long Term: Cholesterol controlled with medications as prescribed, with individualized exercise RX and with personalized nutrition plan. Value goals: LDL < 70mg , HDL > 40 mg.    Stress Yes    Intervention Offer individual and/or small group education and counseling on adjustment to heart disease, stress management and health-related lifestyle change. Teach and support self-help strategies.;Refer participants experiencing significant psychosocial distress to appropriate mental health specialists for further evaluation and treatment. When possible, include family members and significant others in education/counseling sessions.    Expected Outcomes Short Term: Participant demonstrates changes in health-related behavior, relaxation and other stress management skills, ability to obtain effective social support, and compliance with psychotropic medications if prescribed.;Long Term: Emotional wellbeing is indicated by absence of clinically significant psychosocial distress or social isolation.          Core Components/Risk Factors/Patient Goals Review:   Goals and Risk Factor Review     Row Name 03/12/24 1740 03/27/24 1525 04/23/24 1808         Core Components/Risk Factors/Patient Goals Review   Personal Goals Review Weight Management/Obesity;Lipids;Stress;Hypertension;Diabetes Weight Management/Obesity;Lipids;Stress;Hypertension;Diabetes Weight Management/Obesity;Lipids;Stress;Hypertension;Diabetes     Review Chailyn  started cardiac rehab on 03/12/24. Colbie did well with exercise. Vital signs and CBG's were stable. Nitzia is off to a good start  with exercise at cardiac rehab. . Vital signs and CBG's have been stable. Hina is doing well  with exercise at cardiac rehab. . Vital signs have been stable.     Expected Outcomes Bayyinah will continue to particiapate in cardiac rehab for exercise, nutrition and lifestyle modifications. Ouita will continue to particiapate in cardiac rehab for exercise, nutrition and lifestyle modifications. Hadiya will continue to particiapate in cardiac rehab for exercise, nutrition and lifestyle modifications.        Core Components/Risk Factors/Patient Goals at Discharge (Final Review):   Goals and Risk Factor Review - 04/23/24 1808       Core Components/Risk Factors/Patient Goals Review   Personal Goals Review Weight Management/Obesity;Lipids;Stress;Hypertension;Diabetes    Review Kierston is doing well  with exercise at cardiac rehab. . Vital signs have been stable.    Expected Outcomes Gayle will continue to particiapate in cardiac rehab for exercise, nutrition and lifestyle modifications.          ITP Comments:  ITP Comments     Row Name 03/06/24 1322 03/12/24 1735 03/27/24 1514 04/23/24 1805     ITP Comments Wilbert Bihari, MD: Medical Director. Introduction to the [employer] / Intensive Cardiac Rehab. Initial orienation packet reviewed with the patient. 30 Day ITP Review.  Angela started cardiac rehab on 03/12/24. Katyana did well with exercise. 30 Day ITP Review. Dacey is off to a good start with exercise at cardiac rehab 30 Day ITP Review. Dyamond has good particpation with exercise at cardiac rehab when in attendance.       Comments: See ITP comments.[name] RN BSN

## 2024-04-25 ENCOUNTER — Encounter (HOSPITAL_COMMUNITY)
Admission: RE | Admit: 2024-04-25 | Discharge: 2024-04-25 | Disposition: A | Discharge status: Home / Self Care | Source: Ambulatory Visit | Attending: Cardiology | Admitting: Cardiology

## 2024-04-25 ENCOUNTER — Ambulatory Visit (HOSPITAL_COMMUNITY)

## 2024-04-25 DIAGNOSIS — I214 Non-ST elevation (NSTEMI) myocardial infarction: Secondary | ICD-10-CM

## 2024-04-25 DIAGNOSIS — I252 Old myocardial infarction: Secondary | ICD-10-CM | POA: Diagnosis not present

## 2024-04-27 ENCOUNTER — Ambulatory Visit (HOSPITAL_COMMUNITY)

## 2024-04-27 ENCOUNTER — Encounter (HOSPITAL_COMMUNITY)
Admission: RE | Admit: 2024-04-27 | Discharge: 2024-04-27 | Disposition: A | Discharge status: Home / Self Care | Source: Ambulatory Visit | Attending: Cardiology | Admitting: Cardiology

## 2024-04-27 DIAGNOSIS — I214 Non-ST elevation (NSTEMI) myocardial infarction: Secondary | ICD-10-CM

## 2024-04-29 NOTE — Progress Notes (Signed)
 " Cardiology Office Note    Patient Name: Tanya Walsh Date of Encounter: 04/29/2024  Primary Care Provider:  [name], MD Primary Cardiologist:  None Primary Electrophysiologist: None   Past Medical History    Past Medical History:  Diagnosis Date   Coronary artery disease    Depression    GI bleed 07/17/2015   Hypertension    Myocardial infarction [facility])    Nocturia 08/14/2019   Refusal of blood transfusions as patient is Jehov[employer]'s [employer]     History of Present Illness  Tanya Walsh is a 68 y.o. female with a PMH of CAD NSTEMI 12/30/2023 with prior MI in [place] , details unknown, HTN, HLD, OSA, metastatic small intestine and liver CA, GI bleed who presents today for 3 month follow up.  She was last seen on 01/20/2024 for follow-up.  She had previously been seen in the ED for complaint of abdominal and chest pain.EKG and echocardiogram completed without significant abnormalities and troponins were elevated at 1600.  She also noted significant diarrhea with potassium of 2.6 and Troponins gradually increased [phone].  CTA of the chest was negative for PE and the patient underwent an LHC that showed 70% RCA stenosis with normal LVEDP and 30% stenosis in left main to ostial LAD and ostial circumflex lesion of 50%.  Decision was made to optimize medical therapy with consideration of PCI if refractory to medication.  During follow-up visit patient denied any chest pain but noted headache possibly related to Imdur .  She had dose reduced to 15 mg daily and was advised to start cardiac rehab.  She recently completed restaging PET/scan oncologist.  [name] presents today for 80-month follow-up. Tanya Walsh is a 68 year old female with coronary artery disease who presents for follow-up regarding cardiac symptoms and medication management.  She experiences palpitations at rest but not during physical activity, including cardiac rehab sessions. There is no chest pain similar to  her previous emergency room visit, though some discomfort is noted. She is nearing completion of her cardiac rehab. Her medication regimen includes isosorbide  mononitrate 15 mg, which was reduced due to headaches but has since resumed the full dose without issues. She also takes Crestor , Plavix , and aspirin  as part of her medical therapy. She reports improvement in arm pain and tingling after seeing a neurologist and having an MRI, but notes decreased hand strength since her heart attack. She has not followed up with the neurologist regarding this issue. She experiences shortness of breath that does not limit her activities. Some ankle swelling is noted, which the patient attributes to past sprains. Cold hands and sweating are also reported. She has a history of radiation treatment coinciding with her heart attack and has experienced fluctuations in blood sugar levels, with a significant drop during her second radiation treatment. Difficulty obtaining blood samples due to blood thinners is noted. She has a history of using a CPAP machine but discontinued it due to discomfort with a new hose. She feels exhausted upon waking and is considering resuming its use. She has made dietary changes, including consuming a drink with carrots, cucumbers, and apples, which she believes has affected her blood pressure and weight. Her urine has become more yellow, attributed to increased vitamin intake from her diet and supplements. Patient denies chest pain, palpitations, dyspnea, PND, orthopnea, nausea, vomiting, dizziness, syncope, edema, weight gain, or early satiety.  Discussed the use of AI scribe software for clinical note transcription with the patient, who gave verbal consent  to proceed.  History of Present Illness   Review of Systems  Please see the history of present illness.    All other systems reviewed and are otherwise negative except as noted above.  Physical Exam    Wt Readings from Last 3 Encounters:   04/19/24 179 lb 3.2 oz (81.3 kg)  03/20/24 178 lb (80.7 kg)  03/06/24 180 lb 12.4 oz (82 kg)   CD:Uyzmz were no vitals filed for this visit.,There is no height or weight on file to calculate BMI. GEN: Well nourished, well developed in no acute distress Neck: No JVD; No carotid bruits Pulmonary: Clear to auscultation without rales, wheezing or rhonchi  Cardiovascular: Normal rate. Regular rhythm. Normal S1. Normal S2.   Murmurs: There is no murmur.  ABDOMEN: Soft, non-tender, non-distended EXTREMITIES:  No edema; No deformity   EKG/LABS/ Recent Cardiac Studies   ECG personally reviewed by me today -none completed today  Risk Assessment/Calculations:          Lab Results  Component Value Date   WBC 3.4 (L) 04/19/2024   HGB 12.3 04/19/2024   HCT 37.9 04/19/2024   MCV 89.6 04/19/2024   PLT 272 04/19/2024   Lab Results  Component Value Date   CREATININE 0.87 04/19/2024   BUN 10 04/19/2024   NA 139 04/19/2024   K 4.0 04/19/2024   CL 103 04/19/2024   CO2 32 04/19/2024   Lab Results  Component Value Date   CHOL 117 12/31/2023   HDL 53 12/31/2023   LDLCALC 52 12/31/2023   TRIG 61 12/31/2023   CHOLHDL 2.2 12/31/2023    Lab Results  Component Value Date   HGBA1C 7.5 (H) 12/29/2023   Assessment & Plan   Assessment & Plan  1.Coronary artery disease: -s/p NSTEMI with LHC on 12/30/2023 showing 50% ostial circumflex lesion with 30% distal left main to ostial LAD lesion and 70% proximal RCA lesion treated medically with Plavix  and Imdur . -Well-managed post-myocardial infarction. No refractory chest pain. Tolerating isosorbide  mononitrate. Intermittent shortness of breath. Mild peripheral edema. - Continue isosorbide  mononitrate 30 mg daily. - Continue Crestor , Plavix , and aspirin . - Encourage continuation of cardiac rehab and regular exercise post-rehab. - Monitor for breakthrough chest pain. - Recheck cholesterol at six-month follow-up.  2.  Essential hypertension: -  Patient's blood pressure today was stable at 90/60 following medications this morning - She is asymptomatic and advised to contact us  if she develops any symptoms such as dizziness or fatigue. - Continue Hyzaar 50-12.5 mg daily, Imdur  30 mg daily  3.Hyperlipidemia:  -Well-controlled on statin therapy with LDL at 52. - Recheck cholesterol at six-month follow-up.  4.History of OSA: - CPAP previously beneficial. Discontinued due to discomfort. Discussed benefits of CPAP for cardiac health. - Order home sleep study Engineer, [place]). - If sleep apnea still present will coordinate with sleep coordinator for new mask and hose.  5. Malignant neoplasm of small intestine: - She is currently undergoing treatment chemotherapy injections. - Continue current treatment plan per oncology.  Disposition: Follow-up with Dr. Shlomo or APP in 6 months    Signed, Wyn Raddle, Jackee Shove, NP 04/29/2024, 1:26 PM [facility] "

## 2024-04-30 ENCOUNTER — Other Ambulatory Visit: Payer: Self-pay

## 2024-04-30 ENCOUNTER — Encounter: Payer: Self-pay | Admitting: Nurse Practitioner

## 2024-04-30 ENCOUNTER — Ambulatory Visit: Attending: Nurse Practitioner | Admitting: Nurse Practitioner

## 2024-04-30 VITALS — BP 90/60 | HR 88 | Ht 63.0 in | Wt 176.0 lb

## 2024-04-30 DIAGNOSIS — I2510 Atherosclerotic heart disease of native coronary artery without angina pectoris: Secondary | ICD-10-CM | POA: Diagnosis not present

## 2024-04-30 DIAGNOSIS — I10 Essential (primary) hypertension: Secondary | ICD-10-CM

## 2024-04-30 DIAGNOSIS — G4733 Obstructive sleep apnea (adult) (pediatric): Secondary | ICD-10-CM

## 2024-04-30 DIAGNOSIS — C7A8 Other malignant neuroendocrine tumors: Secondary | ICD-10-CM

## 2024-04-30 DIAGNOSIS — E785 Hyperlipidemia, unspecified: Secondary | ICD-10-CM | POA: Diagnosis not present

## 2024-04-30 NOTE — Patient Instructions (Addendum)
 Medication Instructions:  Your physician recommends that you continue on your current medications as directed. Please refer to the Current Medication list given to you today.  *If you need a refill on your cardiac medications before your next appointment, please call your pharmacy*  Lab Work: 6 months FASTING LABS LIPIDs & LFTs (on 10/31/2024 or week of) If you have labs (blood work) drawn today and your tests are completely normal, you will receive your results only by: MyChart Message (if you have MyChart) OR A paper copy in the mail If you have any lab test that is abnormal or we need to change your treatment, we will call you to review the results.  Testing/Procedures: PUJFJM SLEEP STUDY   Follow-Up: At [facility], you and your health needs are our priority.  As part of our continuing mission to provide you with exceptional heart care, our providers are all part of one team.  This team includes your primary Cardiologist (physician) and Advanced Practice Providers or APPs (Physician Assistants and Nurse Practitioners) who all work together to provide you with the care you need, when you need it.  Your next appointment:   6 month(s)  Provider:   Wilbert Bihari, MD  We recommend signing up for the patient portal called MyChart.  Sign up information is provided on this After Visit Summary.  MyChart is used to connect with patients for Virtual Visits (Telemedicine).  Patients are able to view lab/test results, encounter notes, upcoming appointments, etc.  Non-urgent messages can be sent to your provider as well.   To learn more about what you can do with MyChart, go to [email or web address].   Other Instructions

## 2024-05-02 ENCOUNTER — Ambulatory Visit (HOSPITAL_COMMUNITY)

## 2024-05-02 ENCOUNTER — Encounter (HOSPITAL_COMMUNITY)
Admission: RE | Admit: 2024-05-02 | Discharge: 2024-05-02 | Disposition: A | Discharge status: Home / Self Care | Source: Ambulatory Visit | Attending: Cardiology

## 2024-05-02 DIAGNOSIS — I214 Non-ST elevation (NSTEMI) myocardial infarction: Secondary | ICD-10-CM

## 2024-05-02 DIAGNOSIS — I252 Old myocardial infarction: Secondary | ICD-10-CM | POA: Diagnosis not present

## 2024-05-03 NOTE — Progress Notes (Signed)
 CARDIAC REHAB PHASE 2  Reviewed home exercise with pt today. Pt is tolerating exercise well. Pt will continue to exercise on her own by walking and doing bodyweight exercises for 30-45 minutes per session 3 days a week in addition to the 3 days in CRP2. Advised pt on THRR, RPE scale, hydration and temperature/humidity precautions. Reinforced NTG use, S/S to stop exercise and when to call MD vs 911. Encouraged warm up cool down and stretches with exercise sessions. Pt verbalized understanding, all questions were answered and pt was given a copy to take home.    Geoffry Bannister S Kodie Pick ACSM-CEP

## 2024-05-04 ENCOUNTER — Ambulatory Visit (HOSPITAL_COMMUNITY)

## 2024-05-04 ENCOUNTER — Encounter (HOSPITAL_COMMUNITY)
Admission: RE | Admit: 2024-05-04 | Discharge: 2024-05-04 | Disposition: A | Discharge status: Home / Self Care | Source: Ambulatory Visit | Attending: Cardiology | Admitting: Cardiology

## 2024-05-04 DIAGNOSIS — I214 Non-ST elevation (NSTEMI) myocardial infarction: Secondary | ICD-10-CM

## 2024-05-04 DIAGNOSIS — I252 Old myocardial infarction: Secondary | ICD-10-CM | POA: Diagnosis not present

## 2024-05-07 ENCOUNTER — Telehealth (HOSPITAL_COMMUNITY): Payer: Self-pay

## 2024-05-07 ENCOUNTER — Encounter (HOSPITAL_COMMUNITY): Admission: RE | Admit: 2024-05-07 | Source: Ambulatory Visit

## 2024-05-07 ENCOUNTER — Ambulatory Visit (HOSPITAL_COMMUNITY)

## 2024-05-07 NOTE — Telephone Encounter (Signed)
 Patient left message calling out sick for 1:45 CR class.

## 2024-05-09 ENCOUNTER — Ambulatory Visit (HOSPITAL_COMMUNITY)

## 2024-05-09 ENCOUNTER — Encounter (HOSPITAL_COMMUNITY)
Admission: RE | Admit: 2024-05-09 | Discharge: 2024-05-09 | Disposition: A | Discharge status: Home / Self Care | Source: Ambulatory Visit | Attending: Cardiology | Admitting: Cardiology

## 2024-05-09 DIAGNOSIS — I214 Non-ST elevation (NSTEMI) myocardial infarction: Secondary | ICD-10-CM

## 2024-05-09 DIAGNOSIS — I252 Old myocardial infarction: Secondary | ICD-10-CM | POA: Diagnosis not present

## 2024-05-11 ENCOUNTER — Encounter (HOSPITAL_COMMUNITY)
Admission: RE | Admit: 2024-05-11 | Discharge: 2024-05-11 | Disposition: A | Discharge status: Home / Self Care | Source: Ambulatory Visit | Attending: Cardiology | Admitting: Cardiology

## 2024-05-11 ENCOUNTER — Ambulatory Visit (HOSPITAL_COMMUNITY)

## 2024-05-11 DIAGNOSIS — I214 Non-ST elevation (NSTEMI) myocardial infarction: Secondary | ICD-10-CM

## 2024-05-11 DIAGNOSIS — I252 Old myocardial infarction: Secondary | ICD-10-CM | POA: Diagnosis not present

## 2024-05-16 ENCOUNTER — Ambulatory Visit (HOSPITAL_COMMUNITY)

## 2024-05-16 ENCOUNTER — Encounter (HOSPITAL_COMMUNITY)
Admission: RE | Admit: 2024-05-16 | Discharge: 2024-05-16 | Disposition: A | Discharge status: Home / Self Care | Source: Ambulatory Visit | Attending: Cardiology | Admitting: Cardiology

## 2024-05-16 ENCOUNTER — Telehealth: Payer: Self-pay

## 2024-05-16 DIAGNOSIS — I214 Non-ST elevation (NSTEMI) myocardial infarction: Secondary | ICD-10-CM | POA: Diagnosis present

## 2024-05-16 NOTE — Telephone Encounter (Incomplete)
 Ordering provider: Jackee Alberts Associated diagnoses:  I25.10(CAD) I10 ( Essential hypertension) G47.33 (Obstructive sleep apnea) WatchPAT PA obtained on 05/16/2024 by Dena JAYSON Hesselbach, CMA. Authorization: No prior authorization is required per [facility] provider portal  Decision ID #: [id number] Patient notified of PIN ([id number]) on 05/16/2024 via {Notification Method:30571}.  Phone note routed to covering staff for follow-up.

## 2024-05-17 ENCOUNTER — Inpatient Hospital Stay: Attending: Hematology

## 2024-05-17 VITALS — BP 110/80 | HR 86 | Resp 14

## 2024-05-17 DIAGNOSIS — C7B8 Other secondary neuroendocrine tumors: Secondary | ICD-10-CM | POA: Diagnosis present

## 2024-05-17 DIAGNOSIS — C7A8 Other malignant neuroendocrine tumors: Secondary | ICD-10-CM | POA: Insufficient documentation

## 2024-05-17 MED ORDER — OCTREOTIDE ACETATE 30 MG IM KIT
30.0000 mg | PACK | Freq: Once | INTRAMUSCULAR | Status: AC
  Administered 2024-05-17: 30 mg via INTRAMUSCULAR
  Filled 2024-05-17: qty 1

## 2024-05-18 ENCOUNTER — Encounter (HOSPITAL_COMMUNITY)
Admission: RE | Admit: 2024-05-18 | Discharge: 2024-05-18 | Disposition: A | Discharge status: Home / Self Care | Source: Ambulatory Visit | Attending: Cardiology | Admitting: Cardiology

## 2024-05-18 DIAGNOSIS — I214 Non-ST elevation (NSTEMI) myocardial infarction: Secondary | ICD-10-CM

## 2024-05-21 ENCOUNTER — Ambulatory Visit: Payer: Self-pay | Admitting: Cardiology

## 2024-05-21 ENCOUNTER — Encounter (HOSPITAL_COMMUNITY)
Admission: RE | Admit: 2024-05-21 | Discharge: 2024-05-21 | Disposition: A | Discharge status: Home / Self Care | Source: Ambulatory Visit | Attending: Cardiology | Admitting: Cardiology

## 2024-05-21 DIAGNOSIS — I214 Non-ST elevation (NSTEMI) myocardial infarction: Secondary | ICD-10-CM | POA: Diagnosis not present

## 2024-05-21 LAB — GLUCOSE, CAPILLARY: Glucose-Capillary: 133 mg/dL — ABNORMAL HIGH (ref 70–99)

## 2024-05-22 NOTE — Progress Notes (Signed)
 Cardiac Individual Treatment Plan  Patient Details  Name: MANETTE DOTO MRN: [id number] Date of Birth: 1956-08-30 Referring Provider:   Flowsheet Row INTENSIVE CARDIAC REHAB ORIENT from 03/06/2024 in [facility] for Heart, Vascular, & Lung Health  Referring Provider Wilbert Bihari, MD    Initial Encounter Date:  Flowsheet Row INTENSIVE CARDIAC REHAB ORIENT from 03/06/2024 in [facility] for Heart, Vascular, & Lung Health  Date 03/06/24    Visit Diagnosis: 12/29/23 NSTEMI (non-ST elevated myocardial infarction) [facility])  Patient's Home Medications on Admission:  Current Outpatient Medications:    acetaminophen  (TYLENOL ) 650 MG CR tablet, Take 650 mg by mouth every 8 (eight) hours as needed for pain., Disp: , Rfl:    aspirin  EC 81 MG tablet, Take 1 tablet (81 mg total) by mouth daily. Swallow whole., Disp: 90 tablet, Rfl: 1   clopidogrel  (PLAVIX ) 75 MG tablet, Take 1 tablet (75 mg total) by mouth daily., Disp: 90 tablet, Rfl: 1   cyclobenzaprine  (FLEXERIL ) 5 MG tablet, Take 1 tablet (5 mg total) by mouth 3 (three) times daily as needed for muscle spasms., Disp: 30 tablet, Rfl: 0   DULoxetine  (CYMBALTA ) 60 MG capsule, Take 60 mg by mouth in the morning., Disp: , Rfl:    fluticasone (FLONASE) 50 MCG/ACT nasal spray, Place 1 spray into both nostrils as needed for allergies or rhinitis., Disp: , Rfl:    folic acid  (FOLVITE ) 800 MCG tablet, Take 400 mcg by mouth in the morning., Disp: , Rfl:    gabapentin  (NEURONTIN ) 100 MG capsule, Take 1 capsule (100 mg total) by mouth at bedtime., Disp: 30 capsule, Rfl: 5   isosorbide  mononitrate (IMDUR ) 30 MG 24 hr tablet, Take 0.5 tablets (15 mg total) by mouth daily. Can take a whole tablet if needed, Disp: 90 tablet, Rfl: 1   losartan -hydrochlorothiazide  (HYZAAR) 50-12.5 MG tablet, Take 1 tablet by mouth in the morning., Disp: , Rfl:    metFORMIN  (GLUCOPHAGE -XR) 500 MG 24 hr tablet, Take 500 mg by mouth daily  with breakfast., Disp: , Rfl:    Multiple Vitamins-Minerals (MULTIVITAMIN ADULTS 50+) TABS, Take 1 tablet by mouth daily., Disp: , Rfl:    nitroGLYCERIN  (NITROSTAT ) 0.4 MG SL tablet, Place 1 tablet (0.4 mg total) under the tongue every 5 (five) minutes as needed for chest pain., Disp: 30 tablet, Rfl: 0   ondansetron  (ZOFRAN ) 8 MG tablet, Take 1 tablet (8 mg total) by mouth 2 (two) times daily as needed for nausea or vomiting., Disp: 20 tablet, Rfl: 0   pantoprazole  (PROTONIX ) 40 MG tablet, Take 40 mg by mouth in the morning., Disp: , Rfl:    rosuvastatin  (CRESTOR ) 20 MG tablet, Take 20 mg by mouth in the morning., Disp: , Rfl:    traMADol  (ULTRAM ) 50 MG tablet, Take 1 tablet (50 mg total) by mouth every 12 (twelve) hours as needed., Disp: 20 tablet, Rfl: 0   trolamine salicylate (ASPERCREME) 10 % cream, Apply 1 application topically as needed for muscle pain., Disp: , Rfl:   Past Medical History: Past Medical History:  Diagnosis Date   Coronary artery disease    Depression    GI bleed 07/17/2015   Hypertension    Myocardial infarction [facility])    Nocturia 08/14/2019   Refusal of blood transfusions as patient is Jehovah's Witness     Tobacco Use: Social History   Tobacco Use  Smoking Status Never  Smokeless Tobacco Never    Labs: Review Flowsheet  More data exists  Latest Ref Rng & Units 04/08/2010 12/25/2019 08/29/2022 12/29/2023 12/31/2023  Labs for ITP Cardiac and Pulmonary Rehab  Cholestrol 0 - 200 mg/dL - 854  - - 882   LDL (calc) 0 - 99 mg/dL - 71  - - 52   HDL-C >59 mg/dL - 61  - - 53   Trlycerides <150 mg/dL - 62  - - 61   Hemoglobin A1c 4.8 - 5.6 % - - - 7.5  -  TCO2 22 - 32 mmol/L 25  - 27  22  -    Capillary Blood Glucose: Lab Results  Component Value Date   GLUCAP 133 (H) 05/21/2024   GLUCAP 91 03/26/2024   GLUCAP 120 (H) 03/14/2024   GLUCAP 157 (H) 03/14/2024   GLUCAP 117 (H) 03/12/2024     Exercise Target Goals: Exercise Program Goal: Individual  exercise prescription set using results from initial 6 min walk test and THRR while considering  patient's activity barriers and safety.   Exercise Prescription Goal: Initial exercise prescription builds to 30-45 minutes a day of aerobic activity, 2-3 days per week.  Home exercise guidelines will be given to patient during program as part of exercise prescription that the participant will acknowledge.  Activity Barriers & Risk Stratification:  Activity Barriers & Cardiac Risk Stratification - 03/06/24 1340       Activity Barriers & Cardiac Risk Stratification   Activity Barriers Back Problems;Deconditioning;Balance Concerns    Cardiac Risk Stratification High          6 Minute Walk:  6 Minute Walk     Row Name 03/06/24 1534         6 Minute Walk   Phase Initial     Distance 1308 feet     Walk Time 6 minutes     # of Rest Breaks 0     MPH 2.5     METS 2.6     RPE 11     Perceived Dyspnea  1     VO2 Peak 9.03     Symptoms Yes (comment)     Comments Mild SOB, RPD =1; low back pain 7/10, rt hip pain 4/10     Resting HR 64 bpm     Resting BP 130/78     Resting Oxygen Saturation  95 %     Exercise Oxygen Saturation  during 6 min walk 98 %     Max Ex. HR 95 bpm     Max Ex. BP 134/80        Oxygen Initial Assessment:   Oxygen Re-Evaluation:   Oxygen Discharge (Final Oxygen Re-Evaluation):   Initial Exercise Prescription:  Initial Exercise Prescription - 03/06/24 1500       Date of Initial Exercise RX and Referring Provider   Date 03/06/24    Referring Provider Wilbert Bihari, MD    Expected Discharge Date 05/30/24      NuStep   Level 2    SPM 75    Minutes 30    METs 2.6      Prescription Details   Frequency (times per week) 3    Duration Progress to 30 minutes of continuous aerobic without signs/symptoms of physical distress      Intensity   THRR 40-80% of Max Heartrate 60-120    Ratings of Perceived Exertion 11-13    Perceived Dyspnea 0-4       Progression   Progression Continue progressive overload as per policy without signs/symptoms or physical distress.  Resistance Training   Training Prescription Yes    Weight 3lbs    Reps 10-15          Perform Capillary Blood Glucose checks as needed.  Exercise Prescription Changes:   Exercise Prescription Changes     Row Name 03/12/24 1637 04/18/24 0948 05/03/24 1338 05/11/24 1657       Response to Exercise   Blood Pressure (Admit) 126/68 118/66 110/70 104/70    Blood Pressure (Exercise) 142/86 -- -- 142/82    Blood Pressure (Exit) 122/76 124/84 110/62 126/68    Heart Rate (Admit) 77 bpm 74 bpm 82 bpm 85 bpm    Heart Rate (Exercise) 101 bpm 113 bpm 112 bpm 119 bpm    Heart Rate (Exit) 85 bpm 83 bpm 88 bpm 83 bpm    Rating of Perceived Exertion (Exercise) 11 10 11 7     Perceived Dyspnea (Exercise) 0 0 0 0    Symptoms 0 0 0 0    Comments Pt first day in the [facility] program Reviewed MET's and goals Reviewed MET's and home ExRx Reviewed MET's and goals    Duration Progress to 30 minutes of  aerobic without signs/symptoms of physical distress Progress to 30 minutes of  aerobic without signs/symptoms of physical distress Progress to 30 minutes of  aerobic without signs/symptoms of physical distress Progress to 30 minutes of  aerobic without signs/symptoms of physical distress    Intensity THRR unchanged THRR unchanged THRR unchanged THRR unchanged      Progression   Progression Continue to progress workloads to maintain intensity without signs/symptoms of physical distress. Continue to progress workloads to maintain intensity without signs/symptoms of physical distress. Continue to progress workloads to maintain intensity without signs/symptoms of physical distress. Continue to progress workloads to maintain intensity without signs/symptoms of physical distress.    Average METs 2.2 3.06 2.77 2.67      Resistance Training   Training Prescription Yes Yes No Yes    Weight  3lbs 3lbs 3lbs 3lbs    Reps 10-15 10-15 10-15 10-15    Time 1010 Minutes 10 Minutes 10 Minutes 10 Minutes      NuStep   Level 2 3 3 3     SPM 87 113 107 104    Minutes 30 15 15 15     METs 2.2 3.2 3 2.8      Track   Laps -- 15 12 12     Minutes -- 15 15 15     METs -- 3.2 2.53 2.53      Home Exercise Plan   Plans to continue exercise at -- -- Home (comment) Home (comment)    Frequency -- -- Add 3 additional days to program exercise sessions. Add 3 additional days to program exercise sessions.    Initial Home Exercises Provided -- -- 05/02/24 05/02/24       Exercise Comments:   Exercise Comments     Row Name 03/12/24 1643 04/16/24 1630 05/02/24 1630 05/11/24 1702     Exercise Comments Pt first day in the [facility] program. Pt tolerated exercise well with an average MET level of 2.2. She is off to a good start and is learning her THRR, RPE and ExRx Reviewed MET's and goals. Pt tolerated exercise well with an average MET level of 3.06. Pt is feeling good and progressing MET's and workloads. She is walking the track, this was a goal to be able to walk more and she is doing very well. She already feels an  increase in strength and stamina Reviewed MET's and home ExRx. Pt tolerated exercise well with an average MET level of 2.77. She is doing well and has added in walking and body weight exercises at home for 3 days for about 30-45 mins. Reviewed MET's and goals. Pt tolerated exercise well with an average MET level of 3.67. Pt is feeling good. Attempted the treadmill a little while ago, but liked the track better, so switched back. She is doing well. She is getting a quick pace on Nustep, encouraged WL increase next week if she feels able       Exercise Goals and Review:   Exercise Goals     Row Name 03/06/24 1339             Exercise Goals   Increase Physical Activity Yes       Intervention Provide advice, education, support and counseling about physical activity/exercise  needs.;Develop an individualized exercise prescription for aerobic and resistive training based on initial evaluation findings, risk stratification, comorbidities and participant's personal goals.       Expected Outcomes Short Term: Attend rehab on a regular basis to increase amount of physical activity.;Long Term: Add in home exercise to make exercise part of routine and to increase amount of physical activity.;Long Term: Exercising regularly at least 3-5 days a week.       Increase Strength and Stamina Yes       Intervention Provide advice, education, support and counseling about physical activity/exercise needs.;Develop an individualized exercise prescription for aerobic and resistive training based on initial evaluation findings, risk stratification, comorbidities and participant's personal goals.       Expected Outcomes Short Term: Increase workloads from initial exercise prescription for resistance, speed, and METs.;Short Term: Perform resistance training exercises routinely during rehab and add in resistance training at home;Long Term: Improve cardiorespiratory fitness, muscular endurance and strength as measured by increased METs and functional capacity ( )       Able to understand and use rate of perceived exertion (RPE) scale Yes       Intervention Provide education and explanation on how to use RPE scale       Expected Outcomes Short Term: Able to use RPE daily in rehab to express subjective intensity level;Long Term:  Able to use RPE to guide intensity level when exercising independently       Knowledge and understanding of Target Heart Rate Range (THRR) Yes       Intervention Provide education and explanation of THRR including how the numbers were predicted and where they are located for reference       Expected Outcomes Short Term: Able to state/look up THRR;Short Term: Able to use daily as guideline for intensity in rehab;Long Term: Able to use THRR to govern intensity when exercising  independently       Understanding of Exercise Prescription Yes       Intervention Provide education, explanation, and written materials on patient's individual exercise prescription       Expected Outcomes Short Term: Able to explain program exercise prescription;Long Term: Able to explain home exercise prescription to exercise independently          Exercise Goals Re-Evaluation :  Exercise Goals Re-Evaluation     Row Name 03/12/24 1641 04/16/24 1630 05/11/24 1700         Exercise Goal Re-Evaluation   Exercise Goals Review Increase Physical Activity;Understanding of Exercise Prescription;Knowledge and understanding of Target Heart Rate Range (THRR);Increase Strength and Stamina;Able to understand and  use rate of perceived exertion (RPE) scale Increase Physical Activity;Understanding of Exercise Prescription;Knowledge and understanding of Target Heart Rate Range (THRR);Increase Strength and Stamina;Able to understand and use rate of perceived exertion (RPE) scale Increase Physical Activity;Understanding of Exercise Prescription;Knowledge and understanding of Target Heart Rate Range (THRR);Increase Strength and Stamina;Able to understand and use rate of perceived exertion (RPE) scale     Comments Pt first day in the [facility] program. Pt tolerated exercise well with an average MET level of 2.2. She is off to a good start and is learning her THRR, RPE and ExRx Reviewed MET's and goals. Pt tolerated exercise well with an average MET level of 3.06. Pt is feeling good and progressing MET's and workloads. She is walking the track, this was a goal to be able to walk more and she is doing very well. She already feels an increase in strength and stamina Reviewed MET's and goals. Pt tolerated exercise well with an average MET level of 3.67. Pt is feeling good. Attempted the treadmill a little while ago, but liked the track better, so switched back. She is doing well. She is getting a quick pace on Nustep,  encouraged WL increase next week if she feels able     Expected Outcomes Will continue to monitor pt and progress workloads as tolerated without sign or symptom Will continue to monitor pt and progress workloads as tolerated without sign or symptom Will continue to monitor pt and progress workloads as tolerated without sign or symptom        Discharge Exercise Prescription (Final Exercise Prescription Changes):  Exercise Prescription Changes - 05/11/24 1657       Response to Exercise   Blood Pressure (Admit) 104/70    Blood Pressure (Exercise) 142/82    Blood Pressure (Exit) 126/68    Heart Rate (Admit) 85 bpm    Heart Rate (Exercise) 119 bpm    Heart Rate (Exit) 83 bpm    Rating of Perceived Exertion (Exercise) 7    Perceived Dyspnea (Exercise) 0    Symptoms 0    Comments Reviewed MET's and goals    Duration Progress to 30 minutes of  aerobic without signs/symptoms of physical distress    Intensity THRR unchanged      Progression   Progression Continue to progress workloads to maintain intensity without signs/symptoms of physical distress.    Average METs 2.67      Resistance Training   Training Prescription Yes    Weight 3lbs    Reps 10-15    Time 10 Minutes      NuStep   Level 3    SPM 104    Minutes 15    METs 2.8      Track   Laps 12    Minutes 15    METs 2.53      Home Exercise Plan   Plans to continue exercise at Home (comment)    Frequency Add 3 additional days to program exercise sessions.    Initial Home Exercises Provided 05/02/24          Nutrition:  Target Goals: Understanding of nutrition guidelines, daily intake of sodium 1500mg , cholesterol 200mg , calories 30% from fat and 7% or less from saturated fats, daily to have 5 or more servings of fruits and vegetables.  Biometrics:  Pre Biometrics - 03/06/24 1400       Pre Biometrics   Waist Circumference 39.5 inches    Hip Circumference 47 inches    Waist to  Hip Ratio 0.84 %    Triceps  Skinfold 24 mm    % Body Fat 42.4 %    Grip Strength 12 kg    Flexibility --   Not performed, active chronic low back pain 4/10   Single Leg Stand 2 seconds           Nutrition Therapy Plan and Nutrition Goals:  Nutrition Therapy & Goals - 04/16/24 1055       Nutrition Therapy   Diet Heart Healthy Diet    Drug/Food Interactions Statins/Certain Fruits      Personal Nutrition Goals   Nutrition Goal Patient to identify strategies for reducing cardiovascular risk by attending the [facility] education and nutrition series weekly.   goal in progress.   Personal Goal #2 Patient to improve diet quality by using the plate method as a guide for meal planning to include lean protein/plant protein, fruits, vegetables, whole grains, nonfat dairy as part of a well-balanced diet.   goal in progress.   Comments Goals in progress. 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 She continues to attend the [facility] education and nutrition series regularly. Lipids are well controlled at goal (treated with metformin ). A1c is not at goal. She is down 1.1# since starting with our program.  Patient will benefit from participation in intensive cardiac rehab for nutrition education, exercise, and lifestyle modification.      Intervention Plan   Intervention Prescribe, educate and counsel regarding individualized specific dietary modifications aiming towards targeted core components such as weight, hypertension, lipid management, diabetes, heart failure and other comorbidities.;Nutrition handout(s) given to patient.    Expected Outcomes Short Term Goal: Understand basic principles of dietary content, such as calories, fat, sodium, cholesterol and nutrients.;Long Term Goal: Adherence to prescribed nutrition plan.          Nutrition Assessments:  Nutrition Assessments - 04/19/24 1009       Rate Your Plate Scores   Pre Score 56          MEDIFICTS Score Key: >=70 Need to make dietary changes  40-70 Heart Healthy Diet <= 40 Therapeutic Level Cholesterol Diet   Flowsheet Row INTENSIVE CARDIAC REHAB from 04/18/2024 in [facility] for Heart, Vascular, & Lung Health  Picture Your Plate Total Score on Admission 56   Picture Your Plate Scores: <59 Unhealthy dietary pattern with much room for improvement. 41-50 Dietary pattern unlikely to meet recommendations for good health and room for improvement. 51-60 More healthful dietary pattern, with some room for improvement.  >60 Healthy dietary pattern, although there may be some specific behaviors that could be improved.    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 Lipids are well controlled at goal. A1c is not at goal. Patient will benefit from participation in intensive cardiac rehab for nutrition education, exercise, and lifestyle modification. Goals in progress. Leilanee has medical history of CAD w/ stenosis, NSTEMI, HTN, OSA, metatastatic neuroendocrine tumor to liver and  small intestine. She continues to attend the [facility] education and nutrition series regularly. Lipids are well controlled at goal (treated with metformin ). A1c is not at goal. She is down 1.1# since starting with our program. Patient will benefit from participation in intensive cardiac rehab for nutrition education, exercise, and lifestyle modification.         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 Lipids are well controlled at goal. A1c is not at goal. Patient will benefit from participation in intensive cardiac rehab for nutrition education, exercise, and lifestyle modification. Goals in progress. 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 She continues to attend the [facility] education and nutrition series regularly. Lipids are well controlled at goal (treated with metformin ). A1c is not at goal. She is down 1.1# since starting with our program. Patient will benefit from participation in intensive cardiac rehab for nutrition education, exercise, and lifestyle modification.         Nutrition Goals Discharge (Final Nutrition Goals Re-Evaluation):  Nutrition Goals Re-Evaluation - 04/16/24 1055       Goals   Current Weight 179 lb 10.8 oz (81.5 kg)    Comment LDL 52, Lpa 108, A1c 7.5    Expected Outcome Goals in progress. 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 She continues to attend the [facility] education and nutrition series regularly. Lipids are well controlled at goal (treated with metformin ). A1c is not at goal. She is down 1.1# since starting with our program. Patient will benefit from participation in intensive cardiac rehab for nutrition education, exercise, and lifestyle modification.          Psychosocial: Target Goals: Acknowledge presence or absence of significant depression and/or stress, maximize coping skills, provide positive support system. Participant is able to verbalize types and ability to use techniques and skills needed for reducing stress and  depression.  Initial Review & Psychosocial Screening:  Initial Psych Review & Screening - 03/06/24 1449       Initial Review   Current issues with Current Depression;Current Psychotropic Meds;History of Depression;Current Stress Concerns    Source of Stress Concerns Chronic Illness;Unable to participate in former interests or hobbies    Comments Pt attributes some of this to her CA diagnosis/treatment      Family Dynamics   Good Support System? Yes   Pt has brothers and sisters and her church community     Barriers   Psychosocial barriers to participate in program The patient should benefit from training in stress management and relaxation.      Screening Interventions   Interventions Encouraged to exercise    Expected Outcomes Short Term goal: Utilizing psychosocial counselor, staff and physician to assist with identification of specific Stressors or current issues interfering with healing process. Setting desired goal for each stressor or current issue identified.;Long Term Goal: Stressors or current issues are controlled or eliminated.;Short Term goal: Identification and  review with participant of any Quality of Life or Depression concerns found by scoring the questionnaire.;Long Term goal: The participant improves quality of Life and PHQ9 Scores as seen by post scores and/or verbalization of changes          Quality of Life Scores:  Quality of Life - 03/06/24 1445       Quality of Life   Select Quality of Life      Quality of Life Scores   Health/Function Pre 19.19 %    Socioeconomic Pre 28 %    Psych/Spiritual Pre 30 %    Family Pre 27 %    GLOBAL Pre 24.02 %         Scores of 19 and below usually indicate a poorer quality of life in these areas.  A difference of  2-3 points is a clinically meaningful difference.  A difference of 2-3 points in the total score of the Quality of Life Index has been associated with significant improvement in overall quality of life,  self-image, physical symptoms, and general health in studies assessing change in quality of life.  PHQ-9: Review Flowsheet       03/06/2024  Depression screen PHQ 2/9  Decreased Interest 2  Down, Depressed, Hopeless 2  PHQ - 2 Score 4  Altered sleeping 0  Tired, decreased energy 1  Change in appetite 2  Feeling bad or failure about yourself  0  Trouble concentrating 0  Moving slowly or fidgety/restless 1  Suicidal thoughts 0  PHQ-9 Score 8  Difficult doing work/chores Somewhat difficult   Interpretation of Total Score  Total Score Depression Severity:  1-4 = Minimal depression, 5-9 = Mild depression, 10-14 = Moderate depression, 15-19 = Moderately severe depression, 20-27 = Severe depression   Psychosocial Evaluation and Intervention:   Psychosocial Re-Evaluation:  Psychosocial Re-Evaluation     Row Name 03/12/24 1737 03/27/24 1520 04/23/24 1806 05/22/24 1441       Psychosocial Re-Evaluation   Current issues with Current Depression;History of Depression;Current Psychotropic Meds;Current Stress Concerns Current Depression;History of Depression;Current Psychotropic Meds;Current Stress Concerns Current Depression;History of Depression;Current Psychotropic Meds;Current Stress Concerns Current Depression;History of Depression;Current Psychotropic Meds;Current Stress Concerns    Comments Teegan says that she has had less energy  and has experienced some depression since her cancer and newly  diagnosed cardiac diagnosis. Halena says the antidepressant is controlling her depression. Renlee has good family support and is not interested in counseling at this time. Vidhi says the antidepressant is controlling her depression. Vida has not voiced any increased concerns or stressors during exercise at cardiac rehab. Nakkia has not voiced any increased concerns or stressors during exercise at cardiac rehab. Riham continues not to voice any increased concerns or stressors  during exercise at cardiac rehab. Lailie will complete cardiac rehab on 05/28/24    Expected Outcomes Ardeth will have controlled or decreased depression/ stressor supon completion of cardiac rehab. Netanya is going through chemotherapy and radiation and will attend cardiac rehab as she can tolerate. Emaley will have controlled or decreased depression/ stressor supon completion of cardiac rehab. Ksenia is going through chemotherapy and radiation and will attend cardiac rehab as she can tolerate. Nashayla will have controlled or decreased depression/ stressor supon completion of cardiac rehab. Feven is going through chemotherapy and radiation and will attend cardiac rehab as she can tolerate. Neetu will have controlled or decreased depression/ stressor supon completion of cardiac rehab. Anshika is going through chemotherapy and radiation and will attend cardiac rehab as  she can tolerate.    Interventions Stress management education;Relaxation education;Encouraged to attend Cardiac Rehabilitation for the exercise Stress management education;Relaxation education;Encouraged to attend Cardiac Rehabilitation for the exercise Stress management education;Relaxation education;Encouraged to attend Cardiac Rehabilitation for the exercise Stress management education;Relaxation education;Encouraged to attend Cardiac Rehabilitation for the exercise    Continue Psychosocial Services  Follow up required by staff Follow up required by staff Follow up required by staff No Follow up required      Initial Review   Source of Stress Concerns Chronic Illness;Unable to participate in former interests or hobbies;Unable to perform yard/household activities Chronic Illness;Unable to participate in former interests or hobbies;Unable to perform yard/household activities Chronic Illness;Unable to participate in former interests or hobbies;Unable to perform yard/household activities Chronic Illness;Unable to  participate in former interests or hobbies;Unable to perform yard/household activities    Comments Will continue to montior and offer support as needed. Will continue to montior and offer support as needed. Will continue to montior and offer support as needed. Will continue to montior and offer support as needed.       Psychosocial Discharge (Final Psychosocial Re-Evaluation):  Psychosocial Re-Evaluation - 05/22/24 1441       Psychosocial Re-Evaluation   Current issues with Current Depression;History of Depression;Current Psychotropic Meds;Current Stress Concerns    Comments Jacole continues not to voice any increased concerns or stressors during exercise at cardiac rehab. Abri will complete cardiac rehab on 05/28/24    Expected Outcomes Tameeka will have controlled or decreased depression/ stressor supon completion of cardiac rehab. Violette is going through chemotherapy and radiation and will attend cardiac rehab as she can tolerate.    Interventions Stress management education;Relaxation education;Encouraged to attend Cardiac Rehabilitation for the exercise    Continue Psychosocial Services  No Follow up required      Initial Review   Source of Stress Concerns Chronic Illness;Unable to participate in former interests or hobbies;Unable to perform yard/household activities    Comments Will continue to montior and offer support as needed.          Vocational Rehabilitation: Provide vocational rehab assistance to qualifying candidates.   Vocational Rehab Evaluation & Intervention:  Vocational Rehab - 03/06/24 1341       Initial Vocational Rehab Evaluation & Intervention   Assessment shows need for Vocational Rehabilitation No   Retired         Education: Education Goals: Education classes will be provided on a weekly basis, covering required topics. Participant will state understanding/return demonstration of topics presented.    Education     Row Name 03/12/24  1400     Education   Cardiac Education Topics Pritikin   Glass blower/designer Nutrition   Nutrition Workshop Fueling a Forensic psychologist   Instruction Review Code 1- Teaching laboratory technician Start Time 1400   Class Stop Time 1445   Class Time Calculation (min) 45 min    Row Name 03/14/24 1400     Education   Cardiac Education Topics Pritikin   Customer service manager   Weekly Topic International Cuisine- Spotlight on the Blue Zones   Instruction Review Code 1- Verbalizes Understanding   Class Start Time 1355   Class Stop Time 1435   Class Time Calculation (min) 40 min    Row Name 03/19/24 1500     Education   Cardiac Education Topics Pritikin  Geographical information systems officer Psychosocial   Psychosocial Workshop Healthy Sleep for a Healthy Heart   Instruction Review Code 1- Tax inspector   Class Start Time 1400   Class Stop Time 1443   Class Time Calculation (min) 43 min    Row Name 03/26/24 1400     Education   Cardiac Education Topics Pritikin   Hospital doctor Education   General Education Hypertension and Heart Disease   Instruction Review Code 1- Verbalizes Understanding   Class Start Time 1403   Class Stop Time 1445   Class Time Calculation (min) 42 min    Row Name 03/28/24 1400     Education   Cardiac Education Topics Pritikin   Customer service manager   Weekly Topic Powerhouse Plant-Based Proteins   Instruction Review Code 1- Verbalizes Understanding   Class Start Time 1400   Class Stop Time 1440   Class Time Calculation (min) 40 min    Row Name 04/02/24 1400     Education   Cardiac Education Topics Pritikin   Geographical information systems officer  Psychosocial   Psychosocial Workshop From Head to Heart: The Power of a Healthy Outlook   Instruction Review Code 1- Verbalizes Understanding   Class Start Time 1400   Class Stop Time 1445   Class Time Calculation (min) 45 min    Row Name 04/04/24 1400     Education   Cardiac Education Topics Pritikin   Customer service manager   Weekly Topic Tasty Appetizers and Snacks   Instruction Review Code 1- Verbalizes Understanding   Class Start Time 1400   Class Stop Time 1440   Class Time Calculation (min) 40 min    Row Name 04/06/24 1300     Education   Cardiac Education Topics Pritikin   Hospital doctor Education   General Education Heart Disease Risk Reduction   Instruction Review Code 1- Verbalizes Understanding   Class Start Time 1403   Class Stop Time 1437   Class Time Calculation (min) 34 min    Row Name 04/09/24 1600     Education   Cardiac Education Topics Pritikin   Nurse, children's Exercise Physiologist   Select Psychosocial   Psychosocial Healthy Minds, Bodies, Hearts   Instruction Review Code 1- Verbalizes Understanding   Class Start Time 1415   Class Stop Time 1450   Class Time Calculation (min) 35 min    Row Name 04/11/24 1400     Education   Cardiac Education Topics Pritikin   Customer service manager   Weekly Topic Adding Flavor - Sodium-Free   Instruction Review Code 1- Verbalizes Understanding   Class Start Time 1345   Class Stop Time 1435   Class Time Calculation (min) 50 min    Row Name 04/13/24 1400     Education   Cardiac Education Topics Pritikin     Workshops   Biomedical scientist Exercise   Exercise Workshop Location manager  and Fall Prevention   Instruction Review Code 1- Verbalizes Understanding   Class Start Time 1402   Class Stop  Time 1441   Class Time Calculation (min) 39 min    Row Name 04/16/24 1500     Education   Cardiac Education Topics Pritikin   Glass blower/designer Nutrition   Nutrition Workshop Label Reading   Instruction Review Code 1- Verbalizes Understanding   Class Start Time 1400   Class Stop Time 1450   Class Time Calculation (min) 50 min    Row Name 04/18/24 1500     Education   Cardiac Education Topics Pritikin   Customer service manager   Weekly Topic Fast and Healthy Breakfasts   Instruction Review Code 1- Verbalizes Understanding   Class Start Time 1400   Class Stop Time 1440   Class Time Calculation (min) 40 min    Row Name 04/20/24 1400     Education   Cardiac Education Topics Pritikin   Nurse, children's Nurse   Select Nutrition   Nutrition Other   Instruction Review Code 1- Verbalizes Understanding   Class Start Time 1400   Class Stop Time 1445   Class Time Calculation (min) 45 min    Row Name 04/25/24 1500     Education   Cardiac Education Topics Pritikin   Customer service manager   Weekly Topic Personalizing Your Pritikin Plate   Instruction Review Code 1- Verbalizes Understanding   Class Start Time 1400   Class Stop Time 1445   Class Time Calculation (min) 45 min    Row Name 04/27/24 1500     Education   Cardiac Education Topics Pritikin  NB   Select Core Videos     Core Videos   Educator Dietitian   Select Nutrition   Nutrition Overview of the Pritikin Eating Plan   Instruction Review Code 1- [employer] Understanding    Row Name 05/02/24 1400     Education   Cardiac Education Topics Pritikin   Orthoptist   Educator Dietitian   Weekly Topic [employer] Desserts   Instruction Review Code 1- Verbalizes Understanding   Class Start Time 1400   Class Stop Time 1450    Class Time Calculation (min) 50 min    Row Name 05/04/24 1500     Education   Cardiac Education Topics Pritikin   Licensed conveyancer Nutrition   Nutrition Calorie Density   Instruction Review Code 1- Verbalizes Understanding   Class Start Time 1400   Class Stop Time 1435   Class Time Calculation (min) 35 min    Row Name 05/09/24 1400     Education   Cardiac Education Topics Pritikin   Customer service manager   Weekly Topic Efficiency Cooking - Meals in a Snap   Instruction Review Code 1- Verbalizes Understanding   Class Start Time 1400   Class Stop Time 1440   Class Time Calculation (min) 40 min    Row Name 05/11/24 1400     Education   Cardiac Education Topics Pritikin   Psychologist, sport and exercise  Core Videos   Educator Exercise Clinical cytogeneticist Exercise Education   Exercise Education Move It!   Instruction Review Code 1- Verbalizes Understanding   Class Start Time 1359   Class Stop Time 1432   Class Time Calculation (min) 33 min    Row Name 05/16/24 1400     Education   Cardiac Education Topics Pritikin   Customer service manager   Weekly Topic One-Pot Wonders   Instruction Review Code 1- Verbalizes Understanding   Class Start Time 1400   Class Stop Time 1440   Class Time Calculation (min) 40 min    Row Name 05/18/24 1500     Education   Cardiac Education Topics Pritikin   Writer General Education   General Education Hypertension and Heart Disease   Instruction Review Code 1- Verbalizes Understanding   Class Start Time 1403   Class Stop Time 1440   Class Time Calculation (min) 37 min    Row Name 05/21/24 1400     Education   Cardiac Education Topics Pritikin   Geographical information systems officer Psychosocial   Psychosocial  Workshop Focused Goals, Sustainable Changes   Instruction Review Code 1- Verbalizes Understanding   Class Start Time 1359   Class Stop Time 1434   Class Time Calculation (min) 35 min      Core Videos: Exercise    Move It!  Clinical staff conducted group or individual video education with verbal and written material and guidebook.  Patient learns the recommended Pritikin exercise program. Exercise with the goal of living a long, healthy life. Some of the health benefits of exercise include controlled diabetes, healthier blood pressure levels, improved cholesterol levels, improved heart and lung capacity, improved sleep, and better body composition. Everyone should speak with their doctor before starting or changing an exercise routine.  Biomechanical Limitations Clinical staff conducted group or individual video education with verbal and written material and guidebook.  Patient learns how biomechanical limitations can impact exercise and how we can mitigate and possibly overcome limitations to have an impactful and balanced exercise routine.  Body Composition Clinical staff conducted group or individual video education with verbal and written material and guidebook.  Patient learns that body composition (ratio of muscle mass to fat mass) is a key component to assessing overall fitness, rather than body weight alone. Increased fat mass, especially visceral belly fat, can put us  at increased risk for metabolic syndrome, type 2 diabetes, heart disease, and even death. It is recommended to combine diet and exercise (cardiovascular and resistance training) to improve your body composition. Seek guidance from your physician and exercise physiologist before implementing an exercise routine.  Exercise Action Plan Clinical staff conducted group or individual video education with verbal and written material and guidebook.  Patient learns the recommended strategies to achieve and enjoy long-term exercise  adherence, including variety, self-motivation, self-efficacy, and positive decision making. Benefits of exercise include fitness, good health, weight management, more energy, better sleep, less stress, and overall well-being.  Medical   Heart Disease Risk Reduction Clinical staff conducted group or individual video education with verbal and written material and guidebook.  Patient learns our heart is our most vital organ as it circulates oxygen, nutrients, white blood cells, and hormones throughout the entire body, and carries waste away.  Data supports a plant-based eating plan like the Pritikin Program for its effectiveness in slowing progression of and reversing heart disease. The video provides a number of recommendations to address heart disease.   Metabolic Syndrome and Belly Fat  Clinical staff conducted group or individual video education with verbal and written material and guidebook.  Patient learns what metabolic syndrome is, how it leads to heart disease, and how one can reverse it and keep it from coming back. You have metabolic syndrome if you have 3 of the following 5 criteria: abdominal obesity, high blood pressure, high triglycerides, low HDL cholesterol, and high blood sugar.  Hypertension and Heart Disease Clinical staff conducted group or individual video education with verbal and written material and guidebook.  Patient learns that high blood pressure, or hypertension, is very common in the [place] . Hypertension is largely due to excessive salt intake, but other important risk factors include being overweight, physical inactivity, drinking too much alcohol, smoking, and not eating enough potassium from fruits and vegetables. High blood pressure is a leading risk factor for heart attack, stroke, congestive heart failure, dementia, kidney failure, and premature death. Long-term effects of excessive salt intake include stiffening of the arteries and thickening of heart muscle and  organ damage. Recommendations include ways to reduce hypertension and the risk of heart disease.  Diseases of Our Time - Focusing on Diabetes Clinical staff conducted group or individual video education with verbal and written material and guidebook.  Patient learns why the best way to stop diseases of our time is prevention, through food and other lifestyle changes. Medicine (such as prescription pills and surgeries) is often only a Band-Aid on the problem, not a long-term solution. Most common diseases of our time include obesity, type 2 diabetes, hypertension, heart disease, and cancer. The Pritikin Program is recommended and has been proven to help reduce, reverse, and/or prevent the damaging effects of metabolic syndrome.  Nutrition   Overview of the Pritikin Eating Plan  Clinical staff conducted group or individual video education with verbal and written material and guidebook.  Patient learns about the Pritikin Eating Plan for disease risk reduction. The Pritikin Eating Plan emphasizes a wide variety of unrefined, minimally-processed carbohydrates, like fruits, vegetables, whole grains, and legumes. Go, Caution, and Stop food choices are explained. Plant-based and lean animal proteins are emphasized. Rationale provided for low sodium intake for blood pressure control, low added sugars for blood sugar stabilization, and low added fats and oils for coronary artery disease risk reduction and weight management.  Calorie Density  Clinical staff conducted group or individual video education with verbal and written material and guidebook.  Patient learns about calorie density and how it impacts the Pritikin Eating Plan. Knowing the characteristics of the food you choose will help you decide whether those foods will lead to weight gain or weight loss, and whether you want to consume more or less of them. Weight loss is usually a side effect of the Pritikin Eating Plan because of its focus on low  calorie-dense foods.  Label Reading  Clinical staff conducted group or individual video education with verbal and written material and guidebook.  Patient learns about the Pritikin recommended label reading guidelines and corresponding recommendations regarding calorie density, added sugars, sodium content, and whole grains.  Dining Out - Part 1  Clinical staff conducted group or individual video education with verbal and written material and guidebook.  Patient learns that restaurant meals can be sabotaging because they can be so high  in calories, fat, sodium, and/or sugar. Patient learns recommended strategies on how to positively address this and avoid unhealthy pitfalls.  Facts on Fats  Clinical staff conducted group or individual video education with verbal and written material and guidebook.  Patient learns that lifestyle modifications can be just as effective, if not more so, as many medications for lowering your risk of heart disease. A Pritikin lifestyle can help to reduce your risk of inflammation and atherosclerosis (cholesterol build-up, or plaque, in the artery walls). Lifestyle interventions such as dietary choices and physical activity address the cause of atherosclerosis. A review of the types of fats and their impact on blood cholesterol levels, along with dietary recommendations to reduce fat intake is also included.  Nutrition Action Plan  Clinical staff conducted group or individual video education with verbal and written material and guidebook.  Patient learns how to incorporate Pritikin recommendations into their lifestyle. Recommendations include planning and keeping personal health goals in mind as an important part of their success.  Healthy Mind-Set    Healthy Minds, Bodies, Hearts  Clinical staff conducted group or individual video education with verbal and written material and guidebook.  Patient learns how to identify when they are stressed. Video will discuss the  impact of that stress, as well as the many benefits of stress management. Patient will also be introduced to stress management techniques. The way we think, act, and feel has an impact on our hearts.  How Our Thoughts Can Heal Our Hearts  Clinical staff conducted group or individual video education with verbal and written material and guidebook.  Patient learns that negative thoughts can cause depression and anxiety. This can result in negative lifestyle behavior and serious health problems. Cognitive behavioral therapy is an effective method to help control our thoughts in order to change and improve our emotional outlook.  Additional Videos:  Exercise    Improving Performance  Clinical staff conducted group or individual video education with verbal and written material and guidebook.  Patient learns to use a non-linear approach by alternating intensity levels and lengths of time spent exercising to help burn more calories and lose more body fat. Cardiovascular exercise helps improve heart health, metabolism, hormonal balance, blood sugar control, and recovery from fatigue. Resistance training improves strength, endurance, balance, coordination, reaction time, metabolism, and muscle mass. Flexibility exercise improves circulation, posture, and balance. Seek guidance from your physician and exercise physiologist before implementing an exercise routine and learn your capabilities and proper form for all exercise.  Introduction to Yoga  Clinical staff conducted group or individual video education with verbal and written material and guidebook.  Patient learns about yoga, a discipline of the coming together of mind, breath, and body. The benefits of yoga include improved flexibility, improved range of motion, better posture and core strength, increased lung function, weight loss, and positive self-image. Yoga's heart health benefits include lowered blood pressure, healthier heart rate, decreased  cholesterol and triglyceride levels, improved immune function, and reduced stress. Seek guidance from your physician and exercise physiologist before implementing an exercise routine and learn your capabilities and proper form for all exercise.  Medical   Aging: Enhancing Your Quality of Life  Clinical staff conducted group or individual video education with verbal and written material and guidebook.  Patient learns key strategies and recommendations to stay in good physical health and enhance quality of life, such as prevention strategies, having an advocate, securing a Health Care Proxy and Power of Attorney, and keeping a list of  medications and system for tracking them. It also discusses how to avoid risk for bone loss.  Biology of Weight Control  Clinical staff conducted group or individual video education with verbal and written material and guidebook.  Patient learns that weight gain occurs because we consume more calories than we burn (eating more, moving less). Even if your body weight is normal, you may have higher ratios of fat compared to muscle mass. Too much body fat puts you at increased risk for cardiovascular disease, heart attack, stroke, type 2 diabetes, and obesity-related cancers. In addition to exercise, following the Pritikin Eating Plan can help reduce your risk.  Decoding Lab Results  Clinical staff conducted group or individual video education with verbal and written material and guidebook.  Patient learns that lab test reflects one measurement whose values change over time and are influenced by many factors, including medication, stress, sleep, exercise, food, hydration, pre-existing medical conditions, and more. It is recommended to use the knowledge from this video to become more involved with your lab results and evaluate your numbers to speak with your doctor.   Diseases of Our Time - Overview  Clinical staff conducted group or individual video education with verbal  and written material and guidebook.  Patient learns that according to the CDC, 50% to 70% of chronic diseases (such as obesity, type 2 diabetes, elevated lipids, hypertension, and heart disease) are avoidable through lifestyle improvements including healthier food choices, listening to satiety cues, and increased physical activity.  Sleep Disorders Clinical staff conducted group or individual video education with verbal and written material and guidebook.  Patient learns how good quality and duration of sleep are important to overall health and well-being. Patient also learns about sleep disorders and how they impact health along with recommendations to address them, including discussing with a physician.  Nutrition  Dining Out - Part 2 Clinical staff conducted group or individual video education with verbal and written material and guidebook.  Patient learns how to plan ahead and communicate in order to maximize their dining experience in a healthy and nutritious manner. Included are recommended food choices based on the type of restaurant the patient is visiting.   Fueling a Banker conducted group or individual video education with verbal and written material and guidebook.  There is a strong connection between our food choices and our health. Diseases like obesity and type 2 diabetes are very prevalent and are in large-part due to lifestyle choices. The Pritikin Eating Plan provides plenty of food and hunger-curbing satisfaction. It is easy to follow, affordable, and helps reduce health risks.  Menu Workshop  Clinical staff conducted group or individual video education with verbal and written material and guidebook.  Patient learns that restaurant meals can sabotage health goals because they are often packed with calories, fat, sodium, and sugar. Recommendations include strategies to plan ahead and to communicate with the manager, chef, or server to help order a healthier  meal.  Planning Your Eating Strategy  Clinical staff conducted group or individual video education with verbal and written material and guidebook.  Patient learns about the Pritikin Eating Plan and its benefit of reducing the risk of disease. The Pritikin Eating Plan does not focus on calories. Instead, it emphasizes high-quality, nutrient-rich foods. By knowing the characteristics of the foods, we choose, we can determine their calorie density and make informed decisions.  Targeting Your Nutrition Priorities  Clinical staff conducted group or individual video education with verbal and written  material and guidebook.  Patient learns that lifestyle habits have a tremendous impact on disease risk and progression. This video provides eating and physical activity recommendations based on your personal health goals, such as reducing LDL cholesterol, losing weight, preventing or controlling type 2 diabetes, and reducing high blood pressure.  Vitamins and Minerals  Clinical staff conducted group or individual video education with verbal and written material and guidebook.  Patient learns different ways to obtain key vitamins and minerals, including through a recommended healthy diet. It is important to discuss all supplements you take with your doctor.   Healthy Mind-Set    Smoking Cessation  Clinical staff conducted group or individual video education with verbal and written material and guidebook.  Patient learns that cigarette smoking and tobacco addiction pose a serious health risk which affects millions of people. Stopping smoking will significantly reduce the risk of heart disease, lung disease, and many forms of cancer. Recommended strategies for quitting are covered, including working with your doctor to develop a successful plan.  Culinary   Becoming a Set designer conducted group or individual video education with verbal and written material and guidebook.  Patient learns  that cooking at home can be healthy, cost-effective, quick, and puts them in control. Keys to cooking healthy recipes will include looking at your recipe, assessing your equipment needs, planning ahead, making it simple, choosing cost-effective seasonal ingredients, and limiting the use of added fats, salts, and sugars.  Cooking - Breakfast and Snacks  Clinical staff conducted group or individual video education with verbal and written material and guidebook.  Patient learns how important breakfast is to satiety and nutrition through the entire day. Recommendations include key foods to eat during breakfast to help stabilize blood sugar levels and to prevent overeating at meals later in the day. Planning ahead is also a key component.  Cooking - Educational psychologist conducted group or individual video education with verbal and written material and guidebook.  Patient learns eating strategies to improve overall health, including an approach to cook more at home. Recommendations include thinking of animal protein as a side on your plate rather than center stage and focusing instead on lower calorie dense options like vegetables, fruits, whole grains, and plant-based proteins, such as beans. Making sauces in large quantities to freeze for later and leaving the skin on your vegetables are also recommended to maximize your experience.  Cooking - Healthy Salads and Dressing Clinical staff conducted group or individual video education with verbal and written material and guidebook.  Patient learns that vegetables, fruits, whole grains, and legumes are the foundations of the Pritikin Eating Plan. Recommendations include how to incorporate each of these in flavorful and healthy salads, and how to create homemade salad dressings. Proper handling of ingredients is also covered. Cooking - Soups and [employer] - Soups and Desserts Clinical staff conducted group or individual video education with  verbal and written material and guidebook.  Patient learns that Pritikin soups and desserts make for easy, nutritious, and delicious snacks and meal components that are low in sodium, fat, sugar, and calorie density, while high in vitamins, minerals, and filling fiber. Recommendations include simple and healthy ideas for soups and desserts.   Overview     The Pritikin Solution Program Overview Clinical staff conducted group or individual video education with verbal and written material and guidebook.  Patient learns that the results of the Pritikin Program have been documented in more than  100 articles published in peer-reviewed journals, and the benefits include reducing risk factors for (and, in some cases, even reversing) high cholesterol, high blood pressure, type 2 diabetes, obesity, and more! An overview of the three key pillars of the Pritikin Program will be covered: eating well, doing regular exercise, and having a healthy mind-set.  WORKSHOPS  Exercise: Exercise Basics: Building Your Action Plan Clinical staff led group instruction and group discussion with PowerPoint presentation and patient guidebook. To enhance the learning environment the use of posters, models and videos may be added. At the conclusion of this workshop, patients will comprehend the difference between physical activity and exercise, as well as the benefits of incorporating both, into their routine. Patients will understand the FITT (Frequency, Intensity, Time, and Type) principle and how to use it to build an exercise action plan. In addition, safety concerns and other considerations for exercise and cardiac rehab will be addressed by the presenter. The purpose of this lesson is to promote a comprehensive and effective weekly exercise routine in order to improve patients' overall level of fitness.   Managing Heart Disease: Your Path to a Healthier Heart Clinical staff led group instruction and group discussion with  PowerPoint presentation and patient guidebook. To enhance the learning environment the use of posters, models and videos may be added.At the conclusion of this workshop, patients will understand the anatomy and physiology of the heart. Additionally, they will understand how Pritikin's three pillars impact the risk factors, the progression, and the management of heart disease.  The purpose of this lesson is to provide a high-level overview of the heart, heart disease, and how the Pritikin lifestyle positively impacts risk factors.  Exercise Biomechanics Clinical staff led group instruction and group discussion with PowerPoint presentation and patient guidebook. To enhance the learning environment the use of posters, models and videos may be added. Patients will learn how the structural parts of their bodies function and how these functions impact their daily activities, movement, and exercise. Patients will learn how to promote a neutral spine, learn how to manage pain, and identify ways to improve their physical movement in order to promote healthy living. The purpose of this lesson is to expose patients to common physical limitations that impact physical activity. Participants will learn practical ways to adapt and manage aches and pains, and to minimize their effect on regular exercise. Patients will learn how to maintain good posture while sitting, walking, and lifting.  Balance Training and Fall Prevention  Clinical staff led group instruction and group discussion with PowerPoint presentation and patient guidebook. To enhance the learning environment the use of posters, models and videos may be added. At the conclusion of this workshop, patients will understand the importance of their sensorimotor skills (vision, proprioception, and the vestibular system) in maintaining their ability to balance as they age. Patients will apply a variety of balancing exercises that are appropriate for their  current level of function. Patients will understand the common causes for poor balance, possible solutions to these problems, and ways to modify their physical environment in order to minimize their fall risk. The purpose of this lesson is to teach patients about the importance of maintaining balance as they age and ways to minimize their risk of falling.  WORKSHOPS   Nutrition:  Fueling a Ship broker led group instruction and group discussion with PowerPoint presentation and patient guidebook. To enhance the learning environment the use of posters, models and videos may be added. Patients will review the  foundational principles of the Pritikin Eating Plan and understand what constitutes a serving size in each of the food groups. Patients will also learn Pritikin-friendly foods that are better choices when away from home and review make-ahead meal and snack options. Calorie density will be reviewed and applied to three nutrition priorities: weight maintenance, weight loss, and weight gain. The purpose of this lesson is to reinforce (in a group setting) the key concepts around what patients are recommended to eat and how to apply these guidelines when away from home by planning and selecting Pritikin-friendly options. Patients will understand how calorie density may be adjusted for different weight management goals.  Mindful Eating  Clinical staff led group instruction and group discussion with PowerPoint presentation and patient guidebook. To enhance the learning environment the use of posters, models and videos may be added. Patients will briefly review the concepts of the Pritikin Eating Plan and the importance of low-calorie dense foods. The concept of mindful eating will be introduced as well as the importance of paying attention to internal hunger signals. Triggers for non-hunger eating and techniques for dealing with triggers will be explored. The purpose of this lesson is to provide  patients with the opportunity to review the basic principles of the Pritikin Eating Plan, discuss the value of eating mindfully and how to measure internal cues of hunger and fullness using the Hunger Scale. Patients will also discuss reasons for non-hunger eating and learn strategies to use for controlling emotional eating.  Targeting Your Nutrition Priorities Clinical staff led group instruction and group discussion with PowerPoint presentation and patient guidebook. To enhance the learning environment the use of posters, models and videos may be added. Patients will learn how to determine their genetic susceptibility to disease by reviewing their family history. Patients will gain insight into the importance of diet as part of an overall healthy lifestyle in mitigating the impact of genetics and other environmental insults. The purpose of this lesson is to provide patients with the opportunity to assess their personal nutrition priorities by looking at their family history, their own health history and current risk factors. Patients will also be able to discuss ways of prioritizing and modifying the Pritikin Eating Plan for their highest risk areas  Menu  Clinical staff led group instruction and group discussion with PowerPoint presentation and patient guidebook. To enhance the learning environment the use of posters, models and videos may be added. Using menus brought in from [employer], or printed from [employer], patients will apply the Pritikin dining out guidelines that were presented in the [employer] video. Patients will also be able to practice these guidelines in a variety of provided scenarios. The purpose of this lesson is to provide patients with the opportunity to practice hands-on learning of the Pritikin Dining Out guidelines with actual menus and practice scenarios.  Label Reading Clinical staff led group instruction and group discussion with PowerPoint  presentation and patient guidebook. To enhance the learning environment the use of posters, models and videos may be added. Patients will review and discuss the Pritikin label reading guidelines presented in Pritikin's Label Reading Educational series video. Using fool labels brought in from local grocery stores and markets, patients will apply the label reading guidelines and determine if the packaged food meet the Pritikin guidelines. The purpose of this lesson is to provide patients with the opportunity to review, discuss, and practice hands-on learning of the Pritikin Label Reading guidelines with actual packaged food labels. Cooking School  Pritikin's [employer] are designed to teach patients ways to prepare quick, simple, and affordable recipes at home. The importance of nutrition's role in chronic disease risk reduction is reflected in its emphasis in the overall Pritikin program. By learning how to prepare essential core Pritikin Eating Plan recipes, patients will increase control over what they eat; be able to customize the flavor of foods without the use of added salt, sugar, or fat; and improve the quality of the food they consume. By learning a set of core recipes which are easily assembled, quickly prepared, and affordable, patients are more likely to prepare more healthy foods at home. These workshops focus on convenient breakfasts, simple entres, side dishes, and desserts which can be prepared with minimal effort and are consistent with nutrition recommendations for cardiovascular risk reduction. Cooking [employer] are taught by a Armed forces logistics/support/administrative officer (RD) who has been trained by the [employer]. The chef or RD has a clear understanding of the importance of minimizing - if not completely eliminating - added fat, sugar, and sodium in recipes. Throughout the series of Cooking School Workshop sessions, patients will learn about healthy ingredients and  efficient methods of cooking to build confidence in their capability to prepare    Cooking School weekly topics:  Adding Flavor- Sodium-Free  Fast and Healthy Breakfasts  Powerhouse Plant-Based Proteins  Satisfying Salads and Dressings  Simple Sides and Sauces  International Cuisine-Spotlight on the [employer] Zones  Delicious Desserts  Savory Soups  [employer] - Meals in a Astronomer Appetizers and Snacks  Comforting Weekend Breakfasts  One-Pot Wonders   Fast Evening Meals  Landscape architect Your Pritikin Plate  WORKSHOPS   Healthy Mindset (Psychosocial):  Focused Goals, Sustainable Changes Clinical staff led group instruction and group discussion with PowerPoint presentation and patient guidebook. To enhance the learning environment the use of posters, models and videos may be added. Patients will be able to apply effective goal setting strategies to establish at least one personal goal, and then take consistent, meaningful action toward that goal. They will learn to identify common barriers to achieving personal goals and develop strategies to overcome them. Patients will also gain an understanding of how our mind-set can impact our ability to achieve goals and the importance of cultivating a positive and growth-oriented mind-set. The purpose of this lesson is to provide patients with a deeper understanding of how to set and achieve personal goals, as well as the tools and strategies needed to overcome common obstacles which may arise along the way.  From Head to Heart: The Power of a Healthy Outlook  Clinical staff led group instruction and group discussion with PowerPoint presentation and patient guidebook. To enhance the learning environment the use of posters, models and videos may be added. Patients will be able to recognize and describe the impact of emotions and mood on physical health. They will discover the importance of self-care and explore self-care  practices which may work for them. Patients will also learn how to utilize the 4 C's to cultivate a healthier outlook and better manage stress and challenges. The purpose of this lesson is to demonstrate to patients how a healthy outlook is an essential part of maintaining good health, especially as they continue their cardiac rehab journey.  Healthy Sleep for a Healthy Heart Clinical staff led group instruction and group discussion with PowerPoint presentation and patient guidebook. To enhance the learning environment the use of posters, models and videos  may be added. At the conclusion of this workshop, patients will be able to demonstrate knowledge of the importance of sleep to overall health, well-being, and quality of life. They will understand the symptoms of, and treatments for, common sleep disorders. Patients will also be able to identify daytime and nighttime behaviors which impact sleep, and they will be able to apply these tools to help manage sleep-related challenges. The purpose of this lesson is to provide patients with a general overview of sleep and outline the importance of quality sleep. Patients will learn about a few of the most common sleep disorders. Patients will also be introduced to the concept of "sleep hygiene," and discover ways to self-manage certain sleeping problems through simple daily behavior changes. Finally, the workshop will motivate patients by clarifying the links between quality sleep and their goals of heart-healthy living.   Recognizing and Reducing Stress Clinical staff led group instruction and group discussion with PowerPoint presentation and patient guidebook. To enhance the learning environment the use of posters, models and videos may be added. At the conclusion of this workshop, patients will be able to understand the types of stress reactions, differentiate between acute and chronic stress, and recognize the impact that chronic stress has on their health. They  will also be able to apply different coping mechanisms, such as reframing negative self-talk. Patients will have the opportunity to practice a variety of stress management techniques, such as deep abdominal breathing, progressive muscle relaxation, and/or guided imagery.  The purpose of this lesson is to educate patients on the role of stress in their lives and to provide healthy techniques for coping with it.  Learning Barriers/Preferences:  Learning Barriers/Preferences - 03/06/24 1340       Learning Barriers/Preferences   Learning Barriers Sight   wears glasses   Learning Preferences Video;Written Material;Pictoral;Computer/Internet          Education Topics:  Knowledge Questionnaire Score:  Knowledge Questionnaire Score - 03/06/24 1543       Knowledge Questionnaire Score   Pre Score 19/24          Core Components/Risk Factors/Patient Goals at Admission:  Personal Goals and Risk Factors at Admission - 03/06/24 1401       Core Components/Risk Factors/Patient Goals on Admission    Weight Management Weight Maintenance    Diabetes Yes    Intervention Provide education about signs/symptoms and action to take for hypo/hyperglycemia.;Provide education about proper nutrition, including hydration, and aerobic/resistive exercise prescription along with prescribed medications to achieve blood glucose in normal ranges: Fasting glucose 65-99 mg/dL    Expected Outcomes Short Term: Participant verbalizes understanding of the signs/symptoms and immediate care of hyper/hypoglycemia, proper foot care and importance of medication, aerobic/resistive exercise and nutrition plan for blood glucose control.;Long Term: Attainment of HbA1C < 7%.    Hypertension Yes    Intervention Provide education on lifestyle modifcations including regular physical activity/exercise, weight management, moderate sodium restriction and increased consumption of fresh fruit, vegetables, and low fat dairy, alcohol  moderation, and smoking cessation.;Monitor prescription use compliance.    Expected Outcomes Short Term: Continued assessment and intervention until BP is < 140/81mm HG in hypertensive participants. < 130/52mm HG in hypertensive participants with diabetes, heart failure or chronic kidney disease.;Long Term: Maintenance of blood pressure at goal levels.    Lipids Yes    Intervention Provide education and support for participant on nutrition & aerobic/resistive exercise along with prescribed medications to achieve LDL 70mg , HDL >40mg .    Expected Outcomes Short  Term: Participant states understanding of desired cholesterol values and is compliant with medications prescribed. Participant is following exercise prescription and nutrition guidelines.;Long Term: Cholesterol controlled with medications as prescribed, with individualized exercise RX and with personalized nutrition plan. Value goals: LDL < 70mg , HDL > 40 mg.    Stress Yes    Intervention Offer individual and/or small group education and counseling on adjustment to heart disease, stress management and health-related lifestyle change. Teach and support self-help strategies.;Refer participants experiencing significant psychosocial distress to appropriate mental health specialists for further evaluation and treatment. When possible, include family members and significant others in education/counseling sessions.    Expected Outcomes Short Term: Participant demonstrates changes in health-related behavior, relaxation and other stress management skills, ability to obtain effective social support, and compliance with psychotropic medications if prescribed.;Long Term: Emotional wellbeing is indicated by absence of clinically significant psychosocial distress or social isolation.          Core Components/Risk Factors/Patient Goals Review:   Goals and Risk Factor Review     Row Name 03/12/24 1740 03/27/24 1525 04/23/24 1808 05/22/24 1445       Core  Components/Risk Factors/Patient Goals Review   Personal Goals Review Weight Management/Obesity;Lipids;Stress;Hypertension;Diabetes Weight Management/Obesity;Lipids;Stress;Hypertension;Diabetes Weight Management/Obesity;Lipids;Stress;Hypertension;Diabetes Weight Management/Obesity;Lipids;Stress;Hypertension;Diabetes    Review Calyssa started cardiac rehab on 03/12/24. Dezerae did well with exercise. Vital signs and CBG's were stable. Shirel is off to a good start  with exercise at cardiac rehab. . Vital signs and CBG's have been stable. Chessa is doing well  with exercise at cardiac rehab. . Vital signs have been stable. Hazely continues to do well  with exercise at cardiac rehab. . Vital signs remain stable. Athanasia has lost 2.5 kg since starting cardiac rehab    Expected Outcomes Beena will continue to particiapate in cardiac rehab for exercise, nutrition and lifestyle modifications. Chamaine will continue to particiapate in cardiac rehab for exercise, nutrition and lifestyle modifications. Milee will continue to particiapate in cardiac rehab for exercise, nutrition and lifestyle modifications. Jaely will continue to particiapate in cardiac rehab for exercise, nutrition and lifestyle modifications.       Core Components/Risk Factors/Patient Goals at Discharge (Final Review):   Goals and Risk Factor Review - 05/22/24 1445       Core Components/Risk Factors/Patient Goals Review   Personal Goals Review Weight Management/Obesity;Lipids;Stress;Hypertension;Diabetes    Review Serenna continues to do well  with exercise at cardiac rehab. . Vital signs remain stable. Zaide has lost 2.5 kg since starting cardiac rehab    Expected Outcomes Margurite will continue to particiapate in cardiac rehab for exercise, nutrition and lifestyle modifications.          ITP Comments:  ITP Comments     Row Name 03/06/24 1322 03/12/24 1735 03/27/24 1514 04/23/24 1805 05/22/24 1438    ITP Comments Wilbert Bihari, MD: Medical Director. Introduction to the [employer] / Intensive Cardiac Rehab. Initial orienation packet reviewed with the patient. 30 Day ITP Review. Allyson started cardiac rehab on 03/12/24. Trica did well with exercise. 30 Day ITP Review. Pascale is off to a good start with exercise at cardiac rehab 30 Day ITP Review. Nyjah has good particpation with exercise at cardiac rehab when in attendance. 30 Day ITP Review. Clodagh continues to have good particpation with exercise at cardiac rehab. Neilani will complete cardiac rehab on 05/28/24.      Comments: See ITP comments.[name] RN BSN

## 2024-05-23 ENCOUNTER — Encounter (HOSPITAL_COMMUNITY)
Admission: RE | Admit: 2024-05-23 | Discharge: 2024-05-23 | Disposition: A | Discharge status: Home / Self Care | Source: Ambulatory Visit | Attending: Cardiology

## 2024-05-23 DIAGNOSIS — I214 Non-ST elevation (NSTEMI) myocardial infarction: Secondary | ICD-10-CM | POA: Diagnosis not present

## 2024-05-25 ENCOUNTER — Encounter: Payer: Self-pay | Admitting: Family Medicine

## 2024-05-25 ENCOUNTER — Encounter (HOSPITAL_COMMUNITY)
Admission: RE | Admit: 2024-05-25 | Discharge: 2024-05-25 | Disposition: A | Discharge status: Home / Self Care | Source: Ambulatory Visit | Attending: Cardiology

## 2024-05-25 DIAGNOSIS — I214 Non-ST elevation (NSTEMI) myocardial infarction: Secondary | ICD-10-CM | POA: Diagnosis not present

## 2024-05-25 DIAGNOSIS — Z0000 Encounter for general adult medical examination without abnormal findings: Secondary | ICD-10-CM

## 2024-05-28 ENCOUNTER — Encounter (HOSPITAL_COMMUNITY)
Admission: RE | Admit: 2024-05-28 | Discharge: 2024-05-28 | Disposition: A | Discharge status: Home / Self Care | Source: Ambulatory Visit | Attending: Cardiology | Admitting: Cardiology

## 2024-05-28 DIAGNOSIS — I214 Non-ST elevation (NSTEMI) myocardial infarction: Secondary | ICD-10-CM | POA: Diagnosis not present

## 2024-05-30 ENCOUNTER — Encounter (HOSPITAL_COMMUNITY): Admission: RE | Admit: 2024-05-30 | Source: Ambulatory Visit

## 2024-05-30 ENCOUNTER — Telehealth (HOSPITAL_COMMUNITY): Payer: Self-pay

## 2024-05-30 NOTE — Telephone Encounter (Signed)
 Patient c/o for 1:45 CR class at 1:57pm, states she is having car trouble.

## 2024-06-01 ENCOUNTER — Encounter (HOSPITAL_COMMUNITY)
Admission: RE | Admit: 2024-06-01 | Discharge: 2024-06-01 | Disposition: A | Discharge status: Home / Self Care | Source: Ambulatory Visit | Attending: Cardiology | Admitting: Cardiology

## 2024-06-01 DIAGNOSIS — I214 Non-ST elevation (NSTEMI) myocardial infarction: Secondary | ICD-10-CM | POA: Diagnosis not present

## 2024-06-04 ENCOUNTER — Encounter (HOSPITAL_COMMUNITY)
Admission: RE | Admit: 2024-06-04 | Discharge: 2024-06-04 | Disposition: A | Discharge status: Home / Self Care | Source: Ambulatory Visit | Attending: Cardiology | Admitting: Cardiology

## 2024-06-04 VITALS — Ht 63.0 in | Wt 177.7 lb

## 2024-06-04 DIAGNOSIS — I214 Non-ST elevation (NSTEMI) myocardial infarction: Secondary | ICD-10-CM | POA: Diagnosis not present

## 2024-06-05 ENCOUNTER — Encounter: Payer: Self-pay | Admitting: Neurology

## 2024-06-06 ENCOUNTER — Encounter (HOSPITAL_COMMUNITY)
Admission: RE | Admit: 2024-06-06 | Discharge: 2024-06-06 | Disposition: A | Discharge status: Home / Self Care | Source: Ambulatory Visit | Attending: Cardiology | Admitting: Cardiology

## 2024-06-06 DIAGNOSIS — I214 Non-ST elevation (NSTEMI) myocardial infarction: Secondary | ICD-10-CM

## 2024-06-06 NOTE — Progress Notes (Signed)
 Discharge Progress Report  Patient Details  Name: SHIANNA BALLY MRN: [id number] Date of Birth: [birth year] Referring Provider:   Flowsheet Row INTENSIVE CARDIAC REHAB ORIENT from 03/06/2024 in [facility]  Referring Provider Wilbert Bihari, MD     Number of Visits: 33  Reason for Discharge:  Patient reached a stable level of exercise. Patient independent in their exercise. Patient has met program and personal goals.  Smoking History:  Social History   Tobacco Use  Smoking Status Never  Smokeless Tobacco Never    Diagnosis:  12/29/23 NSTEMI (non-ST elevated myocardial infarction) (HCC)  ADL UCSD:   Initial Exercise Prescription:  Initial Exercise Prescription - 03/06/24 1500       Date of Initial Exercise RX and Referring Provider   Date 03/06/24    Referring Provider Wilbert Bihari, MD    Expected Discharge Date 05/30/24      NuStep   Level 2    SPM 75    Minutes 30    METs 2.6      Prescription Details   Frequency (times per week) 3    Duration Progress to 30 minutes of continuous aerobic without signs/symptoms of physical distress      Intensity   THRR 40-80% of Max Heartrate 60-120    Ratings of Perceived Exertion 11-13    Perceived Dyspnea 0-4      Progression   Progression Continue progressive overload as per policy without signs/symptoms or physical distress.      Resistance Training   Training Prescription Yes    Weight 3lbs    Reps 10-15          Discharge Exercise Prescription (Final Exercise Prescription Changes):  Exercise Prescription Changes - 06/08/24 1625       Response to Exercise   Blood Pressure (Admit) 142/78    Blood Pressure (Exit) 120/70    Heart Rate (Admit) 83 bpm    Heart Rate (Exercise) 118 bpm    Heart Rate (Exit) 92 bpm    Rating of Perceived Exertion (Exercise) 9    Perceived Dyspnea (Exercise) 0    Symptoms 0    Comments Pt graduated the [employer]  program    Duration Progress to 30 minutes of  aerobic without signs/symptoms of physical distress    Intensity THRR unchanged      Progression   Progression Continue to progress workloads to maintain intensity without signs/symptoms of physical distress.    Average METs 2.6      Resistance Training   Training Prescription Yes    Weight 3lbs    Reps 10-15    Time 10 Minutes      NuStep   Level 4    SPM 84    Minutes 15    METs 2.8      Track   Laps 11    Minutes 15    METs 2.4      Home Exercise Plan   Plans to continue exercise at Home (comment)    Frequency Add 3 additional days to program exercise sessions.    Initial Home Exercises Provided 05/02/24          Functional Capacity:  6 Minute Walk     Row Name 03/06/24 1534 06/04/24 1655       6 Minute Walk   Phase Initial Discharge    Distance 1308 feet 1430 feet    Distance % Change -- 9.33 %  Distance Feet Change -- 122 ft    Walk Time 6 minutes 6 minutes    # of Rest Breaks 0 0    MPH 2.5 2.71    METS 2.6 2.73    RPE 11 7    Perceived Dyspnea  1 0    VO2 Peak 9.03 9.54    Symptoms Yes (comment) No    Comments Mild SOB, RPD =1; low back pain 7/10, rt hip pain 4/10 --    Resting HR 64 bpm 81 bpm    Resting BP 130/78 112/68    Resting Oxygen Saturation  95 % --    Exercise Oxygen Saturation  during 6 min walk 98 % --    Max Ex. HR 95 bpm 94 bpm    Max Ex. BP 134/80 122/62       Psychological, QOL, Others - Outcomes: PHQ 2/9:    06/06/2024    3:06 PM 03/06/2024    1:55 PM  Depression screen PHQ 2/9  Decreased Interest 0 2  Down, Depressed, Hopeless 0 2  PHQ - 2 Score 0 4  Altered sleeping 0 0  Tired, decreased energy 0 1  Change in appetite 1 2  Feeling bad or failure about yourself  0 0  Trouble concentrating 0 0  Moving slowly or fidgety/restless 0 1  Suicidal thoughts 0 0  PHQ-9 Score 1 8  Difficult doing work/chores Somewhat difficult Somewhat difficult    Quality of Life:   Quality of Life - 06/04/24 1700       Quality of Life Scores   Health/Function Post 25.58 %    Socioeconomic Post 26.4 %    Psych/Spiritual Post 27.43 %    Family Post 25.5 %    GLOBAL Post 26.16 %          Personal Goals: Goals established at orientation with interventions provided to work toward goal.  Personal Goals and Risk Factors at Admission - 03/06/24 1401       Core Components/Risk Factors/Patient Goals on Admission    Weight Management Weight Maintenance    Diabetes Yes    Intervention Provide education about signs/symptoms and action to take for hypo/hyperglycemia.;Provide education about proper nutrition, including hydration, and aerobic/resistive exercise prescription along with prescribed medications to achieve blood glucose in normal ranges: Fasting glucose 65-99 mg/dL    Expected Outcomes Short Term: Participant verbalizes understanding of the signs/symptoms and immediate care of hyper/hypoglycemia, proper foot care and importance of medication, aerobic/resistive exercise and nutrition plan for blood glucose control.;Long Term: Attainment of HbA1C < 7%.    Hypertension Yes    Intervention Provide education on lifestyle modifcations including regular physical activity/exercise, weight management, moderate sodium restriction and increased consumption of fresh fruit, vegetables, and low fat dairy, alcohol moderation, and smoking cessation.;Monitor prescription use compliance.    Expected Outcomes Short Term: Continued assessment and intervention until BP is < 140/70mm HG in hypertensive participants. < 130/81mm HG in hypertensive participants with diabetes, heart failure or chronic kidney disease.;Long Term: Maintenance of blood pressure at goal levels.    Lipids Yes    Intervention Provide education and support for participant on nutrition & aerobic/resistive exercise along with prescribed medications to achieve LDL 70mg , HDL >40mg .    Expected Outcomes Short Term:  Participant states understanding of desired cholesterol values and is compliant with medications prescribed. Participant is following exercise prescription and nutrition guidelines.;Long Term: Cholesterol controlled with medications as prescribed, with individualized exercise RX and with personalized nutrition plan.  Value goals: LDL < 70mg , HDL > 40 mg.    Stress Yes    Intervention Offer individual and/or small group education and counseling on adjustment to heart disease, stress management and health-related lifestyle change. Teach and support self-help strategies.;Refer participants experiencing significant psychosocial distress to appropriate mental health specialists for further evaluation and treatment. When possible, include family members and significant others in education/counseling sessions.    Expected Outcomes Short Term: Participant demonstrates changes in health-related behavior, relaxation and other stress management skills, ability to obtain effective social support, and compliance with psychotropic medications if prescribed.;Long Term: Emotional wellbeing is indicated by absence of clinically significant psychosocial distress or social isolation.           Personal Goals Discharge:  Goals and Risk Factor Review     Row Name 03/12/24 1740 03/27/24 1525 04/23/24 1808 05/22/24 1445       Core Components/Risk Factors/Patient Goals Review   Personal Goals Review Weight Management/Obesity;Lipids;Stress;Hypertension;Diabetes Weight Management/Obesity;Lipids;Stress;Hypertension;Diabetes Weight Management/Obesity;Lipids;Stress;Hypertension;Diabetes Weight Management/Obesity;Lipids;Stress;Hypertension;Diabetes    Review Airyanna started cardiac rehab on 03/12/24. Mavis did well with exercise. Vital signs and CBG's were stable. Marsi is off to a good start  with exercise at cardiac rehab. . Vital signs and CBG's have been stable. Haelie is doing well  with exercise at cardiac  rehab. . Vital signs have been stable. Danise continues to do well  with exercise at cardiac rehab. . Vital signs remain stable. Deoni has lost 2.5 kg since starting cardiac rehab    Expected Outcomes Donja will continue to particiapate in cardiac rehab for exercise, nutrition and lifestyle modifications. Japneet will continue to particiapate in cardiac rehab for exercise, nutrition and lifestyle modifications. Maykayla will continue to particiapate in cardiac rehab for exercise, nutrition and lifestyle modifications. Maddeline will continue to particiapate in cardiac rehab for exercise, nutrition and lifestyle modifications.       Exercise Goals and Review:  Exercise Goals     Row Name 03/06/24 1339             Exercise Goals   Increase Physical Activity Yes       Intervention Provide advice, education, support and counseling about physical activity/exercise needs.;Develop an individualized exercise prescription for aerobic and resistive training based on initial evaluation findings, risk stratification, comorbidities and participant's personal goals.       Expected Outcomes Short Term: Attend rehab on a regular basis to increase amount of physical activity.;Long Term: Add in home exercise to make exercise part of routine and to increase amount of physical activity.;Long Term: Exercising regularly at least 3-5 days a week.       Increase Strength and Stamina Yes       Intervention Provide advice, education, support and counseling about physical activity/exercise needs.;Develop an individualized exercise prescription for aerobic and resistive training based on initial evaluation findings, risk stratification, comorbidities and participant's personal goals.       Expected Outcomes Short Term: Increase workloads from initial exercise prescription for resistance, speed, and METs.;Short Term: Perform resistance training exercises routinely during rehab and add in resistance training at  home;Long Term: Improve cardiorespiratory fitness, muscular endurance and strength as measured by increased METs and functional capacity ( )       Able to understand and use rate of perceived exertion (RPE) scale Yes       Intervention Provide education and explanation on how to use RPE scale       Expected Outcomes Short Term: Able to use RPE daily in rehab  to express subjective intensity level;Long Term:  Able to use RPE to guide intensity level when exercising independently       Knowledge and understanding of Target Heart Rate Range (THRR) Yes       Intervention Provide education and explanation of THRR including how the numbers were predicted and where they are located for reference       Expected Outcomes Short Term: Able to state/look up THRR;Short Term: Able to use daily as guideline for intensity in rehab;Long Term: Able to use THRR to govern intensity when exercising independently       Understanding of Exercise Prescription Yes       Intervention Provide education, explanation, and written materials on patient's individual exercise prescription       Expected Outcomes Short Term: Able to explain program exercise prescription;Long Term: Able to explain home exercise prescription to exercise independently          Exercise Goals Re-Evaluation:  Exercise Goals Re-Evaluation     Row Name 03/12/24 1641 04/16/24 1630 05/11/24 1700 06/08/24 1627       Exercise Goal Re-Evaluation   Exercise Goals Review Increase Physical Activity;Understanding of Exercise Prescription;Knowledge and understanding of Target Heart Rate Range (THRR);Increase Strength and Stamina;Able to understand and use rate of perceived exertion (RPE) scale Increase Physical Activity;Understanding of Exercise Prescription;Knowledge and understanding of Target Heart Rate Range (THRR);Increase Strength and Stamina;Able to understand and use rate of perceived exertion (RPE) scale Increase Physical Activity;Understanding of  Exercise Prescription;Knowledge and understanding of Target Heart Rate Range (THRR);Increase Strength and Stamina;Able to understand and use rate of perceived exertion (RPE) scale Increase Physical Activity;Understanding of Exercise Prescription;Knowledge and understanding of Target Heart Rate Range (THRR);Increase Strength and Stamina;Able to understand and use rate of perceived exertion (RPE) scale    Comments Pt first day in the [facility] I[facility] program. Pt tolerated exercise well with an average MET level of 2.2. She is off to a good start and is learning her THRR, RPE and ExRx Reviewed MET's and goals. Pt tolerated exercise well with an average MET level of 3.06. Pt is feeling good and progressing MET's and workloads. She is walking the track, this was a goal to be able to walk more and she is doing very well. She already feels an increase in strength and stamina Reviewed MET's and goals. Pt tolerated exercise well with an average MET level of 3.67. Pt is feeling good. Attempted the treadmill a little while ago, but liked the track better, so switched back. She is doing well. She is getting a quick pace on Nustep, encouraged WL increase next week if she feels able Pt graduated the [employer] program.  Pt tolerated exercise well with an average MET level of 2.6. She did well, MET's slightly lower today, but overall she did very well in the program. She increase on her post by 122 ft for a total of 1451ft she also decreased her %BF. She will exercise on her own by going to the senior center, weights and walking.    Expected Outcomes Will continue to monitor pt and progress workloads as tolerated without sign or symptom Will continue to monitor pt and progress workloads as tolerated without sign or symptom Will continue to monitor pt and progress workloads as tolerated without sign or symptom Will continue to monitor pt and progress workloads as tolerated without sign or symptom       Nutrition & Weight -  Outcomes:  Pre Biometrics - 03/06/24 1400  Pre Biometrics   Waist Circumference 39.5 inches    Hip Circumference 47 inches    Waist to Hip Ratio 0.84 %    Triceps Skinfold 24 mm    % Body Fat 42.4 %    Grip Strength 12 kg    Flexibility --   Not performed, active chronic low back pain 4/10   Single Leg Stand 2 seconds          Post Biometrics - 06/04/24 1657        Post  Biometrics   Height 5' 3 (1.6 m)    Weight 80.6 kg    Waist Circumference 38 inches    Hip Circumference 44.5 inches    Waist to Hip Ratio 0.85 %    BMI (Calculated) 31.48    Triceps Skinfold 21 mm    % Body Fat 40.8 %    Grip Strength 20 kg    Single Leg Stand 5 seconds          Nutrition:  Nutrition Therapy & Goals - 04/16/24 1055       Nutrition Therapy   Diet Heart Healthy Diet    Drug/Food Interactions Statins/Certain Fruits      Personal Nutrition Goals   Nutrition Goal Patient to identify strategies for reducing cardiovascular risk by attending the [facility] education and nutrition series weekly.   goal in progress.   Personal Goal #2 Patient to improve diet quality by using the plate method as a guide for meal planning to include lean protein/plant protein, fruits, vegetables, whole grains, nonfat dairy as part of a well-balanced diet.   goal in progress.   Comments Goals in progress. 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 She continues to attend the [facility] education and nutrition series regularly. Lipids are well controlled at goal (treated with metformin ). A1c is not at goal. She is down 1.1# since starting with our program.  Patient will benefit from participation in intensive cardiac rehab for nutrition education, exercise, and lifestyle modification.      Intervention Plan   Intervention Prescribe, educate and counsel regarding individualized specific dietary modifications aiming towards  targeted core components such as weight, hypertension, lipid management, diabetes, heart failure and other comorbidities.;Nutrition handout(s) given to patient.    Expected Outcomes Short Term Goal: Understand basic principles of dietary content, such as calories, fat, sodium, cholesterol and nutrients.;Long Term Goal: Adherence to prescribed nutrition plan.          Nutrition Discharge:  Nutrition Assessments - 06/04/24 1703       Rate Your Plate Scores   Post Score 73          Education Questionnaire Score:  Knowledge Questionnaire Score - 06/04/24 1705       Knowledge Questionnaire Score   Post Score 22/24          Goals reviewed with patient; copy given to patient.Pt graduates from  Intensive/Traditional cardiac rehab program today with completion of  60 exercise and education sessions. Pt maintained good attendance and progressed nicely during their participation in rehab as evidenced by increased MET level. Adalida increased her distance on her post exercise walk test by 122 feet and lost 2.7 kg.  Medication list reconciled. Repeat  PHQ score- 1 .  Pt has made significant lifestyle changes and should be commended for their success. Reagan  achieved her goals during cardiac rehab.   Pt plans to continue exercise at  the senior center, walking and using hand weights. We are proud of Mayci's progress and weight loss![name] RN BSN

## 2024-06-08 ENCOUNTER — Encounter (HOSPITAL_COMMUNITY)
Admission: RE | Admit: 2024-06-08 | Discharge: 2024-06-08 | Disposition: A | Discharge status: Home / Self Care | Source: Ambulatory Visit | Attending: Cardiology | Admitting: Cardiology

## 2024-06-08 DIAGNOSIS — I214 Non-ST elevation (NSTEMI) myocardial infarction: Secondary | ICD-10-CM | POA: Diagnosis not present

## 2024-06-14 ENCOUNTER — Inpatient Hospital Stay

## 2024-06-14 ENCOUNTER — Other Ambulatory Visit: Payer: Self-pay

## 2024-06-19 ENCOUNTER — Telehealth: Payer: Self-pay | Admitting: Hematology

## 2024-06-19 NOTE — Telephone Encounter (Signed)
 Tanya Walsh called in to cancel her 10/2 injection appt and she stated that she wanted it on her scheduled date with Dr. Lanny on 10/30.

## 2024-06-29 ENCOUNTER — Telehealth: Payer: Self-pay

## 2024-06-29 NOTE — Telephone Encounter (Signed)
 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 Patient NOT notified of PIN (1234) on 06/29/2024  Phone note routed to covering staff for follow-up.

## 2024-07-04 ENCOUNTER — Ambulatory Visit: Admitting: Neurology

## 2024-07-05 ENCOUNTER — Encounter: Admitting: Cardiology

## 2024-07-05 ENCOUNTER — Ambulatory Visit: Admitting: Neurology

## 2024-07-05 DIAGNOSIS — G4733 Obstructive sleep apnea (adult) (pediatric): Secondary | ICD-10-CM | POA: Diagnosis not present

## 2024-07-05 NOTE — Telephone Encounter (Signed)
 Patient agreement reviewed and signed on 07/05/2024.  WatchPAT issued to patient on 07/05/2024 by Joshua Dalton Seip, CMA. Patient aware to not open the WatchPAT box until contacted with the activation PIN. Patient profile initialized in Cloud[employer] on 07/05/2024 by [facility]. Device serial number: [id number]  Please list Reason for Call as Advice Only and type WatchPAT issued to patient in the comment box.

## 2024-07-11 ENCOUNTER — Other Ambulatory Visit: Payer: Self-pay

## 2024-07-11 DIAGNOSIS — C7A8 Other malignant neuroendocrine tumors: Secondary | ICD-10-CM

## 2024-07-11 DIAGNOSIS — K769 Liver disease, unspecified: Secondary | ICD-10-CM

## 2024-07-11 DIAGNOSIS — C7A019 Malignant carcinoid tumor of the small intestine, unspecified portion: Secondary | ICD-10-CM

## 2024-07-11 NOTE — Assessment & Plan Note (Signed)
 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  Liver biopsy was attempted, but the liver lesion was not visible on ultrasound. -She started Sandostatin  injection in June 2022.  Overall tolerating well. Will continue.  -last restaging CT on 09/07/2022 showed stable disease  -She is clinically doing well overall, still has intermittent pain on the left side of abdomen, overall better than last year. -She has developed hyperglycemia, possibly related to Sandostatin  injection, she is on metformin  now.  Discussed diabetic diet, and I encouraged her to exercise. 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  Treatment was subsequently stopped. - Restaging dotatate PET scan on April 05, 2024 showed improved peritoneal metastasis, and similar hypermetabolic liver met.  No new lesions. -Continue Sandostatin  injection monthly

## 2024-07-12 ENCOUNTER — Inpatient Hospital Stay: Admitting: Hematology

## 2024-07-12 ENCOUNTER — Ambulatory Visit: Attending: Nurse Practitioner

## 2024-07-12 ENCOUNTER — Inpatient Hospital Stay

## 2024-07-12 ENCOUNTER — Other Ambulatory Visit: Payer: Self-pay

## 2024-07-12 ENCOUNTER — Inpatient Hospital Stay: Attending: Hematology

## 2024-07-12 VITALS — BP 149/90 | HR 67 | Temp 98.8°F | Resp 17

## 2024-07-12 DIAGNOSIS — I10 Essential (primary) hypertension: Secondary | ICD-10-CM

## 2024-07-12 DIAGNOSIS — C7B8 Other secondary neuroendocrine tumors: Secondary | ICD-10-CM | POA: Diagnosis present

## 2024-07-12 DIAGNOSIS — I82493 Acute embolism and thrombosis of other specified deep vein of lower extremity, bilateral: Secondary | ICD-10-CM | POA: Diagnosis not present

## 2024-07-12 DIAGNOSIS — E785 Hyperlipidemia, unspecified: Secondary | ICD-10-CM

## 2024-07-12 DIAGNOSIS — G4733 Obstructive sleep apnea (adult) (pediatric): Secondary | ICD-10-CM

## 2024-07-12 DIAGNOSIS — C7A8 Other malignant neuroendocrine tumors: Secondary | ICD-10-CM

## 2024-07-12 DIAGNOSIS — I2510 Atherosclerotic heart disease of native coronary artery without angina pectoris: Secondary | ICD-10-CM

## 2024-07-12 DIAGNOSIS — Z79899 Other long term (current) drug therapy: Secondary | ICD-10-CM | POA: Insufficient documentation

## 2024-07-12 LAB — CBC WITH DIFFERENTIAL (CANCER CENTER ONLY)
Abs Immature Granulocytes: 0 K/uL (ref 0.00–0.07)
Basophils Absolute: 0 K/uL (ref 0.0–0.1)
Basophils Relative: 1 %
Eosinophils Absolute: 0.1 K/uL (ref 0.0–0.5)
Eosinophils Relative: 4 %
HCT: 36.8 % (ref 36.0–46.0)
Hemoglobin: 12.1 g/dL (ref 12.0–15.0)
Immature Granulocytes: 0 %
Lymphocytes Relative: 37 %
Lymphs Abs: 1.1 K/uL (ref 0.7–4.0)
MCH: 29.4 pg (ref 26.0–34.0)
MCHC: 32.9 g/dL (ref 30.0–36.0)
MCV: 89.3 fL (ref 80.0–100.0)
Monocytes Absolute: 0.3 K/uL (ref 0.1–1.0)
Monocytes Relative: 9 %
Neutro Abs: 1.5 K/uL — ABNORMAL LOW (ref 1.7–7.7)
Neutrophils Relative %: 49 %
Platelet Count: 266 K/uL (ref 150–400)
RBC: 4.12 MIL/uL (ref 3.87–5.11)
RDW: 12.4 % (ref 11.5–15.5)
Smear Review: NORMAL
WBC Count: 2.9 K/uL — ABNORMAL LOW (ref 4.0–10.5)
nRBC: 0 % (ref 0.0–0.2)

## 2024-07-12 LAB — CMP (CANCER CENTER ONLY)
ALT: 10 U/L (ref 0–44)
AST: 14 U/L — ABNORMAL LOW (ref 15–41)
Albumin: 4 g/dL (ref 3.5–5.0)
Alkaline Phosphatase: 59 U/L (ref 38–126)
Anion gap: 7 (ref 5–15)
BUN: 11 mg/dL (ref 8–23)
CO2: 31 mmol/L (ref 22–32)
Calcium: 9.1 mg/dL (ref 8.9–10.3)
Chloride: 102 mmol/L (ref 98–111)
Creatinine: 0.96 mg/dL (ref 0.44–1.00)
GFR, Estimated: >60 mL/min (ref >60)
Glucose, Bld: 150 mg/dL — ABNORMAL HIGH (ref 70–99)
Potassium: 3.6 mmol/L (ref 3.5–5.1)
Sodium: 140 mmol/L (ref 135–145)
Total Bilirubin: 0.4 mg/dL (ref 0.0–1.2)
Total Protein: 7 g/dL (ref 6.5–8.1)

## 2024-07-12 MED ORDER — OCTREOTIDE ACETATE 30 MG IM KIT
30.0000 mg | PACK | Freq: Once | INTRAMUSCULAR | Status: AC
  Administered 2024-07-12: 30 mg via INTRAMUSCULAR
  Filled 2024-07-12: qty 1

## 2024-07-12 NOTE — Progress Notes (Signed)
 [facility]   [phone] [phone]   Clinic Follow up Note   Patient Care Team: Tanya Luke POUR, MD as PCP - General (Family Medicine) Tanya Wilbert SAUNDERS, MD as PCP - Sleep Medicine (Cardiology) Tanya Juliene SAUNDERS, DO as Consulting Physician (Neurology) Burton, [name], NP as Nurse Practitioner (Nurse Practitioner) Tanya Callander, MD as Consulting Physician (Oncology) Tanya Juliene SAUNDERS, DO as Consulting Physician (Neurology)  Date of Service:  07/12/2024  CHIEF COMPLAINT: f/u of NET  CURRENT THERAPY:  Sandostatin  injection monthly   Oncology History   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  Liver biopsy was attempted, but the liver lesion was not visible on ultrasound. -She started Sandostatin  injection in June 2022.  Overall tolerating well. Will continue.  -last restaging CT on 09/07/2022 showed stable disease  -She is clinically doing well overall, still has intermittent pain on the left side of abdomen, overall better than last year. -She has developed hyperglycemia, possibly related to Sandostatin  injection, she is on metformin  now.  Discussed diabetic diet, and I encouraged her to exercise. 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  Treatment was subsequently stopped. - Restaging dotatate PET scan on April 05, 2024 showed improved peritoneal metastasis, and similar hypermetabolic liver met.  No new lesions. 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 Pain is intermittent and not constant. Previous imaging in July, next scan planned for January. - Ordered CT scan in January to monitor tumor progression - Continue current treatment regimen with injections  Lower extremity pain and swelling, rule out deep vein thrombosis Intermittent lower extremity pain and swelling, concerning for possible deep vein thrombosis (DVT). No history of blood clots. Pain localized to the back of the leg, with tenderness upon palpation. - Ordered Doppler ultrasound to rule out DVT - Advised leg elevation to reduce swelling - If Doppler is positive, will discuss a/c treatment options  Narrow caliber stools with change in bowel habit Recent onset of narrow caliber stools over the past few weeks. No associated nausea or constipation. Previous colonoscopy in 2022 with polyp removal, no follow-up recommended. Differential includes possible colonic pathology. - Recommended colonoscopy to evaluate for colonic pathology - Advised use of stool softeners or Miralax  to assess stool consistency - Contacted GI specialist to discuss potential need for earlier colonoscopy  Plan - Due to the change of her bowel movement, I encouraged her to follow-up with GI - Will order stat Doppler of bilateral lower extremity, to rule out DVT.  It is scheduled for tomorrow. - Will proceed to Sandostatin  injection today and continue monthly - Follow-up in 3 months with lab and restaging CT scan.   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 Estelita Walsh Impression - Normal upper third of esophagus, middle third of esophagus and lower third of esophagus. - Z-line regular, 38 cm from the incisors. - Erythematous mucosa in the antrum. Biopsied. Clip (MR conditional) was placed. - Normal examined duodenum. Biopsied.   12/10/2020 Pathology Results   FINAL MICROSCOPIC DIAGNOSIS:   A. DUODENUM, BIOPSY:  - Duodenal mucosa with no significant pathologic findings.  - Negative for increased intraepithelial lymphocytes and villous  architectural changes.   B. STOMACH, ANTRUM, BIOPSY:  - Reactive gastropathy.  - Warthin-Starry stain is negative for Helicobacter pylori.    12/11/2020 Procedure   Colonoscopy, Dr. Estelita Walsh impression - One 5 mm polyp in the transverse colon, removed piecemeal using a cold biopsy forceps. Resected and retrieved. - Diverticulosis in the sigmoid colon, in the descending colon and in the transverse colon. - The examined portion of the ileum was normal. - Non-bleeding internal hemorrhoids.   12/11/2020 Pathology Results   FINAL MICROSCOPIC DIAGNOSIS:   A. COLON, TRANSVERSE, POLYPECTOMY:  - Tubular adenoma.  - Negative for high grade dysplasia.    12/24/2020 Procedure   Capsule endoscopy report: A polypoid nodular growth was noted at 3 hours and 30 minutes and an ulcerated, polypoid growth was noted at 3 hours and 54 minutes which is likely the cause of obscure GI blood loss   01/23/2021 Imaging   CT entero AP w contrast IMPRESSION: 1. No acute findings identified within the abdomen or pelvis. 2. There are several focal areas of intraluminal hyperenhancement within the small bowel loops. The largest is in the nondilated mid to distal jejunum measuring 1.3 cm. Cannot rule out underlying small bowel neoplasm. Correlation with tissue sampling results advised. Correlation with results from capsule endoscopy. 3. There is a enhancing soft tissue nodule with  central calcification within the central small bowel mesentery. This is nonspecific and may represent sequelae of inflammation/infection. Primary differential considerations include carcinoid tumor or metastatic adenopathy. If there is a clinical concern for carcinoid tumor consider further investigation with DOTATATE PET. 4. Left adrenal gland adenoma. 5. Distal colonic diverticulosis without signs of acute diverticulitis. 6. Aortic atherosclerosis.   02/16/2021 PET scan   IMPRESSION: 1. Evidence of well differentiated small bowel neuroendocrine tumor with mesenteric metastasis. 2. Approximately 7 discrete small bowel lesions with intense radiotracer activity. Lesion depicted on comparison CT enterography. 3. Two intensely radiotracer avid mesenteric implants centrally within the small bowel mesentery. 4. Single hepatic metastasis with a second potential hepatic metastasis versus peritoneal implant. 5. No small bowel obstruction. 6. No evidence of metastatic disease outside the abdomen pelvis. 7. Patient may be a candidate for peptide receptor radiotherapy (Lu-177 DOTATATE) available at [facility] molecular imaging department [phone]).   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 No new or progressive interval findings. 2. Stable appearance of the 17 mm nodule central mesenteric nodule seen to be hypermetabolic on recent PET-CT. The second mesenteric lesion identified on that study is not discernible today. 3. Stable 1.5 cm subcapsular low-density lesion posterior right liver since PET-CT  02/16/2021. 4. 2 small bowel nodules are  identified on imaging today although 7 discrete hypermetabolic small bowel lesions were seen on previous PET-CT. 5. Left colonic diverticulosis without diverticulitis. 6. Aortic Atherosclerosis (ICD10-I70.0).   02/22/2022 Imaging   EXAM: CT ABDOMEN AND PELVIS WITH CONTRAST  IMPRESSION: 1. Mural soft tissue nodule of the small bowel in the midline ventral abdomen is unchanged, as is a small adjacent metastatic mesenteric nodule. 2. No evidence of new metastatic disease in the abdomen or pelvis. 3. Diverticulosis without evidence of acute diverticulitis. 4. Coronary artery disease.   08/25/2023 Imaging   CT chest, abdomen, and pelvis with contrast  IMPRESSION: 1. Increased size of the neuroendocrine metastasis measuring 2.1 cm in the right hepatic lobe. 2. Stable enhancing nonobstructive small bowel lesions. 3. Stable mesenteric nodule. 4. Stable tiny soft tissue nodules in the bilateral posterior gluteal subcutaneous space.      Discussed the use of AI scribe software for clinical note transcription with the patient, who gave verbal consent to proceed.  History of Present Illness Tanya Walsh is a 68 year old female with metastatic neuroendocrine tumor who presents for follow-up. She is under the care of a gastroenterologist, Dr. True.  She experiences changes in bowel movements over the past few weeks, with stools described as narrow and thin, occurring once or twice daily. There is no constipation or nausea. Occasional pain occurs in various parts of her body, particularly when walking, with concern for a possible blood clot due to localized pain and swelling for a few days. She takes pain medication with minimal relief. A colonoscopy in 2022 resulted in polyp removal, with no follow-up since. She adheres to her daily medications without issues.     All other systems were reviewed with the patient and are negative.  MEDICAL HISTORY:   Past Medical History:  Diagnosis Date   Coronary artery disease    Depression    GI bleed 07/17/2015   Hypertension    Myocardial infarction [facility])    Nocturia 08/14/2019   Refusal of blood transfusions as patient is Jehovah's Witness     SURGICAL HISTORY: Past Surgical History:  Procedure Laterality Date   ABDOMINAL HYSTERECTOMY     BACK SURGERY     BIOPSY  12/10/2020   Procedure: BIOPSY;  Surgeon: Saintclair Jasper, MD;  Location: [facility] ENDOSCOPY;  Service: Gastroenterology;;   BIOPSY  12/11/2020   Procedure: BIOPSY;  Surgeon: Saintclair Jasper, MD;  Location: [facility] ENDOSCOPY;  Service: Gastroenterology;;   COLONOSCOPY WITH PROPOFOL  N/A 12/11/2020   Procedure: COLONOSCOPY WITH PROPOFOL ;  Surgeon: Saintclair Jasper, MD;  Location: [facility] ENDOSCOPY;  Service: Gastroenterology;  Laterality: N/A;   ESOPHAGOGASTRODUODENOSCOPY N/A 07/17/2015   Procedure: ESOPHAGOGASTRODUODENOSCOPY (EGD);  Surgeon: Lesta JULIANNA Fitz, MD;  Location: [facility] ENDOSCOPY;  Service: Endoscopy;  Laterality: N/A;   ESOPHAGOGASTRODUODENOSCOPY (EGD) WITH PROPOFOL  N/A 12/10/2020   Procedure: ESOPHAGOGASTRODUODENOSCOPY (EGD) WITH PROPOFOL ;  Surgeon: Saintclair Jasper, MD;  Location: [facility] ENDOSCOPY;  Service: Gastroenterology;  Laterality: N/A;   HEMOSTASIS CLIP PLACEMENT  12/10/2020   Procedure: HEMOSTASIS CLIP PLACEMENT;  Surgeon: Saintclair Jasper, MD;  Location: [facility] ENDOSCOPY;  Service: Gastroenterology;;   LEFT HEART CATH AND CORONARY ANGIOGRAPHY N/A 12/30/2023   Procedure: LEFT HEART CATH AND CORONARY ANGIOGRAPHY;  Surgeon: Jordan, Peter M, MD;  Location: [facility] INVASIVE CV LAB;  Service: Cardiovascular;  Laterality: N/A;    I have reviewed the social history and family history with the patient and they are unchanged from previous note.  ALLERGIES:  is allergic to bactrim [sulfamethoxazole-trimethoprim].  MEDICATIONS:  Current Outpatient Medications  Medication Sig Dispense Refill   acetaminophen  (TYLENOL ) 650 MG CR tablet Take 650 mg by mouth every 8 (eight) hours as  needed for pain.     aspirin  EC 81 MG tablet Take 1 tablet (81 mg total) by mouth daily. Swallow whole. 90 tablet 1   clopidogrel  (PLAVIX ) 75 MG tablet Take 1 tablet (75 mg total) by mouth daily. 90 tablet 1   cyclobenzaprine  (FLEXERIL ) 5 MG tablet Take 1 tablet (5 mg total) by mouth 3 (three) times daily as needed for muscle spasms. 30 tablet 0   DULoxetine  (CYMBALTA ) 60 MG capsule Take 60 mg by mouth in the morning.     fluticasone (FLONASE) 50 MCG/ACT nasal spray Place 1 spray into both nostrils as needed for allergies or rhinitis.     folic acid  (FOLVITE ) 800 MCG tablet Take 400 mcg by mouth in the morning.     gabapentin  (NEURONTIN ) 100 MG capsule Take 1 capsule (100 mg total) by mouth at bedtime. 30 capsule 5   isosorbide  mononitrate (IMDUR ) 30 MG 24 hr tablet Take 0.5 tablets (15 mg total) by mouth daily. Can take a whole tablet if needed 90 tablet 1   losartan -hydrochlorothiazide  (HYZAAR) 50-12.5 MG tablet Take 1 tablet by mouth in the morning.     metFORMIN  (GLUCOPHAGE -XR) 500 MG 24 hr tablet Take 500 mg by mouth daily with breakfast.     Multiple Vitamins-Minerals (MULTIVITAMIN ADULTS 50+) TABS Take 1 tablet by mouth daily. (Patient not taking: Reported on 06/06/2024)     nitroGLYCERIN  (NITROSTAT ) 0.4 MG SL tablet Place 1 tablet (0.4 mg total) under the tongue every 5 (five) minutes as needed for chest pain. 30 tablet 0   ondansetron  (ZOFRAN ) 8 MG tablet Take 1 tablet (8 mg total) by mouth 2 (two) times daily as needed for nausea or vomiting. 20 tablet 0   pantoprazole  (PROTONIX ) 40 MG tablet Take 40 mg by mouth in the morning.     rosuvastatin  (CRESTOR ) 20 MG tablet Take 20 mg by mouth in the morning.     traMADol  (ULTRAM ) 50 MG tablet Take 1 tablet (50 mg total) by mouth every 12 (twelve) hours as needed. 20 tablet 0   trolamine salicylate (ASPERCREME) 10 % cream Apply 1 application topically as needed for muscle pain.     No current facility-administered medications for this visit.     PHYSICAL EXAMINATION: ECOG PERFORMANCE STATUS: 1 - Symptomatic but completely ambulatory  There were no vitals filed for this visit. Wt Readings from Last 3 Encounters:  06/04/24 177 lb 11.1 oz (80.6 kg)  04/30/24 176 lb (79.8 kg)  04/19/24 179 lb 3.2 oz (81.3 kg)     GENERAL:alert, no distress and comfortable SKIN: skin color, texture, turgor are normal, no rashes or significant lesions EYES: normal, Conjunctiva are pink and non-injected, sclera clear NECK: supple, thyroid  normal size, non-tender, without nodularity LYMPH:  no palpable lymphadenopathy in the cervical, axillary  LUNGS: clear to auscultation and percussion with normal breathing effort HEART: regular rate & rhythm and no murmurs and no lower extremity edema ABDOMEN:abdomen soft, non-tender and normal bowel sounds Musculoskeletal:no cyanosis of digits and no clubbing  NEURO: alert & oriented x 3 with fluent speech, no focal motor/sensory deficits  Physical Exam ABDOMEN: Bowel sounds decreased. EXTREMITIES: Right leg tender to palpation.  LABORATORY DATA:  I have reviewed the data as listed    Latest Ref Rng & Units 07/12/2024   11:31 AM 04/19/2024   11:29 AM 03/20/2024   12:37 PM  CBC  WBC 4.0 - 10.5 Walsh/uL 2.9  3.4  4.4   Hemoglobin 12.0 - 15.0 g/dL 87.8  87.6  87.9   Hematocrit 36.0 - 46.0 % 36.8  37.9  36.5   Platelets 150 - 400 Walsh/uL 266  272  240         Latest Ref Rng & Units 07/12/2024   11:31 AM 04/19/2024   11:29 AM 03/20/2024   12:37 PM  CMP  Glucose 70 - 99 mg/dL 849  826  802   BUN 8 - 23 mg/dL 11  10  8    Creatinine 0.44 - 1.00 mg/dL 9.03  9.12  9.10   Sodium 135 - 145 mmol/L 140  139  141   Potassium 3.5 - 5.1 mmol/L 3.6  4.0  3.5   Chloride 98 - 111 mmol/L 102  103  104   CO2 22 - 32 mmol/L 31  32  31   Calcium  8.9 - 10.3 mg/dL 9.1  9.1  9.2   Total Protein 6.5 - 8.1 g/dL 7.0  6.8  6.8   Total Bilirubin 0.0 - 1.2 mg/dL 0.4  0.6  0.6   Alkaline Phos 38 - 126 U/L 59  56  56   AST 15 - 41  U/L 14  18  18    ALT 0 - 44 U/L 10  14  18        RADIOGRAPHIC STUDIES: I have personally reviewed the radiological images as listed and agreed with the findings in the report. No results found.    Orders Placed This Encounter  Procedures   CT ABDOMEN PELVIS W CONTRAST    Standing Status:   Future    Expected Date:   10/03/2024    Expiration Date:   07/12/2025    If indicated for the ordered procedure, I authorize the administration of contrast media per Radiology protocol:   Yes    Does the patient have a contrast media/X-ray dye allergy?:   No    Preferred imaging location?:   [facility]    If indicated for the ordered procedure, I authorize the administration of oral contrast media per Radiology protocol:   Yes   All questions were answered. The patient knows to call the clinic with any problems, questions or concerns. No barriers to learning was detected. The total time spent in the appointment was 30 minutes, including review of chart and various tests results, discussions about plan of care and coordination of care plan     Onita Mattock, MD 07/12/2024

## 2024-07-12 NOTE — Procedures (Signed)
   SLEEP STUDY REPORT Patient Information Study Date: 07/05/2024 Patient Name: Tanya Walsh Patient ID: [id number] Birth Date: [birth year] Age: 68 Gender: Female BMI: 31.3 (W=176 lb, H=5' 3'') Referring Physician: Jackee Alberts, NP  TEST DESCRIPTION: Home sleep apnea testing was completed using the WatchPat, a Type 1 device, utilizing peripheral arterial tonometry (PAT), chest movement, actigraphy, pulse oximetry, pulse rate, body position and snore. AHI was calculated with apnea and hypopnea using valid sleep time as the denominator. RDI includes apneas, hypopneas, and RERAs. The data acquired and the scoring of sleep and all associated events were performed in accordance with the recommended standards and specifications as outlined in the AASM Manual for the Scoring of Sleep and Associated Events 2.2.0 (2015).  FINDINGS:  1. Moderate Obstructive Sleep Apnea with AHI 27.3/hr.  2. No significant Central Sleep Apnea with pAHIc 9.4/hr.  3. Oxygen desaturations as low as 83%.  4. Severe snoring was present. O2 sats were < 88% for 19.3 min.  5. Total sleep time was 8 hrs and 18 min.  6. 22.7% of total sleep time was spent in REM sleep.  7. Normal sleep onset latency at 22 min  8. Prolonged REM sleep onset latency at 215 min.  9. Total awakenings were 11. 10. Arrhythmia detection: None  DIAGNOSIS: Moderate Obstructive Sleep Apnea (G47.33) Nocturnal Hypoxemia  RECOMMENDATIONS: 1. Clinical correlation of these findings is necessary. The decision to treat obstructive sleep apnea (OSA) is usually based on the presence of apnea symptoms or the presence of associated medical conditions such as Hypertension, Congestive Heart Failure, Atrial Fibrillation or Obesity. The most common symptoms of OSA are snoring, gasping for breath while sleeping, daytime sleepiness and fatigue. 2. Initiating apnea therapy is recommended given the presence of symptoms and/or associated conditions. Recommend  proceeding with one of the following:  a. Auto-CPAP therapy with a pressure range of 5-20cm H2O.  b. An oral appliance (OA) that can be obtained from certain dentists with expertise in sleep medicine. These are primarily of use in non-obese patients with mild and moderate disease.  c. An ENT consultation which may be useful to look for specific causes of obstruction and possible treatment options.  d. If patient is intolerant to PAP therapy, consider referral to ENT for evaluation for hypoglossal nerve stimulator. 3. Close follow-up is necessary to ensure success with CPAP or oral appliance therapy for maximum benefit . 4. A follow-up oximetry study on CPAP is recommended to assess the adequacy of therapy and determine the need for supplemental oxygen or the potential need for Bi-level therapy. An arterial blood gas to determine the adequacy of baseline ventilation and oxygenation should also be considered. 5. Healthy sleep recommendations include: adequate nightly sleep (normal 7-9 hrs/night), avoidance of caffeine after noon and alcohol near bedtime, and maintaining a sleep environment that is cool, dark and quiet. 6. Weight loss for overweight patients is recommended. Even modest amounts of weight loss can significantly improve the severity of sleep apnea. 7. Snoring recommendations include: weight loss where appropriate, side sleeping, and avoidance of alcohol before bed. 8. Operation of motor vehicle should not be performed when sleepy.  Signature: Wilbert Bihari, MD; [facility]; Diplomat, American Board of Sleep Medicine Electronically Signed: 07/12/2024 5:37:24 PM

## 2024-07-13 ENCOUNTER — Ambulatory Visit (HOSPITAL_COMMUNITY)
Admission: RE | Admit: 2024-07-13 | Discharge: 2024-07-13 | Disposition: A | Discharge status: Home / Self Care | Source: Ambulatory Visit | Attending: Hematology | Admitting: Hematology

## 2024-07-13 ENCOUNTER — Other Ambulatory Visit: Payer: Self-pay

## 2024-07-13 DIAGNOSIS — I82493 Acute embolism and thrombosis of other specified deep vein of lower extremity, bilateral: Secondary | ICD-10-CM | POA: Diagnosis present

## 2024-07-19 ENCOUNTER — Other Ambulatory Visit: Payer: Self-pay | Admitting: Neurology

## 2024-07-20 ENCOUNTER — Ambulatory Visit (HOSPITAL_COMMUNITY): Payer: Self-pay | Admitting: Nurse Practitioner

## 2024-07-20 NOTE — Telephone Encounter (Signed)
 LVM asking pt to call our office to discuss test results. 1st attempt

## 2024-07-23 NOTE — Telephone Encounter (Signed)
The patient has been notified of the result. Left detailed message on voicemail and informed patient to call back.

## 2024-08-09 ENCOUNTER — Inpatient Hospital Stay (HOSPITAL_COMMUNITY)
Admission: EM | Admit: 2024-08-09 | Discharge: 2024-08-14 | DRG: 322 | Disposition: A | Attending: Hospitalist | Admitting: Hospitalist

## 2024-08-09 ENCOUNTER — Other Ambulatory Visit: Payer: Self-pay

## 2024-08-09 ENCOUNTER — Emergency Department (HOSPITAL_COMMUNITY)

## 2024-08-09 DIAGNOSIS — Z90710 Acquired absence of both cervix and uterus: Secondary | ICD-10-CM

## 2024-08-09 DIAGNOSIS — E785 Hyperlipidemia, unspecified: Secondary | ICD-10-CM | POA: Diagnosis present

## 2024-08-09 DIAGNOSIS — I10 Essential (primary) hypertension: Secondary | ICD-10-CM | POA: Diagnosis present

## 2024-08-09 DIAGNOSIS — Z7902 Long term (current) use of antithrombotics/antiplatelets: Secondary | ICD-10-CM

## 2024-08-09 DIAGNOSIS — C7A8 Other malignant neuroendocrine tumors: Secondary | ICD-10-CM | POA: Diagnosis present

## 2024-08-09 DIAGNOSIS — Z7984 Long term (current) use of oral hypoglycemic drugs: Secondary | ICD-10-CM

## 2024-08-09 DIAGNOSIS — R350 Frequency of micturition: Secondary | ICD-10-CM | POA: Diagnosis present

## 2024-08-09 DIAGNOSIS — Z882 Allergy status to sulfonamides status: Secondary | ICD-10-CM

## 2024-08-09 DIAGNOSIS — Z79899 Other long term (current) drug therapy: Secondary | ICD-10-CM

## 2024-08-09 DIAGNOSIS — I214 Non-ST elevation (NSTEMI) myocardial infarction: Principal | ICD-10-CM | POA: Diagnosis present

## 2024-08-09 DIAGNOSIS — I2510 Atherosclerotic heart disease of native coronary artery without angina pectoris: Secondary | ICD-10-CM | POA: Diagnosis present

## 2024-08-09 DIAGNOSIS — I252 Old myocardial infarction: Secondary | ICD-10-CM

## 2024-08-09 DIAGNOSIS — E1165 Type 2 diabetes mellitus with hyperglycemia: Secondary | ICD-10-CM | POA: Diagnosis present

## 2024-08-09 DIAGNOSIS — R079 Chest pain, unspecified: Principal | ICD-10-CM | POA: Diagnosis present

## 2024-08-09 DIAGNOSIS — I200 Unstable angina: Secondary | ICD-10-CM | POA: Diagnosis present

## 2024-08-09 DIAGNOSIS — R300 Dysuria: Secondary | ICD-10-CM | POA: Diagnosis present

## 2024-08-09 DIAGNOSIS — Z955 Presence of coronary angioplasty implant and graft: Secondary | ICD-10-CM

## 2024-08-09 DIAGNOSIS — Z823 Family history of stroke: Secondary | ICD-10-CM

## 2024-08-09 DIAGNOSIS — Z7982 Long term (current) use of aspirin: Secondary | ICD-10-CM

## 2024-08-09 DIAGNOSIS — Z8249 Family history of ischemic heart disease and other diseases of the circulatory system: Secondary | ICD-10-CM

## 2024-08-09 DIAGNOSIS — E876 Hypokalemia: Secondary | ICD-10-CM | POA: Diagnosis present

## 2024-08-09 DIAGNOSIS — G4733 Obstructive sleep apnea (adult) (pediatric): Secondary | ICD-10-CM | POA: Diagnosis present

## 2024-08-09 DIAGNOSIS — C7B8 Other secondary neuroendocrine tumors: Secondary | ICD-10-CM | POA: Diagnosis present

## 2024-08-09 DIAGNOSIS — C787 Secondary malignant neoplasm of liver and intrahepatic bile duct: Secondary | ICD-10-CM | POA: Diagnosis present

## 2024-08-09 DIAGNOSIS — Z881 Allergy status to other antibiotic agents status: Secondary | ICD-10-CM

## 2024-08-09 DIAGNOSIS — C786 Secondary malignant neoplasm of retroperitoneum and peritoneum: Secondary | ICD-10-CM | POA: Diagnosis present

## 2024-08-09 DIAGNOSIS — Z803 Family history of malignant neoplasm of breast: Secondary | ICD-10-CM

## 2024-08-09 LAB — BASIC METABOLIC PANEL WITH GFR
Anion gap: 11 (ref 5–15)
BUN: 11 mg/dL (ref 8–23)
CO2: 25 mmol/L (ref 22–32)
Calcium: 9 mg/dL (ref 8.9–10.3)
Chloride: 101 mmol/L (ref 98–111)
Creatinine, Ser: 0.98 mg/dL (ref 0.44–1.00)
GFR, Estimated: >60 mL/min (ref >60)
Glucose, Bld: 226 mg/dL — ABNORMAL HIGH (ref 70–99)
Potassium: 3.4 mmol/L — ABNORMAL LOW (ref 3.5–5.1)
Sodium: 137 mmol/L (ref 135–145)

## 2024-08-09 LAB — CBC
HCT: 39.3 % (ref 36.0–46.0)
Hemoglobin: 12.9 g/dL (ref 12.0–15.0)
MCH: 29.7 pg (ref 26.0–34.0)
MCHC: 32.8 g/dL (ref 30.0–36.0)
MCV: 90.6 fL (ref 80.0–100.0)
Platelets: 284 K/uL (ref 150–400)
RBC: 4.34 MIL/uL (ref 3.87–5.11)
RDW: 12.4 % (ref 11.5–15.5)
WBC: 5.1 K/uL (ref 4.0–10.5)
nRBC: 0 % (ref 0.0–0.2)

## 2024-08-09 LAB — TROPONIN I (HIGH SENSITIVITY): Troponin I (High Sensitivity): 8 ng/L (ref <18)

## 2024-08-09 MED ORDER — ACETAMINOPHEN 325 MG PO TABS
650.0000 mg | ORAL_TABLET | Freq: Once | ORAL | Status: AC
  Administered 2024-08-10: 650 mg via ORAL
  Filled 2024-08-09: qty 2

## 2024-08-09 NOTE — ED Triage Notes (Signed)
 BIB [facility] from home. 1545 started having CP. 4/10 CP with EMS around 2000. 1 0.4 nitroglycerin  & 324 ASA given en route. Non-radiating, similar sensation felt with previous MI. Ambulatory. Increased Dizziness & CP with walking.  Initial vitals 146/74 , 62 HR , 18 RR 97 RA 192 CBG After nitroglycerin , 104 systolic

## 2024-08-09 NOTE — ED Provider Notes (Signed)
 Tanya Walsh Provider Note   CSN: [id number] Arrival date & time: 08/09/24  2120     Patient presents with: Chest Pain   Tanya Walsh is a 68 y.o. female.  She is brought in by ambulance for chest pain.  She said she was at home and acutely felt dizzy associated with diaphoresis.  Symptoms improved after she laid down but when she got back up it happened again and she also was experiencing substernal chest pain prior was not diaphoretic that time.  Symptoms continued so she called 911.  She was given aspirin  and nitroglycerin .  She still has chest pain now and also has a headache.  She says the last time she felt like this is when she had her heart attack back in April.  She had a cardiac cath at that time without intervention. She also has past medical history of a neuroendocrine tumor.   The history is provided by the patient.  Chest Pain Pain location:  Substernal area Pain quality: aching   Pain severity:  Moderate Onset quality:  Gradual Timing:  Constant Progression:  Unchanged Chronicity:  Recurrent Ineffective treatments:  Aspirin  and nitroglycerin  Associated symptoms: diaphoresis, dizziness and headache   Associated symptoms: no abdominal pain, no cough, no fever, no nausea, no shortness of breath and no vomiting   Risk factors: coronary artery disease        Prior to Admission medications   Medication Sig Start Date End Date Taking? Authorizing Provider  acetaminophen  (TYLENOL ) 650 MG CR tablet Take 650 mg by mouth every 8 (eight) hours as needed for pain.    [provider]  aspirin  EC 81 MG tablet Take 1 tablet (81 mg total) by mouth daily. Swallow whole. 04/02/24   Wyn Jackee VEAR Mickey., NP  clopidogrel  (PLAVIX ) 75 MG tablet Take 1 tablet (75 mg total) by mouth daily. 04/02/24   Wyn Jackee VEAR Mickey., NP  cyclobenzaprine  (FLEXERIL ) 5 MG tablet Take 1 tablet (5 mg total) by mouth 3 (three) times daily as needed for  muscle spasms. 12/11/20   Antoinette Doe, MD  DULoxetine  (CYMBALTA ) 60 MG capsule Take 60 mg by mouth in the morning.    [provider]  fluticasone (FLONASE) 50 MCG/ACT nasal spray Place 1 spray into both nostrils as needed for allergies or rhinitis. 11/11/21   [provider]  folic acid  (FOLVITE ) 800 MCG tablet Take 400 mcg by mouth in the morning.    [provider]  gabapentin  (NEURONTIN ) 100 MG capsule TAKE 1 CAPSULE BY MOUTH AT BEDTIME. 07/19/24   Skeet, Adam R, DO  isosorbide  mononitrate (IMDUR ) 30 MG 24 hr tablet Take 0.5 tablets (15 mg total) by mouth daily. Can take a whole tablet if needed 04/02/24   Wyn Jackee VEAR Mickey., NP  losartan -hydrochlorothiazide  (HYZAAR) 50-12.5 MG tablet Take 1 tablet by mouth in the morning. 03/21/18   [provider]  metFORMIN  (GLUCOPHAGE -XR) 500 MG 24 hr tablet Take 500 mg by mouth daily with breakfast. 09/07/22   [provider]  Multiple Vitamins-Minerals (MULTIVITAMIN ADULTS 50+) TABS Take 1 tablet by mouth daily. Patient not taking: Reported on 06/06/2024    [provider]  nitroGLYCERIN  (NITROSTAT ) 0.4 MG SL tablet Place 1 tablet (0.4 mg total) under the tongue every 5 (five) minutes as needed for chest pain. 03/29/18   Raford Lenis, MD  ondansetron  (ZOFRAN ) 8 MG tablet Take 1 tablet (8 mg total) by mouth 2 (two) times daily  as needed for nausea or vomiting. 02/21/24   Malva PARAS, MD  pantoprazole  (PROTONIX ) 40 MG tablet Take 40 mg by mouth in the morning.    [provider]  rosuvastatin  (CRESTOR ) 20 MG tablet Take 20 mg by mouth in the morning.    [provider]  traMADol  (ULTRAM ) 50 MG tablet Take 1 tablet (50 mg total) by mouth every 12 (twelve) hours as needed. 11/15/23   Lanny Callander, MD  trolamine salicylate (ASPERCREME) 10 % cream Apply 1 application topically as needed for muscle pain.    [provider]    Allergies: Bactrim [sulfamethoxazole-trimethoprim]    Review of  Systems  Constitutional:  Positive for diaphoresis. Negative for fever.  Respiratory:  Negative for cough and shortness of breath.   Cardiovascular:  Positive for chest pain.  Gastrointestinal:  Negative for abdominal pain, nausea and vomiting.  Neurological:  Positive for dizziness and headaches.    Updated Vital Signs BP (!) 143/86   Pulse (!) 57   Temp 97.7 F (36.5 C) (Oral)   Resp 11   SpO2 100%   Physical Exam Vitals and nursing note reviewed.  Constitutional:      General: She is not in acute distress.    Appearance: Normal appearance. She is well-developed.  HENT:     Head: Normocephalic and atraumatic.  Eyes:     Conjunctiva/sclera: Conjunctivae normal.  Cardiovascular:     Rate and Rhythm: Normal rate and regular rhythm.     Heart sounds: Normal heart sounds. No murmur heard. Pulmonary:     Effort: Pulmonary effort is normal. No respiratory distress.     Breath sounds: Normal breath sounds.  Abdominal:     Palpations: Abdomen is soft.     Tenderness: There is no abdominal tenderness.  Musculoskeletal:        General: No swelling.     Cervical back: Neck supple.     Right lower leg: No tenderness. No edema.     Left lower leg: No tenderness. No edema.  Skin:    General: Skin is warm and dry.     Capillary Refill: Capillary refill takes less than 2 seconds.  Neurological:     General: No focal deficit present.     Mental Status: She is alert.     Sensory: No sensory deficit.     Motor: No weakness.     (all labs ordered are listed, but only abnormal results are displayed) Labs Reviewed  BASIC METABOLIC PANEL WITH GFR - Abnormal; Notable for the following components:      Result Value   Potassium 3.4 (*)    Glucose, Bld 226 (*)    All other components within normal limits  GLUCOSE, CAPILLARY - Abnormal; Notable for the following components:   Glucose-Capillary 135 (*)    All other components within normal limits  CBG MONITORING, ED - Abnormal;  Notable for the following components:   Glucose-Capillary 150 (*)    All other components within normal limits  TROPONIN I (HIGH SENSITIVITY) - Abnormal; Notable for the following components:   Troponin I (High Sensitivity) 24 (*)    All other components within normal limits  TROPONIN I (HIGH SENSITIVITY) - Abnormal; Notable for the following components:   Troponin I (High Sensitivity) 97 (*)    All other components within normal limits  TROPONIN I (HIGH SENSITIVITY) - Abnormal; Notable for the following components:   Troponin I (High Sensitivity) 455 (*)    All other components  within normal limits  CBC  HEPARIN  LEVEL (UNFRACTIONATED)  URINALYSIS, W/ REFLEX TO CULTURE (INFECTION SUSPECTED)  TROPONIN I (HIGH SENSITIVITY)    EKG: EKG Interpretation Date/Time:  Thursday August 09 2024 21:26:21 EST Ventricular Rate:  55 PR Interval:  167 QRS Duration:  101 QT Interval:  438 QTC Calculation: 419 R Axis:   40  Text Interpretation: Sinus rhythm No significant change since prior 4/25 Confirmed by Towana Sharper [phone]) on 08/09/2024 9:30:07 PM  Radiology: [facility] Chest Port 1 View Result Date: 08/09/2024 EXAM: 1 VIEW(S) XRAY OF THE CHEST 08/09/2024 09:50:00 PM COMPARISON: Comparison with 12/29/2023. CLINICAL HISTORY: cp FINDINGS: LUNGS AND PLEURA: No focal pulmonary opacity. No pleural effusion. No pneumothorax. HEART AND MEDIASTINUM: No acute abnormality of the cardiac and mediastinal silhouettes. BONES AND SOFT TISSUES: No acute osseous abnormality. IMPRESSION: 1. No acute cardiopulmonary process. Electronically signed by: Elsie Gravely MD 08/09/2024 09:56 PM EST RP Workstation: [id number]     Procedures   Medications Ordered in the ED  acetaminophen  (TYLENOL ) tablet 650 mg (has no administration in time range)                                    Medical Decision Making Amount and/or Complexity of Data Reviewed Labs: ordered. Radiology: ordered.  Risk OTC  drugs. Prescription drug management. Decision regarding hospitalization.   This patient complains of dizziness and chest pain; this involves an extensive number of treatment Options and is a complaint that carries with it a high risk of complications and morbidity. The differential includes ACS, reflux, pneumonia, pneumothorax, PE  I ordered, reviewed and interpreted labs, which included CBC normal chemistries with mildly elevated glucose, initial troponin normal I ordered imaging studies which included chest x-ray and I independently    visualized and interpreted imaging which showed no acute findings Previous records obtained and reviewed in epic including her prior ED visit and cardiology evaluation Cardiac monitoring reviewed, normal sinus rhythm Social determinants considered, stress Critical Interventions: None  After the interventions stated above, I reevaluated the patient and found patient to be resting comfortably in no distress Admission and further testing considered, her care is signed out to Dr. Lorette to follow-up on serial tropes.  Her last presentation for NSTEMI was similar.  May need admission.      Final diagnoses:  Nonspecific chest pain    ED Discharge Orders     None          Towana Sharper BROCKS, MD 08/10/24 [phone]

## 2024-08-09 NOTE — ED Provider Notes (Signed)
 11:23 PM Assumed care from Dr. Towana, please see their note for full history, physical and decision making until this point. In brief this is a 68 y.o. year old female who presented to the ED tonight with Chest Pain     Dizzy, lightheaded, diaphoresis w/ near syncopal episode. Nap, woke up with light headedness and chest pain. Here now. H/O NSTEMI previously with similar symptoms. First trop good and ecg ok. Pending second.    Unclear delay in second troponin but it did go up a little bit to 24.  Her chest pain also returned.  Fentanyl  given which improved.  Discussed with cardiology who saw the patient bedside and agreed with hospitalist admission for further troponins and echo and consideration of catheterization.  Heparin  started.  Discussed with hospitalist for admission.  Third troponin of 97.  Patient's vital signs stable.  Patient admitted without further incident.  CRITICAL CARE Performed by: Selinda Sias Total critical care time: 35 minutes Critical care time was exclusive of separately billable procedures and treating other patients. Critical care was necessary to treat or prevent imminent or life-threatening deterioration. Critical care was time spent personally by me on the following activities: development of treatment plan with patient and/or surrogate as well as nursing, discussions with consultants, evaluation of patient's response to treatment, examination of patient, obtaining history from patient or surrogate, ordering and performing treatments and interventions, ordering and review of laboratory studies, ordering and review of radiographic studies, pulse oximetry and re-evaluation of patient's condition.   Labs, studies and imaging reviewed by myself and considered in medical decision making if ordered. Imaging interpreted by radiology.  Labs Reviewed  BASIC METABOLIC PANEL WITH GFR - Abnormal; Notable for the following components:      Result Value   Potassium 3.4 (*)     Glucose, Bld 226 (*)    All other components within normal limits  CBC  TROPONIN I (HIGH SENSITIVITY)  TROPONIN I (HIGH SENSITIVITY)    DG Chest Port 1 View  Final Result      No follow-ups on file.    Zephaniah Lubrano, Selinda, MD 08/10/24 [phone]

## 2024-08-10 ENCOUNTER — Other Ambulatory Visit (HOSPITAL_COMMUNITY): Payer: Self-pay

## 2024-08-10 ENCOUNTER — Inpatient Hospital Stay

## 2024-08-10 ENCOUNTER — Encounter (HOSPITAL_COMMUNITY): Payer: Self-pay | Admitting: Family Medicine

## 2024-08-10 ENCOUNTER — Inpatient Hospital Stay (HOSPITAL_COMMUNITY)

## 2024-08-10 ENCOUNTER — Encounter: Payer: Self-pay | Admitting: Hematology

## 2024-08-10 ENCOUNTER — Telehealth (HOSPITAL_COMMUNITY): Payer: Self-pay | Admitting: Pharmacy Technician

## 2024-08-10 ENCOUNTER — Observation Stay (HOSPITAL_COMMUNITY)

## 2024-08-10 DIAGNOSIS — C7A8 Other malignant neuroendocrine tumors: Secondary | ICD-10-CM

## 2024-08-10 DIAGNOSIS — R079 Chest pain, unspecified: Secondary | ICD-10-CM

## 2024-08-10 DIAGNOSIS — G4733 Obstructive sleep apnea (adult) (pediatric): Secondary | ICD-10-CM | POA: Diagnosis not present

## 2024-08-10 DIAGNOSIS — E1165 Type 2 diabetes mellitus with hyperglycemia: Secondary | ICD-10-CM | POA: Diagnosis not present

## 2024-08-10 DIAGNOSIS — I214 Non-ST elevation (NSTEMI) myocardial infarction: Secondary | ICD-10-CM | POA: Diagnosis present

## 2024-08-10 DIAGNOSIS — I200 Unstable angina: Secondary | ICD-10-CM | POA: Diagnosis not present

## 2024-08-10 DIAGNOSIS — I10 Essential (primary) hypertension: Secondary | ICD-10-CM

## 2024-08-10 LAB — ECHOCARDIOGRAM COMPLETE
AR max vel: 2.44 cm2
AV Area VTI: 2.45 cm2
AV Area mean vel: 2.3 cm2
AV Mean grad: 2 mmHg
AV Peak grad: 3.5 mmHg
Ao pk vel: 0.93 m/s
Area-P 1/2: 1.89 cm2
Height: 63 in
S' Lateral: 2.8 cm
Weight: 2712.54 [oz_av]

## 2024-08-10 LAB — URINALYSIS, W/ REFLEX TO CULTURE (INFECTION SUSPECTED)
Bilirubin Urine: NEGATIVE
Glucose, UA: NEGATIVE mg/dL
Ketones, ur: NEGATIVE mg/dL
Leukocytes,Ua: NEGATIVE
Nitrite: NEGATIVE
Protein, ur: NEGATIVE mg/dL
Specific Gravity, Urine: 1.01 (ref 1.005–1.030)
pH: 6 (ref 5.0–8.0)

## 2024-08-10 LAB — TROPONIN I (HIGH SENSITIVITY)
Troponin I (High Sensitivity): 24 ng/L — ABNORMAL HIGH (ref <18)
Troponin I (High Sensitivity): 97 ng/L — ABNORMAL HIGH (ref <18)
Troponin I (High Sensitivity): 455 ng/L (ref <18)

## 2024-08-10 LAB — CBG MONITORING, ED: Glucose-Capillary: 150 mg/dL — ABNORMAL HIGH (ref 70–99)

## 2024-08-10 LAB — GLUCOSE, CAPILLARY
Glucose-Capillary: 128 mg/dL — ABNORMAL HIGH (ref 70–99)
Glucose-Capillary: 132 mg/dL — ABNORMAL HIGH (ref 70–99)
Glucose-Capillary: 135 mg/dL — ABNORMAL HIGH (ref 70–99)
Glucose-Capillary: 139 mg/dL — ABNORMAL HIGH (ref 70–99)
Glucose-Capillary: 154 mg/dL — ABNORMAL HIGH (ref 70–99)

## 2024-08-10 LAB — HEPARIN LEVEL (UNFRACTIONATED): Heparin Unfractionated: 0.71 [IU]/mL — ABNORMAL HIGH (ref 0.30–0.70)

## 2024-08-10 MED ORDER — MORPHINE SULFATE (PF) 2 MG/ML IV SOLN
2.0000 mg | INTRAVENOUS | Status: DC | PRN
  Administered 2024-08-10 – 2024-08-13 (×3): 2 mg via INTRAVENOUS
  Filled 2024-08-10 (×2): qty 1

## 2024-08-10 MED ORDER — HYDROCHLOROTHIAZIDE 12.5 MG PO TABS
12.5000 mg | ORAL_TABLET | Freq: Every day | ORAL | Status: DC
  Administered 2024-08-10 – 2024-08-11 (×2): 12.5 mg via ORAL
  Filled 2024-08-10 (×2): qty 1

## 2024-08-10 MED ORDER — HEPARIN (PORCINE) 25000 UT/250ML-% IV SOLN
750.0000 [IU]/h | INTRAVENOUS | Status: DC
  Administered 2024-08-10: 1000 [IU]/h via INTRAVENOUS
  Administered 2024-08-11: 950 [IU]/h via INTRAVENOUS
  Administered 2024-08-12: 750 [IU]/h via INTRAVENOUS
  Filled 2024-08-10 (×3): qty 250

## 2024-08-10 MED ORDER — PANTOPRAZOLE SODIUM 40 MG PO TBEC
40.0000 mg | DELAYED_RELEASE_TABLET | Freq: Every day | ORAL | Status: DC
  Administered 2024-08-10 – 2024-08-14 (×4): 40 mg via ORAL
  Filled 2024-08-10 (×4): qty 1

## 2024-08-10 MED ORDER — POTASSIUM CHLORIDE CRYS ER 20 MEQ PO TBCR
20.0000 meq | EXTENDED_RELEASE_TABLET | Freq: Once | ORAL | Status: AC
  Administered 2024-08-10: 20 meq via ORAL
  Filled 2024-08-10: qty 1

## 2024-08-10 MED ORDER — ACETAMINOPHEN 325 MG PO TABS: 650.0000 mg | ORAL_TABLET | ORAL | Status: DC | PRN

## 2024-08-10 MED ORDER — POTASSIUM CHLORIDE CRYS ER 20 MEQ PO TBCR
40.0000 meq | EXTENDED_RELEASE_TABLET | Freq: Once | ORAL | Status: AC
  Administered 2024-08-10: 40 meq via ORAL
  Filled 2024-08-10: qty 2

## 2024-08-10 MED ORDER — LOSARTAN POTASSIUM-HCTZ 50-12.5 MG PO TABS: 1.0000 | ORAL_TABLET | Freq: Every morning | ORAL | Status: DC

## 2024-08-10 MED ORDER — NITROGLYCERIN 0.4 MG SL SUBL: 0.4000 mg | SUBLINGUAL_TABLET | SUBLINGUAL | Status: DC | PRN

## 2024-08-10 MED ORDER — ISOSORBIDE MONONITRATE ER 30 MG PO TB24
15.0000 mg | ORAL_TABLET | Freq: Every day | ORAL | Status: DC
  Administered 2024-08-10 – 2024-08-12 (×3): 15 mg via ORAL
  Filled 2024-08-10 (×3): qty 1

## 2024-08-10 MED ORDER — IOHEXOL 350 MG/ML SOLN
75.0000 mL | Freq: Once | INTRAVENOUS | Status: AC | PRN
  Administered 2024-08-10: 75 mL via INTRAVENOUS

## 2024-08-10 MED ORDER — LOSARTAN POTASSIUM 50 MG PO TABS
50.0000 mg | ORAL_TABLET | Freq: Every day | ORAL | Status: DC
  Administered 2024-08-10 – 2024-08-11 (×2): 50 mg via ORAL
  Filled 2024-08-10 (×2): qty 1

## 2024-08-10 MED ORDER — OXYCODONE HCL 5 MG PO TABS: 5.0000 mg | ORAL_TABLET | ORAL | Status: DC | PRN

## 2024-08-10 MED ORDER — ASPIRIN 81 MG PO TBEC
81.0000 mg | DELAYED_RELEASE_TABLET | Freq: Every day | ORAL | Status: DC
  Administered 2024-08-10 – 2024-08-14 (×5): 81 mg via ORAL
  Filled 2024-08-10 (×5): qty 1

## 2024-08-10 MED ORDER — POTASSIUM CHLORIDE CRYS ER 20 MEQ PO TBCR: 40.0000 meq | EXTENDED_RELEASE_TABLET | Freq: Once | ORAL | Status: DC

## 2024-08-10 MED ORDER — CLOPIDOGREL BISULFATE 75 MG PO TABS
75.0000 mg | ORAL_TABLET | Freq: Every day | ORAL | Status: DC
  Administered 2024-08-10 – 2024-08-14 (×5): 75 mg via ORAL
  Filled 2024-08-10 (×5): qty 1

## 2024-08-10 MED ORDER — FENTANYL CITRATE (PF) 50 MCG/ML IJ SOSY
50.0000 ug | PREFILLED_SYRINGE | Freq: Once | INTRAMUSCULAR | Status: AC
  Administered 2024-08-10: 50 ug via INTRAVENOUS
  Filled 2024-08-10: qty 1

## 2024-08-10 MED ORDER — GABAPENTIN 100 MG PO CAPS
100.0000 mg | ORAL_CAPSULE | Freq: Every day | ORAL | Status: DC
  Administered 2024-08-10 – 2024-08-13 (×4): 100 mg via ORAL
  Filled 2024-08-10 (×4): qty 1

## 2024-08-10 MED ORDER — INSULIN ASPART 100 UNIT/ML IJ SOLN
0.0000 [IU] | INTRAMUSCULAR | Status: DC
  Administered 2024-08-11 (×2): 1 [IU] via SUBCUTANEOUS
  Administered 2024-08-12: 2 [IU] via SUBCUTANEOUS
  Filled 2024-08-10: qty 1
  Filled 2024-08-10: qty 2
  Filled 2024-08-10: qty 1

## 2024-08-10 MED ORDER — DULOXETINE HCL 60 MG PO CPEP
60.0000 mg | ORAL_CAPSULE | Freq: Every day | ORAL | Status: DC
  Administered 2024-08-10 – 2024-08-14 (×4): 60 mg via ORAL
  Filled 2024-08-10 (×4): qty 1

## 2024-08-10 MED ORDER — ONDANSETRON HCL 4 MG/2ML IJ SOLN: 4.0000 mg | Freq: Four times a day (QID) | INTRAMUSCULAR | Status: DC | PRN

## 2024-08-10 MED ORDER — HEPARIN BOLUS VIA INFUSION
4000.0000 [IU] | Freq: Once | INTRAVENOUS | Status: AC
  Administered 2024-08-10: 4000 [IU] via INTRAVENOUS
  Filled 2024-08-10: qty 4000

## 2024-08-10 MED ORDER — TICAGRELOR 90 MG PO TABS
ORAL_TABLET | ORAL | 0 refills | Status: DC
  Filled 2024-08-10: qty 60, 30d supply, fill #0

## 2024-08-10 MED ORDER — ROSUVASTATIN CALCIUM 20 MG PO TABS
20.0000 mg | ORAL_TABLET | Freq: Every day | ORAL | Status: DC
  Administered 2024-08-10 – 2024-08-14 (×4): 20 mg via ORAL
  Filled 2024-08-10 (×4): qty 1

## 2024-08-10 NOTE — Progress Notes (Addendum)
 Progress Note  Patient Name: Tanya Walsh Date of Encounter: 08/10/2024 [facility] Cardiologist: None   Interval Summary    No chest pain this morning, does have lower abd pain.   Vital Signs Vitals:   08/10/24 0554 08/10/24 0613 08/10/24 0722 08/10/24 0747  BP:  120/78 105/75   Pulse:  (!) 57 (!) 59 (!) 57  Resp:  18 (!) 23 14  Temp: 98 F (36.7 C)  98.2 F (36.8 C)   TempSrc: Oral  Oral   SpO2:  100% 92% 100%  Weight:      Height:       No intake or output data in the 24 hours ending 08/10/24 0848    08/10/2024    5:22 AM 06/04/2024    4:57 PM 04/30/2024    1:24 PM  Last 3 Weights  Weight (lbs) 169 lb 8.5 oz 177 lb 11.1 oz 176 lb  Weight (kg) 76.9 kg 80.6 kg 79.833 kg      Telemetry/ECG   Sinus Rhythm - Personally Reviewed  Physical Exam  GEN: No acute distress.   Neck: No JVD Cardiac: RRR, no murmurs, rubs, or gallops.  Respiratory: Clear to auscultation bilaterally. GI: Soft, nontender, non-distended ; when asked about the location of her abdominal pain it is mostly hypogastric/central MS: No edema  Assessment & Plan   68 y.o. female Jehovah witness with a hx of CAD NSTEMI 12/2023 (was treated medically) with prior MI in [place]  (details unknown), HTN, HLD, OSA, metastatic neuroendocrine tumor of small intestine, history of GIB who is being seen 08/10/2024 for the evaluation of chest pain => with troponins over 400 consistent with mild non-STEMI  NSTEMI Known CAD-with angina -- [facility] 12/2023 50% ostial circumflex lesion with 30% distal left main to ostial LAD lesion and 70% proximal RCA lesion treated medically with Plavix  and Imdur .  -- presented this admission with chest pain last evening w/ associated diaphoresis and generalized weakness that improved with SL nitroglycerin   -- hsTn 8>>24>>97>>455, EKG on admission with slight j point elevation in lead II. Repeat EKG this morning no ST/T wave changes (consistent with non-STEMI) -- on IV  heparin , ASA, plavix , statin  -- echo pending  -- initial recommendations from overnight MD for possible cath, but she reports lower abd pain, feels like bladder pain? Along with urinary frequency and some dysuria. Given her CA hx, would anticipate the need for CT prior to cath. => I discussed with the patient and family after reviewing the situation with Tanya Rummer, NP.  I agree, especially in light of her small intestine neuroendocrine tumor history, concerns being a Jehovah's Witness with risk of bleeding, that we need these need to exclude significant progression of disease or other type of abnormality that may preclude us  proceeding with PCI of the RCA.  The difference between being on aspirin  and Plavix  prophylactically versus aspirin  Plavix  for stent in place is at prophylactic aspirin  Plavix  can be held without risk of stent occlusion, however once the stent is in place, this becomes much more precarious and more difficult to hold. Thankfully, she is not seeming to have any bleeding issues on IV heparin  with aspirin  or Plavix  which is somewhat reassuring.  If we are able to confirm with a CT scan of the abdomen that there is no gross abnormalities, I think we can proceed with cardiac catheterization with the plan to potentially perform PCI of the RCA if no other lesions are found.  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 Lanny  -- of note, she was started on Lutathera  and shortly afterwards experienced NSTEMI with severe hyperglycemia and treatment was stopped.  -- CT abd/pelvis 12/2023 noted re-demonstration of ill-defined hypoattenuating lesion in the right hepatic lobe along with stable 8 x 10 mm left adrenal nodule -- PET scan on April 05, 2024 showed improved peritoneal metastasis, and similar hypermetabolic liver met. No new  lesions -- reports lower abd pain, also urinary frequency some dysuria as well  -- check UA, culture  -- management per primary   HTN -- initially elevated, controlled this morning -- on losartan  50mg  daily, hydrochlorothiazide  12.5mg  daily and Imdur  15mg  daily   Otherwise management per primary   For questions or updates, please contact [facility] Please consult [email or web address] for contact info under   Signed, Tanya Rummer, NP    ATTENDING ATTESTATION  I have seen, examined and evaluated the patient this patient on rounds along with Tanya Rummer, NP.  After reviewing all the available data and chart, we discussed the patients laboratory, study & physical findings as well as symptoms in detail.  I agree with her findings, examination as well as impression recommendations as per our discussion.    Attending adjustments noted in italics.   Not a straightforward situation of mild non-STEMI in this very pleasant woman who has neuroendocrine tumor of small intestine and history of GI bleed.  She is noting abdominal pain as well as likely anginal chest pain.  Thankfully she is not anemic with no signs of bleeding on aspirin  Plavix  and IV heparin . If we are able to confirm that there is no gross abnormalities on abdominal CT to the point where it would be safe to proceed with PCI to RCA, I STEMI see no reason not to proceed.  If needed K with the primary team and they will plan to proceed with the appropriate testing.   Informed Consent   Shared Decision Making/Informed Consent The risks [stroke (1 in 1000), death (1 in 1000), kidney failure [usually temporary] (1 in 500), bleeding (1 in 200), allergic reaction [possibly serious] (1 in 200)], benefits (diagnostic support and management of coronary artery disease) and alternatives of a cardiac catheterization were discussed in detail with Tanya Walsh and she is willing to proceed.         Tanya MICAEL Clay, MD, MS Tanya Walsh, M.D., M.S. Interventional Cardiologist  [facility] Pager # [phone]

## 2024-08-10 NOTE — Hospital Course (Addendum)
 HPI: Tanya Walsh is a 68 y.o. female with medical history significant for hypertension, type 2 diabetes mellitus, CAD, and neuroendocrine tumor with metastases who presents with chest pain.   Patient reports that she was in her usual state of health and had been having a good day when she developed chest pain yesterday evening.  She had just returned home and changed her close when she developed chest pressure with diaphoresis and generalized weakness shortly before 4 PM.  Symptoms began to improve after laying down, she went to sleep, but then woke with the same symptoms.  She reports that the symptoms are the same as what she experienced in April when she was admitted with NSTEMI, though not as severe as they were then.   She was treated with nitroglycerin  x 1 and 324 mg aspirin  by EMS prior to arrival in the ED.  She reports that the symptoms improved with nitroglycerin .   ED Course: Upon arrival to the ED, patient is found to be afebrile and saturating mid 90s on room air with heart rate in the 50s and stable blood pressure.  Labs are most notable for potassium 3.4, glucose 226, normal CBC, and troponin of 8 which increased to 24 and then 97.  EKG demonstrates sinus rhythm and chest x-ray is negative for acute findings.   Cardiology was consulted by the ED physician, aspirin  and fentanyl  were administered, and the patient was started on IV heparin  infusion.  Cardiology planned for left heart catheterization for 08/13/2024

## 2024-08-10 NOTE — Progress Notes (Addendum)
 PHARMACY - ANTICOAGULATION CONSULT NOTE  Pharmacy Consult for Heparin  Indication: chest pain/ACS  Allergies  Allergen Reactions   Bactrim [Sulfamethoxazole-Trimethoprim] Swelling    Patient Measurements: Height: 5' 3 (160 cm) Weight: 76.9 kg (169 lb 8.5 oz) IBW/kg (Calculated) : 52.4 HEPARIN  DW (KG): 68.9  Vital Signs: Temp: 98.2 F (36.8 C) (11/28 0722) Temp Source: Oral (11/28 0722) BP: 105/75 (11/28 0722) Pulse Rate: 57 (11/28 0747)  Labs: Recent Labs    08/09/24 2202 08/10/24 0032 08/10/24 0307 08/10/24 0618 08/10/24 1216  HGB 12.9  --   --   --   --   HCT 39.3  --   --   --   --   PLT 284  --   --   --   --   HEPARINUNFRC  --   --   --   --  0.71*  CREATININE 0.98  --   --   --   --   TROPONINIHS 8 24* 97* 455*  --     Estimated Creatinine Clearance: 52.5 mL/min (by C-G formula based on SCr of 0.98 mg/dL).   Medical History: Past Medical History:  Diagnosis Date   Coronary artery disease    Depression    GI bleed 07/17/2015   Hypertension    Myocardial infarction [facility])    Nocturia 08/14/2019   Primary malignant neuroendocrine tumor of small intestine (HCC) 02/26/2021   Refusal of blood transfusions as patient is Jehovah's Witness     Medications:  No current facility-administered medications on file prior to encounter.   Current Outpatient Medications on File Prior to Encounter  Medication Sig Dispense Refill   acetaminophen  (TYLENOL ) 650 MG CR tablet Take 650 mg by mouth every 8 (eight) hours as needed for pain.     aspirin  EC 81 MG tablet Take 1 tablet (81 mg total) by mouth daily. Swallow whole. 90 tablet 1   clopidogrel  (PLAVIX ) 75 MG tablet Take 1 tablet (75 mg total) by mouth daily. 90 tablet 1   cyclobenzaprine  (FLEXERIL ) 5 MG tablet Take 1 tablet (5 mg total) by mouth 3 (three) times daily as needed for muscle spasms. 30 tablet 0   DULoxetine  (CYMBALTA ) 60 MG capsule Take 60 mg by mouth in the morning.     fluticasone (FLONASE) 50 MCG/ACT  nasal spray Place 1 spray into both nostrils as needed for allergies or rhinitis.     folic acid  (FOLVITE ) 800 MCG tablet Take 400 mcg by mouth in the morning.     gabapentin  (NEURONTIN ) 100 MG capsule TAKE 1 CAPSULE BY MOUTH AT BEDTIME. 30 capsule 1   isosorbide  mononitrate (IMDUR ) 30 MG 24 hr tablet Take 0.5 tablets (15 mg total) by mouth daily. Can take a whole tablet if needed 90 tablet 1   losartan -hydrochlorothiazide  (HYZAAR) 50-12.5 MG tablet Take 1 tablet by mouth in the morning.     metFORMIN  (GLUCOPHAGE -XR) 500 MG 24 hr tablet Take 500 mg by mouth daily with breakfast.     Multiple Vitamins-Minerals (MULTIVITAMIN ADULTS 50+) TABS Take 1 tablet by mouth daily. (Patient not taking: Reported on 06/06/2024)     nitroGLYCERIN  (NITROSTAT ) 0.4 MG SL tablet Place 1 tablet (0.4 mg total) under the tongue every 5 (five) minutes as needed for chest pain. 30 tablet 0   ondansetron  (ZOFRAN ) 8 MG tablet Take 1 tablet (8 mg total) by mouth 2 (two) times daily as needed for nausea or vomiting. 20 tablet 0   pantoprazole  (PROTONIX ) 40 MG tablet Take 40 mg  by mouth in the morning.     rosuvastatin  (CRESTOR ) 20 MG tablet Take 20 mg by mouth in the morning.     traMADol  (ULTRAM ) 50 MG tablet Take 1 tablet (50 mg total) by mouth every 12 (twelve) hours as needed. 20 tablet 0   trolamine salicylate (ASPERCREME) 10 % cream Apply 1 application topically as needed for muscle pain.       Assessment: 68 y.o. female with chest pain on IV heparin . Plans for cardiac cath today  -heparin  level 0.71 and just slightly below goal on 1000 units/hr  Goal of Therapy:  Heparin  level 0.3-0.7 units/ml Monitor platelets by anticoagulation protocol: Yes   Plan:  -Continue heparin  1000 units/hr -Will follow plans post cath  Prentice Poisson, PharmD Clinical Pharmacist **Pharmacist phone directory can now be found on [email or web address] ([email or web address]).  Listed under [facility]. Poisson Prentice Lenis 08/10/2024,1:33 PM  11/28 PM update -  still pending CT A/P read to rule out bleed (hx recurrent GIB - last 2022), not on the schedule for cath.  Given heparin  level was borderline above goal and her hx of GIB, will empirically reduce to 950 units/hr.   -F/u plans for intervention  Maurilio Fila, PharmD Clinical Pharmacist 08/10/2024  5:33 PM

## 2024-08-10 NOTE — Care Management Obs Status (Signed)
 MEDICARE OBSERVATION STATUS NOTIFICATION   Patient Details  Name: Tanya Walsh MRN: [id number] Date of Birth: 1956-08-01   Medicare Observation Status Notification Given:  Yes    Sh[place]a, Bahner 08/10/2024, 10:43 AM

## 2024-08-10 NOTE — H&P (Signed)
 History and Physical    MIETTE Walsh FMW:[id number] DOB: 1955/09/15 DOA: 08/09/2024  PCP: Tanya Luke POUR, MD   Patient coming from: Home   Chief Complaint: Chest pain   HPI: Tanya Walsh is a 68 y.o. female with medical history significant for hypertension, type 2 diabetes mellitus, CAD, and neuroendocrine tumor with metastases who presents with chest pain.  Patient reports that she was in her usual state of health and had been having a good day when she developed chest pain yesterday evening.  She had just returned home and changed her close when she developed chest pressure with diaphoresis and generalized weakness shortly before 4 PM.  Symptoms began to improve after laying down, she went to sleep, but then woke with the same symptoms.  She reports that the symptoms are the same as what she experienced in April when she was admitted with NSTEMI, though not as severe as they were then.  She was treated with nitroglycerin  x 1 and 324 mg aspirin  by EMS prior to arrival in the ED.  She reports that the symptoms improved with nitroglycerin .  ED Course: Upon arrival to the ED, patient is found to be afebrile and saturating mid 90s on room air with heart rate in the 50s and stable blood pressure.  Labs are most notable for potassium 3.4, glucose 226, normal CBC, and troponin of 8 which increased to 24 and then 97.  EKG demonstrates sinus rhythm and chest x-ray is negative for acute findings.  Cardiology was consulted by the ED physician, aspirin  and fentanyl  were administered, and the patient was started on IV heparin  infusion.  Review of Systems:  All other systems reviewed and apart from HPI, are negative.  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) 02/26/2021   Refusal of blood  transfusions as patient is Jehovah's Witness     Past Surgical History:  Procedure Laterality Date   ABDOMINAL HYSTERECTOMY     BACK SURGERY     BIOPSY  12/10/2020   Procedure: BIOPSY;  Surgeon: Saintclair Jasper, MD;  Location: [facility] ENDOSCOPY;  Service: Gastroenterology;;   BIOPSY  12/11/2020   Procedure: BIOPSY;  Surgeon: Saintclair Jasper, MD;  Location: [facility] ENDOSCOPY;  Service: Gastroenterology;;   COLONOSCOPY WITH PROPOFOL  N/A 12/11/2020   Procedure: COLONOSCOPY WITH PROPOFOL ;  Surgeon: Saintclair Jasper, MD;  Location: [facility] ENDOSCOPY;  Service: Gastroenterology;  Laterality: N/A;   ESOPHAGOGASTRODUODENOSCOPY N/A 07/17/2015   Procedure: ESOPHAGOGASTRODUODENOSCOPY (EGD);  Surgeon: Lesta JULIANNA Fitz, MD;  Location: [facility] ENDOSCOPY;  Service: Endoscopy;  Laterality: N/A;   ESOPHAGOGASTRODUODENOSCOPY (EGD) WITH PROPOFOL  N/A 12/10/2020   Procedure: ESOPHAGOGASTRODUODENOSCOPY (EGD) WITH PROPOFOL ;  Surgeon: Saintclair Jasper, MD;  Location: [facility] ENDOSCOPY;  Service: Gastroenterology;  Laterality: N/A;   HEMOSTASIS CLIP PLACEMENT  12/10/2020   Procedure: HEMOSTASIS CLIP PLACEMENT;  Surgeon: Saintclair Jasper, MD;  Location: [facility] ENDOSCOPY;  Service: Gastroenterology;;   LEFT HEART CATH AND CORONARY ANGIOGRAPHY N/A 12/30/2023   Procedure: LEFT HEART CATH AND CORONARY ANGIOGRAPHY;  Surgeon: Jordan, Peter M, MD;  Location: [facility] INVASIVE CV LAB;  Service: Cardiovascular;  Laterality: N/A;    Social History:   reports that she has never smoked. She has never used smokeless tobacco. She reports current alcohol use. She reports that she does not use drugs.  Allergies  Allergen Reactions   Bactrim [Sulfamethoxazole-Trimethoprim] Swelling    Family History  Problem Relation Age of Onset   Stroke Mother    Cancer Mother        brain   Heart disease Father    Heart disease Brother    Breast cancer Niece      Prior to Admission medications   Medication Sig Start Date End Date Taking? Authorizing Provider  acetaminophen  (TYLENOL ) 650 MG CR  tablet Take 650 mg by mouth every 8 (eight) hours as needed for pain.    [provider]  aspirin  EC 81 MG tablet Take 1 tablet (81 mg total) by mouth daily. Swallow whole. 04/02/24   Wyn Jackee Walsh Tanya., NP  clopidogrel  (PLAVIX ) 75 MG tablet Take 1 tablet (75 mg total) by mouth daily. 04/02/24   Wyn Jackee Walsh Tanya., NP  cyclobenzaprine  (FLEXERIL ) 5 MG tablet Take 1 tablet (5 mg total) by mouth 3 (three) times daily as needed for muscle spasms. 12/11/20   Tanya Doe, MD  DULoxetine  (CYMBALTA ) 60 MG capsule Take 60 mg by mouth in the morning.    [provider]  fluticasone (FLONASE) 50 MCG/ACT nasal spray Place 1 spray into both nostrils as needed for allergies or rhinitis. 11/11/21   [provider]  folic acid  (FOLVITE ) 800 MCG tablet Take 400 mcg by mouth in the morning.    [provider]  gabapentin  (NEURONTIN ) 100 MG capsule TAKE 1 CAPSULE BY MOUTH AT BEDTIME. 07/19/24   Tanya, Adam R, DO  isosorbide  mononitrate (IMDUR ) 30 MG 24 hr tablet Take 0.5 tablets (15 mg total) by mouth daily. Can take a whole tablet if needed 04/02/24   Tanya, Ernest H Jr., NP  losartan -hydrochlorothiazide  (HYZAAR) 50-12.5 MG tablet Take 1 tablet by mouth in the morning. 03/21/18   [provider]  metFORMIN  (GLUCOPHAGE -XR) 500 MG 24 hr tablet Take 500 mg by mouth daily with breakfast. 09/07/22   [provider]  Multiple Vitamins-Minerals (MULTIVITAMIN ADULTS 50+) TABS Take 1 tablet by mouth daily. Patient not taking: Reported on 06/06/2024    [provider]  nitroGLYCERIN  (NITROSTAT ) 0.4 MG SL tablet Place 1 tablet (0.4 mg total) under the tongue every 5 (five) minutes as needed for chest pain. 03/29/18   Raford Lenis, MD  ondansetron  (ZOFRAN ) 8 MG tablet Take 1 tablet (8 mg total) by mouth 2 (two) times daily as needed for nausea or vomiting. 02/21/24   Malva PARAS, MD  pantoprazole  (PROTONIX ) 40 MG tablet Take 40 mg by mouth in the morning.    [provider]  rosuvastatin  (CRESTOR ) 20 MG tablet Take 20 mg by mouth in the morning.    [provider]  traMADol  (ULTRAM ) 50 MG tablet Take 1 tablet (50 mg total) by mouth every 12 (twelve) hours as needed. 11/15/23   Lanny Callander, MD  trolamine salicylate (ASPERCREME) 10 % cream Apply 1 application topically as needed for muscle pain.    [provider]    Physical Exam: Vitals:   08/09/24 2300 08/10/24 0115 08/10/24 0135 08/10/24 0230  BP: (!) 147/87 124/78  118/80  Pulse: (!) 57 (!) 58  (!) 54  Resp: 18 16  19   Temp:   97.7 F (36.5 C)   TempSrc:   Oral   SpO2: 94% 94%  92%    Constitutional: NAD, no pallor or diaphoresis  Eyes: PERTLA, lids and conjunctivae normal ENMT: Mucous membranes are moist. Posterior pharynx clear of any exudate or lesions.  Neck: supple, no masses  Respiratory: no wheezing, no crackles. No accessory muscle use.  Cardiovascular: S1 & S2 heard, regular rate and rhythm. No extremity edema.  Abdomen: No tenderness, soft. Bowel sounds active.  Musculoskeletal: no clubbing / cyanosis. No joint deformity upper and lower extremities.   Skin: no significant rashes, lesions, ulcers. Warm, dry, well-perfused. Neurologic: CN 2-12 grossly intact. Moving all extremities. Alert and oriented.  Psychiatric: Pleasant. Cooperative.    Labs and Imaging on Admission: I have personally reviewed following labs and imaging studies  CBC: Recent Labs  Lab 08/09/24 2202  WBC 5.1  HGB 12.9  HCT 39.3  MCV 90.6  PLT 284   Basic Metabolic Panel: Recent Labs  Lab 08/09/24 2202  NA 137  K 3.4*  CL 101  CO2 25  GLUCOSE 226*  BUN 11  CREATININE 0.98  CALCIUM  9.0   GFR: CrCl cannot be calculated (Unknown ideal weight.). Liver Function Tests: No results for input(s): AST, ALT, ALKPHOS, BILITOT, PROT, ALBUMIN in the last 168 hours. No results for input(s): LIPASE, AMYLASE in the last 168 hours. No results for input(s): AMMONIA in  the last 168 hours. Coagulation Profile: No results for input(s): INR, PROTIME in the last 168 hours. Cardiac Enzymes: No results for input(s): CKTOTAL, CKMB, CKMBINDEX, TROPONINI in the last 168 hours. BNP (last 3 results) No results for input(s): PROBNP in the last 8760 hours. HbA1C: No results for input(s): HGBA1C in the last 72 hours. CBG: No results for input(s): GLUCAP in the last 168 hours. Lipid Profile: No results for input(s): CHOL, HDL, LDLCALC, TRIG, CHOLHDL, LDLDIRECT in the last 72 hours. Thyroid  Function Tests: No results for input(s): TSH, T4TOTAL, FREET4, T3FREE, THYROIDAB in the last 72 hours. Anemia Panel: No results for input(s): VITAMINB12, FOLATE, FERRITIN, TIBC, IRON, RETICCTPCT in the last 72 hours. Urine analysis:    Component Value Date/Time   COLORURINE YELLOW 12/29/2023 0639   APPEARANCEUR CLEAR 12/29/2023 0639   LABSPEC 1.012 12/29/2023 0639   PHURINE 5.0 12/29/2023 0639   GLUCOSEU 50 (A) 12/29/2023 0639   HGBUR NEGATIVE 12/29/2023 0639   BILIRUBINUR NEGATIVE 12/29/2023 0639   KETONESUR NEGATIVE 12/29/2023 0639   PROTEINUR NEGATIVE 12/29/2023 0639   UROBILINOGEN 0.2 07/17/2015 0813   NITRITE NEGATIVE 12/29/2023 0639   LEUKOCYTESUR TRACE (A) 12/29/2023 0639   Sepsis Labs: @LABRCNTIP (procalcitonin:4,lacticidven:4) )No results found for this or any previous visit (from the past 240 hours).   Radiological Exams on Admission: DG Chest Port 1 View Result Date: 08/09/2024 EXAM: 1 VIEW(S) XRAY OF THE CHEST 08/09/2024 09:50:00 PM COMPARISON: Comparison with 12/29/2023. CLINICAL HISTORY: cp FINDINGS: LUNGS AND PLEURA: No focal pulmonary opacity. No pleural effusion. No pneumothorax. HEART AND MEDIASTINUM: No acute abnormality of the cardiac and mediastinal silhouettes. BONES AND SOFT TISSUES: No acute osseous abnormality. IMPRESSION: 1. No acute cardiopulmonary process. Electronically signed by: Elsie Gravely MD 08/09/2024 09:56 PM EST RP Workstation: [id number]    EKG: Independently reviewed. Sinus rhythm.   Assessment/Plan   1. NSTEMI  - Appreciate cardiology consultation  - Treated with ASA 324 mg prior to arrival  - Continue IV heparin , ASA 81 mg, Plavix  75 mg, and Crestor , continue cardiac monitoring, trend troponin, check echocardiogram, keep NPO for now    2. Type II DM  - A1c was 7.5% in April 2025  - Check CBGs and use low-intensity SSI for now    3. Hypertension  - Continue Hyzaar   4. OSA  - CPAP while sleeping    5.  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 Lanny     DVT prophylaxis: IV heparin   Code Status: Full  Level of Care: Level of care: Telemetry Family Communication: None present   Disposition Plan:  Patient is from: Home Anticipated d/c is to: Home  Anticipated d/c date is: Possibly as early as 08/11/24  Patient currently: Pending echocardiogram, disposition planning  Consults called: Cardiology  Admission status: Observation     Evalene GORMAN Sprinkles, MD [facility]  08/10/2024, 4:44 AM

## 2024-08-10 NOTE — Progress Notes (Signed)
 Progress Note   Patient: Tanya Walsh FMW:[id number] DOB: August 02, 1956 DOA: 08/09/2024     0 DOS: the patient was seen and examined on 08/10/2024  Same-day progress note  Brief hospital course: HPI: Tanya Walsh is a 68 y.o. female with medical history significant for hypertension, type 2 diabetes mellitus, CAD, and neuroendocrine tumor with metastases who presents with chest pain.   Patient reports that she was in her usual state of health and had been having a good day when she developed chest pain yesterday evening.  She had just returned home and changed her close when she developed chest pressure with diaphoresis and generalized weakness shortly before 4 PM.  Symptoms began to improve after laying down, she went to sleep, but then woke with the same symptoms.  She reports that the symptoms are the same as what she experienced in April when she was admitted with NSTEMI, though not as severe as they were then.   She was treated with nitroglycerin  x 1 and 324 mg aspirin  by EMS prior to arrival in the ED.  She reports that the symptoms improved with nitroglycerin .   ED Course: Upon arrival to the ED, patient is found to be afebrile and saturating mid 90s on room air with heart rate in the 50s and stable blood pressure.  Labs are most notable for potassium 3.4, glucose 226, normal CBC, and troponin of 8 which increased to 24 and then 97.  EKG demonstrates sinus rhythm and chest x-ray is negative for acute findings.   Cardiology was consulted by the ED physician, aspirin  and fentanyl  were administered, and the patient was started on IV heparin  infusion.  Assessment and Plan:  1. NSTEMI  - Appreciate cardiology consultation  - Treated with ASA 324 mg prior to arrival  - Continue IV heparin , ASA 81 mg, Plavix  75 mg, and Crestor , continue cardiac monitoring, trend troponin, NPO for now in anticipation for cardiac extraction today. - Echocardiogram was done 11/28, showed normal ejection  fraction and no wall motion abnormalities   2. Type II DM  - A1c was 7.5% in April 2025  - Check CBGs and use low-intensity SSI for now     3. Hypertension  - Continue Hyzaar    4. OSA  - CPAP while sleeping     5. 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 Lanny         Subjective: chest pain has improved  Physical Exam: Vitals:   08/10/24 0554 08/10/24 0613 08/10/24 0722 08/10/24 0747  BP:  120/78 105/75   Pulse:  (!) 57 (!) 59 (!) 57  Resp:  18 (!) 23 14  Temp: 98 F (36.7 C)  98.2 F (36.8 C)   TempSrc: Oral  Oral   SpO2:  100% 92% 100%  Weight:      Height:       Constitutional: Alert, awake, calm, comfortable HEENT: Neck supple Respiratory: clear to auscultation bilaterally, no wheezing, no crackles. Normal respiratory effort. No accessory muscle use.  Cardiovascular: Regular rate and rhythm, no murmurs / rubs / gallops. No extremity edema. 2+ pedal pulses. No carotid bruits.  Abdomen: no tenderness, no masses palpated. No hepatosplenomegaly. Bowel sounds positive.  Musculoskeletal: no clubbing / cyanosis. No joint deformity upper and lower extremities. Good ROM, no contractures. Normal muscle tone.  Skin: no rashes, lesions, ulcers. No induration Neurologic: CN 2-12 grossly intact. Sensation  intact, DTR normal. Strength 5/5 x all 4 extremities.  Psychiatric: Normal judgment and insight. Alert and oriented x 3. Normal mood.   Data Reviewed:   Results reviewed, troponin was 455  Family Communication: None  Disposition: Status is: Inpatient Remains inpatient appropriate because: Ongoing recovery and treatment from acute non-ST elevation myocardial infarction  Planned Discharge Destination: Home    Time spent: 35 minutes  Author: Nena Rebel, MD 08/10/2024 2:31 PM  For on call review [email or web address].

## 2024-08-10 NOTE — Telephone Encounter (Addendum)
 Patient Product/process Development Scientist completed.    The patient is insured through [facility]. Patient has Medicare and is not eligible for a copay card, but may be able to apply for patient assistance or Medicare RX Payment Plan (Patient Must reach out to their plan, if eligible for payment plan), if available.    Ran test claim for Farxiga 10 mg and the current 30 day co-pay is $302.00 due to a $255.00 deductible.  Will be $47.00 once deductible is met.  Ran test claim for Jardiance 10 mg and the current 30 day co-pay is $302.00 due to a $255.00 deductible.  Will be $47.00 once deductible is met.  Ran test claim for Brilinta 90 mg and the current 30 day co-pay is $302.00 due to a $255.00 deductible.  Will be $47.00 once deductible is met.  This test claim was processed through [facility]- copay amounts may vary at other pharmacies due to pharmacy/plan contracts, or as the patient moves through the different stages of their insurance plan.     Reyes Sharps, CPHT Pharmacy Technician Patient Advocate Specialist Lead [facility] Patient Advocate Team Direct Number: [phone]  Fax: [phone]

## 2024-08-10 NOTE — Consult Note (Addendum)
 Cardiology Consultation   Patient ID: Tanya Walsh MRN: [id number]; DOB: 1956-07-24  Admit date: 08/09/2024 Date of Consult: 08/10/2024  PCP:  [name], MD   [facility] Providers Cardiologist:  None  Sleep Medicine:  Wilbert Bihari, MD       Patient Profile: Tanya Walsh is a 68 y.o. female with a hx of CAD NSTEMI 12/2023 (was treated medically) with prior MI in [place]  (details unknown), HTN, HLD, OSA, metastatic neuroendocrine tumor of small intestine, history of GIB who is being seen 08/10/2024 for the evaluation of chest pain.  History of Present Illness: Tanya Walsh reports that yesterday at around noon, she had an episode of diaphoresis with no chest pain that resolved within minutes. A few hours after that and while resting, she had another similar episode but this time it was associated with retrosternal, pressure-like chest pain. This episode was associated with left fingers numbness and nausea without vomiting. This episode lasted for a few minutes. She also reports that these episodes were similar to her presentation in 12/2023 when she had NSTEMI, but chest pain was more severe at that time.   She received 324 mg with EMS.   Troponin 8 =>24. EKG with sinus rhythm and no acute ischemic changes,   Past Medical History:  Diagnosis Date   Coronary artery disease    Depression    GI bleed 07/17/2015   Hypertension    Myocardial infarction [facility])    Nocturia 08/14/2019   Refusal of blood transfusions as patient is Jehovah's Witness     Past Surgical History:  Procedure Laterality Date   ABDOMINAL HYSTERECTOMY     BACK SURGERY     BIOPSY  12/10/2020   Procedure: BIOPSY;  Surgeon: Saintclair Jasper, MD;  Location: [facility] ENDOSCOPY;  Service: Gastroenterology;;   BIOPSY  12/11/2020   Procedure: BIOPSY;  Surgeon: Saintclair Jasper, MD;  Location: [facility] ENDOSCOPY;  Service: Gastroenterology;;   COLONOSCOPY WITH PROPOFOL  N/A 12/11/2020   Procedure: COLONOSCOPY WITH  PROPOFOL ;  Surgeon: Saintclair Jasper, MD;  Location: [facility] ENDOSCOPY;  Service: Gastroenterology;  Laterality: N/A;   ESOPHAGOGASTRODUODENOSCOPY N/A 07/17/2015   Procedure: ESOPHAGOGASTRODUODENOSCOPY (EGD);  Surgeon: Lesta JULIANNA Fitz, MD;  Location: [facility] ENDOSCOPY;  Service: Endoscopy;  Laterality: N/A;   ESOPHAGOGASTRODUODENOSCOPY (EGD) WITH PROPOFOL  N/A 12/10/2020   Procedure: ESOPHAGOGASTRODUODENOSCOPY (EGD) WITH PROPOFOL ;  Surgeon: Saintclair Jasper, MD;  Location: [facility] ENDOSCOPY;  Service: Gastroenterology;  Laterality: N/A;   HEMOSTASIS CLIP PLACEMENT  12/10/2020   Procedure: HEMOSTASIS CLIP PLACEMENT;  Surgeon: Saintclair Jasper, MD;  Location: [facility] ENDOSCOPY;  Service: Gastroenterology;;   LEFT HEART CATH AND CORONARY ANGIOGRAPHY N/A 12/30/2023   Procedure: LEFT HEART CATH AND CORONARY ANGIOGRAPHY;  Surgeon: Jordan, Peter M, MD;  Location: [facility] INVASIVE CV LAB;  Service: Cardiovascular;  Laterality: N/A;     Home Medications:  Prior to Admission medications   Medication Sig Start Date End Date Taking? Authorizing Provider  acetaminophen  (TYLENOL ) 650 MG CR tablet Take 650 mg by mouth every 8 (eight) hours as needed for pain.    [provider]  aspirin  EC 81 MG tablet Take 1 tablet (81 mg total) by mouth daily. Swallow whole. 04/02/24   Wyn Jackee VEAR Mickey., NP  clopidogrel  (PLAVIX ) 75 MG tablet Take 1 tablet (75 mg total) by mouth daily. 04/02/24   Wyn Jackee VEAR Mickey., NP  cyclobenzaprine  (FLEXERIL ) 5 MG tablet Take 1 tablet (5 mg total) by mouth 3 (three) times daily as needed for muscle spasms. 12/11/20  Antoinette Doe, MD  DULoxetine  (CYMBALTA ) 60 MG capsule Take 60 mg by mouth in the morning.    [provider]  fluticasone (FLONASE) 50 MCG/ACT nasal spray Place 1 spray into both nostrils as needed for allergies or rhinitis. 11/11/21   [provider]  folic acid  (FOLVITE ) 800 MCG tablet Take 400 mcg by mouth in the morning.    [provider]  gabapentin  (NEURONTIN ) 100 MG capsule TAKE  1 CAPSULE BY MOUTH AT BEDTIME. 07/19/24   Skeet, Adam R, DO  isosorbide  mononitrate (IMDUR ) 30 MG 24 hr tablet Take 0.5 tablets (15 mg total) by mouth daily. Can take a whole tablet if needed 04/02/24   Wyn Jackee VEAR Mickey., NP  losartan -hydrochlorothiazide  (HYZAAR) 50-12.5 MG tablet Take 1 tablet by mouth in the morning. 03/21/18   [provider]  metFORMIN  (GLUCOPHAGE -XR) 500 MG 24 hr tablet Take 500 mg by mouth daily with breakfast. 09/07/22   [provider]  Multiple Vitamins-Minerals (MULTIVITAMIN ADULTS 50+) TABS Take 1 tablet by mouth daily. Patient not taking: Reported on 06/06/2024    [provider]  nitroGLYCERIN  (NITROSTAT ) 0.4 MG SL tablet Place 1 tablet (0.4 mg total) under the tongue every 5 (five) minutes as needed for chest pain. 03/29/18   Raford Lenis, MD  ondansetron  (ZOFRAN ) 8 MG tablet Take 1 tablet (8 mg total) by mouth 2 (two) times daily as needed for nausea or vomiting. 02/21/24   Malva PARAS, MD  pantoprazole  (PROTONIX ) 40 MG tablet Take 40 mg by mouth in the morning.    [provider]  rosuvastatin  (CRESTOR ) 20 MG tablet Take 20 mg by mouth in the morning.    [provider]  traMADol  (ULTRAM ) 50 MG tablet Take 1 tablet (50 mg total) by mouth every 12 (twelve) hours as needed. 11/15/23   Lanny Callander, MD  trolamine salicylate (ASPERCREME) 10 % cream Apply 1 application topically as needed for muscle pain.    [provider]    Scheduled Meds:  Continuous Infusions:  PRN Meds:   Allergies:    Allergies  Allergen Reactions   Bactrim [Sulfamethoxazole-Trimethoprim] Swelling    Social History:   Social History   Socioeconomic History   Marital status: Widowed    Spouse name: Not on file   Number of children: 3   Years of education: Not on file   Highest education level: Not on file  Occupational History   Not on file  Tobacco Use   Smoking status: Never   Smokeless tobacco: Never  Vaping Use   Vaping status:  Never Used  Substance and Sexual Activity   Alcohol use: Yes    Comment: wine rarely   Drug use: No   Sexual activity: Not on file  Other Topics Concern   Not on file  Social History Narrative   Right handed   Drinks caffeine   One story home   Social Drivers of Health   Financial Resource Strain: Low Risk  (12/14/2023)   Received from [facility]   Overall Financial Resource Strain (CARDIA)    Difficulty of Paying Living Expenses: Not hard at all  Food Insecurity: No Food Insecurity (07/12/2024)   Hunger Vital Sign    Worried About Running Out of Food in the Last Year: Never true    Ran Out of Food in the Last Year: Never true  Transportation Needs: No Transportation Needs (07/12/2024)   PRAPARE - Administrator, Civil Service (Medical): No  Lack of Transportation (Non-Medical): No  Physical Activity: Insufficiently Active (12/14/2023)   Received from [facility]   Exercise Vital Sign    On average, how many days per week do you engage in moderate to strenuous exercise (like a brisk walk)?: 1 day    On average, how many minutes do you engage in exercise at this level?: 10 min  Stress: Stress Concern Present (12/14/2023)   Received from [facility] - Occupational Stress Questionnaire    Feeling of Stress : To some extent  Social Connections: Patient Unable To Answer (12/29/2023)   Social Connection and Isolation Panel    Frequency of Communication with Friends and Family: Patient unable to answer    Frequency of Social Gatherings with Friends and Family: Patient unable to answer    Attends Religious Services: Patient unable to answer    Active Member of Clubs or Organizations: Patient unable to answer    Attends Banker Meetings: Patient unable to answer    Marital Status: Patient unable to answer  Intimate Partner Violence: Not At Risk (07/12/2024)   Humiliation, Afraid, Rape, and Kick questionnaire     Fear of Current or Ex-Partner: No    Emotionally Abused: No    Physically Abused: No    Sexually Abused: No    Family History:    Family History  Problem Relation Age of Onset   Stroke Mother    Cancer Mother        brain   Heart disease Father    Heart disease Brother    Breast cancer Niece      ROS:  Please see the history of present illness.  All other ROS reviewed and negative.     Physical Exam/Data: Vitals:   08/09/24 2300 08/10/24 0115 08/10/24 0135 08/10/24 0230  BP: (!) 147/87 124/78  118/80  Pulse: (!) 57 (!) 58  (!) 54  Resp: 18 16  19   Temp:   97.7 F (36.5 C)   TempSrc:   Oral   SpO2: 94% 94%  92%   No intake or output data in the 24 hours ending 08/10/24 0400    06/04/2024    4:57 PM 04/30/2024    1:24 PM 04/19/2024   11:43 AM  Last 3 Weights  Weight (lbs) 177 lb 11.1 oz 176 lb 179 lb 3.2 oz  Weight (kg) 80.6 kg 79.833 kg 81.285 kg     There is no height or weight on file to calculate BMI.  General:  Well nourished, well developed, in no acute distress HEENT: normal Neck: no JVD Vascular: Distal pulses 2+ bilaterally Cardiac:  normal S1, S2; RRR; no murmur  Lungs:  clear to auscultation bilaterally, no wheezing, rhonchi or rales  Abd: soft, nontender, no hepatomegaly  Ext: no edema Musculoskeletal:  No deformities, BUE and BLE strength normal and equal Skin: warm and dry  Neuro:  CNs 2-12 intact, no focal abnormalities noted Psych:  Normal affect   EKG:  The EKG was personally reviewed and demonstrates:  sinus rhythm with no acute ischemic changes.  Relevant CV Studies: LHC 12/2023:   Ost Cx lesion is 50% stenosed.   Dist LM to Ost LAD lesion is 30% stenosed.   Prox RCA lesion is 70% stenosed.   LV end diastolic pressure is normal.   Moderate single vessel CAD with segmental stenosis in the proximal RCA Normal LVEDP   Plan: given history and atypical presentation I would  recommend medical management. If she were to have refractory angina  despite optimal medical therapy could consider PCI of the RCA   TTE 12/2023:  1. Left ventricular ejection fraction, by estimation, is 60 to 65%. The  left ventricle has normal function. The left ventricle has no regional  wall motion abnormalities. There is mild left ventricular hypertrophy.  Left ventricular diastolic parameters  were normal.   2. Right ventricular systolic function is normal. The right ventricular  size is normal. There is normal pulmonary artery systolic pressure.   3. Trivial mitral valve regurgitation.   4. The aortic valve is tricuspid. Aortic valve regurgitation is not  visualized. Aortic valve sclerosis/calcification is present, without any  evidence of aortic stenosis.   Laboratory Data: High Sensitivity Troponin:   Recent Labs  Lab 08/09/24 2202 08/10/24 0032  TROPONINIHS 8 24*     Chemistry Recent Labs  Lab 08/09/24 2202  NA 137  K 3.4*  CL 101  CO2 25  GLUCOSE 226*  BUN 11  CREATININE 0.98  CALCIUM  9.0  GFRNONAA >60  ANIONGAP 11    No results for input(s): PROT, ALBUMIN, AST, ALT, ALKPHOS, BILITOT in the last 168 hours. Lipids No results for input(s): CHOL, TRIG, HDL, LABVLDL, LDLCALC, CHOLHDL in the last 168 hours.  Hematology Recent Labs  Lab 08/09/24 2202  WBC 5.1  RBC 4.34  HGB 12.9  HCT 39.3  MCV 90.6  MCH 29.7  MCHC 32.8  RDW 12.4  PLT 284   Thyroid  No results for input(s): TSH, FREET4 in the last 168 hours.  BNPNo results for input(s): BNP, PROBNP in the last 168 hours.  DDimer No results for input(s): DDIMER in the last 168 hours.  Radiology/Studies:  DG Chest Port 1 View Result Date: 08/09/2024 EXAM: 1 VIEW(S) XRAY OF THE CHEST 08/09/2024 09:50:00 PM COMPARISON: Comparison with 12/29/2023. CLINICAL HISTORY: cp FINDINGS: LUNGS AND PLEURA: No focal pulmonary opacity. No pleural effusion. No pneumothorax. HEART AND MEDIASTINUM: No acute abnormality of the cardiac and mediastinal  silhouettes. BONES AND SOFT TISSUES: No acute osseous abnormality. IMPRESSION: 1. No acute cardiopulmonary process. Electronically signed by: Elsie Gravely MD 08/09/2024 09:56 PM EST RP Workstation: [id number]     Assessment and Plan: Chest pain Unstable angina vs NSTEMI Presents with chest pain similar to chest pain with her prior NSTEMI in 12/2023. Initial troponin was 8 and subsequent one is 24. Third troponin is pending. EKG with no acute ischemic changes. Presentation is concerning for ACS. - S/P ASA 325 mg with EMS; continue 81 mg daily  - Continue Plavix  75 mg daily  - Start heparin  gtt - TTE - Keep NPO for now   Risk Assessment/Risk Scores:    TIMI Risk Score for Unstable Angina or Non-ST Elevation MI:   The patient's TIMI risk score is 4, which indicates a 20% risk of all cause mortality, new or recurrent myocardial infarction or need for urgent revascularization in the next 14 days.         For questions or updates, please contact [facility] Please consult [email or web address] for contact info under      Signed, Gillian CHRISTELLA Cass, MD  08/10/2024 4:00 AM

## 2024-08-10 NOTE — Progress Notes (Signed)
 PHARMACY - ANTICOAGULATION CONSULT NOTE  Pharmacy Consult for Heparin  Indication: chest pain/ACS  Allergies  Allergen Reactions   Bactrim [Sulfamethoxazole-Trimethoprim] Swelling    Patient Measurements:    Vital Signs: Temp: 97.7 F (36.5 C) (11/28 0135) Temp Source: Oral (11/28 0135) BP: 118/80 (11/28 0230) Pulse Rate: 54 (11/28 0230)  Labs: Recent Labs    08/09/24 2202 08/10/24 0032 08/10/24 0307  HGB 12.9  --   --   HCT 39.3  --   --   PLT 284  --   --   CREATININE 0.98  --   --   TROPONINIHS 8 24* 97*    CrCl cannot be calculated (Unknown ideal weight.).   Medical History: Past Medical History:  Diagnosis Date   Coronary artery disease    Depression    GI bleed 07/17/2015   Hypertension    Myocardial infarction [facility])    Nocturia 08/14/2019   Refusal of blood transfusions as patient is Jehovah's Witness     Medications:  No current facility-administered medications on file prior to encounter.   Current Outpatient Medications on File Prior to Encounter  Medication Sig Dispense Refill   acetaminophen  (TYLENOL ) 650 MG CR tablet Take 650 mg by mouth every 8 (eight) hours as needed for pain.     aspirin  EC 81 MG tablet Take 1 tablet (81 mg total) by mouth daily. Swallow whole. 90 tablet 1   clopidogrel  (PLAVIX ) 75 MG tablet Take 1 tablet (75 mg total) by mouth daily. 90 tablet 1   cyclobenzaprine  (FLEXERIL ) 5 MG tablet Take 1 tablet (5 mg total) by mouth 3 (three) times daily as needed for muscle spasms. 30 tablet 0   DULoxetine  (CYMBALTA ) 60 MG capsule Take 60 mg by mouth in the morning.     fluticasone (FLONASE) 50 MCG/ACT nasal spray Place 1 spray into both nostrils as needed for allergies or rhinitis.     folic acid  (FOLVITE ) 800 MCG tablet Take 400 mcg by mouth in the morning.     gabapentin  (NEURONTIN ) 100 MG capsule TAKE 1 CAPSULE BY MOUTH AT BEDTIME. 30 capsule 1   isosorbide  mononitrate (IMDUR ) 30 MG 24 hr tablet Take 0.5 tablets (15 mg total) by  mouth daily. Can take a whole tablet if needed 90 tablet 1   losartan -hydrochlorothiazide  (HYZAAR) 50-12.5 MG tablet Take 1 tablet by mouth in the morning.     metFORMIN  (GLUCOPHAGE -XR) 500 MG 24 hr tablet Take 500 mg by mouth daily with breakfast.     Multiple Vitamins-Minerals (MULTIVITAMIN ADULTS 50+) TABS Take 1 tablet by mouth daily. (Patient not taking: Reported on 06/06/2024)     nitroGLYCERIN  (NITROSTAT ) 0.4 MG SL tablet Place 1 tablet (0.4 mg total) under the tongue every 5 (five) minutes as needed for chest pain. 30 tablet 0   ondansetron  (ZOFRAN ) 8 MG tablet Take 1 tablet (8 mg total) by mouth 2 (two) times daily as needed for nausea or vomiting. 20 tablet 0   pantoprazole  (PROTONIX ) 40 MG tablet Take 40 mg by mouth in the morning.     rosuvastatin  (CRESTOR ) 20 MG tablet Take 20 mg by mouth in the morning.     traMADol  (ULTRAM ) 50 MG tablet Take 1 tablet (50 mg total) by mouth every 12 (twelve) hours as needed. 20 tablet 0   trolamine salicylate (ASPERCREME) 10 % cream Apply 1 application topically as needed for muscle pain.       Assessment: 68 y.o. female with chest pain for heparin   Goal of  Therapy:  Heparin  level 0.3-0.7 units/ml Monitor platelets by anticoagulation protocol: Yes   Plan:  Heparin  4000 units IV bolus, then start heparin  1000 units/hr Check heparin  level in 6 hours.   Dail Cordella Misty 08/10/2024,4:37 AM

## 2024-08-11 DIAGNOSIS — Z859 Personal history of malignant neoplasm, unspecified: Secondary | ICD-10-CM | POA: Diagnosis not present

## 2024-08-11 DIAGNOSIS — E7800 Pure hypercholesterolemia, unspecified: Secondary | ICD-10-CM

## 2024-08-11 DIAGNOSIS — I2510 Atherosclerotic heart disease of native coronary artery without angina pectoris: Secondary | ICD-10-CM | POA: Diagnosis not present

## 2024-08-11 DIAGNOSIS — I214 Non-ST elevation (NSTEMI) myocardial infarction: Secondary | ICD-10-CM | POA: Diagnosis not present

## 2024-08-11 DIAGNOSIS — I2583 Coronary atherosclerosis due to lipid rich plaque: Secondary | ICD-10-CM

## 2024-08-11 DIAGNOSIS — R1010 Upper abdominal pain, unspecified: Secondary | ICD-10-CM | POA: Diagnosis not present

## 2024-08-11 LAB — CBC
HCT: 37.7 % (ref 36.0–46.0)
Hemoglobin: 12.5 g/dL (ref 12.0–15.0)
MCH: 29.2 pg (ref 26.0–34.0)
MCHC: 33.2 g/dL (ref 30.0–36.0)
MCV: 88.1 fL (ref 80.0–100.0)
Platelets: 275 K/uL (ref 150–400)
RBC: 4.28 MIL/uL (ref 3.87–5.11)
RDW: 12.8 % (ref 11.5–15.5)
WBC: 5.2 K/uL (ref 4.0–10.5)
nRBC: 0 % (ref 0.0–0.2)

## 2024-08-11 LAB — BASIC METABOLIC PANEL WITH GFR
Anion gap: 9 (ref 5–15)
BUN: 7 mg/dL — ABNORMAL LOW (ref 8–23)
CO2: 24 mmol/L (ref 22–32)
Calcium: 8.9 mg/dL (ref 8.9–10.3)
Chloride: 104 mmol/L (ref 98–111)
Creatinine, Ser: 0.98 mg/dL (ref 0.44–1.00)
GFR, Estimated: >60 mL/min (ref >60)
Glucose, Bld: 120 mg/dL — ABNORMAL HIGH (ref 70–99)
Potassium: 3.6 mmol/L (ref 3.5–5.1)
Sodium: 137 mmol/L (ref 135–145)

## 2024-08-11 LAB — GLUCOSE, CAPILLARY
Glucose-Capillary: 100 mg/dL — ABNORMAL HIGH (ref 70–99)
Glucose-Capillary: 110 mg/dL — ABNORMAL HIGH (ref 70–99)
Glucose-Capillary: 113 mg/dL — ABNORMAL HIGH (ref 70–99)
Glucose-Capillary: 114 mg/dL — ABNORMAL HIGH (ref 70–99)
Glucose-Capillary: 133 mg/dL — ABNORMAL HIGH (ref 70–99)
Glucose-Capillary: 200 mg/dL — ABNORMAL HIGH (ref 70–99)

## 2024-08-11 LAB — HEPARIN LEVEL (UNFRACTIONATED)
Heparin Unfractionated: 0.92 [IU]/mL — ABNORMAL HIGH (ref 0.30–0.70)
Heparin Unfractionated: 0.58 [IU]/mL (ref 0.30–0.70)
Heparin Unfractionated: 0.52 [IU]/mL (ref 0.30–0.70)

## 2024-08-11 LAB — MAGNESIUM: Magnesium: 2 mg/dL (ref 1.7–2.4)

## 2024-08-11 MED ORDER — FREE WATER
500.0000 mL | Freq: Once | Status: AC
  Administered 2024-08-13: 500 mL via ORAL

## 2024-08-11 NOTE — Plan of Care (Signed)

## 2024-08-11 NOTE — Progress Notes (Signed)
 PHARMACY - ANTICOAGULATION Pharmacy Consult for Heparin  Indication: chest pain/ACS Brief A/P: Heparin  level supratherapeutic Decrease Heparin  rate  Allergies  Allergen Reactions   Bactrim [Sulfamethoxazole-Trimethoprim] Swelling    Patient Measurements: Height: 5' 3 (160 cm) Weight: 76.9 kg (169 lb 8.5 oz) IBW/kg (Calculated) : 52.4 HEPARIN  DW (KG): 68.9  Vital Signs: Temp: 98.3 F (36.8 C) (11/28 2352) Temp Source: Oral (11/29 0338) BP: 127/95 (11/29 0338) Pulse Rate: 57 (11/29 0338)  Labs: Recent Labs    08/09/24 2202 08/10/24 0032 08/10/24 0307 08/10/24 0618 08/10/24 1216 08/11/24 0323  HGB 12.9  --   --   --   --  12.5  HCT 39.3  --   --   --   --  37.7  PLT 284  --   --   --   --  275  HEPARINUNFRC  --   --   --   --  0.71* 0.92*  CREATININE 0.98  --   --   --   --  0.98  TROPONINIHS 8 24* 97* 455*  --   --     Estimated Creatinine Clearance: 52.5 mL/min (by C-G formula based on SCr of 0.98 mg/dL).  Assessment: 68 y.o. female with chest pain for heparin   Goal of Therapy:  Heparin  level 0.3-0.7 units/ml Monitor platelets by anticoagulation protocol: Yes   Plan:  Decrease Heparin  750 units/hr Check heparin  level in 8 hours.   Tanya Walsh 08/11/2024,6:06 AM

## 2024-08-11 NOTE — Progress Notes (Signed)
 PHARMACY - ANTICOAGULATION Pharmacy Consult for Heparin  Indication: chest pain/ACS Brief A/P: Heparin  level supratherapeutic Decrease Heparin  rate  Allergies  Allergen Reactions   Bactrim [Sulfamethoxazole-Trimethoprim] Swelling    Patient Measurements: Height: 5' 3 (160 cm) Weight: 76.9 kg (169 lb 8.5 oz) IBW/kg (Calculated) : 52.4 HEPARIN  DW (KG): 68.9  Vital Signs: Temp: 97.6 F (36.4 C) (11/29 0734) Temp Source: Oral (11/29 0734) BP: 126/89 (11/29 0734) Pulse Rate: 60 (11/29 0734)  Labs: Recent Labs    08/09/24 2202 08/10/24 0032 08/10/24 0307 08/10/24 0618 08/10/24 1216 08/11/24 0323  HGB 12.9  --   --   --   --  12.5  HCT 39.3  --   --   --   --  37.7  PLT 284  --   --   --   --  275  HEPARINUNFRC  --   --   --   --  0.71* 0.92*  CREATININE 0.98  --   --   --   --  0.98  TROPONINIHS 8 24* 97* 455*  --   --     Estimated Creatinine Clearance: 52.5 mL/min (by C-G formula based on SCr of 0.98 mg/dL).  Assessment: 68 y.o. female presenting with chest pain found to have NSTEMI. Pt previously experienced NSTEMI in April. Pt taking ASA and Plavix  PTA.   Heparin  level therapeutic at 0.58 on 750 units/hr. Hgb 12.5 and PLT 275. Per RN, no issues with infusion and no signs of bleeding.   Goal of Therapy:  Heparin  level 0.3-0.7 units/ml Monitor platelets by anticoagulation protocol: Yes   Plan:  Continue heparin  infusion at 750 units/hr  Check heparin  level in 8 hours and daily while on heparin   Continue to monitor H&H and platelets   Thank you for allowing pharmacy to be a part of this patient's care   Prentice DOROTHA Favors, PharmD PGY1 Health-System Pharmacy Administration and Leadership Resident [facility]  08/11/2024 8:07 AM

## 2024-08-11 NOTE — Progress Notes (Signed)
 PHARMACY - ANTICOAGULATION Pharmacy Consult for Heparin  Indication: chest pain/ACS Brief A/P: Heparin  level within goal range Continue Heparin  at current rate    Allergies  Allergen Reactions   Bactrim [Sulfamethoxazole-Trimethoprim] Swelling    Patient Measurements: Height: 5' 3 (160 cm) Weight: 76.9 kg (169 lb 8.5 oz) IBW/kg (Calculated) : 52.4 HEPARIN  DW (KG): 68.9  Vital Signs: Temp: 98.2 F (36.8 C) (11/29 1940) Temp Source: Oral (11/29 1940) BP: 96/70 (11/29 1940) Pulse Rate: 82 (11/29 1940)  Labs: Recent Labs    08/09/24 2202 08/10/24 0032 08/10/24 0307 08/10/24 0618 08/10/24 1216 08/11/24 0323 08/11/24 1343 08/11/24 2210  HGB 12.9  --   --   --   --  12.5  --   --   HCT 39.3  --   --   --   --  37.7  --   --   PLT 284  --   --   --   --  275  --   --   HEPARINUNFRC  --   --   --   --    < > 0.92* 0.58 0.52  CREATININE 0.98  --   --   --   --  0.98  --   --   TROPONINIHS 8 24* 97* 455*  --   --   --   --    < > = values in this interval not displayed.    Estimated Creatinine Clearance: 52.5 mL/min (by C-G formula based on SCr of 0.98 mg/dL).  Assessment: 68 y.o. female with chest pain for heparin   Goal of Therapy:  Heparin  level 0.3-0.7 units/ml Monitor platelets by anticoagulation protocol: Yes   Plan:  No change to heparin    Delmar Dondero, Cordella Misty 08/11/2024,10:54 PM

## 2024-08-11 NOTE — Plan of Care (Signed)
  Problem: Coping: Goal: Ability to adjust to condition or change in health will improve Outcome: Progressing   Problem: Fluid Volume: Goal: Ability to maintain a balanced intake and output will improve Outcome: Progressing   Problem: Health Behavior/Discharge Planning: Goal: Ability to identify and utilize available resources and services will improve Outcome: Progressing Goal: Ability to manage health-related needs will improve Outcome: Progressing   Problem: Metabolic: Goal: Ability to maintain appropriate glucose levels will improve Outcome: Progressing   Problem: Nutritional: Goal: Maintenance of adequate nutrition will improve Outcome: Progressing Goal: Progress toward achieving an optimal weight will improve Outcome: Progressing   Problem: Skin Integrity: Goal: Risk for impaired skin integrity will decrease Outcome: Progressing   Problem: Tissue Perfusion: Goal: Adequacy of tissue perfusion will improve Outcome: Progressing   Problem: Activity: Goal: Ability to tolerate increased activity will improve Outcome: Progressing   Problem: Cardiac: Goal: Ability to achieve and maintain adequate cardiovascular perfusion will improve Outcome: Progressing   Problem: Clinical Measurements: Goal: Will remain free from infection Outcome: Progressing Goal: Diagnostic test results will improve Outcome: Progressing Goal: Respiratory complications will improve Outcome: Progressing Goal: Cardiovascular complication will be avoided Outcome: Progressing   Problem: Activity: Goal: Risk for activity intolerance will decrease Outcome: Progressing

## 2024-08-11 NOTE — Progress Notes (Addendum)
 Progress Note  Patient Name: Tanya Walsh Date of Encounter: 08/11/2024 [facility] Cardiologist: None   Interval Summary    Denies any CP or abdominal pain.  Had some mild SOB last night.   Vital Signs Vitals:   08/10/24 1955 08/10/24 2352 08/11/24 0338 08/11/24 0734  BP: (!) 149/80 118/78 (!) 127/95 126/89  Pulse: 62 60 (!) 57 60  Resp: 17 18 18 12   Temp: 97.9 F (36.6 C) 98.3 F (36.8 C)  97.6 F (36.4 C)  TempSrc: Oral Oral Oral Oral  SpO2: 95% 96% 98% 97%  Weight:      Height:        Intake/Output Summary (Last 24 hours) at 08/11/2024 0959 Last data filed at 08/11/2024 9373 Gross per 24 hour  Intake 250.75 ml  Output 1050 ml  Net -799.25 ml      08/10/2024    5:22 AM 06/04/2024    4:57 PM 04/30/2024    1:24 PM  Last 3 Weights  Weight (lbs) 169 lb 8.5 oz 177 lb 11.1 oz 176 lb  Weight (kg) 76.9 kg 80.6 kg 79.833 kg      Telemetry/ECG   NSR - Personally Reviewed  Physical Exam  GEN: Well nourished, well developed in no acute distress HEENT: Normal NECK: No JVD; No carotid bruits LYMPHATICS: No lymphadenopathy CARDIAC:RRR, no murmurs, rubs, gallops RESPIRATORY:  Clear to auscultation without rales, wheezing or rhonchi  ABDOMEN: Soft, non-tender, non-distended MUSCULOSKELETAL:  No edema; No deformity  SKIN: Warm and dry NEUROLOGIC:  Alert and oriented x 3 PSYCHIATRIC:  Normal affect   Assessment & Plan   68 y.o. female Jehovah witness with a hx of CAD NSTEMI 12/2023 (was treated medically) with prior MI in [place]  (details unknown), HTN, HLD, OSA, metastatic neuroendocrine tumor of small intestine, history of GIB who is being seen 08/10/2024 for the evaluation of chest pain => with troponins over 400 consistent with mild non-STEMI  NSTEMI Known CAD-with angina -- [facility] 12/2023 50% ostial circumflex lesion with 30% distal left main to ostial LAD lesion and 70% proximal RCA lesion treated medically with Plavix  and Imdur .  -- presented this  admission with chest pain last evening w/ associated diaphoresis and generalized weakness that improved with SL nitroglycerin   -- hsTn 8>>24>>97>>455, EKG on admission with slight j point elevation in lead II. Repeat EKG this morning no ST/T wave changes (consistent with non-STEMI) -- Denies any chest pain -- Continue aspirin  81 mg daily, Plavix  75 mg daily, Imdur  15 mg daily, IV heparin  drip and statin therapy. -- 2D echo 08/10/2024: EF 60 to 65% with no focal wall motion abnormalities, normal diastolic function, mild MR -- Initially patient gave some history of bladder pain along with urinary frequency and dysuria. Given her CA hx, would anticipate the need for CT prior to cath.  -- CT of the abdomen and pelvis on 08/10/2024 showed new nodules in the central small bowel mesentery measuring up to 10 mm suspicious for peritoneal or lymph node metastasis and a stable small metastatic lesion in the posterior right hepatic lobe with 1 cm left adrenal nodule. -- UA negative, CBC normal -- Will make n.p.o. after midnight on 11/30 for cath with possible PCI on Monday 12/1 -- Informed Consent   Shared Decision Making/Informed Consent The risks [stroke (1 in 1000), death (1 in 1000), kidney failure [usually temporary] (1 in 500), bleeding (1 in 200), allergic reaction [possibly serious] (1 in 200)], benefits (diagnostic support and management of coronary artery  disease) and alternatives of a cardiac catheterization were discussed in detail with Tanya Walsh and she is willing to proceed.    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 Lanny  -- of note, she was started on Lutathera  and shortly afterwards experienced NSTEMI with severe hyperglycemia and treatment was stopped.  -- CT abd/pelvis 12/2023 noted re-demonstration of ill-defined hypoattenuating lesion  in the right hepatic lobe along with stable 8 x 10 mm left adrenal nodule -- PET scan on April 05, 2024 showed improved peritoneal metastasis, and similar hypermetabolic liver met. No new lesions --CT of the abdomen pelvis 08/02/2024 showed new nodules in the central small bowel mesentery measuring up to 10 mm suspicious for peritoneal or lymph node metastasis and a stable metastatic lesion to posterior hepatic lobe and 1 cm left adrenal nodule -- reports lower abd pain, also urinary frequency some dysuria as well  -- UA is negative and CBC normal -- management per primary   HTN -- BP stable at 126/89 mmHg -- Continue HCTZ 12.5 mg daily, Imdur  15 mg daily, losartan  50 mg daily  Otherwise management per primary   I spent 35 minutes caring for this patient today face to face, ordering and reviewing labs, reviewing records from 2D echo 08/10/2024 and CT of ABd/Pelvis, seeing the patient, documenting in the record, and arranging for a cardiac cath 12/1 and reviewing risks and benefits of cath with patient.  For questions or updates, please contact [facility] Please consult [email or web address] for contact info under   Signed, Wilbert Bihari, MD [facility] 08/11/2024 10:15AM

## 2024-08-11 NOTE — Progress Notes (Signed)
   08/11/24 2223  BiPAP/CPAP/SIPAP  $ Face Mask Medium Yes  BiPAP/CPAP/SIPAP Pt Type Adult  BiPAP/CPAP/SIPAP DREAMSTATIOND  Reason BIPAP/CPAP not in use Non-compliant (Pt unabe to tolerate cpap.)

## 2024-08-11 NOTE — Progress Notes (Signed)
 Progress Note   Patient: Tanya Walsh FMW:[id number] DOB: 01-17-1956 DOA: 08/09/2024     1 DOS: the patient was seen and examined on 08/11/2024   Brief hospital course: HPI: Tanya Walsh is a 68 y.o. female with medical history significant for hypertension, type 2 diabetes mellitus, CAD, and neuroendocrine tumor with metastases who presents with chest pain.   Patient reports that she was in her usual state of health and had been having a good day when she developed chest pain yesterday evening.  She had just returned home and changed her close when she developed chest pressure with diaphoresis and generalized weakness shortly before 4 PM.  Symptoms began to improve after laying down, she went to sleep, but then woke with the same symptoms.  She reports that the symptoms are the same as what she experienced in April when she was admitted with NSTEMI, though not as severe as they were then.   She was treated with nitroglycerin  x 1 and 324 mg aspirin  by EMS prior to arrival in the ED.  She reports that the symptoms improved with nitroglycerin .   ED Course: Upon arrival to the ED, patient is found to be afebrile and saturating mid 90s on room air with heart rate in the 50s and stable blood pressure.  Labs are most notable for potassium 3.4, glucose 226, normal CBC, and troponin of 8 which increased to 24 and then 97.  EKG demonstrates sinus rhythm and chest x-ray is negative for acute findings.   Cardiology was consulted by the ED physician, aspirin  and fentanyl  were administered, and the patient was started on IV heparin  infusion.  Assessment and Plan: 1. NSTEMI/ known CAD with angina - Appreciate cardiology consultation  - Treated with ASA 324 mg prior to arrival  - Continue IV heparin , ASA 81 mg, Plavix  75 mg,Imdur   and Crestor , continue cardiac monitoring, trend troponin, cardiology planned left heart catheterization possibly Monday morning. - Echocardiogram was done 11/28, showed  normal ejection fraction and no wall motion abnormalities   2. Type II DM  - A1c was 7.5% in April 2025  - Check CBGs and use low-intensity SSI for now     3. Hypertension - Blood pressure is stable at 126/89 - Continue Hyzaar 50 mg p.o. daily, HCTZ 12.5 mg p.o. daily and Imdur  15 mg p.o. daily - Continue to monitor blood pressure   4. OSA  - CPAP while sleeping     5. 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 Lanny - No leukocytosis, UA is negative, no concern for acute infection at this point -Follow-up with primary oncologist as outpatient when she gets better from the hospital.      Subjective: Feels better denies any abdominal pain nausea vomiting  Physical Exam: Vitals:   08/10/24 2352 08/11/24 0338 08/11/24 0734 08/11/24 1118  BP: 118/78 (!) 127/95 126/89 138/76  Pulse: 60 (!) 57 60 60  Resp: 18 18 12 12   Temp: 98.3 F (36.8 C)  97.6 F (36.4 C) 97.9 F (36.6 C)  TempSrc: Oral Oral Oral Oral  SpO2: 96% 98% 97% 97%  Weight:      Height:       Constitutional: Alert, awake, calm, comfortable HEENT: Neck supple Respiratory: clear to auscultation bilaterally, no wheezing, no crackles. Normal respiratory effort. No accessory muscle use.  Cardiovascular: Regular rate and rhythm, no murmurs / rubs /  gallops. No extremity edema. 2+ pedal pulses. No carotid bruits.  Abdomen: no tenderness, no masses palpated. No hepatosplenomegaly. Bowel sounds positive.  Musculoskeletal: no clubbing / cyanosis. No joint deformity upper and lower extremities. Good ROM, no contractures. Normal muscle tone.  Skin: no rashes, lesions, ulcers. No induration Neurologic: CN 2-12 grossly intact. Sensation intact, DTR normal. Strength 5/5 x all 4 extremities.  Psychiatric: Normal judgment and insight. Alert and oriented x 3. Normal mood.   Data  Reviewed:  Reviewed  Family Communication: None  Disposition: Status is: Inpatient Remains inpatient appropriate because: Ongoing recovery from cardiac chest pain  Planned Discharge Destination: Home    Time spent: 35 minutes  Author: Nena Rebel, MD 08/11/2024 12:36 PM  For on call review [email or web address].

## 2024-08-12 DIAGNOSIS — Z859 Personal history of malignant neoplasm, unspecified: Secondary | ICD-10-CM | POA: Diagnosis not present

## 2024-08-12 DIAGNOSIS — I2510 Atherosclerotic heart disease of native coronary artery without angina pectoris: Secondary | ICD-10-CM | POA: Diagnosis not present

## 2024-08-12 DIAGNOSIS — I214 Non-ST elevation (NSTEMI) myocardial infarction: Secondary | ICD-10-CM | POA: Diagnosis not present

## 2024-08-12 DIAGNOSIS — R1010 Upper abdominal pain, unspecified: Secondary | ICD-10-CM | POA: Diagnosis not present

## 2024-08-12 LAB — CBC
HCT: 38.3 % (ref 36.0–46.0)
Hemoglobin: 12.9 g/dL (ref 12.0–15.0)
MCH: 29.4 pg (ref 26.0–34.0)
MCHC: 33.7 g/dL (ref 30.0–36.0)
MCV: 87.2 fL (ref 80.0–100.0)
Platelets: 274 K/uL (ref 150–400)
RBC: 4.39 MIL/uL (ref 3.87–5.11)
RDW: 12.7 % (ref 11.5–15.5)
WBC: 5.2 K/uL (ref 4.0–10.5)
nRBC: 0 % (ref 0.0–0.2)

## 2024-08-12 LAB — BASIC METABOLIC PANEL WITH GFR
Anion gap: 11 (ref 5–15)
BUN: 12 mg/dL (ref 8–23)
CO2: 24 mmol/L (ref 22–32)
Calcium: 8.9 mg/dL (ref 8.9–10.3)
Chloride: 100 mmol/L (ref 98–111)
Creatinine, Ser: 1.07 mg/dL — ABNORMAL HIGH (ref 0.44–1.00)
GFR, Estimated: 56 mL/min — ABNORMAL LOW (ref >60)
Glucose, Bld: 131 mg/dL — ABNORMAL HIGH (ref 70–99)
Potassium: 3.4 mmol/L — ABNORMAL LOW (ref 3.5–5.1)
Sodium: 135 mmol/L (ref 135–145)

## 2024-08-12 LAB — GLUCOSE, CAPILLARY
Glucose-Capillary: 123 mg/dL — ABNORMAL HIGH (ref 70–99)
Glucose-Capillary: 120 mg/dL — ABNORMAL HIGH (ref 70–99)
Glucose-Capillary: 106 mg/dL — ABNORMAL HIGH (ref 70–99)
Glucose-Capillary: 125 mg/dL — ABNORMAL HIGH (ref 70–99)
Glucose-Capillary: 218 mg/dL — ABNORMAL HIGH (ref 70–99)

## 2024-08-12 LAB — HEPARIN LEVEL (UNFRACTIONATED): Heparin Unfractionated: 0.55 [IU]/mL (ref 0.30–0.70)

## 2024-08-12 LAB — MAGNESIUM: Magnesium: 1.8 mg/dL (ref 1.7–2.4)

## 2024-08-12 MED ORDER — POTASSIUM CHLORIDE CRYS ER 20 MEQ PO TBCR: 20.0000 meq | EXTENDED_RELEASE_TABLET | Freq: Once | ORAL | Status: DC

## 2024-08-12 MED ORDER — POTASSIUM CHLORIDE 10 MEQ/100ML IV SOLN
10.0000 meq | INTRAVENOUS | Status: DC
  Administered 2024-08-12: 10 meq via INTRAVENOUS
  Filled 2024-08-12: qty 100

## 2024-08-12 MED ORDER — FREE WATER
500.0000 mL | Freq: Once | Status: AC
  Administered 2024-08-13: 500 mL via ORAL

## 2024-08-12 NOTE — Plan of Care (Signed)
  Problem: Coping: Goal: Ability to adjust to condition or change in health will improve Outcome: Progressing   Problem: Fluid Volume: Goal: Ability to maintain a balanced intake and output will improve Outcome: Progressing   Problem: Health Behavior/Discharge Planning: Goal: Ability to identify and utilize available resources and services will improve Outcome: Progressing Goal: Ability to manage health-related needs will improve Outcome: Progressing   Problem: Metabolic: Goal: Ability to maintain appropriate glucose levels will improve Outcome: Progressing   Problem: Nutritional: Goal: Maintenance of adequate nutrition will improve Outcome: Progressing   Problem: Skin Integrity: Goal: Risk for impaired skin integrity will decrease Outcome: Progressing   Problem: Tissue Perfusion: Goal: Adequacy of tissue perfusion will improve Outcome: Progressing   Problem: Activity: Goal: Ability to tolerate increased activity will improve Outcome: Progressing   Problem: Cardiac: Goal: Ability to achieve and maintain adequate cardiovascular perfusion will improve Outcome: Progressing   Problem: Education: Goal: Knowledge of General Education information will improve Description: Including pain rating scale, medication(s)/side effects and non-pharmacologic comfort measures Outcome: Progressing   Problem: Clinical Measurements: Goal: Will remain free from infection Outcome: Progressing Goal: Diagnostic test results will improve Outcome: Progressing Goal: Respiratory complications will improve Outcome: Progressing Goal: Cardiovascular complication will be avoided Outcome: Progressing

## 2024-08-12 NOTE — Progress Notes (Addendum)
 Progress Note  Patient Name: Tanya Walsh Date of Encounter: 08/12/2024 [facility] Cardiologist: None   Interval Summary    Had some mild CP and lower abdominal pain overnight but currently asymptomatic  Vital Signs Vitals:   08/11/24 1940 08/11/24 2350 08/12/24 0403 08/12/24 0732  BP: 96/70 115/72 (!) 117/92 97/68  Pulse: 82 74 70 67  Resp: 17 18 18 17   Temp: 98.2 F (36.8 C) 98.5 F (36.9 C) 98.1 F (36.7 C) 98.3 F (36.8 C)  TempSrc: Oral Oral Oral Oral  SpO2: 95% 98% 98% 99%  Weight:      Height:        Intake/Output Summary (Last 24 hours) at 08/12/2024 0916 Last data filed at 08/12/2024 9261 Gross per 24 hour  Intake --  Output 400 ml  Net -400 ml      08/10/2024    5:22 AM 06/04/2024    4:57 PM 04/30/2024    1:24 PM  Last 3 Weights  Weight (lbs) 169 lb 8.5 oz 177 lb 11.1 oz 176 lb  Weight (kg) 76.9 kg 80.6 kg 79.833 kg      Telemetry/ECG   Normal sinus rhythm- Personally Reviewed  Physical Exam  GEN: Well nourished, well developed in no acute distress HEENT: Normal NECK: No JVD; No carotid bruits LYMPHATICS: No lymphadenopathy CARDIAC:RRR, no murmurs, rubs, gallops RESPIRATORY:  Clear to auscultation without rales, wheezing or rhonchi  ABDOMEN: Soft, non-tender, non-distended MUSCULOSKELETAL:  No edema; No deformity  SKIN: Warm and dry NEUROLOGIC:  Alert and oriented x 3 PSYCHIATRIC:  Normal affect    Assessment & Plan   68 y.o. female Tanya Walsh with a hx of CAD NSTEMI 12/2023 (was treated medically) with prior MI in [place]  (details unknown), HTN, HLD, OSA, metastatic neuroendocrine tumor of small intestine, history of GIB who is being seen 08/10/2024 for the evaluation of chest pain => with troponins over 400 consistent with mild non-STEMI  NSTEMI Known CAD-with angina -- [facility] 12/2023 50% ostial circumflex lesion with 30% distal left main to ostial LAD lesion and 70% proximal RCA lesion treated medically with Plavix  and  Imdur .  -- presented this admission with chest pain last evening w/ associated diaphoresis and generalized weakness that improved with SL nitroglycerin   -- hsTn 8>>24>>97>>455, EKG on admission with slight j point elevation in lead II. Repeat EKG this morning no ST/T wave changes (consistent with non-STEMI) -- No further chest pain or abdominal pain since admission -- 2D echo 08/10/2024: EF 60 to 65% with no focal wall motion abnormalities, normal diastolic function, mild MR -- Initially patient gave some history of bladder pain along with urinary frequency and dysuria.  -- CT of the abdomen and pelvis on 08/10/2024 showed new nodules in the central small bowel mesentery measuring up to 10 mm suspicious for peritoneal or lymph node metastasis and a stable small metastatic lesion in the posterior right hepatic lobe with 1 cm left adrenal nodule. -- UA negative, CBC normal -- She is n.p.o. for left heart cath with possible PCI in the morning --Informed Consent   Shared Decision Making/Informed Consent The risks [stroke (1 in 1000), death (1 in 1000), kidney failure [usually temporary] (1 in 500), bleeding (1 in 200), allergic reaction [possibly serious] (1 in 200)], benefits (diagnostic support and management of coronary artery disease) and alternatives of a cardiac catheterization were discussed in detail with Tanya Walsh and she is willing to proceed. 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 Lanny  -- of note, she was started on Lutathera  and shortly afterwards experienced NSTEMI with severe hyperglycemia and treatment was stopped.  -- CT abd/pelvis 12/2023 noted re-demonstration of ill-defined hypoattenuating lesion in the right hepatic lobe along with  stable 8 x 10 mm left adrenal nodule -- PET scan on April 05, 2024 showed improved peritoneal metastasis, and similar hypermetabolic liver met. No new lesions --CT of the abdomen pelvis 08/02/2024 showed new nodules in the central small bowel mesentery measuring up to 10 mm suspicious for peritoneal or lymph node metastasis and a stable metastatic lesion to posterior hepatic lobe and 1 cm left adrenal nodule -- reports lower abd pain, also urinary frequency some dysuria as well  -- UA is negative and CBC normal -- management per TRH and oncology  HTN -- BP soft this a.m. at 97/68 mmHg -- Will hold losartan  and HCTZ today and tomorrow a.m. especially in light of cardiac cath tomorrow  Otherwise management per primary   I spent 35 minutes caring for this patient today face to face, ordering and reviewing labs, reviewing records from 2D echo 08/10/2024 and CT of ABd/Pelvis, seeing the patient, documenting in the record, and arranging for a cardiac cath 12/1 and reviewing risks and benefits of cath with patient.  For questions or updates, please contact [facility] Please consult [email or web address] for contact info under   Signed, Wilbert Bihari, MD [facility] 08/11/2024 10:15AM

## 2024-08-12 NOTE — Progress Notes (Signed)
 Progress Note   Patient: Tanya Walsh FMW:[id number] DOB: 10/14/1955 DOA: 08/09/2024     2 DOS: the patient was seen and examined on 08/12/2024   Brief hospital course: HPI: Tanya Walsh is a 68 y.o. female with medical history significant for hypertension, type 2 diabetes mellitus, CAD, and neuroendocrine tumor with metastases who presents with chest pain.   Patient reports that she was in her usual state of health and had been having a good day when she developed chest pain yesterday evening.  She had just returned home and changed her close when she developed chest pressure with diaphoresis and generalized weakness shortly before 4 PM.  Symptoms began to improve after laying down, she went to sleep, but then woke with the same symptoms.  She reports that the symptoms are the same as what she experienced in April when she was admitted with NSTEMI, though not as severe as they were then.   She was treated with nitroglycerin  x 1 and 324 mg aspirin  by EMS prior to arrival in the ED.  She reports that the symptoms improved with nitroglycerin .   ED Course: Upon arrival to the ED, patient is found to be afebrile and saturating mid 90s on room air with heart rate in the 50s and stable blood pressure.  Labs are most notable for potassium 3.4, glucose 226, normal CBC, and troponin of 8 which increased to 24 and then 97.  EKG demonstrates sinus rhythm and chest x-ray is negative for acute findings.   Cardiology was consulted by the ED physician, aspirin  and fentanyl  were administered, and the patient was started on IV heparin  infusion.  Cardiology planned for left heart catheterization for 08/13/2024  Assessment and Plan:  1. NSTEMI/ known CAD with angina - Appreciate cardiology consultation  - Treated with ASA 324 mg prior to arrival  - Continue IV heparin , ASA 81 mg, Plavix  75 mg,Imdur   and Crestor , continue cardiac monitoring, trend troponin, cardiology planned left heart catheterization  possibly Monday morning 12/1. - Echocardiogram was done 11/28, showed normal ejection fraction and no wall motion abnormalities  2. Hypokalemia -Replaced potassium and repeat K in AM   3. Type II DM  - A1c was 7.5% in April 2025  - Check CBGs and use low-intensity SSI for now     4. Hypertension - Blood pressure is stable at 126/89 - Continue Hyzaar 50 mg p.o. daily, HCTZ 12.5 mg p.o. daily and Imdur  15 mg p.o. daily - Continue to monitor blood pressure   5. OSA  - CPAP while sleeping     6. 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 Lanny -No leukocytosis, UA is negative, no concern for acute infection at this point -Follow-up with primary oncologist as outpatient when she gets better from the hospital.       Subjective: No fever no chills no nausea no vomiting  Physical Exam: Vitals:   08/11/24 2350 08/12/24 0403 08/12/24 0732 08/12/24 1109  BP: 115/72 (!) 117/92 97/68 111/69  Pulse: 74 70 67 79  Resp: 18 18 17 18   Temp: 98.5 F (36.9 C) 98.1 F (36.7 C) 98.3 F (36.8 C) 98.3 F (36.8 C)  TempSrc: Oral Oral Oral Oral  SpO2: 98% 98% 99% 92%  Weight:      Height:       Constitutional: Alert, awake, calm, comfortable HEENT: Neck supple Respiratory: clear to auscultation bilaterally,  no wheezing, no crackles. Normal respiratory effort. No accessory muscle use.  Cardiovascular: Regular rate and rhythm, no murmurs / rubs / gallops. No extremity edema. 2+ pedal pulses. No carotid bruits.  Abdomen: no tenderness, no masses palpated. No hepatosplenomegaly. Bowel sounds positive.  Musculoskeletal: no clubbing / cyanosis. No joint deformity upper and lower extremities. Good ROM, no contractures. Normal muscle tone.  Skin: no rashes, lesions, ulcers. No induration Neurologic: CN 2-12 grossly intact. Sensation intact, DTR normal. Strength 5/5 x  all 4 extremities.  Psychiatric: Normal judgment and insight. Alert and oriented x 3. Normal mood.   Data Reviewed:  Potassium 3.4  Family Communication: None  Disposition: Status is: Inpatient Remains inpatient appropriate because: Ongoing recovery from acute non-ST elevation myocardial infarction requiring cardiac catheterization  Planned Discharge Destination: Home    Time spent: 35 minutes  Author: Nena Rebel, MD 08/12/2024 2:06 PM  For on call review [email or web address].

## 2024-08-12 NOTE — Plan of Care (Signed)

## 2024-08-12 NOTE — Progress Notes (Signed)
 PHARMACY - ANTICOAGULATION Pharmacy Consult for Heparin  Indication: chest pain/ACS Brief A/P: Heparin  level supratherapeutic Decrease Heparin  rate  Allergies  Allergen Reactions   Bactrim [Sulfamethoxazole-Trimethoprim] Swelling    Patient Measurements: Height: 5' 3 (160 cm) Weight: 76.9 kg (169 lb 8.5 oz) IBW/kg (Calculated) : 52.4 HEPARIN  DW (KG): 68.9  Vital Signs: Temp: 98.3 F (36.8 C) (11/30 0732) Temp Source: Oral (11/30 0732) BP: 97/68 (11/30 0732) Pulse Rate: 67 (11/30 0732)  Labs: Recent Labs    08/09/24 2202 08/10/24 0032 08/10/24 0307 08/10/24 0618 08/10/24 1216 08/11/24 0323 08/11/24 1343 08/11/24 2210 08/12/24 0241  HGB 12.9  --   --   --   --  12.5  --   --  12.9  HCT 39.3  --   --   --   --  37.7  --   --  38.3  PLT 284  --   --   --   --  275  --   --  274  HEPARINUNFRC  --   --   --   --    < > 0.92* 0.58 0.52 0.55  CREATININE 0.98  --   --   --   --  0.98  --   --  1.07*  TROPONINIHS 8 24* 97* 455*  --   --   --   --   --    < > = values in this interval not displayed.    Estimated Creatinine Clearance: 48 mL/min (A) (by C-G formula based on SCr of 1.07 mg/dL (H)).  Assessment: 68 y.o. female presenting with chest pain found to have NSTEMI. Pt previously experienced NSTEMI in April. Pt taking ASA and Plavix  PTA.   Per cardiology, pt to undergo left heart cath with possible PCI on morning of 11/30.  Heparin  level therapeutic at 0.55 on 750 units/hr. Hgb 12.9 and PLT 274. Per RN, no issues with infusion and no signs of bleeding.   Goal of Therapy:  Heparin  level 0.3-0.7 units/ml Monitor platelets by anticoagulation protocol: Yes   Plan:  Continue heparin  infusion at 750 units/hr  Check heparin  level daily while on heparin   Continue to monitor H&H and platelets   Thank you for allowing pharmacy to be a part of this patient's care   Prentice DOROTHA Favors, PharmD PGY1 Health-System Pharmacy Administration and Leadership Resident [facility]  08/12/2024 9:46 AM

## 2024-08-13 ENCOUNTER — Encounter (HOSPITAL_COMMUNITY): Admission: EM | Disposition: A | Payer: Self-pay | Source: Home / Self Care | Attending: Hospitalist

## 2024-08-13 DIAGNOSIS — I214 Non-ST elevation (NSTEMI) myocardial infarction: Secondary | ICD-10-CM | POA: Diagnosis not present

## 2024-08-13 HISTORY — PX: CORONARY IMAGING/OCT: CATH118326

## 2024-08-13 HISTORY — PX: LEFT HEART CATH AND CORONARY ANGIOGRAPHY: CATH118249

## 2024-08-13 HISTORY — PX: CORONARY STENT INTERVENTION: CATH118234

## 2024-08-13 LAB — CBC
HCT: 40.3 % (ref 36.0–46.0)
Hemoglobin: 13.3 g/dL (ref 12.0–15.0)
MCH: 29.1 pg (ref 26.0–34.0)
MCHC: 33 g/dL (ref 30.0–36.0)
MCV: 88.2 fL (ref 80.0–100.0)
Platelets: 276 K/uL (ref 150–400)
RBC: 4.57 MIL/uL (ref 3.87–5.11)
RDW: 12.7 % (ref 11.5–15.5)
WBC: 4.9 K/uL (ref 4.0–10.5)
nRBC: 0 % (ref 0.0–0.2)

## 2024-08-13 LAB — BASIC METABOLIC PANEL WITH GFR
Anion gap: 12 (ref 5–15)
BUN: 12 mg/dL (ref 8–23)
CO2: 28 mmol/L (ref 22–32)
Calcium: 8.9 mg/dL (ref 8.9–10.3)
Chloride: 98 mmol/L (ref 98–111)
Creatinine, Ser: 1.01 mg/dL — ABNORMAL HIGH (ref 0.44–1.00)
GFR, Estimated: 60 mL/min — ABNORMAL LOW (ref >60)
Glucose, Bld: 130 mg/dL — ABNORMAL HIGH (ref 70–99)
Potassium: 3.6 mmol/L (ref 3.5–5.1)
Sodium: 138 mmol/L (ref 135–145)

## 2024-08-13 LAB — GLUCOSE, CAPILLARY
Glucose-Capillary: 103 mg/dL — ABNORMAL HIGH (ref 70–99)
Glucose-Capillary: 118 mg/dL — ABNORMAL HIGH (ref 70–99)
Glucose-Capillary: 125 mg/dL — ABNORMAL HIGH (ref 70–99)
Glucose-Capillary: 132 mg/dL — ABNORMAL HIGH (ref 70–99)
Glucose-Capillary: 132 mg/dL — ABNORMAL HIGH (ref 70–99)
Glucose-Capillary: 136 mg/dL — ABNORMAL HIGH (ref 70–99)
Glucose-Capillary: 151 mg/dL — ABNORMAL HIGH (ref 70–99)

## 2024-08-13 LAB — LIPID PANEL
Cholesterol: 195 mg/dL (ref 0–200)
HDL: 59 mg/dL (ref >40)
LDL Cholesterol: 116 mg/dL — ABNORMAL HIGH (ref 0–99)
Total CHOL/HDL Ratio: 3.3 ratio
Triglycerides: 98 mg/dL (ref <150)
VLDL: 20 mg/dL (ref 0–40)

## 2024-08-13 LAB — LIPOPROTEIN A (LPA): Lipoprotein (a): 96.6 nmol/L — ABNORMAL HIGH (ref <75.0)

## 2024-08-13 LAB — POCT ACTIVATED CLOTTING TIME
Activated Clotting Time: 204 s
Activated Clotting Time: 173 s
Activated Clotting Time: 194 s

## 2024-08-13 LAB — HEPARIN LEVEL (UNFRACTIONATED): Heparin Unfractionated: 0.44 [IU]/mL (ref 0.30–0.70)

## 2024-08-13 MED ORDER — NITROGLYCERIN 1 MG/10 ML FOR IR/CATH LAB
INTRA_ARTERIAL | Status: AC
  Filled 2024-08-13: qty 10

## 2024-08-13 MED ORDER — SODIUM CHLORIDE 0.9 % IV SOLN: 250.0000 mL | INTRAVENOUS | Status: DC | PRN

## 2024-08-13 MED ORDER — SODIUM CHLORIDE 0.9% FLUSH: 3.0000 mL | INTRAVENOUS | Status: DC | PRN

## 2024-08-13 MED ORDER — PHENYLEPHRINE 80 MCG/ML (10ML) SYRINGE FOR IV PUSH (FOR BLOOD PRESSURE SUPPORT)
PREFILLED_SYRINGE | INTRAVENOUS | Status: DC | PRN
  Administered 2024-08-13 (×2): 160 ug via INTRAVENOUS

## 2024-08-13 MED ORDER — NOREPINEPHRINE BITARTRATE 1 MG/ML IV SOLN
INTRAVENOUS | Status: DC | PRN
  Administered 2024-08-13: 10 ug/min via INTRAVENOUS

## 2024-08-13 MED ORDER — FENTANYL CITRATE (PF) 100 MCG/2ML IJ SOLN
INTRAMUSCULAR | Status: AC
  Filled 2024-08-13: qty 2

## 2024-08-13 MED ORDER — CLOPIDOGREL BISULFATE 300 MG PO TABS
ORAL_TABLET | ORAL | Status: AC
  Filled 2024-08-13: qty 1

## 2024-08-13 MED ORDER — SODIUM CHLORIDE 0.9 % IV SOLN: INTRAVENOUS | Status: AC

## 2024-08-13 MED ORDER — NOREPINEPHRINE 4 MG/250ML-% IV SOLN
INTRAVENOUS | Status: AC
  Filled 2024-08-13: qty 250

## 2024-08-13 MED ORDER — MIDAZOLAM HCL (PF) 2 MG/2ML IJ SOLN
INTRAMUSCULAR | Status: DC | PRN
  Administered 2024-08-13 (×2): 1 mg via INTRAVENOUS

## 2024-08-13 MED ORDER — ENOXAPARIN SODIUM 40 MG/0.4ML IJ SOSY
40.0000 mg | PREFILLED_SYRINGE | INTRAMUSCULAR | Status: DC
  Administered 2024-08-14: 40 mg via SUBCUTANEOUS
  Filled 2024-08-13: qty 0.4

## 2024-08-13 MED ORDER — ISOSORBIDE MONONITRATE ER 30 MG PO TB24
30.0000 mg | ORAL_TABLET | Freq: Every day | ORAL | Status: DC
  Administered 2024-08-14: 30 mg via ORAL
  Filled 2024-08-13: qty 1

## 2024-08-13 MED ORDER — LIDOCAINE HCL (PF) 1 % IJ SOLN
INTRAMUSCULAR | Status: AC
  Filled 2024-08-13: qty 30

## 2024-08-13 MED ORDER — LIDOCAINE HCL (PF) 1 % IJ SOLN
INTRAMUSCULAR | Status: DC | PRN
  Administered 2024-08-13: 5 mL
  Administered 2024-08-13: 2 mL

## 2024-08-13 MED ORDER — VERAPAMIL HCL 2.5 MG/ML IV SOLN
INTRAVENOUS | Status: AC
  Filled 2024-08-13: qty 2

## 2024-08-13 MED ORDER — VERAPAMIL HCL 2.5 MG/ML IV SOLN
INTRAVENOUS | Status: DC | PRN
  Administered 2024-08-13 (×2): 10 mL via INTRA_ARTERIAL

## 2024-08-13 MED ORDER — ATROPINE SULFATE 1 MG/10ML IJ SOSY
PREFILLED_SYRINGE | INTRAMUSCULAR | Status: DC | PRN
  Administered 2024-08-13: .5 mg via INTRAVENOUS

## 2024-08-13 MED ORDER — PHENYLEPHRINE 80 MCG/ML (10ML) SYRINGE FOR IV PUSH (FOR BLOOD PRESSURE SUPPORT)
PREFILLED_SYRINGE | INTRAVENOUS | Status: AC
  Filled 2024-08-13: qty 10

## 2024-08-13 MED ORDER — CLOPIDOGREL BISULFATE 300 MG PO TABS
ORAL_TABLET | ORAL | Status: DC | PRN
  Administered 2024-08-13: 300 mg via ORAL

## 2024-08-13 MED ORDER — IOHEXOL 350 MG/ML SOLN
INTRAVENOUS | Status: DC | PRN
  Administered 2024-08-13: 150 mL

## 2024-08-13 MED ORDER — ENSURE PLUS HIGH PROTEIN PO LIQD
237.0000 mL | Freq: Two times a day (BID) | ORAL | Status: DC
  Administered 2024-08-14: 237 mL via ORAL

## 2024-08-13 MED ORDER — MIDAZOLAM HCL 2 MG/2ML IJ SOLN
INTRAMUSCULAR | Status: AC
  Filled 2024-08-13: qty 2

## 2024-08-13 MED ORDER — LABETALOL HCL 5 MG/ML IV SOLN: 10.0000 mg | INTRAVENOUS | Status: AC | PRN

## 2024-08-13 MED ORDER — HYDRALAZINE HCL 20 MG/ML IJ SOLN: 10.0000 mg | INTRAMUSCULAR | Status: AC | PRN

## 2024-08-13 MED ORDER — HEPARIN SODIUM (PORCINE) 1000 UNIT/ML IJ SOLN
INTRAMUSCULAR | Status: AC
  Filled 2024-08-13: qty 10

## 2024-08-13 MED ORDER — HEPARIN SODIUM (PORCINE) 1000 UNIT/ML IJ SOLN
INTRAMUSCULAR | Status: DC | PRN
  Administered 2024-08-13: 4000 [IU] via INTRAVENOUS
  Administered 2024-08-13: 2000 [IU] via INTRAVENOUS
  Administered 2024-08-13: 3500 [IU] via INTRAVENOUS

## 2024-08-13 MED ORDER — NITROGLYCERIN 1 MG/10 ML FOR IR/CATH LAB
INTRA_ARTERIAL | Status: DC | PRN
  Administered 2024-08-13 (×2): 200 ug via INTRACORONARY
  Administered 2024-08-13 (×2): 100 ug via INTRACORONARY

## 2024-08-13 MED ORDER — MORPHINE SULFATE (PF) 2 MG/ML IV SOLN
INTRAVENOUS | Status: AC
  Filled 2024-08-13: qty 1

## 2024-08-13 MED ORDER — SODIUM CHLORIDE 0.9% FLUSH
3.0000 mL | Freq: Two times a day (BID) | INTRAVENOUS | Status: DC
  Administered 2024-08-14: 3 mL via INTRAVENOUS

## 2024-08-13 MED ORDER — FENTANYL CITRATE (PF) 100 MCG/2ML IJ SOLN
INTRAMUSCULAR | Status: DC | PRN
  Administered 2024-08-13 (×3): 25 ug via INTRAVENOUS

## 2024-08-13 MED ORDER — ATROPINE SULFATE 1 MG/10ML IJ SOSY
PREFILLED_SYRINGE | INTRAMUSCULAR | Status: AC
  Filled 2024-08-13: qty 10

## 2024-08-13 MED ORDER — ATROPINE SULFATE 1 MG/10ML IJ SOSY
PREFILLED_SYRINGE | INTRAMUSCULAR | Status: DC
  Filled 2024-08-13: qty 10

## 2024-08-13 MED ORDER — HEPARIN (PORCINE) IN NACL 1000-0.9 UT/500ML-% IV SOLN
INTRAVENOUS | Status: DC | PRN
  Administered 2024-08-13: 1000 mL
  Administered 2024-08-13: 500 mL

## 2024-08-13 MED ORDER — SODIUM CHLORIDE 0.9 % IV SOLN
INTRAVENOUS | Status: AC | PRN
  Administered 2024-08-13: 250 mL via INTRAVENOUS

## 2024-08-13 SURGICAL SUPPLY — 22 items
BALLOON EMERGE MR 2.5X12 (BALLOONS) IMPLANT
BALLOON ~~LOC~~ EMERGE MR 3.75X20 (BALLOONS) IMPLANT
CATH DRAGONFLY OPSTAR (CATHETERS) IMPLANT
CATH INFINITI 5 FR 3DRC (CATHETERS) IMPLANT
CATH INFINITI 5FR JL4 (CATHETERS) IMPLANT
CATH VISTA GUIDE 6FR JR4 ECOPK (CATHETERS) IMPLANT
DEVICE RAD TR BAND REGULAR (VASCULAR PRODUCTS) IMPLANT
ELECT DEFIB PAD ADLT CADENCE (PAD) IMPLANT
GLIDESHEATH SLEND SS 6F .021 (SHEATH) IMPLANT
GUIDEWIRE INQWIRE 1.5J.035X260 (WIRE) IMPLANT
KIT ENCORE 26 ADVANTAGE (KITS) IMPLANT
KIT ESSENTIALS PG (KITS) IMPLANT
KIT MICROPUNCTURE NIT STIFF (SHEATH) IMPLANT
PACK CARDIAC CATHETERIZATION (CUSTOM PROCEDURE TRAY) ×1 IMPLANT
SET ATX-X65L (MISCELLANEOUS) IMPLANT
SHEATH PINNACLE 5F 10CM (SHEATH) IMPLANT
SHEATH PINNACLE 6F 10CM (SHEATH) IMPLANT
SHEATH PROBE COVER 6X72 (BAG) IMPLANT
STATION PROTECTION PRESSURIZED (MISCELLANEOUS) IMPLANT
STENT ONYX FRONTIER 3.5X30 (Permanent Stent) IMPLANT
WIRE HI TORQ VERSACORE-J 145CM (WIRE) IMPLANT
WIRE RUNTHROUGH IZANAI 014 180 (WIRE) IMPLANT

## 2024-08-13 NOTE — Progress Notes (Signed)
 PHARMACY - ANTICOAGULATION Pharmacy Consult for Heparin  Indication: chest pain/ACS Brief A/P: Heparin  level supratherapeutic Decrease Heparin  rate  Allergies  Allergen Reactions   Bactrim [Sulfamethoxazole-Trimethoprim] Swelling    Patient Measurements: Height: 5' 3 (160 cm) Weight: 77 kg (169 lb 12.1 oz) IBW/kg (Calculated) : 52.4 HEPARIN  DW (KG): 68.9  Vital Signs: Temp: 98.2 F (36.8 C) (12/01 0433) Temp Source: Oral (12/01 0433) BP: 129/74 (12/01 0433) Pulse Rate: 60 (12/01 0433)  Labs: Recent Labs    08/11/24 0323 08/11/24 1343 08/11/24 2210 08/12/24 0241 08/13/24 0334  HGB 12.5  --   --  12.9 13.3  HCT 37.7  --   --  38.3 40.3  PLT 275  --   --  274 276  HEPARINUNFRC 0.92*   < > 0.52 0.55 0.44  CREATININE 0.98  --   --  1.07*  --    < > = values in this interval not displayed.    Estimated Creatinine Clearance: 48 mL/min (A) (by C-G formula based on SCr of 1.07 mg/dL (H)).  Assessment: 68 y.o. female presenting with chest pain found to have NSTEMI. Pt previously experienced NSTEMI in April. Pt taking ASA and Plavix  PTA.   Per cardiology, pt to undergo left heart cath with possible PCI on 12/1.   Heparin  level therapeutic at 0.44 on 750 units/hr. Hgb 13.3 and PLT 276. Per RN, no issues with infusion and no signs of bleeding.   Goal of Therapy:  Heparin  level 0.3-0.7 units/ml Monitor platelets by anticoagulation protocol: Yes   Plan:  Continue heparin  infusion at 750 units/hr  Check heparin  level daily while on heparin   Continue to monitor H&H and platelets   Thank you for allowing pharmacy to be a part of this patient's care   Dionicia Canavan, PharmD, RPh PGY1 Acute Care Pharmacy Resident [facility]   08/13/2024 6:55 AM

## 2024-08-13 NOTE — Plan of Care (Signed)

## 2024-08-13 NOTE — Progress Notes (Signed)
 Progress Note   Patient: Tanya Walsh FMW:[id number] DOB: 30-Jul-1956 DOA: 08/09/2024     3 DOS: the patient was seen and examined on 08/13/2024   Brief hospital course: HPI: Tanya Walsh is a 68 y.o. female with medical history significant for hypertension, type 2 diabetes mellitus, CAD, and neuroendocrine tumor with metastases who presents with chest pain.   Patient reports that she was in her usual state of health and had been having a good day when she developed chest pain yesterday evening.  She had just returned home and changed her close when she developed chest pressure with diaphoresis and generalized weakness shortly before 4 PM.  Symptoms began to improve after laying down, she went to sleep, but then woke with the same symptoms.  She reports that the symptoms are the same as what she experienced in April when she was admitted with NSTEMI, though not as severe as they were then.   She was treated with nitroglycerin  x 1 and 324 mg aspirin  by EMS prior to arrival in the ED.  She reports that the symptoms improved with nitroglycerin .   ED Course: Upon arrival to the ED, patient is found to be afebrile and saturating mid 90s on room air with heart rate in the 50s and stable blood pressure.  Labs are most notable for potassium 3.4, glucose 226, normal CBC, and troponin of 8 which increased to 24 and then 97.  EKG demonstrates sinus rhythm and chest x-ray is negative for acute findings.   Cardiology was consulted by the ED physician, aspirin  and fentanyl  were administered, and the patient was started on IV heparin  infusion.  Cardiology planned for left heart catheterization for 08/13/2024  Assessment and Plan:  1. NSTEMI/ known CAD with angina - Appreciate cardiology consultation  - Treated with ASA 324 mg prior to arrival  - Continue IV heparin , ASA 81 mg, Plavix  75 mg,Imdur   and Crestor , continue cardiac monitoring, trend troponin, cardiology planned left heart catheterization  possibly Monday morning 12/1. - Echocardiogram was done 11/28, showed normal ejection fraction and no wall motion abnormalities -Scheduled for cardiac cath this morning  2. Hypokalemia -Replaced potassium and repeat K in AM   3. Type II DM  - A1c was 7.5% in April 2025  - Check CBGs and use low-intensity SSI for now     4. Hypertension - Blood pressure is stable at 126/89 - Continue Hyzaar 50 mg p.o. daily, HCTZ 12.5 mg p.o. daily and Imdur  15 mg p.o. daily - Continue to monitor blood pressure   5. OSA  - CPAP while sleeping     6. 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 Lanny -No leukocytosis, UA is negative, no concern for acute infection at this point -Follow-up with primary oncologist as outpatient when she gets better from the hospital.       Subjective: No fever no chills no nausea no vomiting  Physical Exam: Vitals:   08/13/24 1230 08/13/24 1233 08/13/24 1248 08/13/24 1303  BP: (!) 141/90     Pulse: 67 65 65 65  Resp: 20 17 18 16   Temp:      TempSrc:      SpO2: 93% 96% 92% 94%  Weight:      Height:       Constitutional: Alert, awake, calm, comfortable HEENT: Neck supple Respiratory: clear to auscultation bilaterally, no wheezing, no crackles. Normal respiratory  effort. No accessory muscle use.  Cardiovascular: Regular rate and rhythm, no murmurs / rubs / gallops. No extremity edema. 2+ pedal pulses. No carotid bruits.  Abdomen: no tenderness, no masses palpated. No hepatosplenomegaly. Bowel sounds positive.  Musculoskeletal: no clubbing / cyanosis. No joint deformity upper and lower extremities. Good ROM, no contractures. Normal muscle tone.  Skin: no rashes, lesions, ulcers. No induration Neurologic: CN 2-12 grossly intact. Sensation intact, DTR normal. Strength 5/5 x all 4 extremities.  Psychiatric: Normal judgment and  insight. Alert and oriented x 3. Normal mood.   Data Reviewed:  Potassium 3.4  Family Communication: None  Disposition: Status is: Inpatient Remains inpatient appropriate because: Ongoing recovery from acute non-ST elevation myocardial infarction requiring cardiac catheterization  Planned Discharge Destination: Home    Time spent: 35 minutes  Author: Landon FORBES Baller, MD 08/13/2024 1:44 PM  For on call review [email or web address].

## 2024-08-13 NOTE — Progress Notes (Signed)
 [facility] Interventional Cardiology Note  Patient evaluated following PCI earlier today and removal of right femoral artery sheath approximately 30 minutes ago.  Patient complains of some vague lower abdominal and low back discomfort, some of which she experienced before today's intervention.  She is pole was unremarkable.  Some firmness is noted in both groins, which appear symmetric.  No obvious hematoma noted in the right groin, though skin overlying the arteriotomy site is tender to palpation.  Right femoral artery pulse is 2+.  Left radial arteriotomy site has TR band in place, albeit deflated at this point.  No left radial hematoma noted.  Will continue routine post PCI monitoring; anticipate discharge home tomorrow if no complications occur.  I advised [name] to alert us  immediately if she develops worsening groin, lower abdominal, or back pain.  Tanya Hanson, MD [facility] 08/13/24 2:50 PM

## 2024-08-13 NOTE — Progress Notes (Signed)
 TR BAND REMOVAL  LOCATION:  L  radial  DEFLATED PER PROTOCOL:   yes  TIME BAND OFF / DRESSING APPLIED:    1440  SITE UPON ARRIVAL:    Level 0  SITE AFTER BAND REMOVAL:    Level 0  CIRCULATION SENSATION AND MOVEMENT:    Within Normal Limits   COMMENTS:

## 2024-08-13 NOTE — H&P (View-Only) (Signed)
 Progress Note  Patient Name: Tanya Walsh Date of Encounter: 08/13/2024 [facility] Cardiologist: None   Interval Summary    No acute events overnight.  Denies chest or abdominal pain.  Has some mild RLE discomfort, has not been out of bed in > 24 hours  No bleeding while on heparin  gtt.  Vital Signs Vitals:   08/12/24 1921 08/12/24 2009 08/13/24 0022 08/13/24 0433  BP:  121/68 102/69 129/74  Pulse:  67 65 60  Resp:  20 16 16   Temp:  97.8 F (36.6 C) 98.2 F (36.8 C) 98.2 F (36.8 C)  TempSrc:  Oral Oral Oral  SpO2:  95% 95% 95%  Weight: 77 kg     Height:        Intake/Output Summary (Last 24 hours) at 08/13/2024 0743 Last data filed at 08/13/2024 0435 Gross per 24 hour  Intake 1343.76 ml  Output 400 ml  Net 943.76 ml      08/12/2024    7:21 PM 08/10/2024    5:22 AM 06/04/2024    4:57 PM  Last 3 Weights  Weight (lbs) 169 lb 12.1 oz 169 lb 8.5 oz 177 lb 11.1 oz  Weight (kg) 77 kg 76.9 kg 80.6 kg      Telemetry/ECG   Normal sinus rhythm- Personally Reviewed  Physical Exam  GEN: Well nourished, well developed in no acute distress HEENT: Normal NECK: No JVD; No carotid bruits LYMPHATICS: No lymphadenopathy CARDIAC:RRR, no murmurs, rubs, gallops RESPIRATORY:  Clear to auscultation without rales, wheezing or rhonchi  ABDOMEN: Soft, non-tender, non-distended MUSCULOSKELETAL:  No edema; No deformity; RLE without edema, swelling. SKIN: Warm and dry NEUROLOGIC:  Alert and oriented x 3 PSYCHIATRIC:  Normal affect    Assessment & Plan   68 y.o. female Jehovah witness with a hx of CAD NSTEMI 12/2023 (was treated medically) with prior MI in [place]  (details unknown), HTN, HLD, OSA, metastatic neuroendocrine tumor of small intestine, history of GIB who is being seen 08/10/2024 for the evaluation of chest pain => with troponins over 400 consistent with mild non-STEMI  NSTEMI Known CAD-with angina -- [facility] 12/2023 50% ostial circumflex lesion with  30% distal left main to ostial LAD lesion and 70% proximal RCA lesion treated medically with Plavix  and Imdur .  -- presented this admission with chest pain last evening w/ associated diaphoresis and generalized weakness that improved with SL nitroglycerin   -- hsTn 8>>24>>97>>455, EKG on admission with slight j point elevation in lead II. Repeat EKG this morning no ST/T wave changes (consistent with non-STEMI) -- No further chest pain or abdominal pain since admission -- 2D echo 08/10/2024: EF 60 to 65% with no focal wall motion abnormalities, normal diastolic function, mild MR -- Initially patient gave some history of bladder pain along with urinary frequency and dysuria.  -- CT of the abdomen and pelvis on 08/10/2024 showed new nodules in the central small bowel mesentery measuring up to 10 mm suspicious for peritoneal or lymph node metastasis and a stable small metastatic lesion in the posterior right hepatic lobe with 1 cm left adrenal nodule. -- UA negative, CBC normal -- Continue aspirin  81 mg daily, Plavix  75 mg daily, Imdur  15 mg daily, Crestor  20 mg daily --TTE 11/25 EF 60%, no significant valve issues --EKG shows new inferior TWI suggesting RCA involvement -- I reviewed the patient's coronary angiography from 4/25 demonstrating a moderate proximal RCA lesion and mild LM/LAD disease.  Discussed plan for cath with likely PCI today.  Given history  of GIB, would consider PCI with Resolute stent platform (approved for 1 month DAPT).    I reviewed the risks and benefits of PCI with patient and she agrees with the plan.  Right radial 1+ pulse and seems feasible for cath.     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 Lanny  -- of note, she was started on Lutathera  and shortly afterwards experienced NSTEMI with severe hyperglycemia and treatment  was stopped.  -- CT abd/pelvis 12/2023 noted re-demonstration of ill-defined hypoattenuating lesion in the right hepatic lobe along with stable 8 x 10 mm left adrenal nodule -- PET scan on April 05, 2024 showed improved peritoneal metastasis, and similar hypermetabolic liver met. No new lesions --CT of the abdomen pelvis 08/02/2024 showed new nodules in the central small bowel mesentery measuring up to 10 mm suspicious for peritoneal or lymph node metastasis and a stable metastatic lesion to posterior hepatic lobe and 1 cm left adrenal nodule -- reports lower abd pain, also urinary frequency some dysuria as well  -- UA is negative and CBC normal -- management per TRH and oncology -- No bleeding while on heparin  gtt.  HTN -- BP soft this a.m. at 97/68 mmHg -- Holding losartan  and HCTZ for cath, resume after cath.   I have reviewed the risks, indications, and alternatives to cardiac catheterization, possible angioplasty, and stenting with the patient. Risks include but are not limited to bleeding, infection, vascular injury, stroke, myocardial infection, arrhythmia, kidney injury, radiation-related injury in the case of prolonged fluoroscopy use, emergency cardiac surgery, and death. The patient understands the risks of serious complication is 1-2 in 1000 with diagnostic cardiac cath and 1-2% or less with angioplasty/stenting.    I spent 25 minutes caring for this patient today face to face, ordering and reviewing labs, reviewing prior cath images, reviewing records from 2D echo 08/10/2024 and CT of ABd/Pelvis, seeing the patient, documenting in the record, and reviewing risks and benefits of cath with patient.  For questions or updates, please contact [facility] Please consult [email or web address] for contact info under   Signed, Rahm Minix K Akil Hoos, MD [facility] 08/11/2024 10:15AM

## 2024-08-13 NOTE — Progress Notes (Signed)
 Site Area: right groin  Site prior to removal: level 0, clean/dry/intact   Pressure applied for: 30 minutes   Bedrest beginning at:1410  Manual: yes, held by [employer], RT  Patient status during pull: stable   Post pull groin site: level 0, clean/dry/intact  Post pull instructions given: yes  Post pull pulses present: +2 DP on right foot   Dressing applied: gauze and tegaderm   Comments:

## 2024-08-13 NOTE — Interval H&P Note (Signed)
 History and Physical Interval Note:  08/13/2024 8:42 AM  Tanya Walsh  has presented today for surgery, with the diagnosis of NSTEMI.  The various methods of treatment have been discussed with the patient and family. After consideration of risks, benefits and other options for treatment, the patient has consented to  Procedure(s): LEFT HEART CATH AND CORONARY ANGIOGRAPHY (N/A) as a surgical intervention.  The patient's history has been reviewed, patient examined, no change in status, stable for surgery.  I have reviewed the patient's chart and labs.  Questions were answered to the patient's satisfaction.    Cath Lab Visit (complete for each Cath Lab visit)  Clinical Evaluation Leading to the Procedure:   ACS: Yes.    Non-ACS:  N/A  [name]

## 2024-08-13 NOTE — Progress Notes (Signed)
 Progress Note  Patient Name: Tanya Walsh Date of Encounter: 08/13/2024 [facility] Cardiologist: None   Interval Summary    No acute events overnight.  Denies chest or abdominal pain.  Has some mild RLE discomfort, has not been out of bed in > 24 hours  No bleeding while on heparin  gtt.  Vital Signs Vitals:   08/12/24 1921 08/12/24 2009 08/13/24 0022 08/13/24 0433  BP:  121/68 102/69 129/74  Pulse:  67 65 60  Resp:  20 16 16   Temp:  97.8 F (36.6 C) 98.2 F (36.8 C) 98.2 F (36.8 C)  TempSrc:  Oral Oral Oral  SpO2:  95% 95% 95%  Weight: 77 kg     Height:        Intake/Output Summary (Last 24 hours) at 08/13/2024 0743 Last data filed at 08/13/2024 0435 Gross per 24 hour  Intake 1343.76 ml  Output 400 ml  Net 943.76 ml      08/12/2024    7:21 PM 08/10/2024    5:22 AM 06/04/2024    4:57 PM  Last 3 Weights  Weight (lbs) 169 lb 12.1 oz 169 lb 8.5 oz 177 lb 11.1 oz  Weight (kg) 77 kg 76.9 kg 80.6 kg      Telemetry/ECG   Normal sinus rhythm- Personally Reviewed  Physical Exam  GEN: Well nourished, well developed in no acute distress HEENT: Normal NECK: No JVD; No carotid bruits LYMPHATICS: No lymphadenopathy CARDIAC:RRR, no murmurs, rubs, gallops RESPIRATORY:  Clear to auscultation without rales, wheezing or rhonchi  ABDOMEN: Soft, non-tender, non-distended MUSCULOSKELETAL:  No edema; No deformity; RLE without edema, swelling. SKIN: Warm and dry NEUROLOGIC:  Alert and oriented x 3 PSYCHIATRIC:  Normal affect    Assessment & Plan   68 y.o. female Jehovah witness with a hx of CAD NSTEMI 12/2023 (was treated medically) with prior MI in [place]  (details unknown), HTN, HLD, OSA, metastatic neuroendocrine tumor of small intestine, history of GIB who is being seen 08/10/2024 for the evaluation of chest pain => with troponins over 400 consistent with mild non-STEMI  NSTEMI Known CAD-with angina -- [facility] 12/2023 50% ostial circumflex lesion with  30% distal left main to ostial LAD lesion and 70% proximal RCA lesion treated medically with Plavix  and Imdur .  -- presented this admission with chest pain last evening w/ associated diaphoresis and generalized weakness that improved with SL nitroglycerin   -- hsTn 8>>24>>97>>455, EKG on admission with slight j point elevation in lead II. Repeat EKG this morning no ST/T wave changes (consistent with non-STEMI) -- No further chest pain or abdominal pain since admission -- 2D echo 08/10/2024: EF 60 to 65% with no focal wall motion abnormalities, normal diastolic function, mild MR -- Initially patient gave some history of bladder pain along with urinary frequency and dysuria.  -- CT of the abdomen and pelvis on 08/10/2024 showed new nodules in the central small bowel mesentery measuring up to 10 mm suspicious for peritoneal or lymph node metastasis and a stable small metastatic lesion in the posterior right hepatic lobe with 1 cm left adrenal nodule. -- UA negative, CBC normal -- Continue aspirin  81 mg daily, Plavix  75 mg daily, Imdur  15 mg daily, Crestor  20 mg daily --TTE 11/25 EF 60%, no significant valve issues --EKG shows new inferior TWI suggesting RCA involvement -- I reviewed the patient's coronary angiography from 4/25 demonstrating a moderate proximal RCA lesion and mild LM/LAD disease.  Discussed plan for cath with likely PCI today.  Given history  of GIB, would consider PCI with Resolute stent platform (approved for 1 month DAPT).    I reviewed the risks and benefits of PCI with patient and she agrees with the plan.  Right radial 1+ pulse and seems feasible for cath.     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 Lanny  -- of note, she was started on Lutathera  and shortly afterwards experienced NSTEMI with severe hyperglycemia and treatment  was stopped.  -- CT abd/pelvis 12/2023 noted re-demonstration of ill-defined hypoattenuating lesion in the right hepatic lobe along with stable 8 x 10 mm left adrenal nodule -- PET scan on April 05, 2024 showed improved peritoneal metastasis, and similar hypermetabolic liver met. No new lesions --CT of the abdomen pelvis 08/02/2024 showed new nodules in the central small bowel mesentery measuring up to 10 mm suspicious for peritoneal or lymph node metastasis and a stable metastatic lesion to posterior hepatic lobe and 1 cm left adrenal nodule -- reports lower abd pain, also urinary frequency some dysuria as well  -- UA is negative and CBC normal -- management per TRH and oncology -- No bleeding while on heparin  gtt.  HTN -- BP soft this a.m. at 97/68 mmHg -- Holding losartan  and HCTZ for cath, resume after cath.   I have reviewed the risks, indications, and alternatives to cardiac catheterization, possible angioplasty, and stenting with the patient. Risks include but are not limited to bleeding, infection, vascular injury, stroke, myocardial infection, arrhythmia, kidney injury, radiation-related injury in the case of prolonged fluoroscopy use, emergency cardiac surgery, and death. The patient understands the risks of serious complication is 1-2 in 1000 with diagnostic cardiac cath and 1-2% or less with angioplasty/stenting.    I spent 25 minutes caring for this patient today face to face, ordering and reviewing labs, reviewing prior cath images, reviewing records from 2D echo 08/10/2024 and CT of ABd/Pelvis, seeing the patient, documenting in the record, and reviewing risks and benefits of cath with patient.  For questions or updates, please contact [facility] Please consult [email or web address] for contact info under   Signed, Rahm Minix K Akil Hoos, MD [facility] 08/11/2024 10:15AM

## 2024-08-13 NOTE — Discharge Instructions (Signed)

## 2024-08-14 ENCOUNTER — Other Ambulatory Visit (HOSPITAL_COMMUNITY): Payer: Self-pay

## 2024-08-14 ENCOUNTER — Encounter (HOSPITAL_COMMUNITY): Payer: Self-pay | Admitting: Internal Medicine

## 2024-08-14 DIAGNOSIS — I214 Non-ST elevation (NSTEMI) myocardial infarction: Secondary | ICD-10-CM | POA: Diagnosis not present

## 2024-08-14 LAB — BASIC METABOLIC PANEL WITH GFR
Anion gap: 8 (ref 5–15)
BUN: 9 mg/dL (ref 8–23)
CO2: 27 mmol/L (ref 22–32)
Calcium: 8.7 mg/dL — ABNORMAL LOW (ref 8.9–10.3)
Chloride: 103 mmol/L (ref 98–111)
Creatinine, Ser: 0.98 mg/dL (ref 0.44–1.00)
GFR, Estimated: >60 mL/min (ref >60)
Glucose, Bld: 136 mg/dL — ABNORMAL HIGH (ref 70–99)
Potassium: 4 mmol/L (ref 3.5–5.1)
Sodium: 138 mmol/L (ref 135–145)

## 2024-08-14 LAB — CBC
HCT: 36.7 % (ref 36.0–46.0)
Hemoglobin: 12.2 g/dL (ref 12.0–15.0)
MCH: 29.6 pg (ref 26.0–34.0)
MCHC: 33.2 g/dL (ref 30.0–36.0)
MCV: 89.1 fL (ref 80.0–100.0)
Platelets: 263 K/uL (ref 150–400)
RBC: 4.12 MIL/uL (ref 3.87–5.11)
RDW: 12.6 % (ref 11.5–15.5)
WBC: 5.8 K/uL (ref 4.0–10.5)
nRBC: 0 % (ref 0.0–0.2)

## 2024-08-14 LAB — POCT ACTIVATED CLOTTING TIME
Activated Clotting Time: 261 s
Activated Clotting Time: 301 s
Activated Clotting Time: 312 s
Activated Clotting Time: 276 s

## 2024-08-14 LAB — GLUCOSE, CAPILLARY
Glucose-Capillary: 146 mg/dL — ABNORMAL HIGH (ref 70–99)
Glucose-Capillary: 132 mg/dL — ABNORMAL HIGH (ref 70–99)
Glucose-Capillary: 151 mg/dL — ABNORMAL HIGH (ref 70–99)

## 2024-08-14 LAB — MAGNESIUM: Magnesium: 1.9 mg/dL (ref 1.7–2.4)

## 2024-08-14 LAB — HEMOGLOBIN A1C
Hgb A1c MFr Bld: 7.1 % — ABNORMAL HIGH (ref 4.8–5.6)
Mean Plasma Glucose: 157 mg/dL

## 2024-08-14 MED ORDER — ROSUVASTATIN CALCIUM 20 MG PO TABS: 40.0000 mg | ORAL_TABLET | Freq: Every day | ORAL | Status: DC

## 2024-08-14 MED ORDER — PANTOPRAZOLE SODIUM 40 MG PO TBEC
40.0000 mg | DELAYED_RELEASE_TABLET | Freq: Every day | ORAL | 0 refills | Status: AC
  Filled 2024-08-14: qty 90, 90d supply, fill #0

## 2024-08-14 MED ORDER — LOSARTAN POTASSIUM 25 MG PO TABS
25.0000 mg | ORAL_TABLET | Freq: Every day | ORAL | Status: DC
  Administered 2024-08-14: 25 mg via ORAL
  Filled 2024-08-14: qty 1

## 2024-08-14 MED ORDER — LOSARTAN POTASSIUM 25 MG PO TABS
25.0000 mg | ORAL_TABLET | Freq: Every day | ORAL | 1 refills | Status: AC
  Filled 2024-08-14: qty 90, 90d supply, fill #0

## 2024-08-14 MED ORDER — EZETIMIBE 10 MG PO TABS
10.0000 mg | ORAL_TABLET | Freq: Every day | ORAL | 1 refills | Status: AC
  Filled 2024-08-14: qty 90, 90d supply, fill #0

## 2024-08-14 MED ORDER — ROSUVASTATIN CALCIUM 40 MG PO TABS
40.0000 mg | ORAL_TABLET | Freq: Every day | ORAL | 1 refills | Status: AC
  Filled 2024-08-14: qty 90, 90d supply, fill #0

## 2024-08-14 MED ORDER — EZETIMIBE 10 MG PO TABS
10.0000 mg | ORAL_TABLET | Freq: Every day | ORAL | Status: DC
  Administered 2024-08-14: 10 mg via ORAL
  Filled 2024-08-14: qty 1

## 2024-08-14 MED ORDER — MAGNESIUM SULFATE 2 GM/50ML IV SOLN
2.0000 g | Freq: Once | INTRAVENOUS | Status: AC
  Administered 2024-08-14: 2 g via INTRAVENOUS
  Filled 2024-08-14: qty 50

## 2024-08-14 MED ORDER — ISOSORBIDE MONONITRATE ER 30 MG PO TB24
30.0000 mg | ORAL_TABLET | Freq: Every day | ORAL | 1 refills | Status: DC
  Filled 2024-08-14: qty 90, 90d supply, fill #0

## 2024-08-14 MED ORDER — NITROGLYCERIN 0.4 MG SL SUBL
0.4000 mg | SUBLINGUAL_TABLET | SUBLINGUAL | 3 refills | Status: AC | PRN
  Filled 2024-08-14: qty 25, 8d supply, fill #0

## 2024-08-14 NOTE — Care Management Important Message (Signed)
 Important Message  Patient Details  Name: Tanya Walsh MRN: [id number] Date of Birth: Nov 04, 1955   Important Message Given:  Yes - Medicare IM     Jennie Laneta Dragon 08/14/2024, 10:40 AM

## 2024-08-14 NOTE — Discharge Summary (Signed)
 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 *  Hospital Course: HPI: Tanya Walsh is a 68 y.o. female with medical history significant for hypertension, type 2 diabetes mellitus, CAD, and neuroendocrine tumor with metastases who presents with chest pain.   Patient reports that she was in her usual state of health and had been having a good day when she developed chest pain yesterday evening.  She had just returned home and changed her close when she developed chest pressure with diaphoresis and generalized weakness shortly before 4 PM.  Symptoms began to improve after laying down, she went to sleep, but then woke with the same symptoms.  She reports that the symptoms are the same as what she experienced in April when she was admitted with NSTEMI, though not as severe as they were then.   She was treated with nitroglycerin  x 1 and 324 mg aspirin  by EMS prior to arrival in the ED.  She reports that the symptoms improved with nitroglycerin .   ED Course: Upon arrival to the ED, patient is found to be afebrile and saturating mid 90s on room air with heart rate in the 50s and stable blood pressure.  Labs are most notable for potassium 3.4, glucose 226, normal CBC, and troponin of 8 which increased to 24 and then 97.  EKG demonstrates sinus rhythm and  chest x-ray is negative for acute findings.   Cardiology was consulted by the ED physician, aspirin  and fentanyl  were administered, and the patient was started on IV heparin  infusion.  Cardiology planned for left heart catheterization for 08/13/2024  Assessment and Plan: 1. NSTEMI/ known CAD with angina - Appreciate cardiology consultation  - Treated with ASA 324 mg prior to arrival  - Continue IV heparin , ASA 81 mg, Plavix  75 mg,Imdur   and Crestor , continue cardiac monitoring, trend troponin, cardiology planned left heart catheterization possibly Monday morning 12/1. - Echocardiogram was done 11/28, showed normal ejection fraction and no wall motion abnormalities -Status post cardiac catheterization 12/1 with severe single-vessel CAD of 90% proximal RCA as well as ostial/proximal RCA vasospasm.  Successful PCI/DES x 1 to proximal RCA.  Recommendations for DAPT with aspirin  and Plavix  for 1 month ideally longer if able to tolerate from bleeding standpoint. -- Continue aspirin  81 mg daily, Plavix  75 mg daily, Imdur  15 mg daily, Crestor  20 mg daily   2. Hypokalemia -Replaced potassium and repeat K in AM   3. Type II DM  - A1c was 7.5% in April 2025  - Check CBGs and use low-intensity SSI for now     4. Hypertension - Blood pressure is stable at 126/89 - Continue Hyzaar 50 mg p.o. daily, HCTZ 12.5 mg p.o. daily and Imdur  15 mg p.o. daily - Continue to monitor blood pressure   5. OSA  - CPAP while sleeping     6.  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 Lanny -No leukocytosis, UA is negative, no concern for acute infection at this point -Follow-up with primary oncologist as outpatient when she gets better from the hospital.          Consultants:  Procedures performed:   Disposition: Home Diet recommendation:  Cardiac diet DISCHARGE  MEDICATION: Allergies as of 08/14/2024       Reactions   Bactrim [sulfamethoxazole-trimethoprim] Swelling        Medication List     STOP taking these medications    losartan -hydrochlorothiazide  50-12.5 MG tablet Commonly known as: HYZAAR       TAKE these medications    acetaminophen  650 MG CR tablet Commonly known as: TYLENOL  Take 650 mg by mouth every 8 (eight) hours as needed for pain.   aspirin  EC 81 MG tablet Take 1 tablet (81 mg total) by mouth daily. Swallow whole.   clopidogrel  75 MG tablet Commonly known as: PLAVIX  Take 1 tablet (75 mg total) by mouth daily.   cyclobenzaprine  5 MG tablet Commonly known as: FLEXERIL  Take 1 tablet (5 mg total) by mouth 3 (three) times daily as needed for muscle spasms.   DULoxetine  60 MG capsule Commonly known as: CYMBALTA  Take 60 mg by mouth in the morning.   ezetimibe  10 MG tablet Commonly known as: ZETIA  Take 1 tablet (10 mg total) by mouth daily.   fluticasone 50 MCG/ACT nasal spray Commonly known as: FLONASE Place 1 spray into both nostrils as needed for allergies or rhinitis.   folic acid  800 MCG tablet Commonly known as: FOLVITE  Take 800 mcg by mouth in the morning.   gabapentin  100 MG capsule Commonly known as: NEURONTIN  TAKE 1 CAPSULE BY MOUTH AT BEDTIME.   isosorbide  mononitrate 30 MG 24 hr tablet Commonly known as: IMDUR  Take 1 tablet (30 mg total) by mouth daily. What changed:  how much to take additional instructions   losartan  25 MG tablet Commonly known as: COZAAR  Take 1 tablet (25 mg total) by mouth daily.   metFORMIN  500 MG 24 hr tablet Commonly known as: GLUCOPHAGE -XR Take 500 mg by mouth 2 (two) times daily with a meal.   Multivitamin Adults 50+ Tabs Take 1 tablet by mouth daily.   nitroGLYCERIN  0.4 MG SL tablet Commonly known as: NITROSTAT  Place 1 tablet (0.4 mg total) under the tongue every 5 (five) minutes x 3 doses as needed for chest pain. What changed: when to take this    ondansetron  8 MG tablet Commonly known as: ZOFRAN  Take 1 tablet (8 mg total) by mouth 2 (two) times daily as needed for nausea or vomiting.   pantoprazole  40 MG tablet Commonly known as: PROTONIX  Take 1 tablet (40 mg total) by mouth daily.   rosuvastatin  40 MG tablet Commonly known as: CRESTOR  Take 1 tablet (40 mg total) by mouth daily. What changed:  medication strength how much to take when to take this   traMADol  50 MG tablet Commonly known as: ULTRAM  Take 1 tablet (50 mg total) by mouth every 12 (twelve) hours as needed. What changed: reasons to take this   trolamine salicylate 10 % cream Commonly known as: ASPERCREME Apply 1 application topically as needed for muscle pain.        Discharge Exam: Filed Weights   08/10/24 0522 08/12/24 1921  Weight: 76.9 kg 77 kg    Condition at discharge: fair  The  results of significant diagnostics from this hospitalization (including imaging, microbiology, ancillary and laboratory) are listed below for reference.   Imaging Studies: CARDIAC CATHETERIZATION Result Date: 08/13/2024   Dist LM to Ost LAD lesion is 30% stenosed.   Ost Cx lesion is 50% stenosed.   Prox RCA lesion is 90% stenosed.   Ost RCA to Prox RCA lesion is 20% stenosed.   A drug-eluting stent was successfully placed using a STENT ONYX FRONTIER 3.5X30.   Post intervention, there is a 0% residual stenosis.   LV end diastolic pressure is normal.   There is no aortic valve stenosis.   In the absence of any other complications or medical issues, we expect the patient to be ready for discharge from an interventional cardiology perspective on 08/14/2024.   Recommend uninterrupted dual antiplatelet therapy with Aspirin  81mg  daily and Clopidogrel  75mg  daily for a minimum of 12 months (ACS-Class I recommendation).   If concern for bleeding persists, DAPT can be shortened but must be continued for at least 1 month (and ideally at least 3 months, after which time aspirin  could be  discontinued). Conclusions: Severe single-vessel coronary artery disease with 90% proximal RCA disease as well as ostial/proximal RCA vasospasm leading to up to 75% stenosis.  Left coronary artery shows mild-moderate, non-obstructive disease. Normal left ventricular filling pressure (LVEDP 7 mmHg). Left radial artery loop necessitating transition to femoral access. Successful PCI to proximal RCA using Onyx Frontier 3.5 x 30 mm drug-eluting stent with 0% residual stenosis and TIMI-3 flow.  Mild stenosis (~20%) proximal to stent is consistent with residual vasospasm, given response to intracoronary nitroglycerin  and absence of dissection on OCT. Recommendation: Continue dual antiplatelet therapy with aspirin  and clopidogrel  for at least 1 month, ideally longer if tolerated from a bleeding standpoint. Aggressive secondary prevention of coronary artery disease. Lonni Hanson, MD [facility]  CT ABDOMEN PELVIS W CONTRAST Result Date: 08/11/2024 CLINICAL DATA:  Abdominal pain. Metastatic small bowel neuroendocrine tumor. * Tracking Code: BO * EXAM: CT ABDOMEN AND PELVIS WITH CONTRAST TECHNIQUE: Multidetector CT imaging of the abdomen and pelvis was performed using the standard protocol following bolus administration of intravenous contrast. RADIATION DOSE REDUCTION: This exam was performed according to the departmental dose-optimization program which includes automated exposure control, adjustment of the mA and/or kV according to patient size and/or use of iterative reconstruction technique. CONTRAST:  75mL OMNIPAQUE  IOHEXOL  350 MG/ML SOLN COMPARISON:  12/29/2023 FINDINGS: Lower Chest: No acute findings. Hepatobiliary: Heterogeneous hypervascular lesion measuring 2.2 x 1.9 cm in the posterior right hepatic lobe shows no significant change. No new or enlarging liver lesions identified. Gallbladder is unremarkable. No evidence of biliary ductal dilatation. Pancreas:  No mass or inflammatory changes. Spleen: Within  normal limits in size and appearance. Adrenals/Urinary Tract: Stable 1 cm left adrenal nodule. No suspicious renal masses identified. No evidence of ureteral calculi or hydronephrosis. Stomach/Bowel: No evidence of obstruction, inflammatory process or abnormal fluid collections. Vascular/Lymphatic: Soft tissue nodule or lymph node with central focus of calcification in the central small bowel mesentery measures 10 x 9 mm and appears new since previous study. Adjacent 5 mm mesenteric lymph node also appears new. These findings may be due to peritoneal metastases or lymph node metastases. No acute vascular findings. Reproductive: Prior hysterectomy noted. Adnexal regions are unremarkable in appearance. Other:  None. Musculoskeletal:  No suspicious bone lesions identified. IMPRESSION: New nodules in the central small bowel mesentery measuring up to 10 mm, suspicious for peritoneal or lymph node metastases. Stable small metastatic  lesion in posterior right hepatic lobe. Stable 1 cm left adrenal nodule. Electronically Signed   By: Norleen DELENA Kil M.D.   On: 08/11/2024 07:22   ECHOCARDIOGRAM COMPLETE Result Date: 08/10/2024    ECHOCARDIOGRAM REPORT   Patient Name:   Tanya Walsh Date of Exam: 08/10/2024 Medical Rec #:  [id number]         Height:       63.0 in Accession #:    [id number]        Weight:       169.5 lb Date of Birth:  Jan 14, 1956         BSA:          1.802 m Patient Age:    68 years          BP:           105/75 mmHg Patient Gender: F                 HR:           56 bpm. Exam Location:  Inpatient Procedure: 2D Echo, Cardiac Doppler and Color Doppler (Both Spectral and Color            Flow Doppler were utilized during procedure). Indications:    Chest Pain R07.9  History:        Patient has prior history of Echocardiogram examinations, most                 recent 12/30/2023. CAD and Previous Myocardial Infarction; Risk                 Factors:Hypertension.  Sonographer:    Jayson Gaskins Referring Phys:  [id number] HUSAM M [facility] IMPRESSIONS  1. Left ventricular ejection fraction, by estimation, is 60 to 65%. Left ventricular ejection fraction by PLAX is 60 %. The left ventricle has normal function. The left ventricle has no regional wall motion abnormalities. There is mild concentric left ventricular hypertrophy. Left ventricular diastolic parameters were normal.  2. Right ventricular systolic function is normal. The right ventricular size is normal. Tricuspid regurgitation signal is inadequate for assessing PA pressure.  3. The mitral valve is degenerative. Mild mitral valve regurgitation. No evidence of mitral stenosis.  4. The aortic valve is tricuspid. There is no evidence of aortic regurgitation or stenosis. There is nodular calcification on the non-coronary cusp of the aortic valve.  5. The inferior vena cava is normal in size with greater than 50% respiratory variability, suggesting right atrial pressure of 3 mmHg. Comparison(s): No significant change from prior study. Conclusion(s)/Recommendation(s): Otherwise normal echocardiogram, with minor abnormalities described in the report. FINDINGS  Left Ventricle: Left ventricular ejection fraction, by estimation, is 60 to 65%. Left ventricular ejection fraction by PLAX is 60 %. The left ventricle has normal function. The left ventricle has no regional wall motion abnormalities. The left ventricular internal cavity size was normal in size. There is mild concentric left ventricular hypertrophy. Left ventricular diastolic parameters were normal. Right Ventricle: The right ventricular size is normal. No increase in right ventricular wall thickness. Right ventricular systolic function is normal. Tricuspid regurgitation signal is inadequate for assessing PA pressure. Left Atrium: Left atrial size was normal in size. Right Atrium: Right atrial size was normal in size. Pericardium: There is no evidence of pericardial effusion. Mitral Valve: The mitral valve is degenerative  in appearance. Mild mitral annular calcification. Mild mitral valve regurgitation. No evidence of mitral valve stenosis. Tricuspid Valve: The tricuspid valve is normal in  structure. Tricuspid valve regurgitation is trivial. No evidence of tricuspid stenosis. Aortic Valve: The aortic valve is tricuspid. There is no evidence of aortic regurgitation or stenosis. There is nodular calcification on the non-coronary cusp of the aortic valve. Aortic valve mean gradient measures 2.0 mmHg. Aortic valve peak gradient measures 3.5 mmHg. Aortic valve area, by VTI measures 2.45 cm. Pulmonic Valve: The pulmonic valve was normal in structure. Pulmonic valve regurgitation is mild. No evidence of pulmonic stenosis. Aorta: The aortic root is normal in size and structure. Venous: The inferior vena cava is normal in size with greater than 50% respiratory variability, suggesting right atrial pressure of 3 mmHg. IAS/Shunts: No atrial level shunt detected by color flow Doppler.  LEFT VENTRICLE PLAX 2D LV EF:         Left            Diastology                ventricular     LV e' medial:    5.98 cm/s                ejection        LV E/e' medial:  10.3                fraction by     LV e' lateral:   10.30 cm/s                PLAX is 60      LV E/e' lateral: 6.0                %. LVIDd:         4.10 cm LVIDs:         2.80 cm LV PW:         1.00 cm LV IVS:        1.10 cm LVOT diam:     1.80 cm LV SV:         53 LV SV Index:   30 LVOT Area:     2.54 cm LV IVRT:       67 msec  RIGHT VENTRICLE RV S prime:     14.40 cm/s  PULMONARY VEINS TAPSE (M-mode): 2.2 cm      Diastolic Velocity: 47.70 cm/s                             S/D Velocity:       1.10                             Systolic Velocity:  53.50 cm/s LEFT ATRIUM             Index        RIGHT ATRIUM           Index LA Vol (A2C):   29.0 ml 16.09 ml/m  RA Area:     10.20 cm LA Vol (A4C):   30.7 ml 17.03 ml/m  RA Volume:   20.70 ml  11.48 ml/m LA Biplane Vol: 31.0 ml 17.20 ml/m  AORTIC  VALVE AV Area (Vmax):    2.44 cm AV Area (Vmean):   2.30 cm AV Area (VTI):     2.45 cm AV Vmax:           93.20 cm/s AV Vmean:          66.500 cm/s AV VTI:  0.217 m AV Peak Grad:      3.5 mmHg AV Mean Grad:      2.0 mmHg LVOT Vmax:         89.40 cm/s LVOT Vmean:        60.200 cm/s LVOT VTI:          0.209 m LVOT/AV VTI ratio: 0.96  AORTA Ao Root diam: 2.90 cm MITRAL VALVE MV Area (PHT): 1.89 cm    SHUNTS MV Decel Time: 401 msec    Systemic VTI:  0.21 m MV E velocity: 61.30 cm/s  Systemic Diam: 1.80 cm MV A velocity: 57.10 cm/s MV E/A ratio:  1.07 Georganna Archer Electronically signed by Georganna Archer Signature Date/Time: 08/10/2024/10:04:30 AM    Final    DG Chest Port 1 View Result Date: 08/09/2024 EXAM: 1 VIEW(S) XRAY OF THE CHEST 08/09/2024 09:50:00 PM COMPARISON: Comparison with 12/29/2023. CLINICAL HISTORY: cp FINDINGS: LUNGS AND PLEURA: No focal pulmonary opacity. No pleural effusion. No pneumothorax. HEART AND MEDIASTINUM: No acute abnormality of the cardiac and mediastinal silhouettes. BONES AND SOFT TISSUES: No acute osseous abnormality. IMPRESSION: 1. No acute cardiopulmonary process. Electronically signed by: Elsie Gravely MD 08/09/2024 09:56 PM EST RP Workstation: [id number]    Microbiology: Results for orders placed or performed during the hospital encounter of 04/09/23  SARS CORONAVIRUS 2 (TAT 6-24 HRS) Anterior Nasal Swab     Status: Abnormal   Collection Time: 04/09/23  5:12 PM   Specimen: Anterior Nasal Swab  Result Value Ref Range Status   SARS Coronavirus 2 POSITIVE (A) NEGATIVE Final    Comment: (NOTE) SARS-CoV-2 target nucleic acids are DETECTED.  The SARS-CoV-2 RNA is generally detectable in upper and lower respiratory specimens during the acute phase of infection. Positive results are indicative of the presence of SARS-CoV-2 RNA. Clinical correlation with patient history and other diagnostic information is  necessary to determine patient infection status.  Positive results do not rule out bacterial infection or co-infection with other viruses.  The expected result is Negative.  Fact Sheet for Patients: hairslick.no  Fact Sheet for Healthcare Providers: quierodirigir.com  This test is not yet approved or cleared by the [place]  FDA and  has been authorized for detection and/or diagnosis of SARS-CoV-2 by FDA under an Emergency Use Authorization (EUA). This EUA will remain  in effect (meaning this test can be used) for the duration of the COVID-19 declaration under Section 564(b)(1) of the Act, 21 U. S.C. section 360bbb-3(b)(1), unless the authorization is terminated or revoked sooner.   Performed at [facility], [address], [place], [place] [address]     Labs: CBC: Recent Labs  Lab 08/11/24 0323 08/12/24 0241 08/13/24 0334 08/14/24 0425  WBC 5.2 5.2 4.9 5.8  HGB 12.5 12.9 13.3 12.2  HCT 37.7 38.3 40.3 36.7  MCV 88.1 87.2 88.2 89.1  PLT 275 274 276 263   Basic Metabolic Panel: Recent Labs  Lab 08/11/24 0323 08/12/24 0241 08/13/24 0653 08/14/24 0425  NA 137 135 138 138  K 3.6 3.4* 3.6 4.0  CL 104 100 98 103  CO2 24 24 28 27   GLUCOSE 120* 131* 130* 136*  BUN 7* 12 12 9   CREATININE 0.98 1.07* 1.01* 0.98  CALCIUM  8.9 8.9 8.9 8.7*  MG 2.0 1.8  --  1.9   Liver Function Tests: No results for input(s): AST, ALT, ALKPHOS, BILITOT, PROT, ALBUMIN in the last 168 hours. CBG: Recent Labs  Lab 08/13/24 2017 08/13/24 2341 08/14/24 0501 08/14/24 9189  08/14/24 1127  GLUCAP 136* 151* 146* 132* 151*    Discharge time spent: greater than 30 minutes.  Signed: Landon FORBES Baller, MD [facility] 08/17/2024

## 2024-08-14 NOTE — Plan of Care (Signed)
 Problem: Education: Goal: Ability to describe self-care measures that may prevent or decrease complications (Diabetes Survival Skills Education) will improve Outcome: Adequate for Discharge Goal: Individualized Educational Video(s) Outcome: Adequate for Discharge   Problem: Coping: Goal: Ability to adjust to condition or change in health will improve Outcome: Adequate for Discharge   Problem: Fluid Volume: Goal: Ability to maintain a balanced intake and output will improve Outcome: Adequate for Discharge   Problem: Health Behavior/Discharge Planning: Goal: Ability to identify and utilize available resources and services will improve Outcome: Adequate for Discharge Goal: Ability to manage health-related needs will improve Outcome: Adequate for Discharge   Problem: Metabolic: Goal: Ability to maintain appropriate glucose levels will improve Outcome: Adequate for Discharge   Problem: Nutritional: Goal: Maintenance of adequate nutrition will improve Outcome: Adequate for Discharge Goal: Progress toward achieving an optimal weight will improve Outcome: Adequate for Discharge   Problem: Skin Integrity: Goal: Risk for impaired skin integrity will decrease Outcome: Adequate for Discharge   Problem: Tissue Perfusion: Goal: Adequacy of tissue perfusion will improve Outcome: Adequate for Discharge   Problem: Education: Goal: Understanding of cardiac disease, CV risk reduction, and recovery process will improve Outcome: Adequate for Discharge Goal: Individualized Educational Video(s) Outcome: Adequate for Discharge   Problem: Activity: Goal: Ability to tolerate increased activity will improve Outcome: Adequate for Discharge   Problem: Cardiac: Goal: Ability to achieve and maintain adequate cardiovascular perfusion will improve Outcome: Adequate for Discharge   Problem: Health Behavior/Discharge Planning: Goal: Ability to safely manage health-related needs after discharge  will improve Outcome: Adequate for Discharge   Problem: Education: Goal: Knowledge of General Education information will improve Description: Including pain rating scale, medication(s)/side effects and non-pharmacologic comfort measures Outcome: Adequate for Discharge   Problem: Health Behavior/Discharge Planning: Goal: Ability to manage health-related needs will improve Outcome: Adequate for Discharge   Problem: Clinical Measurements: Goal: Ability to maintain clinical measurements within normal limits will improve Outcome: Adequate for Discharge Goal: Will remain free from infection Outcome: Adequate for Discharge Goal: Diagnostic test results will improve Outcome: Adequate for Discharge Goal: Respiratory complications will improve Outcome: Adequate for Discharge Goal: Cardiovascular complication will be avoided Outcome: Adequate for Discharge   Problem: Activity: Goal: Risk for activity intolerance will decrease Outcome: Adequate for Discharge   Problem: Nutrition: Goal: Adequate nutrition will be maintained Outcome: Adequate for Discharge   Problem: Coping: Goal: Level of anxiety will decrease Outcome: Adequate for Discharge   Problem: Elimination: Goal: Will not experience complications related to bowel motility Outcome: Adequate for Discharge Goal: Will not experience complications related to urinary retention Outcome: Adequate for Discharge   Problem: Pain Managment: Goal: General experience of comfort will improve and/or be controlled Outcome: Adequate for Discharge   Problem: Safety: Goal: Ability to remain free from injury will improve Outcome: Adequate for Discharge   Problem: Skin Integrity: Goal: Risk for impaired skin integrity will decrease Outcome: Adequate for Discharge   Problem: Education: Goal: Understanding of CV disease, CV risk reduction, and recovery process will improve Outcome: Adequate for Discharge Goal: Individualized Educational  Video(s) Outcome: Adequate for Discharge   Problem: Activity: Goal: Ability to return to baseline activity level will improve Outcome: Adequate for Discharge   Problem: Cardiovascular: Goal: Ability to achieve and maintain adequate cardiovascular perfusion will improve Outcome: Adequate for Discharge Goal: Vascular access site(s) Level 0-1 will be maintained Outcome: Adequate for Discharge   Problem: Health Behavior/Discharge Planning: Goal: Ability to safely manage health-related needs after discharge will improve Outcome: Adequate for  Discharge

## 2024-08-14 NOTE — Progress Notes (Addendum)
 Progress Note  Patient Name: Tanya Walsh Date of Encounter: 08/14/2024 [facility] Cardiologist: None   Interval Summary    Feels well this morning, no issues overnight.   Vital Signs Vitals:   08/13/24 2132 08/13/24 2337 08/14/24 0502 08/14/24 0800  BP:  126/74 (!) 141/85   Pulse: 64 (!) 57 66   Resp: (!) 21 18 18    Temp:  98.8 F (37.1 C) 98.4 F (36.9 C) 98.7 F (37.1 C)  TempSrc:  Oral Oral Oral  SpO2: 99% 92% 95%   Weight:      Height:        Intake/Output Summary (Last 24 hours) at 08/14/2024 0900 Last data filed at 08/14/2024 0502 Gross per 24 hour  Intake 268.04 ml  Output 1100 ml  Net -831.96 ml      08/12/2024    7:21 PM 08/10/2024    5:22 AM 06/04/2024    4:57 PM  Last 3 Weights  Weight (lbs) 169 lb 12.1 oz 169 lb 8.5 oz 177 lb 11.1 oz  Weight (kg) 77 kg 76.9 kg 80.6 kg      Telemetry/ECG   Sinus Rhythm - Personally Reviewed  Physical Exam  GEN: No acute distress.   Neck: No JVD Cardiac: RRR, no murmurs, rubs, or gallops.  Respiratory: Clear to auscultation bilaterally. GI: Soft, nontender, non-distended  MS: No edema  Assessment & Plan   68 y.o. female Jehovah witness with a hx of CAD NSTEMI 12/2023 (was treated medically) with prior MI in [place]  (details unknown), HTN, HLD, OSA, metastatic neuroendocrine tumor of small intestine, history of GIB who was seen 08/10/2024 for the evaluation of chest pain   NSTEMI CAD -- LHC 12/2023 50% ostial circumflex lesion with 30% distal left main to ostial LAD lesion and 70% proximal RCA lesion treated medically with Plavix  and Imdur .  -- presented this admission with chest pain last evening w/ associated diaphoresis and generalized weakness that improved with SL nitroglycerin   -- hsTn 8>>24>>97>>455 -- 2D echo 08/10/2024: EF 60 to 65% with no focal wall motion abnormalities, normal diastolic function, mild MR -- Underwent cardiac catheterization 12/1 with severe single-vessel CAD of 90%  proximal RCA as well as ostial/proximal RCA vasospasm.  Successful PCI/DES x 1 to proximal RCA.  Recommendations for DAPT with aspirin  and Plavix  for 1 month ideally longer if able to tolerate from bleeding standpoint. -- Continue aspirin  81 mg daily, Plavix  75 mg daily, Imdur  15 mg daily, Crestor  20 mg daily  HTN -- mildly elevated this morning -- Resume losartan  25 mg daily, continue Imdur  15 mg daily.  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 Lanny  -- of note, she was started on Lutathera  and shortly afterwards experienced NSTEMI with severe hyperglycemia and treatment was stopped.  -- CT abd/pelvis 12/2023 noted re-demonstration of ill-defined hypoattenuating lesion in the right hepatic lobe along with stable 8 x 10 mm left adrenal nodule -- PET scan on April 05, 2024 showed improved peritoneal metastasis, and similar hypermetabolic liver met. No new lesions --CT of the abdomen pelvis 08/10/2024 showed new nodules in the central small bowel mesentery measuring up to 10 mm suspicious for peritoneal or lymph node metastasis and  a stable metastatic lesion to posterior hepatic lobe and 1 cm left adrenal nodule -- management per TRH and oncology   Will arrange outpatient follow up   For questions or updates, please contact [facility] Please consult [email or web address] for contact info under   Signed, Manuelita Rummer, NP    ATTENDING ATTESTATION:  After conducting a review of all available clinical information with the care team, interviewing the patient, and performing a physical exam, I agree with the findings and plan described in this note.   GEN: No acute distress, AO x 3 HEENT:  MMM, no JVD,  no scleral icterus Cardiac: RRR, no murmurs, rubs, or gallops.  Respiratory: Clear to auscultation bilaterally. GI: Soft, nontender, non-distended  MS: No edema; No deformity. Neuro:  Nonfocal  Vasc:  +2 radial pulses  Patient doing well after uncomplicated PCI of RCA.  Patient did develop severe vasospasm so Imdur  was added to her regimen.   RFA access site remains stable.  Agree with plan as above, continue current medical therapy.  OK for discharge today with close hospital follow up.  Lurena Red, MD Pager [phone]

## 2024-08-14 NOTE — Progress Notes (Signed)
 Explained discharge instructions to patient. Reviewed follow up appointment and next medication administration times. Also reviewed education. Patient verbalized having an understanding for instructions given. All belongings are in the patient's possession. Will pick up her TOC meds on the way down to the discharge lounge to await her ride. IV and telemetry were removed. CCMD was notified. No other needs verbalized. Awaiting transportation downstairs for discharge.

## 2024-08-14 NOTE — TOC Initial Note (Signed)
 Transition of Care [facility]) - Initial/Assessment Note    Patient Details  Name: Tanya Walsh MRN: [id number] Date of Birth: 02/15/1956  Transition of Care [facility]) CM/SW Contact:    Sudie Erminio Deems, RN Phone Number: 08/14/2024, 11:54 AM  Clinical Narrative:  Patient presented for chest pain-Nstemi. PTA patient was from home with family support. Patient has PCP and has no issues with transportation. Patient has insurance and states she can afford medications. Patient is not active with any home health agencies. No home needs identified at this time.                Expected Discharge Plan: Home/Self Care Barriers to Discharge: No Barriers Identified  Patient Goals and CMS Choice Patient states their goals for this hospitalization and ongoing recovery are:: plan to return home   Choice offered to / list presented to : NA     Expected Discharge Plan and Services In-house Referral: NA Discharge Planning Services: CM Consult Post Acute Care Choice: NA Living arrangements for the past 2 months: Apartment Expected Discharge Date: 08/14/24                   [facility] Arranged: NA  Prior Living Arrangements/Services Living arrangements for the past 2 months: Apartment Lives with:: Self, Friends Patient language and need for interpreter reviewed:: Yes Do you feel safe going back to the place where you live?: Yes      Need for Family Participation in Patient Care: No (Comment) Care giver support system in place?: No (comment)   Criminal Activity/Legal Involvement Pertinent to Current Situation/Hospitalization: No - Comment as needed  Activities of Daily Living   ADL Screening (condition at time of admission) Independently performs ADLs?: Yes (appropriate for developmental age) Is the patient deaf or have difficulty hearing?: No Does the patient have difficulty seeing, even when wearing glasses/contacts?: No Does the patient have difficulty concentrating, remembering, or making  decisions?: No  Permission Sought/Granted Permission sought to share information with : Family Supports, Case Manager     Emotional Assessment Appearance:: Appears stated age Attitude/Demeanor/Rapport: Engaged Affect (typically observed): Appropriate Orientation: : Oriented to Self, Oriented to Place, Oriented to  Time, Oriented to Situation Alcohol / Substance Use: Not Applicable Psych Involvement: No (comment)  Admission diagnosis:  Chest pain [R07.9] Nonspecific chest pain [R07.9] Patient Active Problem List   Diagnosis Date Noted   Chest pain 08/10/2024   Unstable angina (HCC) 08/10/2024   NSTEMI (non-ST elevated myocardial infarction) (HCC) 08/10/2024   Obesity (BMI 30-39.9) 12/30/2023   ACS (acute coronary syndrome) (HCC) 12/29/2023   Cerebral aneurysm 12/14/2023   CAD (coronary artery disease) 03/10/2021   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 (HCC) 03/10/2021   OSA (obstructive sleep apnea) 03/10/2021   Primary malignant neuroendocrine tumor of small intestine (HCC) 02/26/2021   GIB (gastrointestinal bleeding) 12/09/2020   Fibromyalgia 11/26/2020   MDD (major depressive disorder) 03/05/2020   Nocturia 08/14/2019   History of anemia 03/09/2017   GERD (gastroesophageal reflux disease) 11/19/2015   Melena 07/17/2015   Incidental lung nodule, less than or equal to 3mm 07/17/2015   Type 2 diabetes mellitus with hyperglycemia, without long-term current use of insulin  (HCC) 04/22/2015   Low back pain 04/18/2015   Chest pain at rest 04/16/2014   Essential hypertension 04/16/2014   Morbid obesity (HCC) 04/16/2014   Family history of early CAD 04/16/2014   PCP:  [name], MD Pharmacy:   [phone] [place],  - [address]  AT CORNER OF [facility] [address] [place] [address] Phone: [phone] Fax: [phone]  [id number] [place], [place] - [address] AT [address]NER OF [facility] [address] [place] [address] Phone: [phone] Fax: [phone]  [id number] [place], [place] - [address] AT [facility] [address] [place] [address] Phone: [phone] Fax: [phone]  [facility] [address] [place] [address] Phone: [phone] Fax: [phone]     Social Drivers of Health (SDOH) Social History: SDOH Screenings   Food Insecurity: No Food Insecurity (08/10/2024)  Housing: Low Risk  (08/13/2024)  Transportation Needs: No Transportation Needs (08/10/2024)  Utilities: Not At Risk (08/10/2024)  Depression (PHQ2-9): Low Risk  (07/12/2024)  Financial Resource Strain: Low Risk  (12/14/2023)   Received from [facility]  Physical Activity: Insufficiently Active (12/14/2023)   Received from [facility]  Social Connections: Moderately Integrated (08/13/2024)  Stress: Stress Concern Present (12/14/2023)   Received from [facility]  Tobacco Use: Low Risk  (08/10/2024)   SDOH Interventions:     Readmission Risk Interventions     No data to display

## 2024-08-14 NOTE — Progress Notes (Signed)
 CARDIAC REHAB PHASE I   PRE:  Rate/Rhythm: 69 SR    BP: sitting 126/78    SpO2:   MODE:  Ambulation: 400 ft   POST:  Rate/Rhythm: 96 SR    BP: sitting 123/76     SpO2:    Pt tolerated well, she was stiff initially, complaining of numb left foot. Ambulated with RW slowly, no real assist. Denied SOB or CP.  Discussed with pt MI, stent, DAPT importance, diet, exercise, NTG and CRPII. Pt receptive. Will refer to [facility] CRPII. She did program recently. [phone]  Aliene Aris BS, ACSM-CEP 08/14/2024 8:13 AM

## 2024-08-15 ENCOUNTER — Other Ambulatory Visit (HOSPITAL_COMMUNITY): Payer: Self-pay

## 2024-08-15 ENCOUNTER — Encounter (HOSPITAL_COMMUNITY): Payer: Self-pay

## 2024-08-15 NOTE — Discharge Summary (Signed)
 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 *  Hospital Course: HPI: Tanya Walsh is a 68 y.o. female with medical history significant for hypertension, type 2 diabetes mellitus, CAD, and neuroendocrine tumor with metastases who presents with chest pain.   Patient reports that she was in her usual state of health and had been having a good day when she developed chest pain yesterday evening.  She had just returned home and changed her close when she developed chest pressure with diaphoresis and generalized weakness shortly before 4 PM.  Symptoms began to improve after laying down, she went to sleep, but then woke with the same symptoms.  She reports that the symptoms are the same as what she experienced in April when she was admitted with NSTEMI, though not as severe as they were then.   She was treated with nitroglycerin  x 1 and 324 mg aspirin  by EMS prior to arrival in the ED.  She reports that the symptoms improved with nitroglycerin .   ED Course: Upon arrival to the ED, patient is found to be afebrile and saturating mid 90s on room air with heart rate in the 50s and stable blood pressure.  Labs are most notable for potassium 3.4, glucose 226, normal CBC, and troponin of 8 which increased to 24 and then 97.  EKG demonstrates sinus  rhythm and chest x-ray is negative for acute findings.   Cardiology was consulted by the ED physician, aspirin  and fentanyl  were administered, and the patient was started on IV heparin  infusion.  Cardiology planned for left heart catheterization for 08/13/2024  Assessment and Plan: No notes have been filed under this hospital service. Service: Hospitalist        Consultants: Cardiology Procedures performed: Cardiac cath Disposition: Home Diet recommendation:  Cardiac diet DISCHARGE MEDICATION: Allergies as of 08/14/2024       Reactions   Bactrim [sulfamethoxazole-trimethoprim] Swelling        Medication List     STOP taking these medications    losartan -hydrochlorothiazide  50-12.5 MG tablet Commonly known as: HYZAAR       TAKE these medications    acetaminophen  650 MG CR tablet Commonly known as: TYLENOL  Take 650 mg by mouth every 8 (eight) hours as needed for pain.   aspirin  EC 81 MG tablet Take 1 tablet (81 mg total) by mouth daily. Swallow whole.   clopidogrel  75 MG tablet Commonly known as: PLAVIX  Take 1 tablet (75 mg total) by mouth daily.   cyclobenzaprine  5 MG tablet Commonly known as: FLEXERIL  Take 1 tablet (5 mg total) by mouth 3 (three) times daily as needed for muscle spasms.   DULoxetine  60 MG capsule Commonly known as: CYMBALTA  Take 60 mg by mouth in the morning.   ezetimibe 10 MG tablet Commonly known as: ZETIA Take 1 tablet (10 mg total) by mouth daily.  fluticasone 50 MCG/ACT nasal spray Commonly known as: FLONASE Place 1 spray into both nostrils as needed for allergies or rhinitis.   folic acid  800 MCG tablet Commonly known as: FOLVITE  Take 800 mcg by mouth in the morning.   gabapentin  100 MG capsule Commonly known as: NEURONTIN  TAKE 1 CAPSULE BY MOUTH AT BEDTIME.   isosorbide  mononitrate 30 MG 24 hr tablet Commonly known as: IMDUR  Take 1 tablet (30 mg total) by mouth daily. What changed:  how much to take additional  instructions   losartan  25 MG tablet Commonly known as: COZAAR  Take 1 tablet (25 mg total) by mouth daily.   metFORMIN  500 MG 24 hr tablet Commonly known as: GLUCOPHAGE -XR Take 500 mg by mouth 2 (two) times daily with a meal.   Multivitamin Adults 50+ Tabs Take 1 tablet by mouth daily.   nitroGLYCERIN  0.4 MG SL tablet Commonly known as: NITROSTAT  Place 1 tablet (0.4 mg total) under the tongue every 5 (five) minutes x 3 doses as needed for chest pain. What changed: when to take this   ondansetron  8 MG tablet Commonly known as: ZOFRAN  Take 1 tablet (8 mg total) by mouth 2 (two) times daily as needed for nausea or vomiting.   pantoprazole  40 MG tablet Commonly known as: PROTONIX  Take 1 tablet (40 mg total) by mouth daily.   rosuvastatin  40 MG tablet Commonly known as: CRESTOR  Take 1 tablet (40 mg total) by mouth daily. What changed:  medication strength how much to take when to take this   traMADol  50 MG tablet Commonly known as: ULTRAM  Take 1 tablet (50 mg total) by mouth every 12 (twelve) hours as needed. What changed: reasons to take this   trolamine salicylate 10 % cream Commonly known as: ASPERCREME Apply 1 application topically as needed for muscle pain.        Discharge Exam: Filed Weights   08/10/24 0522 08/12/24 1921  Weight: 76.9 kg 77 kg   .General  No acute Distress Eyes: PERRL, lids and conjunctivae normal ENMT: Mucous membranes are moist.   Neck: normal, supple, no masses, no thyromegaly Respiratory: clear to auscultation bilaterally, no wheezing, no crackles. Normal respiratory effort. No accessory muscle use.  Cardiovascular: Regular rate and rhythm, no murmurs / rubs / gallops Abdomen: Soft, nontender nondistended. Musculoskeletal: no clubbing / cyanosis. No joint deformity upper and lower extremities.  Skin: no rashes, lesions, ulcers. No induration Neurologic: Facial asymmetry, moving extremity spontaneously, speech fluent. Psychiatric:  Normal judgment and insight. Alert and oriented x 3. Normal mood.    Condition at discharge: good  The results of significant diagnostics from this hospitalization (including imaging, microbiology, ancillary and laboratory) are listed below for reference.   Imaging Studies: CARDIAC CATHETERIZATION Result Date: 08/13/2024   Dist LM to Ost LAD lesion is 30% stenosed.   Ost Cx lesion is 50% stenosed.   Prox RCA lesion is 90% stenosed.   Ost RCA to Prox RCA lesion is 20% stenosed.   A drug-eluting stent was successfully placed using a STENT ONYX FRONTIER 3.5X30.   Post intervention, there is a 0% residual stenosis.   LV end diastolic pressure is normal.   There is no aortic valve stenosis.   In the absence of any other complications or medical issues, we expect the patient to be ready for discharge from an interventional cardiology perspective on 08/14/2024.   Recommend uninterrupted dual antiplatelet therapy with Aspirin  81mg  daily and Clopidogrel  75mg  daily for a minimum of 12 months (ACS-Class I recommendation).  If concern for bleeding persists, DAPT can be shortened but must be continued for at least 1 month (and ideally at least 3 months, after which time aspirin  could be discontinued). Conclusions: Severe single-vessel coronary artery disease with 90% proximal RCA disease as well as ostial/proximal RCA vasospasm leading to up to 75% stenosis.  Left coronary artery shows mild-moderate, non-obstructive disease. Normal left ventricular filling pressure (LVEDP 7 mmHg). Left radial artery loop necessitating transition to femoral access. Successful PCI to proximal RCA using Onyx Frontier 3.5 x 30 mm drug-eluting stent with 0% residual stenosis and TIMI-3 flow.  Mild stenosis (~20%) proximal to stent is consistent with residual vasospasm, given response to intracoronary nitroglycerin  and absence of dissection on OCT. Recommendation: Continue dual antiplatelet therapy with aspirin  and clopidogrel  for at least 1  month, ideally longer if tolerated from a bleeding standpoint. Aggressive secondary prevention of coronary artery disease. Lonni Hanson, MD [facility]  CT ABDOMEN PELVIS W CONTRAST Result Date: 08/11/2024 CLINICAL DATA:  Abdominal pain. Metastatic small bowel neuroendocrine tumor. * Tracking Code: BO * EXAM: CT ABDOMEN AND PELVIS WITH CONTRAST TECHNIQUE: Multidetector CT imaging of the abdomen and pelvis was performed using the standard protocol following bolus administration of intravenous contrast. RADIATION DOSE REDUCTION: This exam was performed according to the departmental dose-optimization program which includes automated exposure control, adjustment of the mA and/or kV according to patient size and/or use of iterative reconstruction technique. CONTRAST:  75mL OMNIPAQUE  IOHEXOL  350 MG/ML SOLN COMPARISON:  12/29/2023 FINDINGS: Lower Chest: No acute findings. Hepatobiliary: Heterogeneous hypervascular lesion measuring 2.2 x 1.9 cm in the posterior right hepatic lobe shows no significant change. No new or enlarging liver lesions identified. Gallbladder is unremarkable. No evidence of biliary ductal dilatation. Pancreas:  No mass or inflammatory changes. Spleen: Within normal limits in size and appearance. Adrenals/Urinary Tract: Stable 1 cm left adrenal nodule. No suspicious renal masses identified. No evidence of ureteral calculi or hydronephrosis. Stomach/Bowel: No evidence of obstruction, inflammatory process or abnormal fluid collections. Vascular/Lymphatic: Soft tissue nodule or lymph node with central focus of calcification in the central small bowel mesentery measures 10 x 9 mm and appears new since previous study. Adjacent 5 mm mesenteric lymph node also appears new. These findings may be due to peritoneal metastases or lymph node metastases. No acute vascular findings. Reproductive: Prior hysterectomy noted. Adnexal regions are unremarkable in appearance. Other:  None. Musculoskeletal:  No  suspicious bone lesions identified. IMPRESSION: New nodules in the central small bowel mesentery measuring up to 10 mm, suspicious for peritoneal or lymph node metastases. Stable small metastatic lesion in posterior right hepatic lobe. Stable 1 cm left adrenal nodule. Electronically Signed   By: Norleen DELENA Kil M.D.   On: 08/11/2024 07:22   ECHOCARDIOGRAM COMPLETE Result Date: 08/10/2024    ECHOCARDIOGRAM REPORT   Patient Name:   KHAILEE MICK Date of Exam: 08/10/2024 Medical Rec #:  [id number]         Height:       63.0 in Accession #:    [id number]        Weight:       169.5 lb Date of Birth:  [birth year]         BSA:          1.802 m Patient Age:    68 years          BP:           105/75 mmHg Patient Gender: F  HR:           56 bpm. Exam Location:  Inpatient Procedure: 2D Echo, Cardiac Doppler and Color Doppler (Both Spectral and Color            Flow Doppler were utilized during procedure). Indications:    Chest Pain R07.9  History:        Patient has prior history of Echocardiogram examinations, most                 recent 12/30/2023. CAD and Previous Myocardial Infarction; Risk                 Factors:Hypertension.  Sonographer:    Jayson Gaskins Referring Phys: [id number] HUSAM M [facility] IMPRESSIONS  1. Left ventricular ejection fraction, by estimation, is 60 to 65%. Left ventricular ejection fraction by PLAX is 60 %. The left ventricle has normal function. The left ventricle has no regional wall motion abnormalities. There is mild concentric left ventricular hypertrophy. Left ventricular diastolic parameters were normal.  2. Right ventricular systolic function is normal. The right ventricular size is normal. Tricuspid regurgitation signal is inadequate for assessing PA pressure.  3. The mitral valve is degenerative. Mild mitral valve regurgitation. No evidence of mitral stenosis.  4. The aortic valve is tricuspid. There is no evidence of aortic regurgitation or stenosis. There is nodular  calcification on the non-coronary cusp of the aortic valve.  5. The inferior vena cava is normal in size with greater than 50% respiratory variability, suggesting right atrial pressure of 3 mmHg. Comparison(s): No significant change from prior study. Conclusion(s)/Recommendation(s): Otherwise normal echocardiogram, with minor abnormalities described in the report. FINDINGS  Left Ventricle: Left ventricular ejection fraction, by estimation, is 60 to 65%. Left ventricular ejection fraction by PLAX is 60 %. The left ventricle has normal function. The left ventricle has no regional wall motion abnormalities. The left ventricular internal cavity size was normal in size. There is mild concentric left ventricular hypertrophy. Left ventricular diastolic parameters were normal. Right Ventricle: The right ventricular size is normal. No increase in right ventricular wall thickness. Right ventricular systolic function is normal. Tricuspid regurgitation signal is inadequate for assessing PA pressure. Left Atrium: Left atrial size was normal in size. Right Atrium: Right atrial size was normal in size. Pericardium: There is no evidence of pericardial effusion. Mitral Valve: The mitral valve is degenerative in appearance. Mild mitral annular calcification. Mild mitral valve regurgitation. No evidence of mitral valve stenosis. Tricuspid Valve: The tricuspid valve is normal in structure. Tricuspid valve regurgitation is trivial. No evidence of tricuspid stenosis. Aortic Valve: The aortic valve is tricuspid. There is no evidence of aortic regurgitation or stenosis. There is nodular calcification on the non-coronary cusp of the aortic valve. Aortic valve mean gradient measures 2.0 mmHg. Aortic valve peak gradient measures 3.5 mmHg. Aortic valve area, by VTI measures 2.45 cm. Pulmonic Valve: The pulmonic valve was normal in structure. Pulmonic valve regurgitation is mild. No evidence of pulmonic stenosis. Aorta: The aortic root is  normal in size and structure. Venous: The inferior vena cava is normal in size with greater than 50% respiratory variability, suggesting right atrial pressure of 3 mmHg. IAS/Shunts: No atrial level shunt detected by color flow Doppler.  LEFT VENTRICLE PLAX 2D LV EF:         Left            Diastology                ventricular  LV e' medial:    5.98 cm/s                ejection        LV E/e' medial:  10.3                fraction by     LV e' lateral:   10.30 cm/s                PLAX is 60      LV E/e' lateral: 6.0                %. LVIDd:         4.10 cm LVIDs:         2.80 cm LV PW:         1.00 cm LV IVS:        1.10 cm LVOT diam:     1.80 cm LV SV:         53 LV SV Index:   30 LVOT Area:     2.54 cm LV IVRT:       67 msec  RIGHT VENTRICLE RV S prime:     14.40 cm/s  PULMONARY VEINS TAPSE (M-mode): 2.2 cm      Diastolic Velocity: 47.70 cm/s                             S/D Velocity:       1.10                             Systolic Velocity:  53.50 cm/s LEFT ATRIUM             Index        RIGHT ATRIUM           Index LA Vol (A2C):   29.0 ml 16.09 ml/m  RA Area:     10.20 cm LA Vol (A4C):   30.7 ml 17.03 ml/m  RA Volume:   20.70 ml  11.48 ml/m LA Biplane Vol: 31.0 ml 17.20 ml/m  AORTIC VALVE AV Area (Vmax):    2.44 cm AV Area (Vmean):   2.30 cm AV Area (VTI):     2.45 cm AV Vmax:           93.20 cm/s AV Vmean:          66.500 cm/s AV VTI:            0.217 m AV Peak Grad:      3.5 mmHg AV Mean Grad:      2.0 mmHg LVOT Vmax:         89.40 cm/s LVOT Vmean:        60.200 cm/s LVOT VTI:          0.209 m LVOT/AV VTI ratio: 0.96  AORTA Ao Root diam: 2.90 cm MITRAL VALVE MV Area (PHT): 1.89 cm    SHUNTS MV Decel Time: 401 msec    Systemic VTI:  0.21 m MV E velocity: 61.30 cm/s  Systemic Diam: 1.80 cm MV A velocity: 57.10 cm/s MV E/A ratio:  1.07 Georganna Archer Electronically signed by Georganna Archer Signature Date/Time: 08/10/2024/10:04:30 AM    Final    DG Chest Port 1 View Result Date: 08/09/2024 EXAM:  1 VIEW(S) XRAY OF THE CHEST 08/09/2024 09:50:00 PM COMPARISON: Comparison with 12/29/2023. CLINICAL HISTORY: cp FINDINGS: LUNGS AND PLEURA:  No focal pulmonary opacity. No pleural effusion. No pneumothorax. HEART AND MEDIASTINUM: No acute abnormality of the cardiac and mediastinal silhouettes. BONES AND SOFT TISSUES: No acute osseous abnormality. IMPRESSION: 1. No acute cardiopulmonary process. Electronically signed by: Elsie Gravely MD 08/09/2024 09:56 PM EST RP Workstation: [id number]    Microbiology: Results for orders placed or performed during the hospital encounter of 04/09/23  SARS CORONAVIRUS 2 (TAT 6-24 HRS) Anterior Nasal Swab     Status: Abnormal   Collection Time: 04/09/23  5:12 PM   Specimen: Anterior Nasal Swab  Result Value Ref Range Status   SARS Coronavirus 2 POSITIVE (A) NEGATIVE Final    Comment: (NOTE) SARS-CoV-2 target nucleic acids are DETECTED.  The SARS-CoV-2 RNA is generally detectable in upper and lower respiratory specimens during the acute phase of infection. Positive results are indicative of the presence of SARS-CoV-2 RNA. Clinical correlation with patient history and other diagnostic information is  necessary to determine patient infection status. Positive results do not rule out bacterial infection or co-infection with other viruses.  The expected result is Negative.  Fact Sheet for Patients: hairslick.no  Fact Sheet for Healthcare Providers: quierodirigir.com  This test is not yet approved or cleared by the [place]  FDA and  has been authorized for detection and/or diagnosis of SARS-CoV-2 by FDA under an Emergency Use Authorization (EUA). This EUA will remain  in effect (meaning this test can be used) for the duration of the COVID-19 declaration under Section 564(b)(1) of the Act, 21 U. S.C. section 360bbb-3(b)(1), unless the authorization is terminated or revoked sooner.   Performed at  [facility], [address], [place], [place] [address]     Labs: CBC: Recent Labs  Lab 08/09/24 2202 08/11/24 0323 08/12/24 0241 08/13/24 0334 08/14/24 0425  WBC 5.1 5.2 5.2 4.9 5.8  HGB 12.9 12.5 12.9 13.3 12.2  HCT 39.3 37.7 38.3 40.3 36.7  MCV 90.6 88.1 87.2 88.2 89.1  PLT 284 275 274 276 263   Basic Metabolic Panel: Recent Labs  Lab 08/09/24 2202 08/11/24 0323 08/12/24 0241 08/13/24 0653 08/14/24 0425  NA 137 137 135 138 138  K 3.4* 3.6 3.4* 3.6 4.0  CL 101 104 100 98 103  CO2 25 24 24 28 27   GLUCOSE 226* 120* 131* 130* 136*  BUN 11 7* 12 12 9   CREATININE 0.98 0.98 1.07* 1.01* 0.98  CALCIUM  9.0 8.9 8.9 8.9 8.7*  MG  --  2.0 1.8  --  1.9   Liver Function Tests: No results for input(s): AST, ALT, ALKPHOS, BILITOT, PROT, ALBUMIN in the last 168 hours. CBG: Recent Labs  Lab 08/13/24 2017 08/13/24 2341 08/14/24 0501 08/14/24 0810 08/14/24 1127  GLUCAP 136* 151* 146* 132* 151*    Discharge time spent: greater than 30 minutes.  Signed: Landon FORBES Baller, MD [facility] 08/15/2024

## 2024-08-30 ENCOUNTER — Ambulatory Visit: Admitting: Physician Assistant

## 2024-08-30 ENCOUNTER — Encounter: Payer: Self-pay | Admitting: Physician Assistant

## 2024-08-30 VITALS — BP 132/88 | HR 78 | Ht 63.0 in | Wt 169.4 lb

## 2024-08-30 DIAGNOSIS — C7B8 Other secondary neuroendocrine tumors: Secondary | ICD-10-CM

## 2024-08-30 DIAGNOSIS — Z8249 Family history of ischemic heart disease and other diseases of the circulatory system: Secondary | ICD-10-CM

## 2024-08-30 DIAGNOSIS — E785 Hyperlipidemia, unspecified: Secondary | ICD-10-CM | POA: Diagnosis not present

## 2024-08-30 DIAGNOSIS — I214 Non-ST elevation (NSTEMI) myocardial infarction: Secondary | ICD-10-CM

## 2024-08-30 DIAGNOSIS — I2510 Atherosclerotic heart disease of native coronary artery without angina pectoris: Secondary | ICD-10-CM

## 2024-08-30 DIAGNOSIS — I10 Essential (primary) hypertension: Secondary | ICD-10-CM | POA: Diagnosis not present

## 2024-08-30 NOTE — Progress Notes (Unsigned)
 Cardiology Office Note   Date:  08/30/2024  ID:  Tanya, Walsh 07/24/1956, MRN [id number] PCP: [name], MD  [facility] Providers Cardiologist:  [name], MD Sleep Medicine:  Wilbert Bihari, MD { Click to update primary MD,subspecialty MD or APP then REFRESH:1}    History of Present Illness Tanya Walsh is a 68 y.o. female with past medical history of CAD, hypertension, hyperlipidemia, OSA, GI bleed and metastatic small intestine and liver cancer.  Patient reportedly had a prior MI in [place] , details unknown.  She was admitted in May 2025 with NSTEMI.  Serial troponin trended up to 1600.  CTA was negative for PE.  Subsequent cardiac catheterization showed a 70% RCA lesion, normal LVEDP, 30% stenosis in left main, 50% ostial left circumflex artery.  Decision was made to optimize medical therapy and consider PCI if refractory to medication.  Patient was last seen by Jackee Alberts, NP on 04/30/2024 at which time she was experiencing some occasional palpitation at rest but not with activity.  Imdur  was reduced due to headache.  It was mentioned the patient has discontinued CPAP machine due to discomfort.  Outpatient home sleep study was ordered with plan to restart CPAP therapy.  Venous Doppler obtained on 07/13/2024 showed no evidence of DVT.  She was readmitted with chest pain on 08/09/2024.  Troponin was again elevated.  CT of abdomen and pelvis obtained during the hospitalization showed new nodules in the central small bowel mesentery measuring up to 10 mm suspicious for peritoneal and lymph node metastasis and a stable small metastatic lesion in the posterior right hepatic lobe with 1 cm left adrenal nodule.  Echocardiogram obtained on 08/02/2024 showed EF 60 to 65%, no regional wall motion abnormality, normal diastolic function, moderate MR.  Repeat cardiac catheterization performed on 08/13/2024 showed 30% distal left main and ostial LAD, 50% ostial left circumflex  lesion, 90% proximal RCA lesion treated with 3.5 x 30 mm Onyx frontier DES.  It was recommended to continue aspirin  and Plavix  therapy for 1 year.  If concern for bleed bleeding persist, DAPT can be shortened but must be continued for at least 1 month (and ideally at least 3 months after which time aspirin  could be discontinued).  During the cath, patient did develop severe vasospasm, therefore Imdur  was added back to her medical regimen.  Patient presents today for follow-up.  She has very mild intermittent chest discomfort however does not occur with physical activity.  She has occasional bruising in her leg but no major bleeding issue.  She has upcoming visit with Dr. Lanny who can review the recent CT of abdomen that showed new lesion in the bowel area.  Overall, she is doing well from the cardiac perspective.  Her cholesterol was recently uptitrated, she will need a fasting lipid panel and LFT in 3 months.  Otherwise, she can follow-up with Dr. Lawrence in 3 to 4 months.  She does have mild headache the low-dose Imdur  however it is tolerable.  ROS: ***  Studies Reviewed      *** Risk Assessment/Calculations {Does this patient have ATRIAL FIBRILLATION?[phone]}         Physical Exam VS:  BP 132/88 (BP Location: Right Arm, Patient Position: Sitting, Cuff Size: Normal)   Pulse 78   Ht 5' 3 (1.6 m)   Wt 169 lb 6.4 oz (76.8 kg)   SpO2 94%   BMI 30.01 kg/m        Wt Readings from Last  3 Encounters:  08/30/24 169 lb 6.4 oz (76.8 kg)  08/12/24 169 lb 12.1 oz (77 kg)  06/04/24 177 lb 11.1 oz (80.6 kg)    GEN: Well nourished, well developed in no acute distress NECK: No JVD; No carotid bruits CARDIAC: ***RRR, no murmurs, rubs, gallops RESPIRATORY:  Clear to auscultation without rales, wheezing or rhonchi  ABDOMEN: Soft, non-tender, non-distended EXTREMITIES:  No edema; No deformity   ASSESSMENT AND PLAN *** {The patient has an active order for outpatient cardiac rehabilitation.    Please indicate if the patient is ready to start. Do NOT delete this.  It will auto delete.  Refresh note, then sign.              Click here to document readiness and see contraindications.  :1}  Cardiac Rehabilitation Eligibility Assessment      {Are you ordering a CV Procedure (e.g. stress test, cath, DCCV, TEE, etc)?   Press F2        :[id number]}  Dispo: ***  Signed, Scot Ford, PA

## 2024-08-30 NOTE — Patient Instructions (Signed)
 Medication Instructions:  NO CHANGES *If you need a refill on your cardiac medications before your next appointment, please call your pharmacy*  Lab Work: FASTING LIPID PANEL AND LFT IN 3 MONTHS If you have labs (blood work) drawn today and your tests are completely normal, you will receive your results only by: MyChart Message (if you have MyChart) OR A paper copy in the mail If you have any lab test that is abnormal or we need to change your treatment, we will call you to review the results.  Testing/Procedures: NO TESTING  Follow-Up: At [facility], you and your health needs are our priority.  As part of our continuing mission to provide you with exceptional heart care, our providers are all part of one team.  This team includes your primary Cardiologist (physician) and Advanced Practice Providers or APPs (Physician Assistants and Nurse Practitioners) who all work together to provide you with the care you need, when you need it.  Your next appointment:   3-4 month(s)  Provider:   [name], MD

## 2024-09-03 ENCOUNTER — Telehealth: Payer: Self-pay

## 2024-09-03 NOTE — Telephone Encounter (Signed)
 Pt called stating she's been in the hospital d/t another heart attack.  Pt stated while in the hospital she did a CT 08/10/2024 that showed the following:   IMPRESSION: New nodules in the central small bowel mesentery measuring up to 10 mm, suspicious for peritoneal or lymph node metastases.   Stable small metastatic lesion in posterior right hepatic lobe.   Stable 1 cm left adrenal nodule.     Electronically Signed   By: Norleen DELENA Kil M.D.   On: 08/11/2024 07:22   Pt stated her cardiologist wanted the pt to f/u with Dr Lanny regarding the CT results. Pt is scheduled on 09/07/2024 for her Sandostatin  injection.  Pt is scheduled for a f/u with Dr Lanny on 10/12/2024 currently and wanted to know could she be seen earlier.  Stated this nurse will make Dr Lanny and her Team aware and will f/u with the pt in regards to their response.  Pt verbalized understanding and will await a return response.

## 2024-09-04 NOTE — Telephone Encounter (Signed)
 Spoke with pt to make pt aware that Dr Lanny reviewed the recent CT Scan and was aware of the pt's liver & peritoneal metastasis.  Stated that Dr Lanny wasn't real concerned about the CT Scan results and would like to see the pt in clinic as originally scheduled in January 2026.  Pt verbalized understanding and had no further questions or concerns at this time.

## 2024-09-07 ENCOUNTER — Inpatient Hospital Stay: Attending: Hematology

## 2024-09-07 DIAGNOSIS — C7B8 Other secondary neuroendocrine tumors: Secondary | ICD-10-CM | POA: Diagnosis present

## 2024-09-07 DIAGNOSIS — C7A8 Other malignant neuroendocrine tumors: Secondary | ICD-10-CM | POA: Diagnosis present

## 2024-09-07 MED ORDER — OCTREOTIDE ACETATE 30 MG IM KIT
30.0000 mg | PACK | Freq: Once | INTRAMUSCULAR | Status: AC
  Administered 2024-09-07: 30 mg via INTRAMUSCULAR
  Filled 2024-09-07: qty 1

## 2024-09-27 ENCOUNTER — Other Ambulatory Visit: Payer: Self-pay | Admitting: Nurse Practitioner

## 2024-09-27 ENCOUNTER — Telehealth: Payer: Self-pay | Admitting: Physician Assistant

## 2024-09-27 NOTE — Telephone Encounter (Signed)
 Left voice message to call back 11/5

## 2024-09-27 NOTE — Telephone Encounter (Signed)
 Pt c/o BP issue: STAT if pt c/o blurred vision, one-sided weakness or slurred speech.  STAT if BP is GREATER than 180/120 TODAY.  STAT if BP is LESS than 90/60 and SYMPTOMATIC TODAY  1. What is your BP concern? Hypertension   2. Have you taken any BP medication today? yes   3. What are your last 5 BP readings?145/93   4. Are you having any other symptoms (ex. Dizziness, headache, blurred vision, passed out)? Headache   Pt has been having high blood pressures. Please advise.

## 2024-09-28 NOTE — Telephone Encounter (Signed)
 Left message for pt to call.

## 2024-10-04 ENCOUNTER — Ambulatory Visit (HOSPITAL_COMMUNITY)
Admission: RE | Admit: 2024-10-04 | Discharge: 2024-10-04 | Disposition: A | Discharge status: Home / Self Care | Source: Ambulatory Visit | Attending: Hematology | Admitting: Hematology

## 2024-10-04 DIAGNOSIS — C7A8 Other malignant neuroendocrine tumors: Secondary | ICD-10-CM | POA: Insufficient documentation

## 2024-10-04 MED ORDER — IOHEXOL 9 MG/ML PO SOLN
500.0000 mL | ORAL | Status: AC
  Administered 2024-10-04 (×2): 500 mL via ORAL

## 2024-10-04 MED ORDER — IOHEXOL 300 MG/ML  SOLN
100.0000 mL | Freq: Once | INTRAMUSCULAR | Status: AC | PRN
  Administered 2024-10-04: 100 mL via INTRAVENOUS

## 2024-10-06 ENCOUNTER — Other Ambulatory Visit: Payer: Self-pay | Admitting: Nurse Practitioner

## 2024-10-11 ENCOUNTER — Other Ambulatory Visit: Payer: Self-pay | Admitting: Nurse Practitioner

## 2024-10-11 DIAGNOSIS — C7A8 Other malignant neuroendocrine tumors: Secondary | ICD-10-CM

## 2024-10-11 NOTE — Assessment & Plan Note (Addendum)
 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  Liver biopsy was attempted, but the liver lesion was not visible on ultrasound. -She started Sandostatin  injection in June 2022.  Overall tolerating well. Will continue.  -last restaging CT on 09/07/2022 showed stable disease  -She is clinically doing well overall, still has intermittent pain on the left side of abdomen, overall better than last year. -She has developed hyperglycemia, possibly related to Sandostatin  injection, she is on metformin  now.  Discussed diabetic diet, and I encouraged her to exercise. 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  Treatment was subsequently stopped. - Restaging dotatate PET scan on April 05, 2024 showed improved peritoneal metastasis, and similar hypermetabolic liver met.  No new lesions. -Continue Sandostatin  injection monthly -10/12/2024 - recent CT abdomen and pelvis showing not evidence of new metastases. Will continue sandostatin  injections monthly.

## 2024-10-11 NOTE — Progress Notes (Signed)
 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  Liver biopsy was attempted, but the liver lesion was not visible on ultrasound. -She started Sandostatin  injection in June 2022.  Overall tolerating well. Will continue.  -last restaging CT on 09/07/2022 showed stable disease  -She is clinically doing well overall, still has intermittent pain on the left side of abdomen, overall better than last year. -She has developed hyperglycemia, possibly related to Sandostatin  injection, she is on metformin  now.  Discussed diabetic diet, and I encouraged her to exercise. 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  Treatment was subsequently stopped. - Restaging dotatate PET scan on April 05, 2024 showed improved peritoneal metastasis, and similar hypermetabolic liver met.  No new lesions. -Continue Sandostatin  injection monthly -10/12/2024 - recent CT abdomen and pelvis showing not evidence of new metastases. Will continue sandostatin  injections monthly.     Change in stool Patient has noted soft  stools, more narrow in shape. Denies constipation or diarrhea. Will sometimes have rectal  pain prior to  bowel movement.  Does get some improvement after sandostatin  injection. Has not been able to see GI yet due to second heart attack in November. Will continue to take miralax    Primary neuroendocrine tumor of the small intestine CT abdomen pelvis on 10/04/2024.  Result showed essentially stable exam. Stable appearance of solitary subcapsular hypoattenuating lesion in the right hepatic dome. Stable appearance of soft tissue attenuation nodules in the central mesentery. Stable 9 x 10 mm left adrenal nodule which was non tracer avid on prior PET-CT scan. No new metastatic disease identified within the abdomen or pelvis. Will conitnue with monthly Sandostatin  injections.   Mild cytopenias Cbc showing WBC 3.7 with anc 2.1. Her Hgb is 11.4 with Hct 34.7. will continue to monitor closely. Will  continue  monitor closely and adjust treatment with Sandostatin  as indicated.   Plan Labs reviewed.  -mild leukopenia and anemia  -CMP essentially unremarkable.  Reviewed recent CT abdomen and pelvis with the  patient and her family member. No evidence of new metastatic disease in abdomen or pelvis.  Proceed with Sandostatin   injection today.  Continue with labs and Sandostatin   monthly.  Labs, OV and Sandostatin  in 3 months   The patient understands the plans discussed today and is in agreement with them.  She knows to contact our office if she develops concerns prior to her next appointment.  I provided 25 minutes of face-to-face time during this encounter and > 50% was spent counseling as documented under my assessment and plan.    Powell FORBES Lessen, NP  [facility] -  A DEPT OF [facility] [address] [place] [address] Dept: [phone] Dept Fax: [phone]   No orders of the defined types were placed in this encounter.      CHIEF COMPLAINT:  CC: Neuroendocrine tumor of small intestine  Current Treatment: Sandostatin  injection monthly  INTERVAL HISTORY:  Tanya Walsh is here today for repeat clinical assessment.  She was last seen by Dr.Feng on 07/12/2024.  She had CT abdomen pelvis on 10/04/2024.  Result showed essentially stable exam. Stable appearance of solitary subcapsular hypoattenuating lesion in the right hepatic dome. Stable appearance of soft tissue attenuation nodules in the central mesentery. Stable 9 x 10 mm left adrenal nodule which was non tracer avid on prior PET-CT scan. No new metastatic disease identified within the abdomen or pelvis. She continues to have change in bowel habits with soft, narrow calliber stools. Has not been able to see her GI provider yet due to having 2ng heart attack. She denies chest pain, chest pressure, or shortness of breath. She denies headaches or visual disturbances. She denies fevers or chills. She denies pain. Her appetite is good. Her weight has increased 4 pounds over last 2 months.  I have reviewed the past medical history, past surgical history, social history and family history with the patient and they are unchanged from previous note.  ALLERGIES:  is allergic to bactrim [sulfamethoxazole-trimethoprim].  MEDICATIONS:  Current Outpatient Medications  Medication Sig Dispense Refill   acetaminophen  (TYLENOL ) 650 MG CR tablet Take 650 mg by mouth every 8 (eight) hours as needed for pain.     aspirin  EC 81 MG tablet Take 1 tablet (81 mg total) by mouth daily. Swallow whole. 90 tablet 1   clopidogrel  (PLAVIX ) 75 MG tablet TAKE 1 TABLET BY MOUTH EVERY DAY 90 tablet 3   cyclobenzaprine  (FLEXERIL ) 5 MG tablet Take 1 tablet (5 mg total) by mouth 3 (three) times daily as needed for muscle spasms. 30 tablet 0   DULoxetine  (CYMBALTA ) 60 MG capsule Take 60 mg by mouth in the morning.     ezetimibe  (ZETIA ) 10 MG tablet Take 1 tablet (10 mg total) by mouth daily. 90 tablet 1    fluticasone (FLONASE) 50 MCG/ACT nasal spray Place 1 spray into both nostrils as needed for allergies or rhinitis.     folic acid  (FOLVITE ) 800 MCG tablet Take 800 mcg by mouth in the morning.     gabapentin  (NEURONTIN ) 100 MG capsule TAKE 1 CAPSULE BY MOUTH AT BEDTIME. 30 capsule 1   isosorbide  mononitrate (IMDUR ) 30 MG 24 hr tablet Take 1 tablet (30 mg total) by mouth daily. 90 tablet 3   losartan  (COZAAR ) 25 MG tablet Take 1 tablet (25 mg total) by mouth daily. 90 tablet 1   metFORMIN  (GLUCOPHAGE -XR) 500 MG 24 hr tablet Take 500 mg by mouth 2 (two) times daily with a meal.     Multiple Vitamins-Minerals (MULTIVITAMIN ADULTS 50+) TABS Take 1 tablet by mouth daily.     nitroGLYCERIN  (NITROSTAT ) 0.4 MG SL tablet Place 1 tablet (0.4 mg total) under the tongue every 5 (five) minutes x 3 doses as needed for chest pain. 25 tablet 3   ondansetron  (ZOFRAN ) 8 MG tablet Take 1 tablet (8 mg total) by mouth 2 (two) times daily as needed for nausea or vomiting. 20 tablet 0   pantoprazole  (PROTONIX ) 40 MG tablet Take 1 tablet (40 mg total) by mouth daily. 90 tablet 0   rosuvastatin  (CRESTOR ) 40 MG tablet Take 1  tablet (40 mg total) by mouth daily. 90 tablet 1   traMADol  (ULTRAM ) 50 MG tablet Take 1 tablet (50 mg total) by mouth every 12 (twelve) hours as needed. (Patient taking differently: Take 50 mg by mouth every 12 (twelve) hours as needed for moderate pain (pain score 4-6).) 20 tablet 0   trolamine salicylate (ASPERCREME) 10 % cream Apply 1 application topically as needed for muscle pain.     No current facility-administered medications for this visit.    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 Estelita Manas Impression - Normal upper third of esophagus, middle third of esophagus and lower third of esophagus. - Z-line regular, 38 cm from the incisors. - Erythematous mucosa in the antrum. Biopsied. Clip (MR conditional) was placed. - Normal examined duodenum. Biopsied.   12/10/2020 Pathology Results   FINAL MICROSCOPIC DIAGNOSIS:   A. DUODENUM, BIOPSY:  - Duodenal mucosa with no significant pathologic findings.  - Negative for increased intraepithelial lymphocytes and villous  architectural changes.   B. STOMACH, ANTRUM, BIOPSY:  - Reactive gastropathy.  - Warthin-Starry stain is negative for Helicobacter pylori.    12/11/2020 Procedure   Colonoscopy, Dr. Estelita Manas impression - One 5 mm polyp in the transverse colon, removed piecemeal using a cold biopsy forceps. Resected and retrieved. - Diverticulosis in the sigmoid colon, in the descending colon and in the transverse colon. - The examined portion of the ileum was normal. - Non-bleeding internal hemorrhoids.   12/11/2020 Pathology Results   FINAL MICROSCOPIC DIAGNOSIS:   A. COLON, TRANSVERSE, POLYPECTOMY:  - Tubular adenoma.  - Negative for high grade dysplasia.    12/24/2020 Procedure   Capsule endoscopy report: A polypoid nodular growth was noted at 3 hours and 30 minutes and an ulcerated, polypoid growth was noted at 3 hours and 54 minutes which is likely the cause of obscure GI blood loss   01/23/2021 Imaging   CT entero AP w contrast IMPRESSION: 1. No acute findings identified within the abdomen or pelvis. 2. There are several focal areas of intraluminal hyperenhancement within the small bowel loops. The largest is in the nondilated mid to distal jejunum measuring 1.3 cm. Cannot rule out underlying small bowel neoplasm. Correlation with tissue sampling results advised. Correlation with  results from capsule endoscopy. 3. There is a enhancing soft tissue nodule with central calcification within the central small bowel mesentery. This is nonspecific and may represent sequelae of inflammation/infection. Primary differential considerations include carcinoid tumor or metastatic adenopathy. If there is a clinical concern for carcinoid tumor consider further investigation with DOTATATE PET. 4. Left adrenal gland adenoma. 5. Distal colonic diverticulosis without signs of acute diverticulitis. 6. Aortic atherosclerosis.   02/16/2021 PET scan   IMPRESSION: 1. Evidence of well differentiated small bowel neuroendocrine tumor with mesenteric metastasis. 2. Approximately 7 discrete small bowel lesions with intense radiotracer activity. Lesion depicted on comparison CT enterography. 3. Two intensely radiotracer avid mesenteric implants centrally within the small bowel mesentery. 4. Single hepatic metastasis with a second potential hepatic metastasis versus peritoneal implant. 5. No small bowel obstruction. 6. No evidence of metastatic disease  outside the abdomen pelvis. 7. Patient may be a candidate for peptide receptor radiotherapy (Lu-177 DOTATATE) available at [facility] molecular imaging department [phone]).   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 No new or progressive interval findings. 2. Stable appearance of the 17 mm nodule central mesenteric nodule seen to be hypermetabolic on recent PET-CT. The second mesenteric lesion identified on that study is not discernible today. 3. Stable  1.5 cm subcapsular low-density lesion posterior right liver since PET-CT 02/16/2021. 4. 2 small bowel nodules are identified on imaging today although 7 discrete hypermetabolic small bowel lesions were seen on previous PET-CT. 5. Left colonic diverticulosis without diverticulitis. 6. Aortic Atherosclerosis (ICD10-I70.0).   02/22/2022 Imaging   EXAM: CT ABDOMEN AND PELVIS WITH CONTRAST  IMPRESSION: 1. Mural soft tissue nodule of the small bowel in the midline ventral abdomen is unchanged, as is a small adjacent metastatic mesenteric nodule. 2. No evidence of new metastatic disease in the abdomen or pelvis. 3. Diverticulosis without evidence of acute diverticulitis. 4. Coronary artery disease.   08/25/2023 Imaging   CT chest, abdomen, and pelvis with contrast  IMPRESSION: 1. Increased size of the neuroendocrine metastasis measuring 2.1 cm in the right hepatic lobe. 2. Stable enhancing nonobstructive small bowel lesions. 3. Stable mesenteric nodule. 4. Stable tiny soft tissue nodules in the bilateral posterior gluteal subcutaneous space.       REVIEW OF SYSTEMS:   Constitutional: Denies fevers, chills or abnormal weight loss Eyes: Denies blurriness of vision Ears, nose, mouth, throat, and face: Denies mucositis or sore throat Respiratory: Denies cough, dyspnea or wheezes Cardiovascular: Denies palpitation, chest discomfort or lower extremity swelling Gastrointestinal:  Denies nausea. Continues to have soft and narrow caliber stools.  Skin: Denies abnormal skin rashes Lymphatics: Denies new lymphadenopathy or easy bruising Neurological:Denies numbness, tingling or new weaknesses Behavioral/Psych: Mood is stable, no new changes  All other systems were reviewed with the patient and are negative.   VITALS:   Today's Vitals   10/12/24 1035  BP: 136/80  Pulse: 77  Resp: 17  Temp: (!) 97.5 F (36.4 C)  TempSrc: Temporal  SpO2: 98%  Weight: 176 lb 1.6 oz (79.9 kg)  Height:  5' 3 (1.6 m)   Body mass index is 31.19 kg/m.   Wt Readings from Last 3 Encounters:  10/12/24 176 lb 1.6 oz (79.9 kg)  08/30/24 169 lb 6.4 oz (76.8 kg)  08/12/24 169 lb 12.1 oz (77 kg)    Body mass index is 31.19 kg/m.  Performance status (ECOG): 2 - Symptomatic, <50% confined to bed  PHYSICAL EXAM:   GENERAL:alert, no distress and comfortable SKIN: skin color, texture, turgor are normal, no rashes or significant lesions EYES: normal, Conjunctiva are pink and non-injected, sclera clear OROPHARYNX:no exudate, no erythema and lips, buccal mucosa, and tongue normal  NECK: supple, thyroid  normal size, non-tender, without nodularity LYMPH:  no palpable lymphadenopathy in the cervical, axillary or inguinal LUNGS: clear to auscultation and percussion with normal breathing effort HEART: regular rate & rhythm and no murmurs and no lower extremity edema ABDOMEN:abdomen soft, non-tender and normal bowel sounds Musculoskeletal:no cyanosis of digits and no clubbing  NEURO: alert & oriented x 3 with fluent speech, no focal motor/sensory deficits  LABORATORY DATA:  I have reviewed the data as listed    Component Value Date/Time   NA 141 10/12/2024 1015   NA 141 09/03/2019 1439   K 4.1 10/12/2024 1015   CL 105 10/12/2024 1015   CO2 27 10/12/2024 1015   GLUCOSE 126 (H) 10/12/2024 1015   BUN 9 10/12/2024 1015   BUN 12 09/03/2019 1439   CREATININE 0.93 10/12/2024 1015   CALCIUM  9.2 10/12/2024 1015   PROT 6.9 10/12/2024 1015   PROT 6.6 12/25/2019 1005   ALBUMIN 4.1 10/12/2024 1015   ALBUMIN 4.0 12/25/2019 1005   AST 26 10/12/2024 1015   ALT 23 10/12/2024 1015   ALKPHOS 57 10/12/2024 1015   BILITOT 0.4 10/12/2024 1015   GFRNONAA >60 10/12/2024 1015   GFRAA >60 03/04/2020 1210   Lab Results  Component Value Date   WBC 3.7 (L) 10/12/2024   NEUTROABS 2.1 10/12/2024   HGB 11.4 (L) 10/12/2024   HCT 34.7 (L) 10/12/2024   MCV 88.3 10/12/2024   PLT 259 10/12/2024     RADIOGRAPHIC  STUDIES: CT ABDOMEN PELVIS W CONTRAST Result Date: 10/07/2024 CLINICAL DATA:  Small bowel cancer, assess treatment response. * Tracking Code: BO * EXAM: CT ABDOMEN AND PELVIS WITH CONTRAST TECHNIQUE: Multidetector CT imaging of the abdomen and pelvis was performed using the standard protocol following bolus administration of intravenous contrast. RADIATION DOSE REDUCTION: This exam was performed according to the departmental dose-optimization program which includes automated exposure control, adjustment of the mA and/or kV according to patient size and/or use of iterative reconstruction technique. CONTRAST:  100mL OMNIPAQUE  IOHEXOL  300 MG/ML  SOLN COMPARISON:  CT scan abdomen and pelvis from 08/10/2024. FINDINGS: Lower chest: The lung bases are clear. No pleural effusion. The heart is normal in size. No pericardial effusion. Hepatobiliary: The liver is normal in size. Non-cirrhotic configuration. Redemonstration of subcapsular approximately 1.6 x 2.4 cm hypoattenuating lesion in the right hepatic dome, segment 6/7, which is similar to the recent prior study and was FDG avid on the prior PET-CT scan, compatible with stable metastatic lesion. No intrahepatic or extrahepatic bile duct dilation. No calcified gallstones. Normal gallbladder wall thickness. No pericholecystic inflammatory changes. Pancreas: Unremarkable. No pancreatic ductal dilatation or surrounding inflammatory changes. Spleen: Within normal limits. No focal lesion. Adrenals/Urinary Tract: Stable 9 x 10 mm left adrenal nodule, which was not tracer avid on prior PET-CT scan. Unremarkable right adrenal nodule. No suspicious renal mass. No hydronephrosis. No renal or ureteric calculi. Unremarkable urinary bladder. Stomach/Bowel: No disproportionate dilation of the small or large bowel loops. No evidence of abnormal bowel wall thickening or inflammatory changes. The appendix is unremarkable. There are multiple diverticula mainly in the sigmoid colon,  Without imaging signs of diverticulitis. Vascular/Lymphatic: No ascites or pneumoperitoneum. There are several soft tissue attenuation nodules in the central mesentery (series 2, images 38, 39,) which are grossly similar to the prior exam with a dominant 8 x 10 mm nodule exhibiting central dystrophic punctate calcification. These nodules were tracer avid on the prior PET-CT scan but appears decreased in size since then and favored stable treated metastatic lymph nodes. There are 2 well-circumscribed soft tissue attenuation nodules in the left lower abdomen (series 2, images 46, 52), Which are essentially unchanged since the prior study and were non tracer avid on the prior PET-CT scan favoring benign etiology such as detached epiploic appendagitis. No new mesenteric, omental or peritoneal deposits seen. No  aneurysmal dilation of the major abdominal arteries. There are moderate peripheral atherosclerotic vascular calcifications of the aorta and its major branches. Reproductive: The uterus is surgically absent. No large adnexal mass. Other: The visualized soft tissues and abdominal wall are unremarkable. Musculoskeletal: No suspicious osseous lesions. There are mild multilevel degenerative changes in the visualized spine. IMPRESSION: 1. Essentially stable exam. 2. Stable appearance of solitary subcapsular hypoattenuating lesion in the right hepatic dome. 3. Stable appearance of soft tissue attenuation nodules in the central mesentery. 4. Stable 9 x 10 mm left adrenal nodule which was non tracer avid on prior PET-CT scan. 5. No new metastatic disease identified within the abdomen or pelvis. Aortic Atherosclerosis (ICD10-I70.0). Electronically Signed   By: Ree Molt M.D.   On: 10/07/2024 12:16   "

## 2024-10-12 ENCOUNTER — Inpatient Hospital Stay: Admitting: Nurse Practitioner

## 2024-10-12 ENCOUNTER — Inpatient Hospital Stay

## 2024-10-12 ENCOUNTER — Inpatient Hospital Stay: Attending: Hematology

## 2024-10-12 ENCOUNTER — Encounter: Payer: Self-pay | Admitting: Hematology

## 2024-10-12 VITALS — BP 136/80 | HR 77 | Temp 97.5°F | Resp 17 | Ht 63.0 in | Wt 176.1 lb

## 2024-10-12 DIAGNOSIS — C7B8 Other secondary neuroendocrine tumors: Secondary | ICD-10-CM | POA: Diagnosis present

## 2024-10-12 DIAGNOSIS — C7A8 Other malignant neuroendocrine tumors: Secondary | ICD-10-CM

## 2024-10-12 DIAGNOSIS — Z79899 Other long term (current) drug therapy: Secondary | ICD-10-CM | POA: Diagnosis not present

## 2024-10-12 LAB — CBC WITH DIFFERENTIAL (CANCER CENTER ONLY)
Abs Immature Granulocytes: 0 10*3/uL (ref 0.00–0.07)
Basophils Absolute: 0 10*3/uL (ref 0.0–0.1)
Basophils Relative: 1 %
Eosinophils Absolute: 0.2 10*3/uL (ref 0.0–0.5)
Eosinophils Relative: 5 %
HCT: 34.7 % — ABNORMAL LOW (ref 36.0–46.0)
Hemoglobin: 11.4 g/dL — ABNORMAL LOW (ref 12.0–15.0)
Immature Granulocytes: 0 %
Lymphocytes Relative: 30 %
Lymphs Abs: 1.1 10*3/uL (ref 0.7–4.0)
MCH: 29 pg (ref 26.0–34.0)
MCHC: 32.9 g/dL (ref 30.0–36.0)
MCV: 88.3 fL (ref 80.0–100.0)
Monocytes Absolute: 0.4 10*3/uL (ref 0.1–1.0)
Monocytes Relative: 9 %
Neutro Abs: 2.1 10*3/uL (ref 1.7–7.7)
Neutrophils Relative %: 55 %
Platelet Count: 259 10*3/uL (ref 150–400)
RBC: 3.93 MIL/uL (ref 3.87–5.11)
RDW: 12.7 % (ref 11.5–15.5)
WBC Count: 3.7 10*3/uL — ABNORMAL LOW (ref 4.0–10.5)
nRBC: 0 % (ref 0.0–0.2)

## 2024-10-12 LAB — CMP (CANCER CENTER ONLY)
ALT: 23 U/L (ref 0–44)
AST: 26 U/L (ref 15–41)
Albumin: 4.1 g/dL (ref 3.5–5.0)
Alkaline Phosphatase: 57 U/L (ref 38–126)
Anion gap: 9 (ref 5–15)
BUN: 9 mg/dL (ref 8–23)
CO2: 27 mmol/L (ref 22–32)
Calcium: 9.2 mg/dL (ref 8.9–10.3)
Chloride: 105 mmol/L (ref 98–111)
Creatinine: 0.93 mg/dL (ref 0.44–1.00)
GFR, Estimated: >60 mL/min
Glucose, Bld: 126 mg/dL — ABNORMAL HIGH (ref 70–99)
Potassium: 4.1 mmol/L (ref 3.5–5.1)
Sodium: 141 mmol/L (ref 135–145)
Total Bilirubin: 0.4 mg/dL (ref 0.0–1.2)
Total Protein: 6.9 g/dL (ref 6.5–8.1)

## 2024-10-12 MED ORDER — OCTREOTIDE ACETATE 30 MG IM KIT
30.0000 mg | PACK | Freq: Once | INTRAMUSCULAR | Status: AC
  Administered 2024-10-12: 30 mg via INTRAMUSCULAR
  Filled 2024-10-12: qty 1

## 2024-10-14 ENCOUNTER — Encounter: Payer: Self-pay | Admitting: Nurse Practitioner

## 2024-10-14 ENCOUNTER — Encounter: Payer: Self-pay | Admitting: Hematology

## 2024-10-15 LAB — CHROMOGRANIN A: Chromogranin A (ng/mL): 42.5 ng/mL (ref 0.0–101.8)

## 2024-10-19 ENCOUNTER — Telehealth (HOSPITAL_COMMUNITY): Payer: Self-pay

## 2024-10-19 NOTE — Telephone Encounter (Signed)
 Pt insurance is active and benefits verified through [facility] Medicare. Co-pay $0, DED $0/$0 met, out of pocket $4,200/$0 met, co-insurance 0%. No pre-authorization required. Passport, 10/19/2024 @ 9:00am, REF# [id number].  TCR/ICR? ICR Visit(date of service)limitation? No Can multiple codes be used on the same date of service/visit?(IF ITS A LIMIT) N/A  Is this a lifetime maximum or an annual maximum? Annual Has the member used any of these services to date? No Is there a time limit (weeks/months) on start of program and/or program completion? No

## 2024-10-19 NOTE — Telephone Encounter (Signed)
 Attempted to schedule cardiac rehab- no answer, left message. Sent MyChart message.

## 2024-11-29 ENCOUNTER — Ambulatory Visit: Admitting: Cardiology
